# Patient Record
Sex: Male | Born: 1940 | Race: White | Hispanic: No | Marital: Married | Smoking: Never smoker
Health system: Southern US, Community
[De-identification: ages and names within clinical notes are randomized; demographics above are authoritative.]

## PROBLEM LIST (undated history)

## (undated) DIAGNOSIS — E119 Type 2 diabetes mellitus without complications: Secondary | ICD-10-CM

## (undated) DIAGNOSIS — N400 Enlarged prostate without lower urinary tract symptoms: Secondary | ICD-10-CM

## (undated) DIAGNOSIS — R519 Headache, unspecified: Secondary | ICD-10-CM

## (undated) DIAGNOSIS — Z9109 Other allergy status, other than to drugs and biological substances: Secondary | ICD-10-CM

## (undated) DIAGNOSIS — E039 Hypothyroidism, unspecified: Secondary | ICD-10-CM

## (undated) DIAGNOSIS — E785 Hyperlipidemia, unspecified: Secondary | ICD-10-CM

## (undated) DIAGNOSIS — M1990 Unspecified osteoarthritis, unspecified site: Secondary | ICD-10-CM

## (undated) DIAGNOSIS — R51 Headache: Secondary | ICD-10-CM

## (undated) DIAGNOSIS — N390 Urinary tract infection, site not specified: Secondary | ICD-10-CM

## (undated) HISTORY — PX: CHOLECYSTECTOMY: SHX55

## (undated) HISTORY — DX: Other allergy status, other than to drugs and biological substances: Z91.09

## (undated) HISTORY — DX: Headache: R51

## (undated) HISTORY — DX: Type 2 diabetes mellitus without complications: E11.9

## (undated) HISTORY — PX: CARDIAC CATHETERIZATION: SHX172

## (undated) HISTORY — DX: Benign prostatic hyperplasia without lower urinary tract symptoms: N40.0

## (undated) HISTORY — DX: Headache, unspecified: R51.9

## (undated) HISTORY — DX: Hyperlipidemia, unspecified: E78.5

---

## 1999-10-28 ENCOUNTER — Other Ambulatory Visit: Admission: RE | Admit: 1999-10-28 | Discharge: 1999-10-28 | Payer: Self-pay | Admitting: Otolaryngology

## 2002-04-21 ENCOUNTER — Ambulatory Visit (HOSPITAL_COMMUNITY): Admission: RE | Admit: 2002-04-21 | Discharge: 2002-04-21 | Payer: Self-pay | Admitting: Cardiology

## 2011-09-08 ENCOUNTER — Other Ambulatory Visit: Payer: Self-pay | Admitting: Interventional Cardiology

## 2011-09-11 ENCOUNTER — Inpatient Hospital Stay (HOSPITAL_BASED_OUTPATIENT_CLINIC_OR_DEPARTMENT_OTHER)
Admission: RE | Admit: 2011-09-11 | Discharge: 2011-09-11 | Disposition: A | Discharge status: Home / Self Care | Payer: Medicare Other | Source: Ambulatory Visit | Attending: Interventional Cardiology | Admitting: Interventional Cardiology

## 2011-09-11 ENCOUNTER — Encounter (HOSPITAL_BASED_OUTPATIENT_CLINIC_OR_DEPARTMENT_OTHER)
Admission: RE | Disposition: A | Discharge status: Home / Self Care | Payer: Self-pay | Source: Ambulatory Visit | Attending: Interventional Cardiology

## 2011-09-11 DIAGNOSIS — R079 Chest pain, unspecified: Secondary | ICD-10-CM | POA: Insufficient documentation

## 2011-09-11 DIAGNOSIS — I2510 Atherosclerotic heart disease of native coronary artery without angina pectoris: Secondary | ICD-10-CM | POA: Insufficient documentation

## 2011-09-11 DIAGNOSIS — E119 Type 2 diabetes mellitus without complications: Secondary | ICD-10-CM | POA: Insufficient documentation

## 2011-09-11 SURGERY — JV LEFT HEART CATHETERIZATION WITH CORONARY ANGIOGRAM
Anesthesia: Moderate Sedation

## 2011-09-11 MED ORDER — SODIUM CHLORIDE 0.9 % IJ SOLN: 3.0000 mL | Freq: Two times a day (BID) | INTRAMUSCULAR | Status: DC

## 2011-09-11 MED ORDER — SODIUM CHLORIDE 0.9 % IJ SOLN: 3.0000 mL | INTRAMUSCULAR | Status: DC | PRN

## 2011-09-11 MED ORDER — ACETAMINOPHEN 325 MG PO TABS: 650.0000 mg | ORAL_TABLET | ORAL | Status: DC | PRN

## 2011-09-11 MED ORDER — SODIUM CHLORIDE 0.9 % IV SOLN: 250.0000 mL | INTRAVENOUS | Status: DC | PRN

## 2011-09-11 MED ORDER — SODIUM CHLORIDE 0.9 % IV SOLN: INTRAVENOUS | Status: AC

## 2011-09-11 MED ORDER — DIAZEPAM 5 MG PO TABS
5.0000 mg | ORAL_TABLET | ORAL | Status: AC
  Administered 2011-09-11: 5 mg via ORAL

## 2011-09-11 MED ORDER — ONDANSETRON HCL 4 MG/2ML IJ SOLN: 4.0000 mg | Freq: Four times a day (QID) | INTRAMUSCULAR | Status: DC | PRN

## 2011-09-11 MED ORDER — ASPIRIN 81 MG PO CHEW
324.0000 mg | CHEWABLE_TABLET | ORAL | Status: AC
  Administered 2011-09-11: 324 mg via ORAL

## 2011-09-11 MED ORDER — MORPHINE SULFATE 2 MG/ML IJ SOLN: 1.0000 mg | INTRAMUSCULAR | Status: DC | PRN

## 2011-09-11 MED ORDER — ASPIRIN 81 MG PO CHEW: 81.0000 mg | CHEWABLE_TABLET | Freq: Every day | ORAL | Status: DC

## 2011-09-11 MED ORDER — SODIUM CHLORIDE 0.9 % IV SOLN
INTRAVENOUS | Status: DC
  Administered 2011-09-11: 08:00:00 via INTRAVENOUS

## 2011-09-11 NOTE — H&P (Signed)
  Date of Initial H&P:09/01/11  History reviewed, patient examined, no change in status, stable for surgery.

## 2011-09-11 NOTE — Progress Notes (Signed)
Discharge instructions completed, ambulated to bathroom without bleeding from right groin site, discharged to home via wheelchair with wife. 

## 2011-09-11 NOTE — Progress Notes (Signed)
Bedrest begins @ 0920, Dr. Eldridge Dace in to discuss results with patient and wife.

## 2011-09-11 NOTE — CV Procedure (Signed)
PROCEDURE:  Left heart catheterization with selective coronary angiography, left ventriculogram.  Abdominal aortogram.  INDICATIONS:  Persistent chest pain.  Diabetes.  The risks, benefits, and details of the procedure were explained to the patient.  The patient verbalized understanding and wanted to proceed.  Informed written consent was obtained.  PROCEDURE TECHNIQUE:  After Xylocaine anesthesia a 38F sheath was placed in the right femoral artery with a single anterior needle wall stick.   Left coronary angiography was done using a Judkins L4 guide catheter.  Right coronary angiography was done using a 3 [facility] guide catheter.  Left ventriculography and abdominal aortogram were done using a pigtail catheter.    CONTRAST:  Total of 90 cc.  COMPLICATIONS:  None.    HEMODYNAMICS:  Aortic pressure was 119/72; LV pressure was 117/16; LVEDP 19.  There was no gradient between the left ventricle and aorta.    ANGIOGRAPHIC DATA:   The left main coronary artery is widely patent.  The left anterior descending artery is a large vessel which wraps around the apex.  There is mild atherosclerosis in the proximal to midportion.  The first diagonal is a medium-sized vessel and widely patent.  The second diagonal is small and patent.  The left circumflex artery is a medium-sized vessel.  There is a small OM1 which is patent.  The OM 2 is medium-sized which is widely patent.  There are 3 additional very small OM's coming off the distal circumflex which is fairly small.  There are only mild luminal irregularities in the circumflex.  The right coronary artery is a large dominant vessel.  There are mild luminal irregularities in the midportion the vessel.  The PDA is small but patent.  The posterior lateral artery is a larger vessel supplying the lateral wall.  There is no significant disease in the right coronary system.  LEFT VENTRICULOGRAM:  Left ventricular angiogram was done in the 30 RAO projection and  revealed normal left ventricular wall motion and systolic function with an estimated ejection fraction of 60 %.  LVEDP was 19 mmHg.  IMPRESSIONS:  1. Normal left main coronary artery. 2. Minimal atherosclerosis in the left anterior descending artery and its branches. 3. Minimal atherosclerosis in the left circumflex artery and its branches. 4. Minimal atherosclerosis in the right coronary artery. 5. Normal left ventricular systolic function.  LVEDP 19 mmHg.  Ejection fraction 60 %.  RECOMMENDATION:  No significant coronary artery disease.  Continue aggressive preventive therapy.  He will benefit from weight loss and continued aggressive diabetes control.

## 2011-09-12 LAB — POCT I-STAT GLUCOSE
Glucose, Bld: 207 mg/dL — ABNORMAL HIGH (ref 70–99)
Operator id: 118031

## 2013-11-14 DIAGNOSIS — B0230 Zoster ocular disease, unspecified: Secondary | ICD-10-CM | POA: Insufficient documentation

## 2013-12-13 ENCOUNTER — Ambulatory Visit: Payer: Self-pay | Admitting: Family Medicine

## 2014-02-17 DIAGNOSIS — J302 Other seasonal allergic rhinitis: Secondary | ICD-10-CM | POA: Insufficient documentation

## 2014-11-22 ENCOUNTER — Encounter: Payer: Self-pay | Admitting: *Deleted

## 2015-02-08 ENCOUNTER — Other Ambulatory Visit: Payer: Self-pay | Admitting: Unknown Physician Specialty

## 2015-02-08 DIAGNOSIS — M25561 Pain in right knee: Secondary | ICD-10-CM

## 2015-02-08 DIAGNOSIS — M1711 Unilateral primary osteoarthritis, right knee: Secondary | ICD-10-CM

## 2015-02-15 ENCOUNTER — Ambulatory Visit
Admission: RE | Admit: 2015-02-15 | Discharge: 2015-02-15 | Disposition: A | Discharge status: Home / Self Care | Payer: Medicare Other | Source: Ambulatory Visit | Attending: Unknown Physician Specialty | Admitting: Unknown Physician Specialty

## 2015-02-15 DIAGNOSIS — M1711 Unilateral primary osteoarthritis, right knee: Secondary | ICD-10-CM | POA: Insufficient documentation

## 2015-02-15 DIAGNOSIS — M659 Synovitis and tenosynovitis, unspecified: Secondary | ICD-10-CM | POA: Insufficient documentation

## 2015-02-15 DIAGNOSIS — S83241A Other tear of medial meniscus, current injury, right knee, initial encounter: Secondary | ICD-10-CM | POA: Insufficient documentation

## 2015-02-15 DIAGNOSIS — M25461 Effusion, right knee: Secondary | ICD-10-CM | POA: Diagnosis not present

## 2015-02-15 DIAGNOSIS — M25561 Pain in right knee: Secondary | ICD-10-CM

## 2015-03-08 DIAGNOSIS — S83209A Unspecified tear of unspecified meniscus, current injury, unspecified knee, initial encounter: Secondary | ICD-10-CM | POA: Insufficient documentation

## 2015-03-09 NOTE — Anesthesia Preprocedure Evaluation (Addendum)
Anesthesia Evaluation    Airway Mallampati: II  TM Distance: >3 FB Neck ROM: Full    Dental no notable dental hx.    Pulmonary  breath sounds clear to auscultation  Pulmonary exam normal       Cardiovascular Normal cardiovascular examRhythm:Regular Rate:Normal  Hyperlipidemia  Left heart cath 2013:  1.   Normal left main coronary artery. 2.   Minimal atherosclerosis in the left anterior descending artery and its branches. 3.   Minimal atherosclerosis in the left circumflex artery and its branches. 4.   Minimal atherosclerosis in the right coronary artery. 5.   Normal left ventricular systolic function. LVEDP 19 mmHg. Ejection fraction 60 %.    Neuro/Psych  Headaches,    GI/Hepatic   Endo/Other  diabetes, Type 2  Renal/GU      Musculoskeletal  (+) Arthritis -,   Abdominal   Peds  Hematology   Anesthesia Other Findings   Reproductive/Obstetrics                           Anesthesia Physical Anesthesia Plan  ASA: II  Anesthesia Plan: General   Post-op Pain Management:    Induction: Intravenous  Airway Management Planned:   Additional Equipment:   Intra-op Plan:   Post-operative Plan: Extubation in OR  Informed Consent: I have reviewed the patients History and Physical, chart, labs and discussed the procedure including the risks, benefits and alternatives for the proposed anesthesia with the patient or authorized representative who has indicated his/her understanding and acceptance.   Dental advisory given  Plan Discussed with: CRNA  Anesthesia Plan Comments:         Anesthesia Quick Evaluation

## 2015-03-16 ENCOUNTER — Encounter: Payer: Self-pay | Admitting: *Deleted

## 2015-03-16 ENCOUNTER — Ambulatory Visit: Payer: Medicare Other | Admitting: Anesthesiology

## 2015-03-16 ENCOUNTER — Encounter
Admission: RE | Disposition: A | Discharge status: Home / Self Care | Payer: Self-pay | Source: Ambulatory Visit | Attending: Unknown Physician Specialty

## 2015-03-16 ENCOUNTER — Ambulatory Visit
Admission: RE | Admit: 2015-03-16 | Discharge: 2015-03-16 | Disposition: A | Discharge status: Home / Self Care | Payer: Medicare Other | Source: Ambulatory Visit | Attending: Unknown Physician Specialty | Admitting: Unknown Physician Specialty

## 2015-03-16 DIAGNOSIS — Z86010 Personal history of colonic polyps: Secondary | ICD-10-CM | POA: Insufficient documentation

## 2015-03-16 DIAGNOSIS — Z7982 Long term (current) use of aspirin: Secondary | ICD-10-CM | POA: Insufficient documentation

## 2015-03-16 DIAGNOSIS — K589 Irritable bowel syndrome without diarrhea: Secondary | ICD-10-CM | POA: Diagnosis not present

## 2015-03-16 DIAGNOSIS — I7090 Unspecified atherosclerosis: Secondary | ICD-10-CM | POA: Insufficient documentation

## 2015-03-16 DIAGNOSIS — Z803 Family history of malignant neoplasm of breast: Secondary | ICD-10-CM | POA: Diagnosis not present

## 2015-03-16 DIAGNOSIS — R51 Headache: Secondary | ICD-10-CM | POA: Insufficient documentation

## 2015-03-16 DIAGNOSIS — E119 Type 2 diabetes mellitus without complications: Secondary | ICD-10-CM | POA: Diagnosis not present

## 2015-03-16 DIAGNOSIS — M25561 Pain in right knee: Secondary | ICD-10-CM | POA: Diagnosis present

## 2015-03-16 DIAGNOSIS — Z9049 Acquired absence of other specified parts of digestive tract: Secondary | ICD-10-CM | POA: Insufficient documentation

## 2015-03-16 DIAGNOSIS — E785 Hyperlipidemia, unspecified: Secondary | ICD-10-CM | POA: Insufficient documentation

## 2015-03-16 DIAGNOSIS — Z79899 Other long term (current) drug therapy: Secondary | ICD-10-CM | POA: Diagnosis not present

## 2015-03-16 DIAGNOSIS — M23231 Derangement of other medial meniscus due to old tear or injury, right knee: Secondary | ICD-10-CM | POA: Insufficient documentation

## 2015-03-16 DIAGNOSIS — Z833 Family history of diabetes mellitus: Secondary | ICD-10-CM | POA: Insufficient documentation

## 2015-03-16 DIAGNOSIS — Z841 Family history of disorders of kidney and ureter: Secondary | ICD-10-CM | POA: Insufficient documentation

## 2015-03-16 DIAGNOSIS — M1990 Unspecified osteoarthritis, unspecified site: Secondary | ICD-10-CM | POA: Insufficient documentation

## 2015-03-16 DIAGNOSIS — J309 Allergic rhinitis, unspecified: Secondary | ICD-10-CM | POA: Insufficient documentation

## 2015-03-16 HISTORY — PX: KNEE ARTHROSCOPY: SHX127

## 2015-03-16 HISTORY — DX: Unspecified osteoarthritis, unspecified site: M19.90

## 2015-03-16 LAB — GLUCOSE, CAPILLARY
Glucose-Capillary: 132 mg/dL — ABNORMAL HIGH (ref 65–99)
Glucose-Capillary: 140 mg/dL — ABNORMAL HIGH (ref 65–99)

## 2015-03-16 SURGERY — ARTHROSCOPY, KNEE
Anesthesia: General | Laterality: Right | Wound class: Clean

## 2015-03-16 MED ORDER — LACTATED RINGERS IR SOLN
Status: DC | PRN
  Administered 2015-03-16: 7100 mL

## 2015-03-16 MED ORDER — FENTANYL CITRATE (PF) 100 MCG/2ML IJ SOLN
INTRAMUSCULAR | Status: DC | PRN
  Administered 2015-03-16 (×5): 25 ug via INTRAVENOUS
  Administered 2015-03-16: 50 ug via INTRAVENOUS

## 2015-03-16 MED ORDER — OXYCODONE HCL 5 MG PO TABS: 5.0000 mg | ORAL_TABLET | Freq: Once | ORAL | Status: DC | PRN

## 2015-03-16 MED ORDER — LIDOCAINE HCL (CARDIAC) 20 MG/ML IV SOLN
INTRAVENOUS | Status: DC | PRN
  Administered 2015-03-16: 50 mg via INTRATRACHEAL

## 2015-03-16 MED ORDER — NORCO 5-325 MG PO TABS: 1.0000 | ORAL_TABLET | Freq: Four times a day (QID) | ORAL | Status: DC | PRN

## 2015-03-16 MED ORDER — DEXAMETHASONE SODIUM PHOSPHATE 4 MG/ML IJ SOLN
INTRAMUSCULAR | Status: DC | PRN
  Administered 2015-03-16: 4 mg via INTRAVENOUS

## 2015-03-16 MED ORDER — HYDROMORPHONE HCL 1 MG/ML IJ SOLN: 0.2500 mg | INTRAMUSCULAR | Status: DC | PRN

## 2015-03-16 MED ORDER — PROPOFOL 10 MG/ML IV BOLUS
INTRAVENOUS | Status: DC | PRN
  Administered 2015-03-16: 130 mg via INTRAVENOUS

## 2015-03-16 MED ORDER — MIDAZOLAM HCL 5 MG/5ML IJ SOLN
INTRAMUSCULAR | Status: DC | PRN
  Administered 2015-03-16: 2 mg via INTRAVENOUS

## 2015-03-16 MED ORDER — ONDANSETRON HCL 4 MG/2ML IJ SOLN
INTRAMUSCULAR | Status: DC | PRN
  Administered 2015-03-16: 4 mg via INTRAVENOUS

## 2015-03-16 MED ORDER — OXYCODONE HCL 5 MG/5ML PO SOLN: 5.0000 mg | Freq: Once | ORAL | Status: DC | PRN

## 2015-03-16 MED ORDER — GLYCOPYRROLATE 0.2 MG/ML IJ SOLN
INTRAMUSCULAR | Status: DC | PRN
  Administered 2015-03-16: .1 mg via INTRAVENOUS

## 2015-03-16 MED ORDER — BUPIVACAINE HCL (PF) 0.5 % IJ SOLN
INTRAMUSCULAR | Status: DC | PRN
  Administered 2015-03-16: 20 mL

## 2015-03-16 MED ORDER — LACTATED RINGERS IV SOLN
INTRAVENOUS | Status: DC
  Administered 2015-03-16: 09:00:00 via INTRAVENOUS

## 2015-03-16 SURGICAL SUPPLY — 41 items
ARTHROWAND PARAGON T2 (SURGICAL WAND) ×3
BLADE ABRADER 4.5 (BLADE) ×2 IMPLANT
BLADE SHAVER 4.5X7 STR FR (MISCELLANEOUS) ×2 IMPLANT
BUR RADIUS 3.5 (BURR) IMPLANT
BUR RADIUS 4.0X18.5 (BURR) IMPLANT
BUR ROUND 5.5 (BURR) IMPLANT
BURR ROUND 12 FLUTE 4.0MM (BURR) IMPLANT
COVER LIGHT HANDLE FLEXIBLE (MISCELLANEOUS) ×3 IMPLANT
CUFF TOURN SGL QUICK 24 (TOURNIQUET CUFF) ×3
CUFF TOURN SGL QUICK 30 (MISCELLANEOUS)
CUFF TOURN SGL QUICK 34 (TOURNIQUET CUFF)
CUFF TRNQT CYL 24X4X40X1 (TOURNIQUET CUFF) IMPLANT
CUFF TRNQT CYL 34X4X40X1 (TOURNIQUET CUFF) IMPLANT
CUFF TRNQT CYL LO 30X4X (MISCELLANEOUS) IMPLANT
CUTTER SLOTTED WHISKER 4.0 (BURR) IMPLANT
DRAPE LEGGINS SURG 28X43 STRL (DRAPES) ×3 IMPLANT
DURAPREP 26ML APPLICATOR (WOUND CARE) ×3 IMPLANT
GAUZE SPONGE 4X4 12PLY STRL (GAUZE/BANDAGES/DRESSINGS) ×3 IMPLANT
GLOVE BIO SURGEON STRL SZ7.5 (GLOVE) ×5 IMPLANT
GLOVE BIO SURGEON STRL SZ8 (GLOVE) ×3 IMPLANT
GLOVE INDICATOR 8.0 STRL GRN (GLOVE) ×5 IMPLANT
GOWN STRL REIN 2XL XLG LVL4 (GOWN DISPOSABLE) ×3 IMPLANT
GOWN STRL REUS W/TWL 2XL LVL3 (GOWN DISPOSABLE) ×3 IMPLANT
IV LACTATED RINGER IRRG 3000ML (IV SOLUTION) ×6
IV LR IRRIG 3000ML ARTHROMATIC (IV SOLUTION) ×2 IMPLANT
MANIFOLD 4PT FOR NEPTUNE1 (MISCELLANEOUS) ×3 IMPLANT
PACK ARTHROSCOPY KNEE (MISCELLANEOUS) ×3 IMPLANT
SET TUBE SUCT SHAVER OUTFL 24K (TUBING) ×3 IMPLANT
SOL PREP PVP 2OZ (MISCELLANEOUS) ×3
SOLUTION PREP PVP 2OZ (MISCELLANEOUS) ×1 IMPLANT
SUT ETHILON 3-0 FS-10 30 BLK (SUTURE) ×3
SUTURE EHLN 3-0 FS-10 30 BLK (SUTURE) ×1 IMPLANT
TAPE MICROFOAM 4IN (TAPE) ×3 IMPLANT
TUBING ARTHRO INFLOW-ONLY STRL (TUBING) ×3 IMPLANT
WAND ARTHRO PARAGON T2 (SURGICAL WAND) IMPLANT
WAND COVAC 50 IFS (MISCELLANEOUS) IMPLANT
WAND HAND CNTRL MULTIVAC 50 (MISCELLANEOUS) ×2 IMPLANT
WAND HAND CNTRL MULTIVAC 90 (MISCELLANEOUS) IMPLANT
WAND MEGAVAC 90 (MISCELLANEOUS) IMPLANT
WAND ULTRAVAC 90 (MISCELLANEOUS) IMPLANT
WRAP KNEE W/COLD PACKS 25.5X14 (SOFTGOODS) ×2 IMPLANT

## 2015-03-16 NOTE — Discharge Instructions (Signed)
General Anesthesia, Care After °Refer to this sheet in the next few weeks. These instructions provide you with information on caring for yourself after your procedure. Your health care provider may also give you more specific instructions. Your treatment has been planned according to current medical practices, but problems sometimes occur. Call your health care provider if you have any problems or questions after your procedure. °WHAT TO EXPECT AFTER THE PROCEDURE °After the procedure, it is typical to experience: °· Sleepiness. °· Nausea and vomiting. °HOME CARE INSTRUCTIONS °· For the first 24 hours after general anesthesia: °¨ Have a responsible person with you. °¨ Do not drive a car. If you are alone, do not take public transportation. °¨ Do not drink alcohol. °¨ Do not take medicine that has not been prescribed by your health care provider. °¨ Do not sign important papers or make important decisions. °¨ You may resume a normal diet and activities as directed by your health care provider. °· Change bandages (dressings) as directed. °· If you have questions or problems that seem related to general anesthesia, call the hospital and ask for the anesthetist or anesthesiologist on call. °SEEK MEDICAL CARE IF: °· You have nausea and vomiting that continue the day after anesthesia. °· You develop a rash. °SEEK IMMEDIATE MEDICAL CARE IF:  °· You have difficulty breathing. °· You have chest pain. °· You have any allergic problems. °Document Released: 10/06/2000 Document Revised: 07/05/2013 Document Reviewed: 01/13/2013 °ExitCare® Patient Information ©2015 [employer]. This information is not intended to replace advice given to you by your health care provider. Make sure you discuss any questions you have with your health care provider. ° ° °[facility]  °Alizzon Dioguardi B. Kregg Cihlar, Jr., M.D. [phone]  ° °KNEE ARTHROSCOPY POST OPERATION INSTRUCTIONS: ° °PLEASE READ THESE INSTRUCTIONS  ABOUT POST OPERATION CARE. THEY WILL ANSWER MOST OF YOUR QUESTIONS.  °You have been given a prescription for pain. Please take as directed for pain.  °You can walk, keeping the knee slightly stiff-avoid doing too much bending the first day. (if ACL reconstruction is performed, keep brace locked in extension when walking.)  °You will use crutches or cane if needed. Can weight bear as tolerated  °Plan to take three to four days off from work. You can resume work when you are comfortable. (This can be a week or more, depending on the type of work you do.)  °To reduce pain and swelling, place one to two pillows under the knee the first two or three days when sitting or lying. An ice pack may be placed on top of the area over the dressing. Instructions for making homemade icepack are as follow:  °Flexible homemade alcohol water ice pack  °2 cups water  °1 cup rubbing alcohol  °food coloring for the blue tint (optional)  °2 zip-top bags - gallon-size  °Mix the water and alcohol together in one of your zip-top bags and add food coloring. Release as much air as possible and seal the bag. Place in freezer for at least 12 hours.  °The small incisions in your knee are closed with nylon stitches. They will be removed in the office.  °The bulky dressing may be removed in the third day after surgery. (If ACL surgery-DO NOT REMOVE BANDAGES). Put a waterproof band-aid over each stitch. Do not put any creams or ointments on wounds. You may shower at this time, but change waterproof band-aids after showering. KEEP INCISIONS CLEAN AND DRY UNTIL YOU RETURN TO   THE OFFICE.  °Sometimes the operative area remains somewhat painful and swollen for several weeks. This is usually nothing to worry about, but call if you have any excessive symptoms, especially fever. It is not unusual to have a low grade fever of 99 degrees for the first few days. If persist after 3-4 days call the office. It is not uncommon for the pain to be a little worse on  the third day after surgery.  °Begin doing gentle exercises right away. They will be limited by the amount of pain and swelling you have.  Exercising will reduce the swelling, increase motion, and prevent muscle weakness. Exercises: Straight leg raising and gentle knee bending.  °Take 81 milligram aspirin twice a day for 2 weeks after meals or milk. This along with elevation will help reduce the possibility of phlebitis in your operated leg.  °Avoid strenuous athletics for a minimum of 4 to 6 weeks after arthroscopic surgery (approximately five months if ACL surgery).  °If the surgery included ACL reconstruction the brace that is supplied to the extremity post surgery is to be locked in extension when you are asleep and is to be locked in extension when you are ambulating. It can be unlocked for exercises or sitting.  °Keep your post surgery appointment that has been made for you. If you do not remember the date call [phone]. Your follow up appointment should be between 7-10 days.  ° °

## 2015-03-16 NOTE — Transfer of Care (Signed)
Immediate Anesthesia Transfer of Care Note  Patient: Philip Robbins  Procedure(s) Performed: Procedure(s) with comments: ARTHROSCOPY KNEE WITH PARTIAL MEDIAL MENISECTOMY, AND CHONDROPLASTY (Right) - Diabetic - oral meds  Patient Location: PACU  Anesthesia Type: General  Level of Consciousness: awake, alert  and patient cooperative  Airway and Oxygen Therapy: Patient Spontanous Breathing and Patient connected to supplemental oxygen  Post-op Assessment: Post-op Vital signs reviewed, Patient's Cardiovascular Status Stable, Respiratory Function Stable, Patent Airway and No signs of Nausea or vomiting  Post-op Vital Signs: Reviewed and stable  Complications: No apparent anesthesia complications

## 2015-03-16 NOTE — Op Note (Signed)
Patient: Philip Robbins  Preoperative diagnosis: Torn medial meniscus right knee plus medial compartment chondral changes  Postop diagnosis: Same  Operation: Arthroscopic partial medial meniscectomy plus debridement and Coblation of medial femoral chondral lesion  Surgeon: Vilinda Flake, MD  Anesthesia: Gen.   History: Patient's had a long history of right knee pain.  The plain films revealed mild narrowing of the medial compartment .  The patient had an MRI which revealed torn medial meniscus and medial compartment chondral changes.The patient was scheduled for surgery due to persistent discomfort despite conservative treatment.  The patient was taken the operating room where satisfactory general anesthesia was achieved. A tourniquet and leg holder were was applied to the right thigh. The right lower extremity was supported with a well leg holder. The right knee was prepped and draped in usual fashion for an arthroscopic procedure. An inflow cannula was introduced superomedially. The joint was distended with lactated Ringer's. Scope was introduced through an inferolateral puncture wound and a probe through an inferomedial puncture wound. Inspection of the medial compartment revealed  ruptured bucket handle tear of the medial meniscus along with a grade 3 medial femoral chondral lesion. It measured about 1.5 to 2 cm in diameter. I went ahead and excised the ruptured bucket handle tear of the medial meniscus and then contoured the remaining rim with a motorized resector and an angled ArthroCare wand. I coblated the medial femoral chondral lesion with an ArthroCare [place]agon wand. Inspection of the intercondylar notch revealed intact cruciates. Inspection of the the lateral compartment revealed no chondral or meniscal pathology.   Trochlear groove was inspected and appeared to be fairly smooth.  Retropatellar surface was fairly smooth. The patella seemed to track fairly well.  The instruments were  removed from the joint at this time. The puncture wounds were closed with 3-0 nylon in vertical mattress fashion. I injected each puncture wound with several cc of half percent Marcaine without epinephrine. Betadine was applied the wounds followed by sterile dressing. An ice pack was applied to the right knee. The patient was awakened and transferred to the stretcher bed. The patient was taken to the recovery room in satisfactory condition.  The tourniquet was not inflated during the course of the procedure. Blood loss was negligible.

## 2015-03-16 NOTE — Anesthesia Procedure Notes (Signed)
Procedure Name: LMA Insertion Date/Time: 03/16/2015 10:17 AM Performed by: Mayme Genta Pre-anesthesia Checklist: Patient identified, Emergency Drugs available, Suction available, Timeout performed and Patient being monitored Patient Re-evaluated:Patient Re-evaluated prior to inductionOxygen Delivery Method: Circle system utilized Preoxygenation: Pre-oxygenation with 100% oxygen Intubation Type: IV induction LMA: LMA inserted LMA Size: 4.0 Number of attempts: 1 Placement Confirmation: positive ETCO2 and breath sounds checked- equal and bilateral Tube secured with: Tape

## 2015-03-16 NOTE — Anesthesia Postprocedure Evaluation (Signed)
  Anesthesia Post-op Note  Patient: Philip Robbins  Procedure(s) Performed: Procedure(s) with comments: ARTHROSCOPY KNEE WITH PARTIAL MEDIAL MENISECTOMY, AND CHONDROPLASTY (Right) - Diabetic - oral meds  Anesthesia type:General  Patient location: PACU  Post pain: Pain level controlled  Post assessment: Post-op Vital signs reviewed, Patient's Cardiovascular Status Stable, Respiratory Function Stable, Patent Airway and No signs of Nausea or vomiting  Post vital signs: Reviewed and stable  Last Vitals:  Filed Vitals:   03/16/15 1155  BP:   Pulse: 69  Temp:   Resp: 16    Level of consciousness: awake, alert  and patient cooperative  Complications: No apparent anesthesia complications

## 2015-03-16 NOTE — H&P (Signed)
  H and P reviewed. No changes. Uploaded at later date. 

## 2015-03-20 ENCOUNTER — Encounter: Payer: Self-pay | Admitting: Unknown Physician Specialty

## 2015-04-10 DIAGNOSIS — Z9889 Other specified postprocedural states: Secondary | ICD-10-CM | POA: Insufficient documentation

## 2015-05-04 ENCOUNTER — Encounter: Payer: Self-pay | Admitting: Hematology and Oncology

## 2015-05-04 ENCOUNTER — Inpatient Hospital Stay: Payer: Medicare Other

## 2015-05-04 ENCOUNTER — Inpatient Hospital Stay: Payer: Medicare Other | Attending: Hematology and Oncology | Admitting: Hematology and Oncology

## 2015-05-04 ENCOUNTER — Other Ambulatory Visit: Payer: Self-pay

## 2015-05-04 VITALS — BP 111/72 | HR 82 | Temp 96.1°F | Resp 18 | Ht 67.5 in | Wt 223.3 lb

## 2015-05-04 DIAGNOSIS — N400 Enlarged prostate without lower urinary tract symptoms: Secondary | ICD-10-CM | POA: Diagnosis not present

## 2015-05-04 DIAGNOSIS — C9110 Chronic lymphocytic leukemia of B-cell type not having achieved remission: Secondary | ICD-10-CM | POA: Insufficient documentation

## 2015-05-04 DIAGNOSIS — D72820 Lymphocytosis (symptomatic): Secondary | ICD-10-CM

## 2015-05-04 DIAGNOSIS — M129 Arthropathy, unspecified: Secondary | ICD-10-CM | POA: Insufficient documentation

## 2015-05-04 DIAGNOSIS — R61 Generalized hyperhidrosis: Secondary | ICD-10-CM | POA: Diagnosis not present

## 2015-05-04 DIAGNOSIS — K589 Irritable bowel syndrome without diarrhea: Secondary | ICD-10-CM | POA: Diagnosis not present

## 2015-05-04 DIAGNOSIS — E785 Hyperlipidemia, unspecified: Secondary | ICD-10-CM

## 2015-05-04 DIAGNOSIS — Z7982 Long term (current) use of aspirin: Secondary | ICD-10-CM | POA: Insufficient documentation

## 2015-05-04 DIAGNOSIS — Z79899 Other long term (current) drug therapy: Secondary | ICD-10-CM | POA: Diagnosis not present

## 2015-05-04 DIAGNOSIS — J069 Acute upper respiratory infection, unspecified: Secondary | ICD-10-CM | POA: Insufficient documentation

## 2015-05-04 DIAGNOSIS — E119 Type 2 diabetes mellitus without complications: Secondary | ICD-10-CM

## 2015-05-04 DIAGNOSIS — D72829 Elevated white blood cell count, unspecified: Secondary | ICD-10-CM | POA: Insufficient documentation

## 2015-05-04 LAB — CBC WITH DIFFERENTIAL/PLATELET
Basophils Absolute: 0.2 10*3/uL — ABNORMAL HIGH (ref 0–0.1)
Basophils Relative: 1 %
Eosinophils Absolute: 0.6 10*3/uL (ref 0–0.7)
Eosinophils Relative: 2 %
HCT: 46.4 % (ref 40.0–52.0)
Hemoglobin: 15.3 g/dL (ref 13.0–18.0)
Lymphocytes Relative: 53 %
Lymphs Abs: 12.6 10*3/uL — ABNORMAL HIGH (ref 1.0–3.6)
MCH: 29.7 pg (ref 26.0–34.0)
MCHC: 32.9 g/dL (ref 32.0–36.0)
MCV: 90.1 fL (ref 80.0–100.0)
Monocytes Absolute: 1.3 10*3/uL — ABNORMAL HIGH (ref 0.2–1.0)
Monocytes Relative: 6 %
Neutro Abs: 9 10*3/uL — ABNORMAL HIGH (ref 1.4–6.5)
Neutrophils Relative %: 38 %
Platelets: 273 10*3/uL (ref 150–440)
RBC: 5.14 MIL/uL (ref 4.40–5.90)
RDW: 14.6 % — ABNORMAL HIGH (ref 11.5–14.5)
WBC: 23.7 10*3/uL — ABNORMAL HIGH (ref 3.8–10.6)

## 2015-05-04 LAB — SEDIMENTATION RATE: Sed Rate: 4 mm/hr (ref 0–20)

## 2015-05-04 NOTE — Progress Notes (Signed)
Patient is referred here by Dr. Ellison Hughs for leukocytosis. Patient states that overall he feels good. 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 He mentioned this to Dr. Ellison Hughs and he checked his WBC which was elevated. He told patient that he thought he should get it checked out, so he sent him to see Dr. Mike Gip. Patient denies any fevers, chills, night sweats, or recent infections.

## 2015-05-05 NOTE — Progress Notes (Signed)
[facility] day:  05/04/2015  Chief Complaint: Philip Robbins is a 74 y.o. male with leukocytosis who is referred in consultation by Dr. Ellison Hughs.  HPI: Patient states that he has been followed by Dr. Ellison Hughs for the last 2-3 years. Labs are drawn a regular basis secondary to his diabetes. Symptomatically, he notes some arthritis pain in his knee.  He had meniscus surgery about 2 months ago. He has some mild allergy and sinus symptoms.  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 He did not feel lightheaded or dizzy. He had labs drawn the following week. Labs on 04/11/2015 revealed a hematocrit of 48.3, hemoglobin 15.5, MCV 93.1, platelets 331,000, white count 29,300 with an [place] of 16,780. Differential included 57% segs and 39% lymphs. Labs on 04/16/2015 revealed a hematocrit of 44.8, hemoglobin 15, MCV 92.8, platelets 276,000, white count 28,700, and ANC of 8600. Differential included 30% segs and 64% lymphs.  He denies any fever, sweats or weight loss. He denies any early satiety. He does note irritable bowel. He has not been on any steroids although notes a cortisone injection in the right knee in 01/2015. He has had no problems with sinus infections.  Past Medical History  Diagnosis Date  . DM type 2 (diabetes mellitus, type 2) ([place])   . HLD (hyperlipidemia)   . Environmental allergies   . BPH (benign prostatic hypertrophy)   . Headache     sinus headaches  . Arthritis     knee and back    Past Surgical History  Procedure Laterality Date  . Cardiac catheterization    . Cardiac catheterization      no stents placed  . Knee arthroscopy Right 03/16/2015    Procedure: ARTHROSCOPY KNEE WITH PARTIAL MEDIAL MENISECTOMY, AND CHONDROPLASTY;  Surgeon: Leanor Kail, MD;  Location: [facility];  Service: Orthopedics;  Laterality: Right;  Diabetic - oral meds     Family History  Problem Relation Age of Onset  . Renal Disease Father     ESRD  . Diabetes Father   . Breast cancer Sister     Social History:  reports that he has never smoked. He has never used smokeless tobacco. He reports that he does not drink alcohol or use illicit drugs.  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  Allergies: No Known Allergies  Current Medications: Current Outpatient Prescriptions  Medication Sig Dispense Refill  . acyclovir (ZOVIRAX) 200 MG capsule 400 mg once.    Marland Kitchen aspirin (ASPIRIN EC) 81 MG EC tablet Take 81 mg by mouth daily. Swallow whole.    Marland Kitchen azelastine (ASTELIN) 0.1 % nasal Ginther Place 1 Kush into both nostrils 2 (two) times daily. Use in each nostril as directed    . cetirizine (ZYRTEC) 10 MG tablet Take 10 mg by mouth daily.    . fluorometholone (FML) 0.1 % ophthalmic suspension Place 1 drop into both eyes 2 (two) times a week.    . fluticasone (FLONASE) 50 MCG/ACT nasal Essick Place 2 sprays into both nostrils daily.    Marland Kitchen lisinopril (PRINIVIL,ZESTRIL) 10 MG tablet Take 10 mg by mouth daily.    . meloxicam (MOBIC) 7.5 MG tablet Take 7.5 mg by mouth daily.    . metFORMIN (GLUCOPHAGE) 500 MG tablet Take by mouth 2 (two) times daily with a meal.    . NORCO 5-325 MG per  tablet Take 1-2 tablets by mouth every 6 (six) hours as needed for moderate pain. MAXIMUM TOTAL ACETAMINOPHEN DOSE IS 4000 MG PER DAY 25 tablet 0  . pravastatin (PRAVACHOL) 40 MG tablet Take 40 mg by mouth daily.    . Probiotic Product (PROBIOTIC ACIDOPHILUS) CAPS Take by mouth.    . sitaGLIPtin (JANUVIA) 50 MG tablet Take 50 mg by mouth daily.    . pioglitazone (ACTOS) 45 MG tablet Take 45 mg by mouth daily.     No current facility-administered medications for this visit.    Review of Systems:  GENERAL:  Energy all right.  Tired sometimes.  No fevers, sweats or weight loss. PERFORMANCE STATUS (ECOG):  0 HEENT:  Dry eyes corrected with drops.  No visual changes,  runny nose, sore throat, mouth sores or tenderness. Lungs: No shortness of breath or cough.  No hemoptysis. Cardiac:  No chest pain, palpitations, orthopnea, or PND. GI:  Irritable bowel.  Diarrhea, takes probiotics.  No nausea, vomiting, constipation, melena or hematochezia.  Colonoscopy 2-3 years ago.  Polyps removed.   GU:  No urgency, frequency, dysuria, or hematuria. Musculoskeletal:  No back pain.  No joint pain.  No muscle tenderness. Extremities:  No pain or swelling. Skin:  Bruises easily on left arm going in and out of door.  No rashes or skin changes. Neuro:  No headache, numbness or weakness, balance or coordination issues. Endocrine:  No diabetes, thyroid issues, hot flashes or night sweats. Psych:  No mood changes, depression or anxiety. Pain:  No focal pain. Review of systems:  All other systems reviewed and found to be negative.  Physical Exam: Blood pressure 111/72, pulse 82, temperature 96.1 F (35.6 C), temperature source Tympanic, resp. rate 18, height 5' 7.5" (1.715 m), weight 223 lb 5.2 oz (101.3 kg). GENERAL:  Well developed, well nourished, sitting comfortably in the exam room in no acute distress. MENTAL STATUS:  Alert and oriented to person, place and time. HEAD:  Short gray hair.  Normocephalic, atraumatic, face symmetric, no Cushingoid features. EYES:  Blue eyes.  Pupils equal round and reactive to light and accomodation.  No conjunctivitis or scleral icterus. ENT:  Oropharynx clear without lesion.  Tongue normal. Mucous membranes moist.  RESPIRATORY:  Clear to auscultation without rales, wheezes or rhonchi. CARDIOVASCULAR:  Regular rate and rhythm without murmur, rub or gallop. ABDOMEN:  Fully round.  Soft, non-tender, with active bowel sounds, and no hepatomegaly.  Spleen tip palpable.  No masses. SKIN:  Few small bruises left arm.  No rashes, ulcers or lesions. EXTREMITIES: No edema, no skin discoloration or tenderness.  No palpable cords. LYMPH NODES: 1.5-2  cm low left posterior cervical node.  No palpable supraclavicular, axillary or inguinal adenopathy  NEUROLOGICAL: Unremarkable. PSYCH:  Appropriate.  Appointment on 05/04/2015  Component Date Value Ref Range Status  . WBC 05/04/2015 23.7* 3.8 - 10.6 K/uL Final  . RBC 05/04/2015 5.14  4.40 - 5.90 MIL/uL Final  . Hemoglobin 05/04/2015 15.3  13.0 - 18.0 g/dL Final  . HCT 05/04/2015 46.4  40.0 - 52.0 % Final  . MCV 05/04/2015 90.1  80.0 - 100.0 fL Final  . MCH 05/04/2015 29.7  26.0 - 34.0 pg Final  . MCHC 05/04/2015 32.9  32.0 - 36.0 g/dL Final  . RDW 05/04/2015 14.6* 11.5 - 14.5 % Final  . Platelets 05/04/2015 273  150 - 440 K/uL Final  . Neutrophils Relative % 05/04/2015 38   Final  . Neutro Abs 05/04/2015 9.0* 1.4 -  6.5 K/uL Final  . Lymphocytes Relative 05/04/2015 53   Final  . Lymphs Abs 05/04/2015 12.6* 1.0 - 3.6 K/uL Final  . Monocytes Relative 05/04/2015 6   Final  . Monocytes Absolute 05/04/2015 1.3* 0.2 - 1.0 K/uL Final  . Eosinophils Relative 05/04/2015 2   Final  . Eosinophils Absolute 05/04/2015 0.6  0 - 0.7 K/uL Final  . Basophils Relative 05/04/2015 1   Final  . Basophils Absolute 05/04/2015 0.2* 0 - 0.1 K/uL Final  . Sed Rate 05/04/2015 4  0 - 20 mm/hr Final    Assessment:  IZAYIAH TIBBITTS is a 74 y.o. male with lymphocytosis since 04/11/2015.  WBC was 28,700 with 34% segs and 64% lymphocytes.  Hematocrit and platelet count were normal.  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  Symptomatically, he denies any B symptoms.  Exam reveals a 1.5-2 cm low left posterior cervical node and a palpable spleen tip.  Plan: 1. Labs today:  CBC with diff, ESR, flow cytometry. 2. Preliminary discussion with patient regarding likely diagnosis of CLL and indications for treatment. 3. RTC in 1 week for MD assessment and review of labs.   Lequita Asal, MD  05/04/2015

## 2015-05-09 LAB — COMP PANEL: LEUKEMIA/LYMPHOMA: Immunophenotypic Profile: 50

## 2015-05-11 ENCOUNTER — Inpatient Hospital Stay (HOSPITAL_BASED_OUTPATIENT_CLINIC_OR_DEPARTMENT_OTHER): Payer: Medicare Other | Admitting: Hematology and Oncology

## 2015-05-11 ENCOUNTER — Inpatient Hospital Stay: Payer: Medicare Other | Admitting: *Deleted

## 2015-05-11 VITALS — BP 141/76 | HR 72 | Temp 96.9°F | Resp 18 | Ht 67.5 in | Wt 220.0 lb

## 2015-05-11 DIAGNOSIS — D72829 Elevated white blood cell count, unspecified: Secondary | ICD-10-CM

## 2015-05-11 DIAGNOSIS — C9110 Chronic lymphocytic leukemia of B-cell type not having achieved remission: Secondary | ICD-10-CM

## 2015-05-11 DIAGNOSIS — K589 Irritable bowel syndrome without diarrhea: Secondary | ICD-10-CM

## 2015-05-11 DIAGNOSIS — R61 Generalized hyperhidrosis: Secondary | ICD-10-CM

## 2015-05-11 DIAGNOSIS — Z7982 Long term (current) use of aspirin: Secondary | ICD-10-CM | POA: Diagnosis not present

## 2015-05-11 DIAGNOSIS — Z79899 Other long term (current) drug therapy: Secondary | ICD-10-CM

## 2015-05-11 DIAGNOSIS — M129 Arthropathy, unspecified: Secondary | ICD-10-CM

## 2015-05-11 DIAGNOSIS — J069 Acute upper respiratory infection, unspecified: Secondary | ICD-10-CM

## 2015-05-11 DIAGNOSIS — N400 Enlarged prostate without lower urinary tract symptoms: Secondary | ICD-10-CM

## 2015-05-11 DIAGNOSIS — E785 Hyperlipidemia, unspecified: Secondary | ICD-10-CM

## 2015-05-11 DIAGNOSIS — E119 Type 2 diabetes mellitus without complications: Secondary | ICD-10-CM

## 2015-05-11 NOTE — Progress Notes (Signed)
Patient is here for follow-up of leukocytosis and lab results. He states that he has been battling a cold this week and has been taking OTC medications. Otherwise he has been doing well and offers no complaints.

## 2015-05-11 NOTE — Progress Notes (Signed)
[facility] day:  05/11/2015  Chief Complaint: Philip Robbins is a 74 y.o. male with leukocytosis who is seen for review of work-up and discussion regarding direction of therapy.  HPI: The patient was last seen in the medical oncology clinic on 05/04/2015.  At that time, he was seen in initial consultation regarding leukocytosis.  Differential was predominantly lymphocytes.  Exam revealed a palpable spleen tip and a small left posterior cervical node.  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  Labs were performed.  CBC revealed a hematocrit of 46.4, hemoglobin 15.3, platelets 273,000, white blood count 23,700 with an [place] 9000.  Absolute lymphocyte count was 12,600.  Flow cytometry confirmed CLL.  There was a population of monoclonal B-lymphocytes that were restricted to the dim expression of kappa light chain immunoglobulin.  The clonal B cells expressed CD5 and CD23.  Expression of CD20 was dim.  CD38 expression was in 55% of clonal B cells.  There were no increased blasts.  Symptomatically, he notes a cold.  He is taking "over the counter stuff".  He denies any fever.  Past Medical History  Diagnosis Date  . DM type 2 (diabetes mellitus, type 2) ([place])   . HLD (hyperlipidemia)   . Environmental allergies   . BPH (benign prostatic hypertrophy)   . Headache     sinus headaches  . Arthritis     knee and back    Past Surgical History  Procedure Laterality Date  . Cardiac catheterization    . Cardiac catheterization      no stents placed  . Knee arthroscopy Right 03/16/2015    Procedure: ARTHROSCOPY KNEE WITH PARTIAL MEDIAL MENISECTOMY, AND CHONDROPLASTY;  Surgeon: Leanor Kail, MD;  Location: [facility];  Service: Orthopedics;  Laterality: Right;  Diabetic - oral meds    Family History  Problem Relation Age of Onset  . Renal Disease Father     ESRD  . Diabetes Father   . Breast cancer Sister     Social  History:  reports that he has never smoked. He has never used smokeless tobacco. He reports that he does not drink alcohol or use illicit drugs.  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  Allergies: No Known Allergies  Current Medications: Current Outpatient Prescriptions  Medication Sig Dispense Refill  . acyclovir (ZOVIRAX) 200 MG capsule 400 mg once.    Marland Kitchen aspirin (ASPIRIN EC) 81 MG EC tablet Take 81 mg by mouth daily. Swallow whole.    Marland Kitchen azelastine (ASTELIN) 0.1 % nasal [place] [address] 1 Streed into both nostrils 2 (two) times daily. Use in each nostril as directed    . cetirizine (ZYRTEC) 10 MG tablet Take 10 mg by mouth daily.    . fluorometholone (FML) 0.1 % ophthalmic suspension Place 1 drop into both eyes 2 (two) times a week.    . fluticasone (FLONASE) 50 MCG/ACT nasal [place] [address] 2 sprays into both nostrils daily.    Marland Kitchen lisinopril (PRINIVIL,ZESTRIL) 10 MG tablet Take 10 mg by mouth daily.    . meloxicam (MOBIC) 7.5 MG tablet Take 7.5 mg by mouth daily.    . metFORMIN (GLUCOPHAGE) 500 MG tablet Take by mouth 2 (two) times daily with a meal.    . NORCO 5-325 MG per tablet Take 1-2 tablets by mouth every 6 (six) hours as needed for moderate pain. MAXIMUM TOTAL ACETAMINOPHEN DOSE IS 4000 MG PER DAY 25 tablet 0  .  pioglitazone (ACTOS) 45 MG tablet Take 45 mg by mouth daily.    . pravastatin (PRAVACHOL) 40 MG tablet Take 40 mg by mouth daily.    . Probiotic Product (PROBIOTIC ACIDOPHILUS) CAPS Take by mouth.    . sitaGLIPtin (JANUVIA) 50 MG tablet Take 50 mg by mouth daily.     No current facility-administered medications for this visit.    Review of Systems:  GENERAL:  Feels about the same.  No fevers, sweats or weight loss. PERFORMANCE STATUS (ECOG):  0 HEENT:  Dry eyes corrected with drops.  URI.  No visual changes, sore throat, mouth sores or tenderness. Lungs: No shortness of breath or cough.  No hemoptysis. Cardiac:  No chest pain, palpitations, orthopnea, or  PND. GI:  Irritable bowel.  Diarrhea, takes probiotics.  No nausea, vomiting, constipation, melena or hematochezia.  Colonoscopy 2-3 years ago.  Polyps removed.   GU:  No urgency, frequency, dysuria, or hematuria. Musculoskeletal:  No back pain.  No joint pain.  No muscle tenderness. Extremities:  No pain or swelling. Skin:  No rashes, ulcers or skin changes. Neuro:  No headache, numbness or weakness, balance or coordination issues. Endocrine:  Diabetes.  No thyroid issues, hot flashes or night sweats. Psych:  No mood changes, depression or anxiety. Pain:  No focal pain. Review of systems:  All other systems reviewed and found to be negative.  Physical Exam: Blood pressure 141/76, pulse 72, temperature 96.9 F (36.1 C), temperature source Tympanic, resp. rate 18, height 5' 7.5" (1.715 m), weight 220 lb 0.3 oz (99.8 kg). GENERAL:  Well developed, well nourished, sitting comfortably in the exam room in no acute distress. MENTAL STATUS:  Alert and oriented to person, place and time. HEAD:  Short gray hair.  Normocephalic, atraumatic, face symmetric, no Cushingoid features. EYES:  Blue eyes.  No conjunctivitis or scleral icterus. NEUROLOGICAL: Unremarkable. PSYCH:  Appropriate.  No visits with results within 3 Day(s) from this visit. Latest known visit with results is:  Appointment on 05/04/2015  Component Date Value Ref Range Status  . WBC 05/04/2015 23.7* 3.8 - 10.6 K/uL Final  . RBC 05/04/2015 5.14  4.40 - 5.90 MIL/uL Final  . Hemoglobin 05/04/2015 15.3  13.0 - 18.0 g/dL Final  . HCT 05/04/2015 46.4  40.0 - 52.0 % Final  . MCV 05/04/2015 90.1  80.0 - 100.0 fL Final  . MCH 05/04/2015 29.7  26.0 - 34.0 pg Final  . MCHC 05/04/2015 32.9  32.0 - 36.0 g/dL Final  . RDW 05/04/2015 14.6* 11.5 - 14.5 % Final  . Platelets 05/04/2015 273  150 - 440 K/uL Final  . Neutrophils Relative % 05/04/2015 38   Final  . Neutro Abs 05/04/2015 9.0* 1.4 - 6.5 K/uL Final  . Lymphocytes Relative 05/04/2015  53   Final  . Lymphs Abs 05/04/2015 12.6* 1.0 - 3.6 K/uL Final  . Monocytes Relative 05/04/2015 6   Final  . Monocytes Absolute 05/04/2015 1.3* 0.2 - 1.0 K/uL Final  . Eosinophils Relative 05/04/2015 2   Final  . Eosinophils Absolute 05/04/2015 0.6  0 - 0.7 K/uL Final  . Basophils Relative 05/04/2015 1   Final  . Basophils Absolute 05/04/2015 0.2* 0 - 0.1 K/uL Final  . Sed Rate 05/04/2015 4  0 - 20 mm/hr Final  . PATH INTERP XXX-IMP 05/04/2015 Comment   Final   Comment: (NOTE) Chronic lymphocytic leukemia, B-cell, CD38 positive (See Comment)   . ANNOTATION COMMENT IMP 05/04/2015 Comment   Corrected   Comment: (  NOTE) Expression of CD38 on >30% clonal B-cells is reported to be of an unfavorable prognostic factor in CLL. Clinical correlation and follow-up are recommended. FISH analysis of the peripheral blood for changes common in B-CLL may provide additional prognostic information and is suggested if clinically indicated.   Marland Kitchen CLINICAL INFO 05/04/2015 Comment   Corrected   Comment: (NOTE) Lymphocytosis Accompanying CBC dated 05-04-15 shows: WBC count 23.7, Neu 9.0, Lym 12.6, Mon 1.3, Bas 0.2   . Misc Source 05/04/2015 Comment   Final   Peripheral blood  . ASSESSMENT OF LEUKOCYTES 05/04/2015 Comment   Final   Comment: (NOTE) Immunophenotypic analysis demonstrates a population of monoclonal B-lymphocytes that are restricted to the dim expression of kappa light chain immunoglobulin. The clonal B cells account for 50% of leukocytes. The clonal B-cells show expression of CD5 and CD23. Expression of CD20 is dim, and expression of CD22 is also dim or virtually negative. The cells are positive for CD11c and negative for FMC-7. The phenotype is typical of chronic lymphocytic leukemia/small lymphocytic lymphoma (CLL/SLL). CD38 is expressed on 55% of the clonal B-cells. There is no loss of, or aberrant expression of, the pan T cell antigens to suggest a neoplastic T cell  process. An increased CD4/T helper to CD8/T suppressor cell ratio is detected. CD4:CD8 ratio 4.8 No circulating blasts are detected. There is no immunophenotypic evidence of abnormal myeloid maturation.   Marland Kitchen % Viable Cells 05/04/2015 Comment   Corrected   85%  . Immunophenotypic Profile 05/04/2015 50% of total cells (Phenotype below)   Corrected   Comment: Comment Abnormal cell population: present   . ANALYSIS AND GATING STRATEGY 05/04/2015 Comment   Final   8 color analysis with CD45/SSC  . IMMUNOPHENOTYPING STUDY 05/04/2015 Comment   Final   Comment: (NOTE) CD2       (-)            CD3       (-) CD4       (-)            CD5       (+) CD7       (-)            CD8       (-) CD10      (-)            CD11b     (-) CD11c     (+)            CD13      (-) CD14      (-)            CD15      (-) CD16      (-)            CD19      (+) CD20      (+) Dim        CD22      (+) Dim CD23      (+)            CD33      (-) CD34      (-)            CD38      (+) CD45      (+)            CD56      (-) CD57      (-)  CD103     (-) CD117     (-)            FMC-7     (-) HLA-DR    (+)            KAPPA     (+) Dim LAMBDA    (-)            CD64      (-)   . PATHOLOGIST NAME 05/04/2015 Comment   Final   Theda Sers, M.D.  . COMMENT: 05/04/2015 Comment   Corrected   Comment: (NOTE) Each antibody in this assay was utilized to assess for potential abnormalities of studied cell populations or to characterize identified abnormalities. This test was developed and its performance characteristics determined by [facility].  It has not been cleared or approved by the U.S. Food and Drug Administration. The FDA has determined that such clearance or approval is not necessary. This test is used for clinical purposes.  It should not be regarded as investigational or for research. Performed At: -[facility] [place], [place] [id number] Nechama Guard MD  AT:[id number] Performed At: [facility] [address] [place], [place] [id number] Nechama Guard MD GB:[id number]     Assessment:  Philip Robbins is a 74 y.o. male with stage 0 chronic lymphocytic leukemia.  He has had lymphocytosis since 04/11/2015.  He has isolated lymphocytosis.  WBC on 05/04/2015 revealed a white blood count 23,700 with an [place] 9,000.  Absolute lymphocyte count was 12,600.  Flow cytometry confirmed CLL.  There was a population of monoclonal B-lymphocytes that were restricted to the dim expression of kappa light chain immunoglobulin.  The clonal B cells expressed CD5 and CD23.  Expression of CD20 was dim.  CD38 expression was in 55% of clonal B cells.  There were no increased blasts.  Symptomatically, he denies any B symptoms.  He currently has a URI.  Exam reveals a 1.5-2 cm low left posterior cervical node and a palpable spleen tip.  Plan: 1.  Discuss labs and diagnosis of CLL.  Discuss staging (0- lymphocytosis, I- enlarged nodes, II- splenomegaly, III- hematocrit/hemoglobin < 33/11, IV- platelets < 100,000).  Discuss indications for treatment (B symptoms, bulky adenopathy, organ dysfunction, anemia, thrombocytopenia).  Discuss obtaining FISH studies.  Discuss plan for observation. 2.  Labs today:  CLL FISH studies. 3.  RTC in 3 months for MD assessment and labs (CBC with diff, BMP, uric acid).   Lequita Asal, MD  05/11/2015 , 11:51 AM

## 2015-05-18 LAB — FISH HES LEUKEMIA, 4Q12 REA

## 2015-06-19 ENCOUNTER — Encounter: Payer: Self-pay | Admitting: Hematology and Oncology

## 2015-08-10 ENCOUNTER — Inpatient Hospital Stay: Payer: Medicare Other | Attending: Internal Medicine

## 2015-08-10 ENCOUNTER — Inpatient Hospital Stay (HOSPITAL_BASED_OUTPATIENT_CLINIC_OR_DEPARTMENT_OTHER): Payer: Medicare Other | Admitting: Hematology and Oncology

## 2015-08-10 ENCOUNTER — Encounter: Payer: Self-pay | Admitting: Hematology and Oncology

## 2015-08-10 VITALS — BP 125/74 | HR 90 | Temp 97.4°F | Resp 18 | Ht 67.5 in | Wt 223.5 lb

## 2015-08-10 DIAGNOSIS — Z79899 Other long term (current) drug therapy: Secondary | ICD-10-CM | POA: Diagnosis not present

## 2015-08-10 DIAGNOSIS — Z808 Family history of malignant neoplasm of other organs or systems: Secondary | ICD-10-CM

## 2015-08-10 DIAGNOSIS — C9110 Chronic lymphocytic leukemia of B-cell type not having achieved remission: Secondary | ICD-10-CM | POA: Diagnosis present

## 2015-08-10 DIAGNOSIS — E119 Type 2 diabetes mellitus without complications: Secondary | ICD-10-CM | POA: Insufficient documentation

## 2015-08-10 DIAGNOSIS — Z7984 Long term (current) use of oral hypoglycemic drugs: Secondary | ICD-10-CM

## 2015-08-10 DIAGNOSIS — E785 Hyperlipidemia, unspecified: Secondary | ICD-10-CM | POA: Diagnosis not present

## 2015-08-10 DIAGNOSIS — D72820 Lymphocytosis (symptomatic): Secondary | ICD-10-CM

## 2015-08-10 DIAGNOSIS — N400 Enlarged prostate without lower urinary tract symptoms: Secondary | ICD-10-CM | POA: Diagnosis not present

## 2015-08-10 DIAGNOSIS — M129 Arthropathy, unspecified: Secondary | ICD-10-CM | POA: Insufficient documentation

## 2015-08-10 DIAGNOSIS — R51 Headache: Secondary | ICD-10-CM | POA: Insufficient documentation

## 2015-08-10 DIAGNOSIS — Z7982 Long term (current) use of aspirin: Secondary | ICD-10-CM | POA: Diagnosis not present

## 2015-08-10 LAB — CBC WITH DIFFERENTIAL/PLATELET
Basophils Absolute: 0.1 10*3/uL (ref 0–0.1)
Basophils Relative: 1 %
Eosinophils Absolute: 0.5 10*3/uL (ref 0–0.7)
Eosinophils Relative: 2 %
HCT: 44.5 % (ref 40.0–52.0)
Hemoglobin: 15 g/dL (ref 13.0–18.0)
Lymphocytes Relative: 56 %
Lymphs Abs: 12.1 10*3/uL — ABNORMAL HIGH (ref 1.0–3.6)
MCH: 30.3 pg (ref 26.0–34.0)
MCHC: 33.7 g/dL (ref 32.0–36.0)
MCV: 90 fL (ref 80.0–100.0)
Monocytes Absolute: 1.3 10*3/uL — ABNORMAL HIGH (ref 0.2–1.0)
Monocytes Relative: 6 %
Neutro Abs: 7.6 10*3/uL — ABNORMAL HIGH (ref 1.4–6.5)
Neutrophils Relative %: 35 %
Platelets: 299 10*3/uL (ref 150–440)
RBC: 4.94 MIL/uL (ref 4.40–5.90)
RDW: 15 % — ABNORMAL HIGH (ref 11.5–14.5)
WBC: 21.6 10*3/uL — ABNORMAL HIGH (ref 3.8–10.6)

## 2015-08-10 LAB — BASIC METABOLIC PANEL
Anion gap: 6 (ref 5–15)
BUN: 16 mg/dL (ref 6–20)
CO2: 23 mmol/L (ref 22–32)
Calcium: 9.6 mg/dL (ref 8.9–10.3)
Chloride: 101 mmol/L (ref 101–111)
Creatinine, Ser: 0.97 mg/dL (ref 0.61–1.24)
GFR calc Af Amer: >60 mL/min (ref >60)
GFR calc non Af Amer: >60 mL/min (ref >60)
Glucose, Bld: 108 mg/dL — ABNORMAL HIGH (ref 65–99)
Potassium: 4.8 mmol/L (ref 3.5–5.1)
Sodium: 130 mmol/L — ABNORMAL LOW (ref 135–145)

## 2015-08-10 LAB — URIC ACID: Uric Acid, Serum: 5.6 mg/dL (ref 4.4–7.6)

## 2015-08-10 NOTE — Progress Notes (Signed)
[facility] day:  08/10/2015   Chief Complaint: WARNIE BELAIR is a 75 y.o. male with stage 0 chronic lymphocytic leukemia who is seen for 3 month assessment.  HPI: The patient was last seen in the medical oncology clinic on 05/11/2015.  At that time, he was seen for review of initial work-up.  Flow cytometry had confirmed CLL.  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  Results for CCND1/IGH, ATM, 13q and TP53 were normal.   Symptomatically, he feels fine.  He denies any B symptoms.  He denies any adenopathy, bruising or bleeding.  Past Medical History  Diagnosis Date  . DM type 2 (diabetes mellitus, type 2) ([place])   . HLD (hyperlipidemia)   . Environmental allergies   . BPH (benign prostatic hypertrophy)   . Headache     sinus headaches  . Arthritis     knee and back    Past Surgical History  Procedure Laterality Date  . Cardiac catheterization    . Cardiac catheterization      no stents placed  . Knee arthroscopy Right 03/16/2015    Procedure: ARTHROSCOPY KNEE WITH PARTIAL MEDIAL MENISECTOMY, AND CHONDROPLASTY;  Surgeon: Leanor Kail, MD;  Location: [facility];  Service: Orthopedics;  Laterality: Right;  Diabetic - oral meds    Family History  Problem Relation Age of Onset  . Renal Disease Father     ESRD  . Diabetes Father   . Breast cancer Sister     Social History:  reports that he has never smoked. He has never used smokeless tobacco. He reports that he does not drink alcohol or use illicit drugs.  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  Allergies: No Known Allergies  Current Medications: Current Outpatient Prescriptions  Medication Sig Dispense Refill  . acyclovir (ZOVIRAX) 200 MG capsule 400 mg once.    Marland Kitchen aspirin (ASPIRIN EC) 81 MG EC tablet Take 81 mg by mouth daily. Swallow whole.    Marland Kitchen azelastine (ASTELIN) 0.1 % nasal [place] Place 1 Gott into both nostrils 2 (two) times daily.  Use in each nostril as directed    . cetirizine (ZYRTEC) 10 MG tablet Take 10 mg by mouth daily.    . fluorometholone (FML) 0.1 % ophthalmic suspension Place 1 drop into both eyes 2 (two) times a week.    . fluticasone (FLONASE) 50 MCG/ACT nasal [place] Place 2 sprays into both nostrils daily.    Marland Kitchen lisinopril (PRINIVIL,ZESTRIL) 10 MG tablet Take 10 mg by mouth daily.    . meloxicam (MOBIC) 7.5 MG tablet Take 7.5 mg by mouth daily.    . metFORMIN (GLUCOPHAGE) 500 MG tablet Take by mouth 2 (two) times daily with a meal.    . NORCO 5-325 MG per tablet Take 1-2 tablets by mouth every 6 (six) hours as needed for moderate pain. MAXIMUM TOTAL ACETAMINOPHEN DOSE IS 4000 MG PER DAY 25 tablet 0  . pioglitazone (ACTOS) 45 MG tablet Take 45 mg by mouth daily.    . pravastatin (PRAVACHOL) 40 MG tablet Take 40 mg by mouth daily.    . Probiotic Product (PROBIOTIC ACIDOPHILUS) CAPS Take by mouth.    . sitaGLIPtin (JANUVIA) 50 MG tablet Take 50 mg by mouth daily.     No current facility-administered medications for this visit.    Review of Systems:  GENERAL:  Feels about the same.  No fevers, sweats or weight loss.  PERFORMANCE STATUS (ECOG):  0 HEENT:  Dry eyes corrected with drops. No visual changes, sore throat, mouth sores or tenderness. Lungs: No shortness of breath or cough.  No hemoptysis. Cardiac:  No chest pain, palpitations, orthopnea, or PND. GI:  Irritable bowel.  Diarrhea, takes probiotics.  No nausea, vomiting, constipation, melena or hematochezia.  Colonoscopy 2-3 years ago.  Polyps removed.   GU:  No urgency, frequency, dysuria, or hematuria. Musculoskeletal:  No back pain.  No joint pain.  No muscle tenderness. Extremities:  No pain or swelling. Skin:  No rashes, ulcers or skin changes. Neuro:  No headache, numbness or weakness, balance or coordination issues. Endocrine:  Diabetes.  No thyroid issues, hot flashes or night sweats. Psych:  No mood changes, depression or anxiety. Pain:  No  focal pain. Review of systems:  All other systems reviewed and found to be negative.  Physical Exam: Blood pressure 125/74, pulse 90, temperature 97.4 F (36.3 C), temperature source Tympanic, resp. rate 18, height 5' 7.5" (1.715 m), weight 223 lb 8.7 oz (101.4 kg). GENERAL:  Well developed, well nourished, sitting comfortably in the exam room in no acute distress. MENTAL STATUS:  Alert and oriented to person, place and time. HEAD:  Short gray hair.  Normocephalic, atraumatic, face symmetric, no Cushingoid features. EYES:  Glasses.  Blue eyes.  Pupils equal round and reactive to light and accomodation.  No conjunctivitis or scleral icterus. ENT:  Oropharynx clear without lesion.  Tongue normal. Mucous membranes moist.  RESPIRATORY:  Clear to auscultation without rales, wheezes or rhonchi. CARDIOVASCULAR:  Regular rate and rhythm without murmur, rub or gallop. ABDOMEN:  Soft, non-tender, with active bowel sounds, and no hepatomegaly.  Spleen tip barely palpable.  No masses. SKIN:  No rashes, ulcers or lesions. EXTREMITIES: No edema, no skin discoloration or tenderness.  No palpable cords. LYMPH NODES: No palpable cervical, supraclavicular, axillary or inguinal adenopathy  NEUROLOGICAL: Unremarkable. PSYCH:  Appropriate.   Appointment on 08/10/2015  Component Date Value Ref Range Status  . WBC 08/10/2015 21.6* 3.8 - 10.6 K/uL Final  . RBC 08/10/2015 4.94  4.40 - 5.90 MIL/uL Final  . Hemoglobin 08/10/2015 15.0  13.0 - 18.0 g/dL Final  . HCT 08/10/2015 44.5  40.0 - 52.0 % Final  . MCV 08/10/2015 90.0  80.0 - 100.0 fL Final  . MCH 08/10/2015 30.3  26.0 - 34.0 pg Final  . MCHC 08/10/2015 33.7  32.0 - 36.0 g/dL Final  . RDW 08/10/2015 15.0* 11.5 - 14.5 % Final  . Platelets 08/10/2015 299  150 - 440 K/uL Final  . Neutrophils Relative % 08/10/2015 35   Final  . Neutro Abs 08/10/2015 7.6* 1.4 - 6.5 K/uL Final  . Lymphocytes Relative 08/10/2015 56   Final  . Lymphs Abs 08/10/2015 12.1* 1.0 -  3.6 K/uL Final  . Monocytes Relative 08/10/2015 6   Final  . Monocytes Absolute 08/10/2015 1.3* 0.2 - 1.0 K/uL Final  . Eosinophils Relative 08/10/2015 2   Final  . Eosinophils Absolute 08/10/2015 0.5  0 - 0.7 K/uL Final  . Basophils Relative 08/10/2015 1   Final  . Basophils Absolute 08/10/2015 0.1  0 - 0.1 K/uL Final  . Sodium 08/10/2015 130* 135 - 145 mmol/L Final  . Potassium 08/10/2015 4.8  3.5 - 5.1 mmol/L Final  . Chloride 08/10/2015 101  101 - 111 mmol/L Final  . CO2 08/10/2015 23  22 - 32 mmol/L Final  . Glucose, Bld 08/10/2015 108* 65 - 99 mg/dL Final  .  BUN 08/10/2015 16  6 - 20 mg/dL Final  . Creatinine, Ser 08/10/2015 0.97  0.61 - 1.24 mg/dL Final  . Calcium 08/10/2015 9.6  8.9 - 10.3 mg/dL Final  . GFR calc non Af Amer 08/10/2015 >60  >60 mL/min Final  . GFR calc Af Amer 08/10/2015 >60  >60 mL/min Final   Comment: (NOTE) The eGFR has been calculated using the CKD EPI equation. This calculation has not been validated in all clinical situations. eGFR's persistently <60 mL/min signify possible Chronic Kidney Disease.   . Anion gap 08/10/2015 6  5 - 15 Final    Assessment:  KIEV LABROSSE is a 75 y.o. male with stage 0 chronic lymphocytic leukemia.  He has had lymphocytosis since 04/11/2015.  He has isolated lymphocytosis.  WBC on 05/04/2015 revealed a white blood count 23,700 with an [place] 9,000.  Absolute lymphocyte count was 12,600.  Flow cytometry confirmed CLL.  There was a population of monoclonal B-lymphocytes that were restricted to the dim expression of kappa light chain immunoglobulin.  The clonal B cells expressed CD5 and CD23.  Expression of CD20 was dim.  CD38 expression was in 55% of clonal B cells.  There were no increased blasts.  FISH studies revealed trisomy 12.  Symptomatically, he denies any B symptoms.  Exam reveals a barely palpable spleen tip.  Plan: 1.  Labs today:  CBC with diff, BMP, uric acid. 2.  Discuss plans for ongoing observation.  Review  indications for treatment (B symptoms, bulky adenopathy, organ dysfunction, anemia, thrombocytopenia). Review FISH studies from last visit.  3.  Discuss plans for follow-up every 6 months after next visit if symptoms, exam, and labs are stable. 4.  RTC in 4 months for MD assessment and labs (CBC with diff, BMP, uric acid).   Lequita Asal, MD  08/10/2015 , 12:21 PM

## 2015-08-11 ENCOUNTER — Encounter: Payer: Self-pay | Admitting: Hematology and Oncology

## 2015-12-07 ENCOUNTER — Ambulatory Visit: Payer: Medicare Other | Admitting: Hematology and Oncology

## 2015-12-07 ENCOUNTER — Other Ambulatory Visit: Payer: Medicare Other

## 2015-12-24 ENCOUNTER — Inpatient Hospital Stay: Payer: Medicare Other | Attending: Hematology and Oncology

## 2015-12-24 ENCOUNTER — Inpatient Hospital Stay (HOSPITAL_BASED_OUTPATIENT_CLINIC_OR_DEPARTMENT_OTHER): Payer: Medicare Other | Admitting: Hematology and Oncology

## 2015-12-24 VITALS — BP 119/63 | HR 80 | Temp 96.0°F | Resp 17 | Ht 67.5 in | Wt 226.7 lb

## 2015-12-24 DIAGNOSIS — C9110 Chronic lymphocytic leukemia of B-cell type not having achieved remission: Secondary | ICD-10-CM

## 2015-12-24 DIAGNOSIS — Q928 Other specified trisomies and partial trisomies of autosomes: Secondary | ICD-10-CM | POA: Diagnosis not present

## 2015-12-24 DIAGNOSIS — Z7982 Long term (current) use of aspirin: Secondary | ICD-10-CM

## 2015-12-24 DIAGNOSIS — M129 Arthropathy, unspecified: Secondary | ICD-10-CM | POA: Insufficient documentation

## 2015-12-24 DIAGNOSIS — E119 Type 2 diabetes mellitus without complications: Secondary | ICD-10-CM

## 2015-12-24 DIAGNOSIS — N400 Enlarged prostate without lower urinary tract symptoms: Secondary | ICD-10-CM

## 2015-12-24 DIAGNOSIS — D72820 Lymphocytosis (symptomatic): Secondary | ICD-10-CM

## 2015-12-24 DIAGNOSIS — E785 Hyperlipidemia, unspecified: Secondary | ICD-10-CM | POA: Diagnosis not present

## 2015-12-24 DIAGNOSIS — Z7984 Long term (current) use of oral hypoglycemic drugs: Secondary | ICD-10-CM | POA: Diagnosis not present

## 2015-12-24 DIAGNOSIS — R51 Headache: Secondary | ICD-10-CM | POA: Diagnosis not present

## 2015-12-24 LAB — CBC WITH DIFFERENTIAL/PLATELET
Basophils Absolute: 0.2 10*3/uL — ABNORMAL HIGH (ref 0–0.1)
Basophils Relative: 1 %
Eosinophils Absolute: 0.4 10*3/uL (ref 0–0.7)
Eosinophils Relative: 2 %
HCT: 43.3 % (ref 40.0–52.0)
Hemoglobin: 14.6 g/dL (ref 13.0–18.0)
Lymphocytes Relative: 58 %
Lymphs Abs: 12.2 10*3/uL — ABNORMAL HIGH (ref 1.0–3.6)
MCH: 30.2 pg (ref 26.0–34.0)
MCHC: 33.7 g/dL (ref 32.0–36.0)
MCV: 89.7 fL (ref 80.0–100.0)
Monocytes Absolute: 1.3 10*3/uL — ABNORMAL HIGH (ref 0.2–1.0)
Monocytes Relative: 6 %
Neutro Abs: 7 10*3/uL — ABNORMAL HIGH (ref 1.4–6.5)
Neutrophils Relative %: 33 %
Platelets: 267 10*3/uL (ref 150–440)
RBC: 4.83 MIL/uL (ref 4.40–5.90)
RDW: 14.8 % — ABNORMAL HIGH (ref 11.5–14.5)
WBC: 21.2 10*3/uL — ABNORMAL HIGH (ref 3.8–10.6)

## 2015-12-24 LAB — BASIC METABOLIC PANEL
Anion gap: 7 (ref 5–15)
BUN: 16 mg/dL (ref 6–20)
CO2: 23 mmol/L (ref 22–32)
Calcium: 9.6 mg/dL (ref 8.9–10.3)
Chloride: 104 mmol/L (ref 101–111)
Creatinine, Ser: 1.03 mg/dL (ref 0.61–1.24)
GFR calc Af Amer: >60 mL/min (ref >60)
GFR calc non Af Amer: >60 mL/min (ref >60)
Glucose, Bld: 213 mg/dL — ABNORMAL HIGH (ref 65–99)
Potassium: 4.7 mmol/L (ref 3.5–5.1)
Sodium: 134 mmol/L — ABNORMAL LOW (ref 135–145)

## 2015-12-24 LAB — URIC ACID: Uric Acid, Serum: 6.7 mg/dL (ref 4.4–7.6)

## 2015-12-24 NOTE — Progress Notes (Signed)
[facility] day:  12/24/2015   Chief Complaint: Philip Robbins is a 75 y.o. male with stage 0 chronic lymphocytic leukemia who is seen for 3 month assessment.  HPI: The patient was last seen in the medical oncology clinic on 08/10/2015.  At that time, he felt fine.  He denied any B symptoms.  He denied any adenopathy, bruising or bleeding.  CBC revealed a hematocrit of 44.5, hemoglobin 15.0, platelets 299,000, white count 21,600 with an [place] of 7600.  BMP revealed a sodium of 130.  Uric acid was 5.6.  During the interim, he has done well.  He voices no concerns. He denies any B symptoms.   Past Medical History  Diagnosis Date  . DM type 2 (diabetes mellitus, type 2) ([place])   . HLD (hyperlipidemia)   . Environmental allergies   . BPH (benign prostatic hypertrophy)   . Headache     sinus headaches  . Arthritis     knee and back    Past Surgical History  Procedure Laterality Date  . Cardiac catheterization    . Cardiac catheterization      no stents placed  . Knee arthroscopy Right 03/16/2015    Procedure: ARTHROSCOPY KNEE WITH PARTIAL MEDIAL MENISECTOMY, AND CHONDROPLASTY;  Surgeon: Leanor Kail, MD;  Location: [facility];  Service: Orthopedics;  Laterality: Right;  Diabetic - oral meds    Family History  Problem Relation Age of Onset  . Renal Disease Father     ESRD  . Diabetes Father   . Breast cancer Sister     Social History:  reports that he has never smoked. He has never used smokeless tobacco. He reports that he does not drink alcohol or use illicit drugs.  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  Allergies: No Known Allergies  Current Medications: Current Outpatient Prescriptions  Medication Sig Dispense Refill  . acyclovir (ZOVIRAX) 200 MG capsule 400 mg once.    Marland Kitchen aspirin (ASPIRIN EC) 81 MG EC tablet Take 81 mg by mouth daily. Swallow whole.    Marland Kitchen azelastine (ASTELIN) 0.1 % nasal [place] [address]  1 [place] into both nostrils 2 (two) times daily. Use in each nostril as directed    . cetirizine (ZYRTEC) 10 MG tablet Take 10 mg by mouth daily.    . fluticasone (FLONASE) 50 MCG/ACT nasal [place] [address] 2 sprays into both nostrils daily.    Marland Kitchen lisinopril (PRINIVIL,ZESTRIL) 10 MG tablet Take 10 mg by mouth daily.    . metFORMIN (GLUCOPHAGE) 500 MG tablet Take by mouth 2 (two) times daily with a meal.    . pioglitazone (ACTOS) 45 MG tablet Take 45 mg by mouth daily.    . pravastatin (PRAVACHOL) 40 MG tablet Take 40 mg by mouth daily.    . Probiotic Product (PROBIOTIC ACIDOPHILUS) CAPS Take by mouth.    . sitaGLIPtin (JANUVIA) 50 MG tablet Take 50 mg by mouth daily.     No current facility-administered medications for this visit.    Review of Systems:  GENERAL:  Feels good.  No fevers or sweats.  Weight up 3 pounds. PERFORMANCE STATUS (ECOG):  0 HEENT:  Dry eyes corrected with drops. No visual changes, sore throat, mouth sores or tenderness. Lungs: No shortness of breath or cough.  No hemoptysis. Cardiac:  No chest pain, palpitations, orthopnea, or PND. GI:  Irritable bowel.  Diarrhea, on probiotics.  No nausea, vomiting, constipation, melena or  hematochezia.  Colonoscopy 2-3 years ago.   GU:  No urgency, frequency, dysuria, or hematuria. Musculoskeletal:  No back pain.  No joint pain.  No muscle tenderness. Extremities:  No pain or swelling. Skin:  No rashes, ulcers or skin changes. Neuro:  No headache, numbness or weakness, balance or coordination issues. Endocrine:  Diabetes.  No thyroid issues, hot flashes or night sweats. Psych:  No mood changes, depression or anxiety. Pain:  No focal pain. Review of systems:  All other systems reviewed and found to be negative.  Physical Exam: Blood pressure 119/63, pulse 80, temperature 96 F (35.6 C), temperature source Tympanic, resp. rate 17, height 5' 7.5" (1.715 m), weight 226 lb 11.9 oz (102.85 kg). GENERAL:  Well developed, well nourished,  gentleman sitting comfortably in the exam room in no acute distress. MENTAL STATUS:  Alert and oriented to person, place and time. HEAD:  Short gray hair.  Normocephalic, atraumatic, face symmetric, no Cushingoid features. EYES:  Glasses.  Blue eyes.  Pupils equal round and reactive to light and accomodation.  No conjunctivitis or scleral icterus. ENT:  Oropharynx clear without lesion.  Tongue normal. Mucous membranes moist.  RESPIRATORY:  Clear to auscultation without rales, wheezes or rhonchi. CARDIOVASCULAR:  Regular rate and rhythm without murmur, rub or gallop. ABDOMEN:  Soft, non-tender, with active bowel sounds, and no hepatomegaly.  Spleen tip barely palpable (stable).  No masses. SKIN:  No rashes, ulcers or lesions. EXTREMITIES: No edema, no skin discoloration or tenderness.  No palpable cords. LYMPH NODES: No palpable cervical, supraclavicular, axillary or inguinal adenopathy  NEUROLOGICAL: Unremarkable. PSYCH:  Appropriate.   Appointment on 12/24/2015  Component Date Value Ref Range Status  . WBC 12/24/2015 21.2* 3.8 - 10.6 K/uL Final  . RBC 12/24/2015 4.83  4.40 - 5.90 MIL/uL Final  . Hemoglobin 12/24/2015 14.6  13.0 - 18.0 g/dL Final  . HCT 12/24/2015 43.3  40.0 - 52.0 % Final  . MCV 12/24/2015 89.7  80.0 - 100.0 fL Final  . MCH 12/24/2015 30.2  26.0 - 34.0 pg Final  . MCHC 12/24/2015 33.7  32.0 - 36.0 g/dL Final  . RDW 12/24/2015 14.8* 11.5 - 14.5 % Final  . Platelets 12/24/2015 267  150 - 440 K/uL Final  . Neutrophils Relative % 12/24/2015 33   Final  . Neutro Abs 12/24/2015 7.0* 1.4 - 6.5 K/uL Final  . Lymphocytes Relative 12/24/2015 58   Final  . Lymphs Abs 12/24/2015 12.2* 1.0 - 3.6 K/uL Final  . Monocytes Relative 12/24/2015 6   Final  . Monocytes Absolute 12/24/2015 1.3* 0.2 - 1.0 K/uL Final  . Eosinophils Relative 12/24/2015 2   Final  . Eosinophils Absolute 12/24/2015 0.4  0 - 0.7 K/uL Final  . Basophils Relative 12/24/2015 1   Final  . Basophils Absolute  12/24/2015 0.2* 0 - 0.1 K/uL Final  . Sodium 12/24/2015 134* 135 - 145 mmol/L Final  . Potassium 12/24/2015 4.7  3.5 - 5.1 mmol/L Final  . Chloride 12/24/2015 104  101 - 111 mmol/L Final  . CO2 12/24/2015 23  22 - 32 mmol/L Final  . Glucose, Bld 12/24/2015 213* 65 - 99 mg/dL Final  . BUN 12/24/2015 16  6 - 20 mg/dL Final  . Creatinine, Ser 12/24/2015 1.03  0.61 - 1.24 mg/dL Final  . Calcium 12/24/2015 9.6  8.9 - 10.3 mg/dL Final  . GFR calc non Af Amer 12/24/2015 >60  >60 mL/min Final  . GFR calc Af Amer 12/24/2015 >60  >60 mL/min  Final   Comment: (NOTE) The eGFR has been calculated using the CKD EPI equation. This calculation has not been validated in all clinical situations. eGFR's persistently <60 mL/min signify possible Chronic Kidney Disease.   . Anion gap 12/24/2015 7  5 - 15 Final  . Uric Acid, Serum 12/24/2015 6.7  4.4 - 7.6 mg/dL Final    Assessment:  Philip Robbins is a 75 y.o. male with stage 0 chronic lymphocytic leukemia.  He has had lymphocytosis since 04/11/2015.  WBC has ranged between 21,000 - 23,000.  Flow cytometry confirmed CLL.  There was a population of monoclonal B-lymphocytes that were restricted to the dim expression of kappa light chain immunoglobulin.  The clonal B cells expressed CD5 and CD23.  Expression of CD20 was dim.  CD38 expression was in 55% of clonal B cells.  There were no increased blasts.  FISH studies revealed trisomy 12.  Symptomatically, he denies any B symptoms.  Exam is stable.  Plan: 1.  Labs today:  CBC with diff, BMP, uric acid. 2.  RTC in 6 months for MD assessment and labs (CBC with diff, BMP, uric acid).   Lequita Asal, MD  12/24/2015 , 12:17 PM

## 2015-12-24 NOTE — Progress Notes (Signed)
No changes since his last visit he is aware of other than weight gain

## 2015-12-27 ENCOUNTER — Encounter: Payer: Self-pay | Admitting: Hematology and Oncology

## 2016-01-17 DIAGNOSIS — E785 Hyperlipidemia, unspecified: Secondary | ICD-10-CM | POA: Insufficient documentation

## 2016-06-21 NOTE — Progress Notes (Signed)
[facility] day:  06/23/16   Chief Complaint: Philip Robbins is a 75 y.o. male with stage 0 chronic lymphocytic leukemia who is seen for 6 month assessment.  HPI: The patient was last seen in the medical oncology clinic on 12/24/2015.  At that time, he was doing well.  He denied any B symptoms.  Exam was unremakable.  CBC revealed a hematocrit of 43.3, hemoglobin 14.6, platelets 267,000, white count 21,200 with an ANC of 7000.  BMP revealed a sodium of 134.  Uric acid was 6.7.  During the interim, he has felt great.  Energy level is fair.  He has issues with irritable bowel.  He denies any fever or infections.  He denies any bruising or bleeding. He denies any adenopathy.   Past Medical History:  Diagnosis Date  . Arthritis    knee and back  . BPH (benign prostatic hypertrophy)   . DM type 2 (diabetes mellitus, type 2) ([place])   . Environmental allergies   . Headache    sinus headaches  . HLD (hyperlipidemia)     Past Surgical History:  Procedure Laterality Date  . CARDIAC CATHETERIZATION    . CARDIAC CATHETERIZATION     no stents placed  . KNEE ARTHROSCOPY Right 03/16/2015   Procedure: ARTHROSCOPY KNEE WITH PARTIAL MEDIAL MENISECTOMY, AND CHONDROPLASTY;  Surgeon: Leanor Kail, MD;  Location: [facility];  Service: Orthopedics;  Laterality: Right;  Diabetic - oral meds    Family History  Problem Relation Age of Onset  . Renal Disease Father     ESRD  . Diabetes Father   . Breast cancer Sister     Social History:  reports that he has never smoked. He has never used smokeless tobacco. He reports that he does not drink alcohol or use drugs.  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  Allergies: No Known Allergies  Current Medications: Current Outpatient Prescriptions  Medication Sig Dispense Refill  . acyclovir (ZOVIRAX) 200 MG capsule 400 mg once.    Marland Kitchen aspirin (ASPIRIN EC) 81 MG EC tablet Take 81 mg by  mouth daily. Swallow whole.    . cetirizine (ZYRTEC) 10 MG tablet Take 10 mg by mouth daily.    . fluticasone (FLONASE) 50 MCG/ACT nasal [place] [address] 2 sprays into both nostrils daily.    Marland Kitchen lisinopril (PRINIVIL,ZESTRIL) 10 MG tablet Take 10 mg by mouth daily.    . metFORMIN (GLUCOPHAGE) 500 MG tablet Take by mouth 2 (two) times daily with a meal.    . pioglitazone (ACTOS) 45 MG tablet Take 45 mg by mouth daily.    . pravastatin (PRAVACHOL) 40 MG tablet Take 40 mg by mouth daily.    . Probiotic Product (PROBIOTIC ACIDOPHILUS) CAPS Take by mouth.    . sitaGLIPtin (JANUVIA) 50 MG tablet Take 50 mg by mouth daily.    Marland Kitchen azelastine (ASTELIN) 0.1 % nasal [place] 1 [place] into both nostrils 2 (two) times daily. Use in each nostril as directed     No current facility-administered medications for this visit.     Review of Systems:  GENERAL:  Feels great.  No fevers or sweats.  Weight down 2 pounds. PERFORMANCE STATUS (ECOG):  0 HEENT:  Dry eyes corrected with drops. No visual changes, sore throat, mouth sores or tenderness. Lungs: No shortness of breath or cough.  No hemoptysis. Cardiac:  No chest pain, palpitations, orthopnea, or PND. GI:  Irritable bowel.  Diarrhea, on probiotics.  No nausea, vomiting, constipation, melena or hematochezia.  Colonoscopy 2-3 years ago.   GU:  No urgency, frequency, dysuria, or hematuria. Musculoskeletal:  No back pain.  No joint pain.  No muscle tenderness. Extremities:  No pain or swelling. Skin:  No rashes, ulcers or skin changes. Neuro:  No headache, numbness or weakness, balance or coordination issues. Endocrine:  Diabetes.  No thyroid issues, hot flashes or night sweats. Psych:  No mood changes, depression or anxiety. Pain:  No focal pain. Review of systems:  All other systems reviewed and found to be negative.  Physical Exam: Blood pressure 135/76, pulse 83, temperature 97.4 F (36.3 C), temperature source Tympanic, resp. rate 18, weight 224 lb 13.9 oz  (102 kg). GENERAL:  Well developed, well nourished, gentleman sitting comfortably in the exam room in no acute distress. MENTAL STATUS:  Alert and oriented to person, place and time. HEAD:  Short gray hair.  Normocephalic, atraumatic, face symmetric, no Cushingoid features. EYES:  Glasses.  Blue eyes.  Pupils equal round and reactive to light and accomodation.  No conjunctivitis or scleral icterus. ENT:  Oropharynx clear without lesion.  Tongue normal. Mucous membranes moist.  RESPIRATORY:  Clear to auscultation without rales, wheezes or rhonchi. CARDIOVASCULAR:  Regular rate and rhythm without murmur, rub or gallop. ABDOMEN:  Soft, non-tender, with active bowel sounds, and no hepatomegaly.  Spleen tip barely palpable (stable).  No masses. SKIN:  No rashes, ulcers or lesions. EXTREMITIES: No edema, no skin discoloration or tenderness.  No palpable cords. LYMPH NODES: No palpable cervical, supraclavicular, axillary or inguinal adenopathy  NEUROLOGICAL: Unremarkable. PSYCH:  Appropriate.    Orders Only on 06/23/2016  Component Date Value Ref Range Status  . WBC 06/23/2016 23.3* 3.8 - 10.6 K/uL Final  . RBC 06/23/2016 4.82  4.40 - 5.90 MIL/uL Final  . Hemoglobin 06/23/2016 14.3  13.0 - 18.0 g/dL Final  . HCT 06/23/2016 43.2  40.0 - 52.0 % Final  . MCV 06/23/2016 89.6  80.0 - 100.0 fL Final  . MCH 06/23/2016 29.7  26.0 - 34.0 pg Final  . MCHC 06/23/2016 33.2  32.0 - 36.0 g/dL Final  . RDW 06/23/2016 14.5  11.5 - 14.5 % Final  . Platelets 06/23/2016 287  150 - 440 K/uL Final  . Neutrophils Relative % 06/23/2016 36  % Final  . Neutro Abs 06/23/2016 8.4* 1.4 - 6.5 K/uL Final  . Lymphocytes Relative 06/23/2016 55  % Final  . Lymphs Abs 06/23/2016 12.9* 1.0 - 3.6 K/uL Final  . Monocytes Relative 06/23/2016 5  % Final  . Monocytes Absolute 06/23/2016 1.2* 0.2 - 1.0 K/uL Final  . Eosinophils Relative 06/23/2016 3  % Final  . Eosinophils Absolute 06/23/2016 0.6  0 - 0.7 K/uL Final  . Basophils  Relative 06/23/2016 1  % Final  . Basophils Absolute 06/23/2016 0.2* 0 - 0.1 K/uL Final  . Sodium 06/23/2016 133* 135 - 145 mmol/L Final  . Potassium 06/23/2016 4.9  3.5 - 5.1 mmol/L Final  . Chloride 06/23/2016 102  101 - 111 mmol/L Final  . CO2 06/23/2016 23  22 - 32 mmol/L Final  . Glucose, Bld 06/23/2016 146* 65 - 99 mg/dL Final  . BUN 06/23/2016 10  6 - 20 mg/dL Final  . Creatinine, Ser 06/23/2016 0.87  0.61 - 1.24 mg/dL Final  . Calcium 06/23/2016 9.1  8.9 - 10.3 mg/dL Final  . GFR calc non Af Amer 06/23/2016 >60  >60 mL/min Final  .  GFR calc Af Amer 06/23/2016 >60  >60 mL/min Final   Comment: (NOTE) The eGFR has been calculated using the CKD EPI equation. This calculation has not been validated in all clinical situations. eGFR's persistently <60 mL/min signify possible Chronic Kidney Disease.   . Anion gap 06/23/2016 8  5 - 15 Final    Assessment:  Philip Robbins is a 75 y.o. male with stage 0 chronic lymphocytic leukemia.  He has had lymphocytosis since 04/11/2015.  WBC has ranged between 21,000 - 23,000.  Flow cytometry on 05/04/2015 confirmed CLL.  There was a population of monoclonal B-lymphocytes that were restricted to the dim expression of kappa light chain immunoglobulin.  The clonal B cells expressed CD5 and CD23.  Expression of CD20 was dim.  CD38 expression was in 55% of clonal B cells.  There were no increased blasts.  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  Symptomatically, he denies any B symptoms.  Exam reveals no adenopathy.  Spleen tip is barely palpable.  Plan: 1.  Labs today:  CBC with diff, BMP, uric acid. 2.  RTC in 6 months for MD assessment and labs (CBC with diff, BMP, uric acid).   Lequita Asal, MD  06/23/2016 , 10:34 AM

## 2016-06-23 ENCOUNTER — Inpatient Hospital Stay (HOSPITAL_BASED_OUTPATIENT_CLINIC_OR_DEPARTMENT_OTHER): Payer: Medicare Other | Admitting: Hematology and Oncology

## 2016-06-23 ENCOUNTER — Inpatient Hospital Stay: Payer: Medicare Other | Attending: Hematology and Oncology

## 2016-06-23 ENCOUNTER — Other Ambulatory Visit: Payer: Self-pay

## 2016-06-23 ENCOUNTER — Encounter: Payer: Self-pay | Admitting: Hematology and Oncology

## 2016-06-23 VITALS — BP 135/76 | HR 83 | Temp 97.4°F | Resp 18 | Wt 224.9 lb

## 2016-06-23 DIAGNOSIS — Z7984 Long term (current) use of oral hypoglycemic drugs: Secondary | ICD-10-CM | POA: Diagnosis not present

## 2016-06-23 DIAGNOSIS — Z79899 Other long term (current) drug therapy: Secondary | ICD-10-CM | POA: Diagnosis not present

## 2016-06-23 DIAGNOSIS — E785 Hyperlipidemia, unspecified: Secondary | ICD-10-CM | POA: Diagnosis not present

## 2016-06-23 DIAGNOSIS — Z803 Family history of malignant neoplasm of breast: Secondary | ICD-10-CM | POA: Diagnosis not present

## 2016-06-23 DIAGNOSIS — N400 Enlarged prostate without lower urinary tract symptoms: Secondary | ICD-10-CM | POA: Insufficient documentation

## 2016-06-23 DIAGNOSIS — D72820 Lymphocytosis (symptomatic): Secondary | ICD-10-CM

## 2016-06-23 DIAGNOSIS — Z7982 Long term (current) use of aspirin: Secondary | ICD-10-CM

## 2016-06-23 DIAGNOSIS — C9110 Chronic lymphocytic leukemia of B-cell type not having achieved remission: Secondary | ICD-10-CM | POA: Diagnosis not present

## 2016-06-23 DIAGNOSIS — E119 Type 2 diabetes mellitus without complications: Secondary | ICD-10-CM

## 2016-06-23 DIAGNOSIS — K589 Irritable bowel syndrome without diarrhea: Secondary | ICD-10-CM | POA: Diagnosis not present

## 2016-06-23 DIAGNOSIS — M129 Arthropathy, unspecified: Secondary | ICD-10-CM | POA: Diagnosis not present

## 2016-06-23 LAB — CBC WITH DIFFERENTIAL/PLATELET
Basophils Absolute: 0.2 10*3/uL — ABNORMAL HIGH (ref 0–0.1)
Basophils Relative: 1 %
Eosinophils Absolute: 0.6 10*3/uL (ref 0–0.7)
Eosinophils Relative: 3 %
HCT: 43.2 % (ref 40.0–52.0)
Hemoglobin: 14.3 g/dL (ref 13.0–18.0)
Lymphocytes Relative: 55 %
Lymphs Abs: 12.9 10*3/uL — ABNORMAL HIGH (ref 1.0–3.6)
MCH: 29.7 pg (ref 26.0–34.0)
MCHC: 33.2 g/dL (ref 32.0–36.0)
MCV: 89.6 fL (ref 80.0–100.0)
Monocytes Absolute: 1.2 10*3/uL — ABNORMAL HIGH (ref 0.2–1.0)
Monocytes Relative: 5 %
Neutro Abs: 8.4 10*3/uL — ABNORMAL HIGH (ref 1.4–6.5)
Neutrophils Relative %: 36 %
Platelets: 287 10*3/uL (ref 150–440)
RBC: 4.82 MIL/uL (ref 4.40–5.90)
RDW: 14.5 % (ref 11.5–14.5)
WBC: 23.3 10*3/uL — ABNORMAL HIGH (ref 3.8–10.6)

## 2016-06-23 LAB — BASIC METABOLIC PANEL
Anion gap: 8 (ref 5–15)
BUN: 10 mg/dL (ref 6–20)
CO2: 23 mmol/L (ref 22–32)
Calcium: 9.1 mg/dL (ref 8.9–10.3)
Chloride: 102 mmol/L (ref 101–111)
Creatinine, Ser: 0.87 mg/dL (ref 0.61–1.24)
GFR calc Af Amer: >60 mL/min (ref >60)
GFR calc non Af Amer: >60 mL/min (ref >60)
Glucose, Bld: 146 mg/dL — ABNORMAL HIGH (ref 65–99)
Potassium: 4.9 mmol/L (ref 3.5–5.1)
Sodium: 133 mmol/L — ABNORMAL LOW (ref 135–145)

## 2016-06-23 LAB — URIC ACID: Uric Acid, Serum: 5.5 mg/dL (ref 4.4–7.6)

## 2016-06-23 NOTE — Progress Notes (Signed)
Patient offers no complaints today. 

## 2016-12-22 ENCOUNTER — Other Ambulatory Visit: Payer: Medicare Other

## 2016-12-22 ENCOUNTER — Ambulatory Visit: Payer: Medicare Other | Admitting: Hematology and Oncology

## 2017-01-23 ENCOUNTER — Inpatient Hospital Stay: Payer: Medicare Other

## 2017-01-23 ENCOUNTER — Inpatient Hospital Stay: Payer: Medicare Other | Attending: Hematology and Oncology | Admitting: Hematology and Oncology

## 2017-01-23 ENCOUNTER — Encounter: Payer: Self-pay | Admitting: Hematology and Oncology

## 2017-01-23 VITALS — BP 127/74 | HR 86 | Temp 97.0°F | Resp 18 | Wt 226.5 lb

## 2017-01-23 DIAGNOSIS — E785 Hyperlipidemia, unspecified: Secondary | ICD-10-CM | POA: Diagnosis not present

## 2017-01-23 DIAGNOSIS — Z7984 Long term (current) use of oral hypoglycemic drugs: Secondary | ICD-10-CM

## 2017-01-23 DIAGNOSIS — Z79899 Other long term (current) drug therapy: Secondary | ICD-10-CM | POA: Insufficient documentation

## 2017-01-23 DIAGNOSIS — K589 Irritable bowel syndrome without diarrhea: Secondary | ICD-10-CM | POA: Insufficient documentation

## 2017-01-23 DIAGNOSIS — C9190 Lymphoid leukemia, unspecified not having achieved remission: Secondary | ICD-10-CM | POA: Insufficient documentation

## 2017-01-23 DIAGNOSIS — Z7982 Long term (current) use of aspirin: Secondary | ICD-10-CM | POA: Diagnosis not present

## 2017-01-23 DIAGNOSIS — N400 Enlarged prostate without lower urinary tract symptoms: Secondary | ICD-10-CM | POA: Insufficient documentation

## 2017-01-23 DIAGNOSIS — M129 Arthropathy, unspecified: Secondary | ICD-10-CM | POA: Diagnosis not present

## 2017-01-23 DIAGNOSIS — E119 Type 2 diabetes mellitus without complications: Secondary | ICD-10-CM | POA: Diagnosis not present

## 2017-01-23 DIAGNOSIS — C9110 Chronic lymphocytic leukemia of B-cell type not having achieved remission: Secondary | ICD-10-CM

## 2017-01-23 DIAGNOSIS — R51 Headache: Secondary | ICD-10-CM

## 2017-01-23 LAB — CBC WITH DIFFERENTIAL/PLATELET
Basophils Absolute: 0.2 10*3/uL — ABNORMAL HIGH (ref 0–0.1)
Basophils Relative: 1 %
Eosinophils Absolute: 0.6 10*3/uL (ref 0–0.7)
Eosinophils Relative: 3 %
HCT: 43.1 % (ref 40.0–52.0)
Hemoglobin: 14.6 g/dL (ref 13.0–18.0)
Lymphocytes Relative: 60 %
Lymphs Abs: 15.2 10*3/uL — ABNORMAL HIGH (ref 1.0–3.6)
MCH: 30.4 pg (ref 26.0–34.0)
MCHC: 33.8 g/dL (ref 32.0–36.0)
MCV: 90 fL (ref 80.0–100.0)
Monocytes Absolute: 1.2 10*3/uL — ABNORMAL HIGH (ref 0.2–1.0)
Monocytes Relative: 5 %
Neutro Abs: 7.6 10*3/uL — ABNORMAL HIGH (ref 1.4–6.5)
Neutrophils Relative %: 31 %
Platelets: 263 10*3/uL (ref 150–440)
RBC: 4.78 MIL/uL (ref 4.40–5.90)
RDW: 15.1 % — ABNORMAL HIGH (ref 11.5–14.5)
WBC: 24.9 10*3/uL — ABNORMAL HIGH (ref 3.8–10.6)

## 2017-01-23 LAB — BASIC METABOLIC PANEL
Anion gap: 9 (ref 5–15)
BUN: 11 mg/dL (ref 6–20)
CO2: 24 mmol/L (ref 22–32)
Calcium: 9.6 mg/dL (ref 8.9–10.3)
Chloride: 102 mmol/L (ref 101–111)
Creatinine, Ser: 1.08 mg/dL (ref 0.61–1.24)
GFR calc Af Amer: >60 mL/min (ref >60)
GFR calc non Af Amer: >60 mL/min (ref >60)
Glucose, Bld: 135 mg/dL — ABNORMAL HIGH (ref 65–99)
Potassium: 4.4 mmol/L (ref 3.5–5.1)
Sodium: 135 mmol/L (ref 135–145)

## 2017-01-23 LAB — URIC ACID: Uric Acid, Serum: 6.3 mg/dL (ref 4.4–7.6)

## 2017-01-23 NOTE — Progress Notes (Signed)
[facility] day:  01/23/17   Chief Complaint: Philip Robbins is a 76 y.o. male with stage 0 chronic lymphocytic leukemia who is seen for 6 month assessment.  HPI: The patient was last seen in the medical oncology clinic on 06/23/2016.  At that time, energy level was fair.  He had issues with irritable bowel.  He denied any fever or infections, bruising or bleeding, or adenopathy. Exam was table. CBC was stable.  During the interim, he has felt good. He notes being a little bit more tired secondary to his age.  His irritable bowel symptoms go up and down. He denies any melena or hematochezia.  He denies any B symptoms, adenopathy, bruising or bleeding or interval infections.   Past Medical History:  Diagnosis Date  . Arthritis    knee and back  . BPH (benign prostatic hypertrophy)   . DM type 2 (diabetes mellitus, type 2) ([place])   . Environmental allergies   . Headache    sinus headaches  . HLD (hyperlipidemia)     Past Surgical History:  Procedure Laterality Date  . CARDIAC CATHETERIZATION    . CARDIAC CATHETERIZATION     no stents placed  . KNEE ARTHROSCOPY Right 03/16/2015   Procedure: ARTHROSCOPY KNEE WITH PARTIAL MEDIAL MENISECTOMY, AND CHONDROPLASTY;  Surgeon: Leanor Kail, MD;  Location: [facility];  Service: Orthopedics;  Laterality: Right;  Diabetic - oral meds    Family History  Problem Relation Age of Onset  . Renal Disease Father        ESRD  . Diabetes Father   . Breast cancer Sister     Social History:  reports that he has never smoked. He has never used smokeless tobacco. He reports that he does not drink alcohol or use drugs.  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  Allergies: No Known Allergies  Current Medications: Current Outpatient Prescriptions  Medication Sig Dispense Refill  . acyclovir (ZOVIRAX) 200 MG capsule 400 mg once.    Marland Kitchen aspirin (ASPIRIN EC) 81 MG EC tablet Take 81 mg  by mouth daily. Swallow whole.    Marland Kitchen azelastine (ASTELIN) 0.1 % nasal [place] [address] 1 [place] into both nostrils 2 (two) times daily. Use in each nostril as directed    . cetirizine (ZYRTEC) 10 MG tablet Take 10 mg by mouth daily.    . fluticasone (FLONASE) 50 MCG/ACT nasal [place] [address] 2 sprays into both nostrils daily.    Marland Kitchen lisinopril (PRINIVIL,ZESTRIL) 10 MG tablet Take 10 mg by mouth daily.    Marland Kitchen loteprednol (LOTEMAX) 0.5 % ophthalmic suspension Place 1 drop into the right eye daily.    . metFORMIN (GLUCOPHAGE) 500 MG tablet Take by mouth 2 (two) times daily with a meal.    . pioglitazone (ACTOS) 45 MG tablet Take 45 mg by mouth daily.    . pravastatin (PRAVACHOL) 40 MG tablet Take 40 mg by mouth daily.    . Probiotic Product (PROBIOTIC ACIDOPHILUS) CAPS Take by mouth.    . sitaGLIPtin (JANUVIA) 50 MG tablet Take 50 mg by mouth daily.     No current facility-administered medications for this visit.     Review of Systems:  GENERAL:  Feels "good".  No fevers or sweats.  Weight down 2 pounds. PERFORMANCE STATUS (ECOG):  0 HEENT:  Dry eyes corrected with drops. No visual changes, sore throat, mouth sores or tenderness. Lungs: No shortness of breath or  cough.  No hemoptysis. Cardiac:  No chest pain, palpitations, orthopnea, or PND. GI:  Irritable bowel.  Intermittent diarrhea.  No nausea, vomiting, constipation, melena or hematochezia.  Colonoscopy 2-3 years ago.   GU:  No urgency, frequency, dysuria, or hematuria. Musculoskeletal:  No back pain.  No joint pain.  No muscle tenderness. Extremities:  No pain or swelling. Skin:  No rashes, ulcers or skin changes. Neuro:  No headache, numbness or weakness, balance or coordination issues. Endocrine:  Diabetes.  No thyroid issues, hot flashes or night sweats. Psych:  No mood changes, depression or anxiety. Pain:  No focal pain. Review of systems:  All other systems reviewed and found to be negative.  Physical Exam: Blood pressure 127/74, pulse 86,  temperature (!) 97 F (36.1 C), temperature source Tympanic, resp. rate 18, weight 226 lb 8 oz (102.7 kg). GENERAL:  Well developed, well nourished, gentleman sitting comfortably in the exam room in no acute distress. MENTAL STATUS:  Alert and oriented to person, place and time. HEAD:  Short gray hair.  Normocephalic, atraumatic, face symmetric, no Cushingoid features. EYES:  Glasses.  Blue eyes.  Pupils equal round and reactive to light and accomodation.  No conjunctivitis or scleral icterus. ENT:  Oropharynx clear without lesion.  Tongue normal. Mucous membranes moist.  RESPIRATORY:  Clear to auscultation without rales, wheezes or rhonchi. CARDIOVASCULAR:  Regular rate and rhythm without murmur, rub or gallop. ABDOMEN:  Soft, non-tender, with active bowel sounds, and no hepatomegaly.  Spleen tip barely palpable (stable).  No masses. SKIN:  No rashes, ulcers or lesions. EXTREMITIES: No edema, no skin discoloration or tenderness.  No palpable cords. LYMPH NODES: No palpable cervical, supraclavicular, axillary or inguinal adenopathy  NEUROLOGICAL: Unremarkable. PSYCH:  Appropriate.    Appointment on 01/23/2017  Component Date Value Ref Range Status  . WBC 01/23/2017 24.9* 3.8 - 10.6 K/uL Final  . RBC 01/23/2017 4.78  4.40 - 5.90 MIL/uL Final  . Hemoglobin 01/23/2017 14.6  13.0 - 18.0 g/dL Final  . HCT 01/23/2017 43.1  40.0 - 52.0 % Final  . MCV 01/23/2017 90.0  80.0 - 100.0 fL Final  . MCH 01/23/2017 30.4  26.0 - 34.0 pg Final  . MCHC 01/23/2017 33.8  32.0 - 36.0 g/dL Final  . RDW 01/23/2017 15.1* 11.5 - 14.5 % Final  . Platelets 01/23/2017 263  150 - 440 K/uL Final  . Neutrophils Relative % 01/23/2017 31  % Final  . Neutro Abs 01/23/2017 7.6* 1.4 - 6.5 K/uL Final  . Lymphocytes Relative 01/23/2017 60  % Final  . Lymphs Abs 01/23/2017 15.2* 1.0 - 3.6 K/uL Final  . Monocytes Relative 01/23/2017 5  % Final  . Monocytes Absolute 01/23/2017 1.2* 0.2 - 1.0 K/uL Final  . Eosinophils  Relative 01/23/2017 3  % Final  . Eosinophils Absolute 01/23/2017 0.6  0 - 0.7 K/uL Final  . Basophils Relative 01/23/2017 1  % Final  . Basophils Absolute 01/23/2017 0.2* 0 - 0.1 K/uL Final    Assessment:  Philip Robbins is a 76 y.o. male with stage 0 chronic lymphocytic leukemia.  He has had lymphocytosis since 04/11/2015.  WBC has ranged between 21,000 - 23,000.  Flow cytometry on 05/04/2015 confirmed CLL.  There was a population of monoclonal B-lymphocytes that were restricted to the dim expression of kappa light chain immunoglobulin.  The clonal B cells expressed CD5 and CD23.  Expression of CD20 was dim.  CD38 expression was in 55% of clonal B cells.  There  were no increased blasts.  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  Symptomatically, he denies any B symptoms.  Exam reveals no adenopathy.  Spleen tip is barely palpable.  Plan: 1.  Labs today:  CBC with diff, BMP, uric acid. 2.  RTC in 6 months for MD assessment and labs (CBC with diff, BMP, uric acid).   Lequita Asal, MD  01/23/2017 , 11:34 AM

## 2017-01-23 NOTE — Progress Notes (Signed)
Patient offers no complaints today. 

## 2017-06-28 IMAGING — MR MR KNEE*R* W/O CM
6 series · 36 of 40 positions shown · non-contrast
Comparison: None.

CLINICAL DATA: Injured knee 2 months ago getting out of the bath
tub. Persistent pain and swelling.

EXAM:
MRI OF THE RIGHT KNEE WITHOUT CONTRAST
TECHNIQUE: Multiplanar, multisequence MR imaging of the knee was performed. No
intravenous contrast was administered.

[Series 3: PD fat-sat · axial · 3.0mm · 0.50mm/px · z∈[-80,+35]mm · 9 of 36 slices shown (1 of 4)]
[im 1/36]
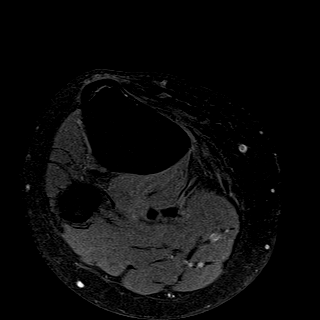
[im 5/36]
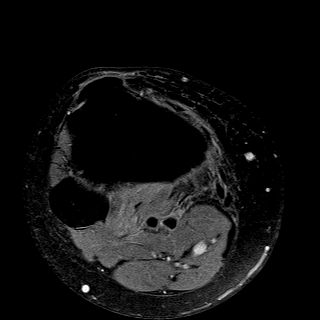
[im 9/36]
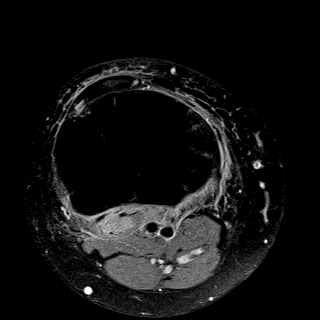
[im 14/36]
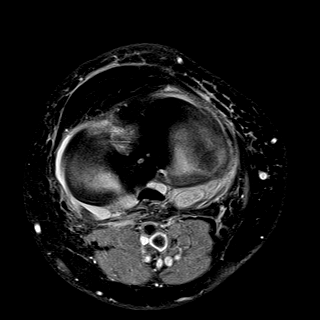
[im 18/36]
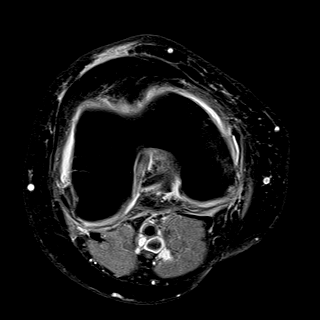
[im 22/36]
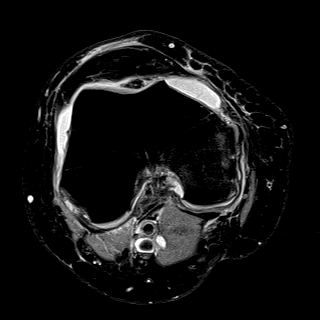
[im 27/36]
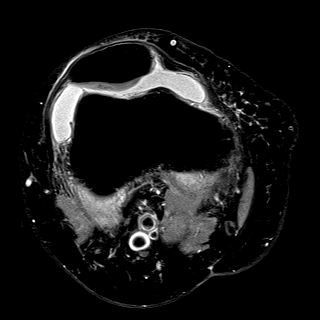
[im 31/36]
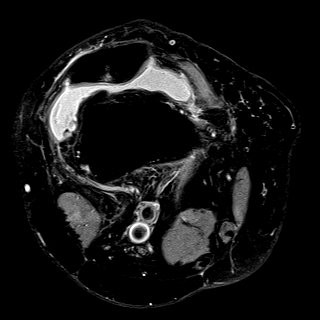
[im 36/36]
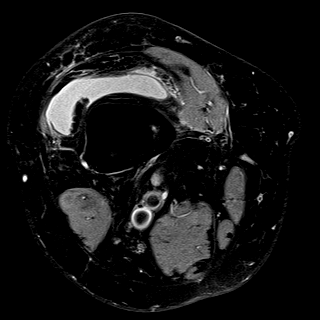

[Series 4: T1 · coronal · 3.0mm · 0.50mm/px · 3 of 32 slices shown]
[im 1/32]
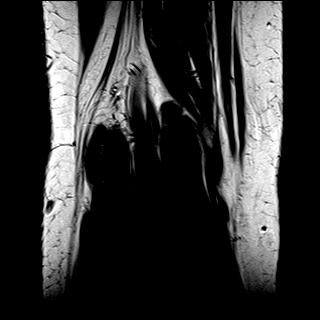
[im 6/32]
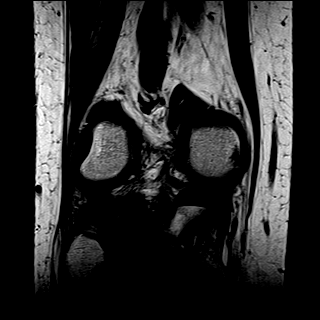
[im 11/32]
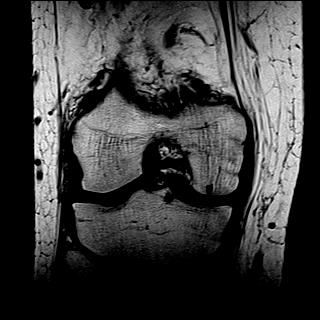

[Series 5: PD fat-sat · sagittal · 3.0mm · 0.50mm/px · 8 of 33 slices shown (2 of 4)]
[im 1/33]
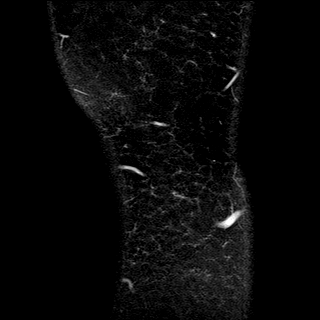
[im 5/33]
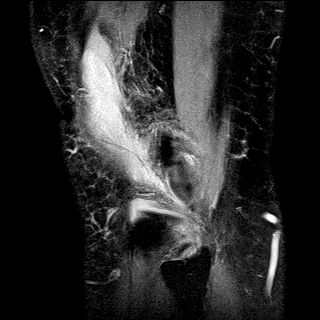
[im 10/33]
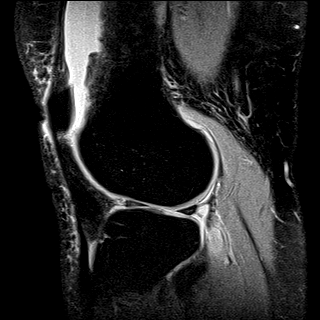
[im 14/33]
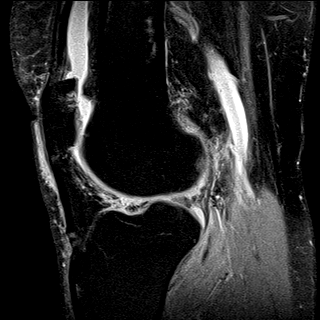
[im 19/33]
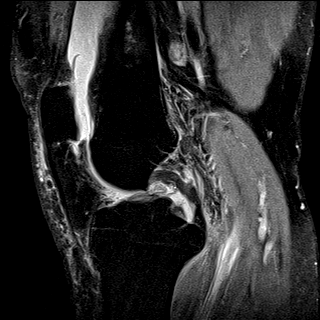
[im 23/33]
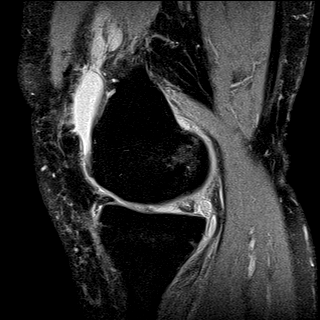
[im 28/33]
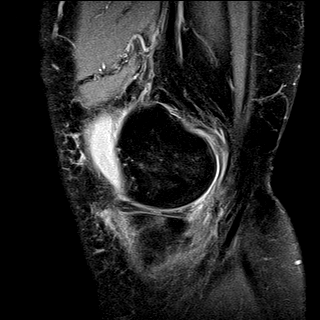
[im 33/33]
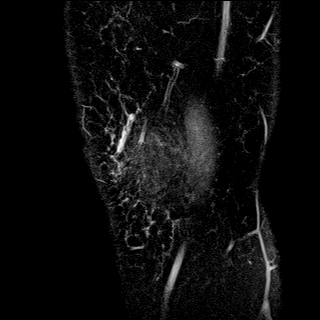

[Series 6: T2 fat-sat · coronal · 3.0mm · 0.31mm/px · 7 of 32 slices shown]
[im 1/32]
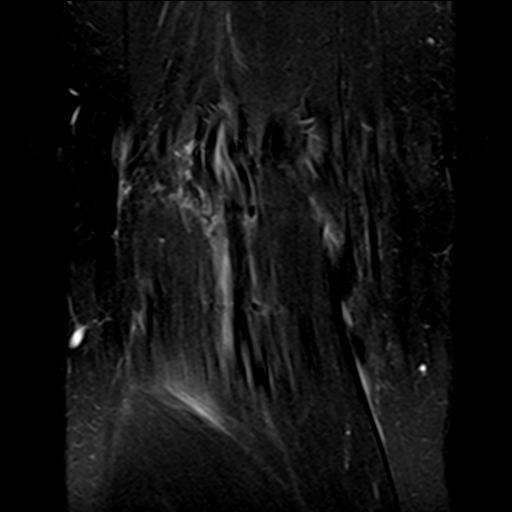
[im 6/32]
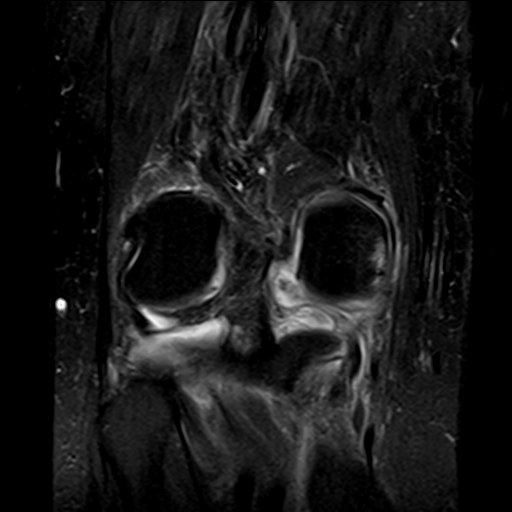
[im 11/32]
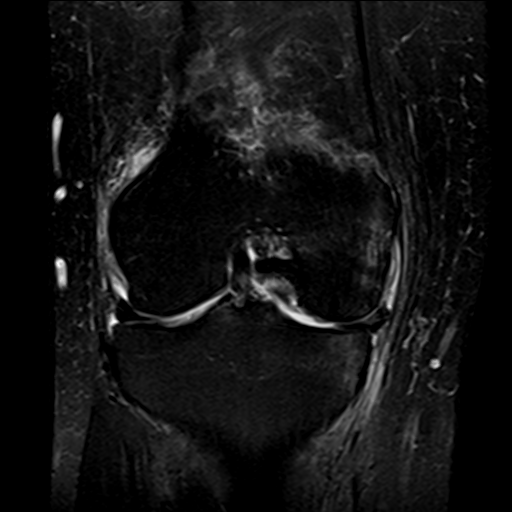
[im 16/32]
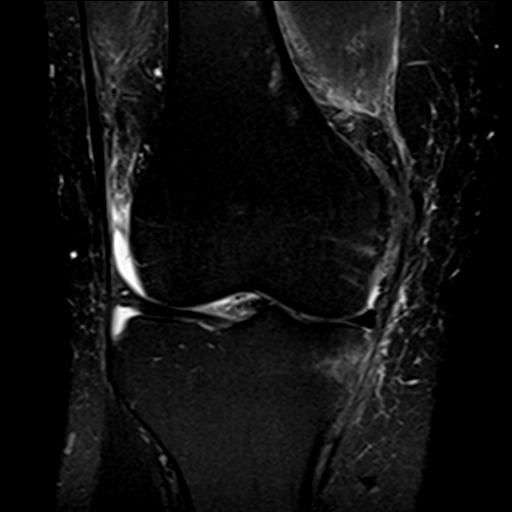
[im 21/32]
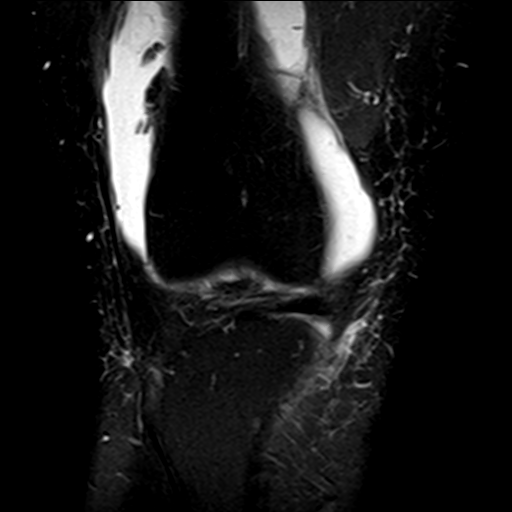
[im 26/32]
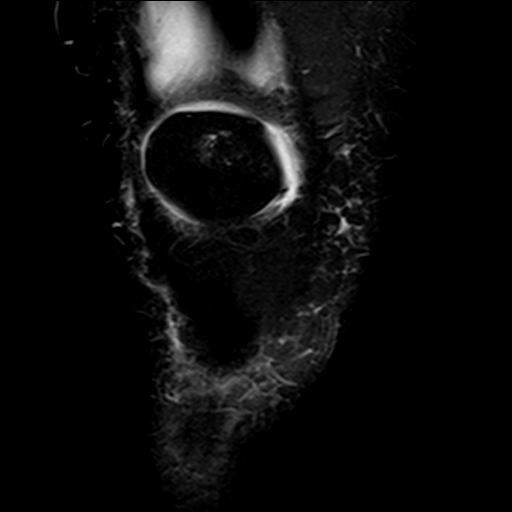
[im 32/32]
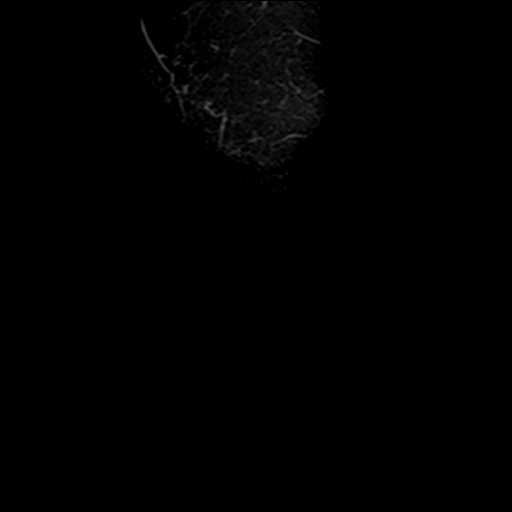

[Series 7: PD fat-sat · coronal · 3.0mm · 0.50mm/px · 7 of 32 slices shown (3 of 4)]
[im 1/32]
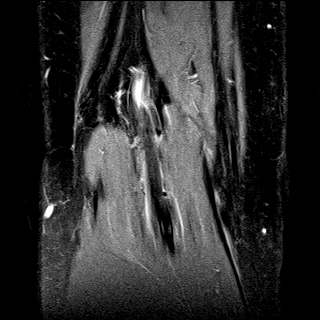
[im 6/32]
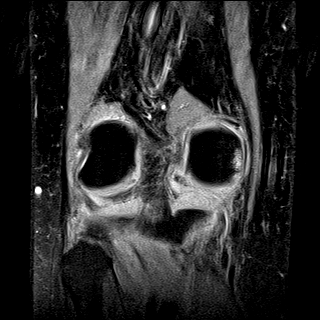
[im 11/32]
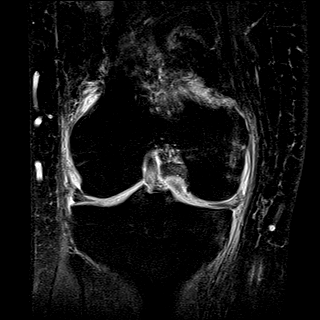
[im 16/32]
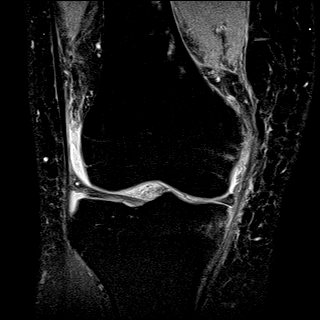
[im 21/32]
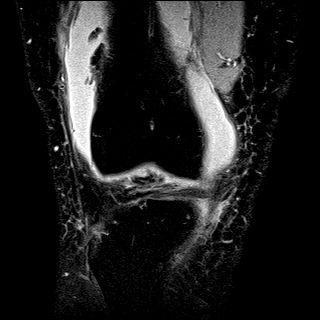
[im 26/32]
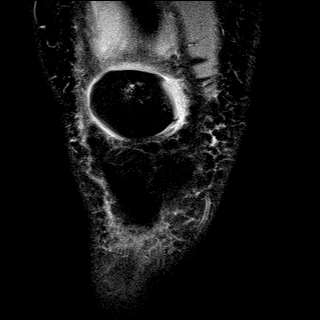
[im 32/32]
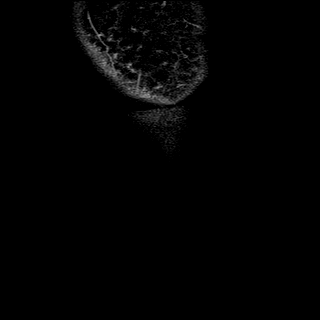

[Series 8: PD fat-sat · oblique · 2.0mm · 0.62mm/px · 2 of 11 slices shown (4 of 4)]
[im 1/11]
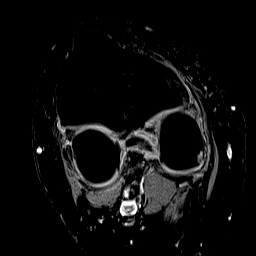
[im 11/11]
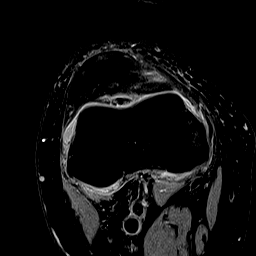

[36 of 40 positions shown; findings below may reference images not displayed]

FINDINGS: MENISCI

Medial meniscus: Superior articular surface and free edge tears
involving the posterior horn and mid body junction region.

Lateral meniscus:  Intact.  Intrasubstance degenerative changes.

LIGAMENTS

Cruciates:  Intact.

Collaterals:  Intact.  MCL and pes anserine bursitis.

CARTILAGE

Patellofemoral: Moderate degenerative chondrosis mainly involving
the patellar apex

Medial: Moderate degenerative chondrosis with areas of near
full-thickness cartilage loss. There is joint space narrowing and
osteophytic spurring.

Lateral:  Mild to moderate degenerative chondrosis.

Joint: Moderate to large joint effusion with moderate synovitis.
Areas of probable nodular synovitis in the posterior joint space.

Popliteal Fossa:  Small Baker's cyst.

Extensor Mechanism: The patella retinacular structures are intact
and the quadriceps and patellar tendons are intact. Mild distal
patellar tendinopathy.

Bones: Marrow edema in the medial femoral condyle and medial tibial
plateau may reflect resolving bone contusions.
IMPRESSION: 1. Medial meniscus tears as described above. The lateral meniscus is
intact.
2. Intact ligamentous structures.
3. Probable resolving medial femoral and medial tibial bone
contusions. No acute fracture.
4. Large joint effusion and moderate synovitis.
5. Tricompartmental degenerative changes.

## 2017-07-27 ENCOUNTER — Other Ambulatory Visit: Payer: Medicare Other

## 2017-07-27 ENCOUNTER — Ambulatory Visit: Payer: Medicare Other | Admitting: Hematology and Oncology

## 2017-07-28 ENCOUNTER — Inpatient Hospital Stay: Payer: Medicare Other | Attending: Hematology and Oncology

## 2017-07-28 ENCOUNTER — Inpatient Hospital Stay (HOSPITAL_BASED_OUTPATIENT_CLINIC_OR_DEPARTMENT_OTHER): Payer: Medicare Other | Admitting: Hematology and Oncology

## 2017-07-28 VITALS — BP 139/79 | HR 78 | Temp 97.2°F | Resp 18 | Wt 226.4 lb

## 2017-07-28 DIAGNOSIS — C9110 Chronic lymphocytic leukemia of B-cell type not having achieved remission: Secondary | ICD-10-CM

## 2017-07-28 LAB — CBC WITH DIFFERENTIAL/PLATELET
Basophils Absolute: 0.2 10*3/uL — ABNORMAL HIGH (ref 0–0.1)
Basophils Relative: 1 %
Eosinophils Absolute: 0.7 10*3/uL (ref 0–0.7)
Eosinophils Relative: 3 %
HCT: 45.8 % (ref 40.0–52.0)
Hemoglobin: 15.1 g/dL (ref 13.0–18.0)
Lymphocytes Relative: 62 %
Lymphs Abs: 16.1 10*3/uL — ABNORMAL HIGH (ref 1.0–3.6)
MCH: 29.9 pg (ref 26.0–34.0)
MCHC: 32.9 g/dL (ref 32.0–36.0)
MCV: 91 fL (ref 80.0–100.0)
Monocytes Absolute: 1.5 10*3/uL — ABNORMAL HIGH (ref 0.2–1.0)
Monocytes Relative: 6 %
Neutro Abs: 7.2 10*3/uL — ABNORMAL HIGH (ref 1.4–6.5)
Neutrophils Relative %: 28 %
Platelets: 302 10*3/uL (ref 150–440)
RBC: 5.03 MIL/uL (ref 4.40–5.90)
RDW: 15 % — ABNORMAL HIGH (ref 11.5–14.5)
WBC: 25.8 10*3/uL — ABNORMAL HIGH (ref 3.8–10.6)

## 2017-07-28 LAB — BASIC METABOLIC PANEL
Anion gap: 8 (ref 5–15)
BUN: 13 mg/dL (ref 6–20)
CO2: 25 mmol/L (ref 22–32)
Calcium: 9.7 mg/dL (ref 8.9–10.3)
Chloride: 101 mmol/L (ref 101–111)
Creatinine, Ser: 0.87 mg/dL (ref 0.61–1.24)
GFR calc Af Amer: >60 mL/min (ref >60)
GFR calc non Af Amer: >60 mL/min (ref >60)
Glucose, Bld: 125 mg/dL — ABNORMAL HIGH (ref 65–99)
Potassium: 5 mmol/L (ref 3.5–5.1)
Sodium: 134 mmol/L — ABNORMAL LOW (ref 135–145)

## 2017-07-28 LAB — URIC ACID: Uric Acid, Serum: 4.7 mg/dL (ref 4.4–7.6)

## 2017-07-28 NOTE — Progress Notes (Signed)
Patient offers no complaints today. 

## 2017-07-28 NOTE — Progress Notes (Signed)
[facility] day:  07/28/17   Chief Complaint: Philip Robbins is a 77 y.o. male with stage 0 chronic lymphocytic leukemia who is seen for 6 month assessment.  HPI: The patient was last seen in the medical oncology clinic on 01/23/2017.  At that time, he denied any B symptoms.  Exam revealed no adenopathy.  Spleen tip was barely palpable.  CBC revealed a hematocrit of 43.1, hemoglobin 14.6, platelets 263,000, WBC 24,900 with an Carlton of 7600.  ALC was 15,200.  During the interim, patient is doing "good".  atient denies any acute concerns today.  He feels well.  Patient has not experienced any B symptoms or interval infections.  He is eating well, and his weight has remained stable.    Past Medical History:  Diagnosis Date  . Arthritis    knee and back  . BPH (benign prostatic hypertrophy)   . DM type 2 (diabetes mellitus, type 2) ([place])   . Environmental allergies   . Headache    sinus headaches  . HLD (hyperlipidemia)     Past Surgical History:  Procedure Laterality Date  . CARDIAC CATHETERIZATION    . CARDIAC CATHETERIZATION     no stents placed  . KNEE ARTHROSCOPY Right 03/16/2015   Procedure: ARTHROSCOPY KNEE WITH PARTIAL MEDIAL MENISECTOMY, AND CHONDROPLASTY;  Surgeon: Leanor Kail, MD;  Location: [facility];  Service: Orthopedics;  Laterality: Right;  Diabetic - oral meds    Family History  Problem Relation Age of Onset  . Renal Disease Father        ESRD  . Diabetes Father   . Breast cancer Sister     Social History:  reports that  has never smoked. he has never used smokeless tobacco. He reports that he does not drink alcohol or use drugs.  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  Allergies: No Known Allergies  Current Medications: Current Outpatient Medications  Medication Sig Dispense Refill  . acyclovir (ZOVIRAX) 200 MG capsule 400 mg once.    Marland Kitchen aspirin (ASPIRIN EC) 81  MG EC tablet Take 81 mg by mouth daily. Swallow whole.    Marland Kitchen azelastine (ASTELIN) 0.1 % nasal [place] 1 [place] into both nostrils 2 (two) times daily. Use in each nostril as directed    . cetirizine (ZYRTEC) 10 MG tablet Take 10 mg by mouth daily.    . fluticasone (FLONASE) 50 MCG/ACT nasal [place]ier[address] Place 2 sprays into both nostrils daily.    Marland Kitchen lisinopril (PRINIVIL,ZESTRIL) 10 MG tablet Take 10 mg by mouth daily.    Marland Kitchen loteprednol (LOTEMAX) 0.5 % ophthalmic suspension Place 1 drop into the right eye daily.    . metFORMIN (GLUCOPHAGE) 500 MG tablet Take by mouth 2 (two) times daily with a meal.    . pioglitazone (ACTOS) 45 MG tablet Take 45 mg by mouth daily.    . pravastatin (PRAVACHOL) 40 MG tablet Take 40 mg by mouth daily.    . Probiotic Product (PROBIOTIC ACIDOPHILUS) CAPS Take by mouth.    . sitaGLIPtin (JANUVIA) 50 MG tablet Take 50 mg by mouth daily.     No current facility-administered medications for this visit.     Review of Systems:  GENERAL:  Feels "good".  No problems.  No fevers or sweats.  Weight stable.  PERFORMANCE STATUS (ECOG):  0 HEENT:  Dry eyes corrected with drops. No visual changes, sore throat,  mouth sores or tenderness. Lungs: No shortness of breath or cough.  No hemoptysis. Cardiac:  No chest pain, palpitations, orthopnea, or PND. GI:  Irritable bowel.  Intermittent diarrhea.  No nausea, vomiting, constipation, melena or hematochezia.  Colonoscopy 2-3 years ago.   GU:  No urgency, frequency, dysuria, or hematuria. Musculoskeletal:  No back pain.  No joint pain.  No muscle tenderness. Extremities:  No pain or swelling. Skin:  No rashes, ulcers or skin changes. Neuro:  No headache, numbness or weakness, balance or coordination issues. Endocrine:  Diabetes.  No thyroid issues, hot flashes or night sweats. Psych:  No mood changes, depression or anxiety. Pain:  No focal pain. Review of systems:  All other systems reviewed and found to be negative.  Physical  Exam: Blood pressure 139/79, pulse 78, temperature (!) 97.2 F (36.2 C), temperature source Tympanic, resp. rate 18, weight 226 lb 6 oz (102.7 kg). GENERAL:  Well developed, well nourished, gentleman sitting comfortably in the exam room in no acute distress. MENTAL STATUS:  Alert and oriented to person, place and time. HEAD:  Short gray hair.  Normocephalic, atraumatic, face symmetric, no Cushingoid features. EYES:  Glasses.  Blue eyes.  Pupils equal round and reactive to light and accomodation.  No conjunctivitis or scleral icterus. ENT:  Oropharynx clear without lesion.  Tongue normal. Mucous membranes moist.  RESPIRATORY:  Clear to auscultation without rales, wheezes or rhonchi. CARDIOVASCULAR:  Regular rate and rhythm without murmur, rub or gallop. ABDOMEN:  Soft, non-tender, with active bowel sounds, and no hepatomegaly.  Spleen tip not palpable.  No masses. SKIN:  No rashes, ulcers or lesions. EXTREMITIES: No edema, no skin discoloration or tenderness.  No palpable cords. LYMPH NODES: No palpable cervical, supraclavicular, axillary or inguinal adenopathy  NEUROLOGICAL: Unremarkable. PSYCH:  Appropriate.    Appointment on 07/28/2017  Component Date Value Ref Range Status  . WBC 07/28/2017 25.8* 3.8 - 10.6 K/uL Final  . RBC 07/28/2017 5.03  4.40 - 5.90 MIL/uL Final  . Hemoglobin 07/28/2017 15.1  13.0 - 18.0 g/dL Final  . HCT 07/28/2017 45.8  40.0 - 52.0 % Final  . MCV 07/28/2017 91.0  80.0 - 100.0 fL Final  . MCH 07/28/2017 29.9  26.0 - 34.0 pg Final  . MCHC 07/28/2017 32.9  32.0 - 36.0 g/dL Final  . RDW 07/28/2017 15.0* 11.5 - 14.5 % Final  . Platelets 07/28/2017 302  150 - 440 K/uL Final  . Neutrophils Relative % 07/28/2017 28  % Final  . Neutro Abs 07/28/2017 7.2* 1.4 - 6.5 K/uL Final  . Lymphocytes Relative 07/28/2017 62  % Final  . Lymphs Abs 07/28/2017 16.1* 1.0 - 3.6 K/uL Final  . Monocytes Relative 07/28/2017 6  % Final  . Monocytes Absolute 07/28/2017 1.5* 0.2 - 1.0  K/uL Final  . Eosinophils Relative 07/28/2017 3  % Final  . Eosinophils Absolute 07/28/2017 0.7  0 - 0.7 K/uL Final  . Basophils Relative 07/28/2017 1  % Final  . Basophils Absolute 07/28/2017 0.2* 0 - 0.1 K/uL Final   Performed at [facility], [address] [place], [place] [address]  . Sodium 07/28/2017 134* 135 - 145 mmol/L Final  . Potassium 07/28/2017 5.0  3.5 - 5.1 mmol/L Final  . Chloride 07/28/2017 101  101 - 111 mmol/L Final  . CO2 07/28/2017 25  22 - 32 mmol/L Final  . Glucose, Bld 07/28/2017 125* 65 - 99 mg/dL Final  . BUN 07/28/2017 13  6 - 20 mg/dL Final  .  Creatinine, Ser 07/28/2017 0.87  0.61 - 1.24 mg/dL Final  . Calcium 07/28/2017 9.7  8.9 - 10.3 mg/dL Final  . GFR calc non Af Amer 07/28/2017 >60  >60 mL/min Final  . GFR calc Af Amer 07/28/2017 >60  >60 mL/min Final   Comment: (NOTE) The eGFR has been calculated using the CKD EPI equation. This calculation has not been validated in all clinical situations. eGFR's persistently <60 mL/min signify possible Chronic Kidney Disease.   Georgiann Hahn gap 07/28/2017 8  5 - 15 Final   Performed at [facility], [address] [place], [place] [address]    Assessment:  OSHAE SIMMERING is a 77 y.o. male with stage 0 chronic lymphocytic leukemia.  He has had lymphocytosis since 04/11/2015.  WBC has ranged between 21,000 - 23,000.  Flow cytometry on 05/04/2015 confirmed CLL.  There was a population of monoclonal B-lymphocytes that were restricted to the dim expression of kappa light chain immunoglobulin.  The clonal B cells expressed CD5 and CD23.  Expression of CD20 was dim.  CD38 expression was in 55% of clonal B cells.  There were no increased blasts.  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  Symptomatically, he denies any B symptoms.  Exam reveals no adenopathy or hepatosplenomegaly. WBC 25,800 (stable).   Plan: 1.  Labs today:  CBC with diff, BMP, uric acid. 2.  RTC in 6 months for MD assessment and labs  (CBC with diff, BMP, uric acid).   Honor Loh, NP  07/28/2017 , 11:13 AM   I saw and evaluated the patient, participating in the key portions of the service and reviewing pertinent diagnostic studies and records.  I reviewed the nurse practitioner's note and agree with the findings and the plan.  The assessment and plan were discussed with the patient.  A few questions were asked by the patient and answered.   Nolon Stalls, MD 07/28/2017,11:13 AM

## 2017-08-15 ENCOUNTER — Encounter: Payer: Self-pay | Admitting: Hematology and Oncology

## 2017-09-10 ENCOUNTER — Telehealth: Payer: Self-pay | Admitting: *Deleted

## 2017-09-10 NOTE — Telephone Encounter (Signed)
Patient coming tomorrow at 2 PM

## 2017-09-10 NOTE — Progress Notes (Signed)
Philip Robbins day:  09/11/17   Chief Complaint: Philip Robbins is a 77 y.o. male with stage 0 chronic lymphocytic leukemia who is seen for sick call visit.  HPI: The patient was last seen in the medical oncology Robbins on 07/28/2017.  At that time, patient was doing well.  He denied any acute physical concerns.  He denied any B symptoms or interval infections.  Exam was unremarkable.  WBC was 25,800 with an Holland of 7200.  Hemoglobin was 15.1, hematocrit 45.8, and platelets 302,000.  Uric acid was 4.7.  Patient contacted the office on 09/10/2017 with left postauricular adenopathy.  Labs were done at his PCPs office and was advised to contact  oncology to discuss recommendations.  Office notes and vital signs were reviewed.  Patient denies being febrile.  Recorded temperature PCP was 98.88F.  CBC demonstrated a white count of 27,400.  Hemoglobin was 15.9 and hematocrit 48.9.  Symptomatically, patient has been having pain and swelling to a single lymph node behind his LEFT ear since Wednesday (09/09/2017). Patient states, "I woke up with it being sore. Then I thought it was a pimple or a cyst, but it kept getting worse so I went to my doctor".  Pain increases with normal ROM of the neck. He has had diarrhea for the last 2 days. Patient denies fevers, sweats, and significant weight loss.  Tenderness has improved today.  Patient eating well. His weight is down 4 pounds. Patient denies pain in the Robbins today.    Past Medical History:  Diagnosis Date  . Arthritis    knee and back  . BPH (benign prostatic hypertrophy)   . DM type 2 (diabetes mellitus, type 2) ([place])   . Environmental allergies   . Headache    sinus headaches  . HLD (hyperlipidemia)     Past Surgical History:  Procedure Laterality Date  . CARDIAC CATHETERIZATION    . CARDIAC CATHETERIZATION     no stents placed  . KNEE ARTHROSCOPY Right 03/16/2015   Procedure: ARTHROSCOPY KNEE WITH  PARTIAL MEDIAL MENISECTOMY, AND CHONDROPLASTY;  Surgeon: Leanor Kail, MD;  Location: [facility];  Service: Orthopedics;  Laterality: Right;  Diabetic - oral meds    Family History  Problem Relation Age of Onset  . Renal Disease Father        ESRD  . Diabetes Father   . Breast cancer Sister     Social History:  reports that  has never smoked. he has never used smokeless tobacco. He reports that he does not drink alcohol or use drugs.  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  Allergies: No Known Allergies  Current Medications: Current Outpatient Medications  Medication Sig Dispense Refill  . acyclovir (ZOVIRAX) 200 MG capsule 400 mg once.    Marland Kitchen aspirin (ASPIRIN EC) 81 MG EC tablet Take 81 mg by mouth daily. Swallow whole.    Marland Kitchen azelastine (ASTELIN) 0.1 % nasal [address] Place 1 [place] into both nostrils 2 (two) times daily. Use in each nostril as directed    . cetirizine (ZYRTEC) 10 MG tablet Take 10 mg by mouth daily.    . fluticasone (FLONASE) 50 MCG/ACT nasal [address] Place 2 sprays into both nostrils daily.    Marland Kitchen lisinopril (PRINIVIL,ZESTRIL) 10 MG tablet Take 10 mg by mouth daily.    . metFORMIN (GLUCOPHAGE) 500 MG tablet Take by mouth 2 (two) times daily  with a meal.    . pioglitazone (ACTOS) 45 MG tablet Take 45 mg by mouth daily.    . pravastatin (PRAVACHOL) 40 MG tablet Take 40 mg by mouth daily.    . Probiotic Product (PROBIOTIC ACIDOPHILUS) CAPS Take by mouth.    . sitaGLIPtin (JANUVIA) 50 MG tablet Take 50 mg by mouth daily.     No current facility-administered medications for this visit.     Review of Systems:  GENERAL:  Feels "good".  No problems.  No fevers or sweats.  Weight down 4 pounds.  PERFORMANCE STATUS (ECOG):  0 HEENT:  Dry eyes corrected with drops. No visual changes, sore throat, mouth sores or tenderness. Lungs: No shortness of breath or cough.  No hemoptysis. Cardiac:  No chest pain, palpitations, orthopnea, or  PND. GI:  Irritable bowel.  Intermittent diarrhea.  No nausea, vomiting, constipation, melena or hematochezia.  Colonoscopy 2-3 years ago.   GU:  No urgency, frequency, dysuria, or hematuria. Musculoskeletal:  No back pain.  No joint pain.  No muscle tenderness. Extremities:  No pain or swelling. Skin:  No rashes, ulcers or skin changes. Neuro:  No headache, numbness or weakness, balance or coordination issues. Endocrine:  Diabetes.  No thyroid issues, hot flashes or night sweats. Psych:  No mood changes, depression or anxiety. Pain:  No focal pain. Review of systems:  All other systems reviewed and found to be negative.  Physical Exam: Blood pressure 127/80, pulse 90, temperature (!) 97.3 F (36.3 C), temperature source Tympanic, resp. rate 20, weight 222 lb 4 oz (100.8 kg). GENERAL:  Well developed, well nourished, gentleman sitting comfortably in the exam room in no acute distress. MENTAL STATUS:  Alert and oriented to person, place and time. HEAD:  Short gray hair.  Normocephalic, atraumatic, face symmetric, no Cushingoid features. EYES:  Glasses.  Blue eyes.  Pupils equal round and reactive to light and accomodation.  No conjunctivitis or scleral icterus. ENT:  Oropharynx clear without lesion.  Tongue normal. Mucous membranes moist.  RESPIRATORY:  Clear to auscultation without rales, wheezes or rhonchi. CARDIOVASCULAR:  Regular rate and rhythm without murmur, rub or gallop. ABDOMEN:  Soft, non-tender, with active bowel sounds, and no hepatomegaly.  Spleen tip not palpable.  No masses. SKIN:  No rashes, ulcers or lesions. EXTREMITIES: No edema, no skin discoloration or tenderness.  No palpable cords. LYMPH NODES: Small tender LEFT supraclavicular and LEFT periauricular adenopathy. No palpable cervical, axillary or inguinal adenopathy  NEUROLOGICAL: Unremarkable. PSYCH:  Appropriate.    No visits with results within 3 Day(s) from this visit.  Latest known visit with results is:   Appointment on 07/28/2017  Component Date Value Ref Range Status  . Uric Acid, Serum 07/28/2017 4.7  4.4 - 7.6 mg/dL Final   Performed at [facility], [address] [place], [place] [address]  . WBC 07/28/2017 25.8* 3.8 - 10.6 K/uL Final  . RBC 07/28/2017 5.03  4.40 - 5.90 MIL/uL Final  . Hemoglobin 07/28/2017 15.1  13.0 - 18.0 g/dL Final  . HCT 07/28/2017 45.8  40.0 - 52.0 % Final  . MCV 07/28/2017 91.0  80.0 - 100.0 fL Final  . MCH 07/28/2017 29.9  26.0 - 34.0 pg Final  . MCHC 07/28/2017 32.9  32.0 - 36.0 g/dL Final  . RDW 07/28/2017 15.0* 11.5 - 14.5 % Final  . Platelets 07/28/2017 302  150 - 440 K/uL Final  . Neutrophils Relative % 07/28/2017 28  % Final  . Neutro Abs 07/28/2017  7.2* 1.4 - 6.5 K/uL Final  . Lymphocytes Relative 07/28/2017 62  % Final  . Lymphs Abs 07/28/2017 16.1* 1.0 - 3.6 K/uL Final  . Monocytes Relative 07/28/2017 6  % Final  . Monocytes Absolute 07/28/2017 1.5* 0.2 - 1.0 K/uL Final  . Eosinophils Relative 07/28/2017 3  % Final  . Eosinophils Absolute 07/28/2017 0.7  0 - 0.7 K/uL Final  . Basophils Relative 07/28/2017 1  % Final  . Basophils Absolute 07/28/2017 0.2* 0 - 0.1 K/uL Final   Performed at [facility], [address] [place], [place] [address]  . Sodium 07/28/2017 134* 135 - 145 mmol/L Final  . Potassium 07/28/2017 5.0  3.5 - 5.1 mmol/L Final  . Chloride 07/28/2017 101  101 - 111 mmol/L Final  . CO2 07/28/2017 25  22 - 32 mmol/L Final  . Glucose, Bld 07/28/2017 125* 65 - 99 mg/dL Final  . BUN 07/28/2017 13  6 - 20 mg/dL Final  . Creatinine, Ser 07/28/2017 0.87  0.61 - 1.24 mg/dL Final  . Calcium 07/28/2017 9.7  8.9 - 10.3 mg/dL Final  . GFR calc non Af Amer 07/28/2017 >60  >60 mL/min Final  . GFR calc Af Amer 07/28/2017 >60  >60 mL/min Final   Comment: (NOTE) The eGFR has been calculated using the CKD EPI equation. This calculation has not been validated in all clinical situations. eGFR's persistently <60 mL/min signify  possible Chronic Kidney Disease.   Georgiann Hahn gap 07/28/2017 8  5 - 15 Final   Performed at [facility], [address] [place], [place] [address]    Assessment:  Philip Robbins is a 77 y.o. male with stage 0 chronic lymphocytic leukemia.  He has had lymphocytosis since 04/11/2015.  WBC has ranged between 21,000 - 23,000.  Flow cytometry on 05/04/2015 confirmed CLL.  There was a population of monoclonal B-lymphocytes that were restricted to the dim expression of kappa light chain immunoglobulin.  The clonal B cells expressed CD5 and CD23.  Expression of CD20 was dim.  CD38 expression was in 55% of clonal B cells.  There were no increased blasts.  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  Symptomatically, patient has tender LEFT supraclavicular and periauricular adenopathy.  He denies any recent infections and any B symptoms. WBC was 27,0000 (stable) on 09/10/2017.Marland Kitchen   Plan: 1.  Discuss adenopathy normal in CLL. Lymph nodes may wax and wane.  Tender nodes raises concern for infection.  No current evidence of infection.  In the absence of B symptoms, there is no indication for imaging or treatment at this time. Discuss surveillance and potential need for imaging if areas continue to enlarge or if he develops symptoms. 2.  RTC in 6 weeks for MD assessment and labs (CBC with diff, uric acid, CMP, LDH). 3.  Keep 6 month appointment that patient already has for MD assessment and labs (CBC with diff, BMP, uric acid).   Honor Loh, NP  09/11/2017 , 2:42 PM   I saw and evaluated the patient, participating in the key portions of the service and reviewing pertinent diagnostic studies and records.  I reviewed the nurse practitioner's note and agree with the findings and the plan.  The assessment and plan were discussed with the patient.  A few questions were asked by the patient and answered.   Nolon Stalls, MD 09/11/2017,2:42 PM

## 2017-09-10 NOTE — Telephone Encounter (Signed)
Patient went to urgent care and has swollen lymph nodes behind his l ear and they did labs that are in line with what we had checked. He was recommended to contact our office. His next appointment is not until July. Per Dr Humberto Seals, schedule patient appointment next available

## 2017-09-11 ENCOUNTER — Encounter: Payer: Self-pay | Admitting: Hematology and Oncology

## 2017-09-11 ENCOUNTER — Inpatient Hospital Stay: Payer: Medicare Other | Attending: Hematology and Oncology | Admitting: Hematology and Oncology

## 2017-09-11 VITALS — BP 127/80 | HR 90 | Temp 97.3°F | Resp 20 | Wt 222.2 lb

## 2017-09-11 DIAGNOSIS — R591 Generalized enlarged lymph nodes: Secondary | ICD-10-CM

## 2017-09-11 DIAGNOSIS — R599 Enlarged lymph nodes, unspecified: Secondary | ICD-10-CM

## 2017-09-11 DIAGNOSIS — C9110 Chronic lymphocytic leukemia of B-cell type not having achieved remission: Secondary | ICD-10-CM | POA: Insufficient documentation

## 2017-09-11 NOTE — Progress Notes (Signed)
Patient here today for enlarged lymph node behind his left ear.  States it has been painful at times, but he has taken tylenol and held hot compresses.  Reports no pain today.

## 2017-09-20 DIAGNOSIS — R599 Enlarged lymph nodes, unspecified: Secondary | ICD-10-CM | POA: Insufficient documentation

## 2017-09-20 DIAGNOSIS — R591 Generalized enlarged lymph nodes: Secondary | ICD-10-CM | POA: Insufficient documentation

## 2017-10-23 ENCOUNTER — Other Ambulatory Visit: Payer: Medicare Other

## 2017-10-23 ENCOUNTER — Ambulatory Visit: Payer: Medicare Other | Admitting: Hematology and Oncology

## 2017-11-04 NOTE — Progress Notes (Addendum)
[facility] day:  11/05/17   Chief Complaint: Philip Robbins is a 77 y.o. male with stage 0 chronic lymphocytic leukemia who is seen for  6 week assessment.  HPI: The patient was last seen in the medical oncology clinic on 09/11/2017.  At that time, patient complained of pain and swelling with single LEFT periauricular lymph node since 09/09/2017.  Pain noted to increase with range of motion of the neck.  He also complained of a 2-day history of diarrhea.  He denied B symptoms.  Exam revealed tender LEFT supraclavicular and little periauricular adenopathy.  WBC was 27,000.  In the interim, patient is doing well. He notes that the tenderness in his LEFT neck and periauricular area has resolved. He states, "It only bothered me a couple of days". Patient has had no further diarrhea. Patient denies bleeding; no hematochezia, melena, or gross hematuria. Patient denies new areas of palpable adenopathy.  Patient is eating well. His weight has increased by 2 pounds. Patient denies pain in the clinic today.    Past Medical History:  Diagnosis Date  . Arthritis    knee and back  . BPH (benign prostatic hypertrophy)   . DM type 2 (diabetes mellitus, type 2) ([place])   . Environmental allergies   . Headache    sinus headaches  . HLD (hyperlipidemia)     Past Surgical History:  Procedure Laterality Date  . CARDIAC CATHETERIZATION    . CARDIAC CATHETERIZATION     no stents placed  . KNEE ARTHROSCOPY Right 03/16/2015   Procedure: ARTHROSCOPY KNEE WITH PARTIAL MEDIAL MENISECTOMY, AND CHONDROPLASTY;  Surgeon: Leanor Kail, MD;  Location: [facility];  Service: Orthopedics;  Laterality: Right;  Diabetic - oral meds    Family History  Problem Relation Age of Onset  . Renal Disease Father        ESRD  . Diabetes Father   . Breast cancer Sister     Social History:  reports that he has never smoked. He has never used smokeless tobacco. He reports  that he does not drink alcohol or use drugs.  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  Allergies: No Known Allergies  Current Medications: Current Outpatient Medications  Medication Sig Dispense Refill  . acyclovir (ZOVIRAX) 200 MG capsule 400 mg once.    Marland Kitchen aspirin (ASPIRIN EC) 81 MG EC tablet Take 81 mg by mouth daily. Swallow whole.    . cetirizine (ZYRTEC) 10 MG tablet Take 10 mg by mouth daily.    . fluticasone (FLONASE) 50 MCG/ACT nasal [place] 2 sprays into both nostrils daily.    Marland Kitchen lisinopril (PRINIVIL,ZESTRIL) 10 MG tablet Take 10 mg by mouth daily.    . metFORMIN (GLUCOPHAGE) 500 MG tablet Take by mouth 2 (two) times daily with a meal.    . pioglitazone (ACTOS) 45 MG tablet Take 45 mg by mouth daily.    . pravastatin (PRAVACHOL) 40 MG tablet Take 40 mg by mouth daily.    . Probiotic Product (PROBIOTIC ACIDOPHILUS) CAPS Take by mouth.    . sitaGLIPtin (JANUVIA) 50 MG tablet Take 50 mg by mouth daily.    Marland Kitchen azelastine (ASTELIN) 0.1 % nasal [place] [place] into both nostrils 2 (two) times daily. Use in each nostril as directed     No current facility-administered medications for this visit.     Review of Systems:  GENERAL:  Feels good.  Active.  No fevers, sweats or weight loss.  Weight up 2 pounds. PERFORMANCE STATUS (ECOG):  0 HEENT:  Dry eyes.  No visual changes, runny nose, sore throat, mouth sores or tenderness. Lungs: No shortness of breath or cough.  No hemoptysis. Cardiac:  No chest pain, palpitations, orthopnea, or PND. GI:  Irritable bowel.  No nausea, vomiting, diarrhea, constipation, melena or hematochezia. GU:  No urgency, frequency, dysuria, or hematuria. Musculoskeletal:  No back pain.  No joint pain.  No muscle tenderness. Extremities:  No pain or swelling. Skin:  No rashes or skin changes. Neuro:  No headache, numbness or weakness, balance or coordination issues. Endocrine:  Diabetes.  No thyroid issues, hot  flashes or night sweats. Psych:  No mood changes, depression or anxiety. Pain:  No focal pain. Review of systems:  All other systems reviewed and found to be negative.   Physical Exam: Blood pressure 129/69, pulse 76, temperature 98.3 F (36.8 C), temperature source Tympanic, resp. rate 18, weight 224 lb 9.6 oz (101.9 kg). GENERAL:  Well developed, well nourished, gentleman sitting comfortably in the exam room in no acute distress. MENTAL STATUS:  Alert and oriented to person, place and time. HEAD:  Short gray hair.  Normocephalic, atraumatic, face symmetric, no Cushingoid features. EYES:  Glasses.  Blue eyes.  Pupils equal round and reactive to light and accomodation.  No conjunctivitis or scleral icterus. ENT:  Oropharynx clear without lesion.  Tongue normal. Mucous membranes moist.  RESPIRATORY:  Clear to auscultation without rales, wheezes or rhonchi. CARDIOVASCULAR:  Regular rate and rhythm without murmur, rub or gallop. ABDOMEN:  Soft, non-tender, with active bowel sounds, and no hepatosplenomegaly.  No masses. SKIN:  No rashes, ulcers or lesions. EXTREMITIES: No edema, no skin discoloration or tenderness.  No palpable cords. LYMPH NODES: No palpable cervical, supraclavicular, axillary or inguinal adenopathy  NEUROLOGICAL: Unremarkable. PSYCH:  Appropriate.    Appointment on 11/05/2017  Component Date Value Ref Range Status  . LDH 11/05/2017 147  98 - 192 U/L Final   Performed at [facility], [address] [place], [place] [address]  . Sodium 11/05/2017 135  135 - 145 mmol/L Final  . Potassium 11/05/2017 4.7  3.5 - 5.1 mmol/L Final  . Chloride 11/05/2017 103  101 - 111 mmol/L Final  . CO2 11/05/2017 23  22 - 32 mmol/L Final  . Glucose, Bld 11/05/2017 134* 65 - 99 mg/dL Final  . BUN 11/05/2017 15  6 - 20 mg/dL Final  . Creatinine, Ser 11/05/2017 0.89  0.61 - 1.24 mg/dL Final  . Calcium 11/05/2017 9.8  8.9 - 10.3 mg/dL Final  . Total Protein 11/05/2017 7.4  6.5 - 8.1  g/dL Final  . Albumin 11/05/2017 4.2  3.5 - 5.0 g/dL Final  . AST 11/05/2017 25  15 - 41 U/L Final  . ALT 11/05/2017 22  17 - 63 U/L Final  . Alkaline Phosphatase 11/05/2017 45  38 - 126 U/L Final  . Total Bilirubin 11/05/2017 0.8  0.3 - 1.2 mg/dL Final  . GFR calc non Af Amer 11/05/2017 >60  >60 mL/min Final  . GFR calc Af Amer 11/05/2017 >60  >60 mL/min Final   Comment: (NOTE) The eGFR has been calculated using the CKD EPI equation. This calculation has not been validated in all clinical situations. eGFR's persistently <60 mL/min signify possible Chronic Kidney Disease.   . Anion gap 11/05/2017 9  5 - 15 Final   Performed at [facility],  [address] [place], [place] [address]  . WBC 11/05/2017 23.5* 3.8 - 10.6 K/uL Final  . RBC 11/05/2017 4.74  4.40 - 5.90 MIL/uL Final  . Hemoglobin 11/05/2017 14.5  13.0 - 18.0 g/dL Final  . HCT 11/05/2017 42.8  40.0 - 52.0 % Final  . MCV 11/05/2017 90.2  80.0 - 100.0 fL Final  . MCH 11/05/2017 30.5  26.0 - 34.0 pg Final  . MCHC 11/05/2017 33.8  32.0 - 36.0 g/dL Final  . RDW 11/05/2017 14.8* 11.5 - 14.5 % Final  . Platelets 11/05/2017 295  150 - 440 K/uL Final  . Neutrophils Relative % 11/05/2017 27  % Final  . Neutro Abs 11/05/2017 6.4  1.4 - 6.5 K/uL Final  . Lymphocytes Relative 11/05/2017 64  % Final  . Lymphs Abs 11/05/2017 15.1* 1.0 - 3.6 K/uL Final  . Monocytes Relative 11/05/2017 5  % Final  . Monocytes Absolute 11/05/2017 1.3* 0.2 - 1.0 K/uL Final  . Eosinophils Relative 11/05/2017 3  % Final  . Eosinophils Absolute 11/05/2017 0.6  0 - 0.7 K/uL Final  . Basophils Relative 11/05/2017 1  % Final  . Basophils Absolute 11/05/2017 0.2* 0 - 0.1 K/uL Final   Performed at [facility], [address] [place], [place] [address]    Assessment:  COREY LASKI is a 77 y.o. male with stage 0 chronic lymphocytic leukemia.  He has had lymphocytosis since 04/11/2015.  WBC has ranged between 21,000 - 23,000.  Flow cytometry on  05/04/2015 confirmed CLL.  There was a population of monoclonal B-lymphocytes that were restricted to the dim expression of kappa light chain immunoglobulin.  The clonal B cells expressed CD5 and CD23.  Expression of CD20 was dim.  CD38 expression was in 55% of clonal B cells.  There were no increased blasts.  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  Symptomatically, he is doing well. LEFT supraclavicular and periauricular adenopathy have resolved. He denies any recent infections and any B symptoms. WBC is 23,500 (stable).  Plan: 1.  Labs today: CBC with differential, uric acid, CMP, LDH 2.  Discuss waxing and waning adenopathy in CLL. No current indication for treatment.  Discuss plan to return to every 6 month surveillance.  Patient to call if any concerns.  Briefly reviewed indications for treatment. 3.  Keep 6 month appointment that patient already has for MD assessment and labs (CBC with diff, BMP, uric acid).   Honor Loh, NP  11/05/2017 , 11:00 AM   I saw and evaluated the patient, participating in the key portions of the service and reviewing pertinent diagnostic studies and records.  I reviewed the nurse practitioner's note and agree with the findings and the plan.  The assessment and plan were discussed with the patient.  A few questions were asked by the patient and answered.   Nolon Stalls, MD 11/05/2017,11:00 AM

## 2017-11-05 ENCOUNTER — Inpatient Hospital Stay (HOSPITAL_BASED_OUTPATIENT_CLINIC_OR_DEPARTMENT_OTHER): Payer: Medicare Other | Admitting: Hematology and Oncology

## 2017-11-05 ENCOUNTER — Inpatient Hospital Stay: Payer: Medicare Other | Attending: Hematology and Oncology

## 2017-11-05 ENCOUNTER — Encounter: Payer: Self-pay | Admitting: Hematology and Oncology

## 2017-11-05 ENCOUNTER — Other Ambulatory Visit: Payer: Self-pay

## 2017-11-05 VITALS — BP 129/69 | HR 76 | Temp 98.3°F | Resp 18 | Wt 224.6 lb

## 2017-11-05 DIAGNOSIS — C9110 Chronic lymphocytic leukemia of B-cell type not having achieved remission: Secondary | ICD-10-CM | POA: Diagnosis not present

## 2017-11-05 LAB — COMPREHENSIVE METABOLIC PANEL
ALT: 22 U/L (ref 17–63)
AST: 25 U/L (ref 15–41)
Albumin: 4.2 g/dL (ref 3.5–5.0)
Alkaline Phosphatase: 45 U/L (ref 38–126)
Anion gap: 9 (ref 5–15)
BUN: 15 mg/dL (ref 6–20)
CO2: 23 mmol/L (ref 22–32)
Calcium: 9.8 mg/dL (ref 8.9–10.3)
Chloride: 103 mmol/L (ref 101–111)
Creatinine, Ser: 0.89 mg/dL (ref 0.61–1.24)
GFR calc Af Amer: >60 mL/min (ref >60)
GFR calc non Af Amer: >60 mL/min (ref >60)
Glucose, Bld: 134 mg/dL — ABNORMAL HIGH (ref 65–99)
Potassium: 4.7 mmol/L (ref 3.5–5.1)
Sodium: 135 mmol/L (ref 135–145)
Total Bilirubin: 0.8 mg/dL (ref 0.3–1.2)
Total Protein: 7.4 g/dL (ref 6.5–8.1)

## 2017-11-05 LAB — CBC WITH DIFFERENTIAL/PLATELET
Basophils Absolute: 0.2 10*3/uL — ABNORMAL HIGH (ref 0–0.1)
Basophils Relative: 1 %
Eosinophils Absolute: 0.6 10*3/uL (ref 0–0.7)
Eosinophils Relative: 3 %
HCT: 42.8 % (ref 40.0–52.0)
Hemoglobin: 14.5 g/dL (ref 13.0–18.0)
Lymphocytes Relative: 64 %
Lymphs Abs: 15.1 10*3/uL — ABNORMAL HIGH (ref 1.0–3.6)
MCH: 30.5 pg (ref 26.0–34.0)
MCHC: 33.8 g/dL (ref 32.0–36.0)
MCV: 90.2 fL (ref 80.0–100.0)
Monocytes Absolute: 1.3 10*3/uL — ABNORMAL HIGH (ref 0.2–1.0)
Monocytes Relative: 5 %
Neutro Abs: 6.4 10*3/uL (ref 1.4–6.5)
Neutrophils Relative %: 27 %
Platelets: 295 10*3/uL (ref 150–440)
RBC: 4.74 MIL/uL (ref 4.40–5.90)
RDW: 14.8 % — ABNORMAL HIGH (ref 11.5–14.5)
WBC: 23.5 10*3/uL — ABNORMAL HIGH (ref 3.8–10.6)

## 2017-11-05 LAB — LACTATE DEHYDROGENASE: LDH: 147 U/L (ref 98–192)

## 2017-11-05 LAB — URIC ACID: Uric Acid, Serum: 6.1 mg/dL (ref 4.4–7.6)

## 2017-11-05 NOTE — Progress Notes (Signed)
Patient here for follow up

## 2018-01-24 NOTE — Progress Notes (Signed)
[facility] day:  01/25/2018   Chief Complaint: Philip Robbins is a 77 y.o. male with stage 0 chronic lymphocytic leukemia who is seen for 3 month assessment.  HPI: The patient was last seen in the medical oncology clinic on 11/05/2017.  At that time, patient was doing well. Previously reported tender adenopathy to his LEFT neck and periauricular area had resolved. No other complaints. Exam was unremarkable.  WBC was 23,500 (stable).  Patient was seen by Dr. Thereasa Distance (PSP) on 01/22/2018. Notes reviewed. Patient doing well overall. He complained of nocturia associated with his BPH. He was started on tamulosin. Hgb A1c 7.6. Chemistries normal. PSA 1.49 (improved). He is scheduled to follow up with PCP in 6 months.   In the interim, patient has been doing well.  He denies acute concerns.  There are no new areas of appreciable adenopathy noted. Previously reported tender lymphadenopathy noted to LEFT neck and preauricular area has completely resolved without recurrence.  Patient notes that he is feeling good.  Patient denies that he has experienced any B symptoms. He denies any interval infections. Patient advises that he maintains an adequate appetite. He is eating well. Weight today is 222 lb 8 oz (100.9 kg), which compared to his last visit to the clinic, represents a 2 pound increase.  Patient denies pain in the clinic today.   Past Medical History:  Diagnosis Date  . Arthritis    knee and back  . BPH (benign prostatic hypertrophy)   . DM type 2 (diabetes mellitus, type 2) ([place])   . Environmental allergies   . Headache    sinus headaches  . HLD (hyperlipidemia)     Past Surgical History:  Procedure Laterality Date  . CARDIAC CATHETERIZATION    . CARDIAC CATHETERIZATION     no stents placed  . KNEE ARTHROSCOPY Right 03/16/2015   Procedure: ARTHROSCOPY KNEE WITH PARTIAL MEDIAL MENISECTOMY, AND CHONDROPLASTY;  Surgeon: Leanor Kail,  MD;  Location: [facility];  Service: Orthopedics;  Laterality: Right;  Diabetic - oral meds    Family History  Problem Relation Age of Onset  . Renal Disease Father        ESRD  . Diabetes Father   . Breast cancer Sister     Social History:  reports that he has never smoked. He has never used smokeless tobacco. He reports that he does not drink alcohol or use drugs.  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  Allergies: No Known Allergies  Current Medications: Current Outpatient Medications  Medication Sig Dispense Refill  . acyclovir (ZOVIRAX) 200 MG capsule 400 mg once.    Marland Kitchen aspirin (ASPIRIN EC) 81 MG EC tablet Take 81 mg by mouth daily. Swallow whole.    Marland Kitchen azelastine (ASTELIN) 0.1 % nasal [address] [address] 1 [place] into both nostrils 2 (two) times daily. Use in each nostril as directed    . cetirizine (ZYRTEC) 10 MG tablet Take 10 mg by mouth daily.    . fluticasone (FLONASE) 50 MCG/ACT nasal [address] 2 sprays into both nostrils daily.    Marland Kitchen lisinopril (PRINIVIL,ZESTRIL) 10 MG tablet Take 10 mg by mouth daily.    . metFORMIN (GLUCOPHAGE) 500 MG tablet Take by mouth 2 (two) times daily with a meal.    . pioglitazone (ACTOS) 45 MG tablet Take 45 mg by mouth daily.    . pravastatin (PRAVACHOL) 40 MG  tablet Take 40 mg by mouth daily.    . Probiotic Product (PROBIOTIC ACIDOPHILUS) CAPS Take by mouth.    . sitaGLIPtin (JANUVIA) 50 MG tablet Take 50 mg by mouth daily.     No current facility-administered medications for this visit.     Review of Systems  Constitutional: Positive for weight loss (down 2 pounds). Negative for diaphoresis, fever and malaise/fatigue.       "I am doing well".  HENT: Negative.   Eyes: Negative for pain and redness.       Chronic dry eyes  Respiratory: Negative for cough, hemoptysis, sputum production and shortness of breath.   Cardiovascular: Negative for chest pain, palpitations, orthopnea, leg swelling and PND.   Gastrointestinal: Negative for abdominal pain, blood in stool, constipation, diarrhea, melena, nausea and vomiting.       IBS  Genitourinary: Negative for dysuria, frequency, hematuria and urgency.       Nocturia related to known BPH; on tamulosin.  Musculoskeletal: Negative for back pain, falls, joint pain and myalgias.  Skin: Negative for itching and rash.  Neurological: Negative for dizziness, tremors, weakness and headaches.  Endo/Heme/Allergies: Does not bruise/bleed easily.       Diabetes - A1c 7.6 on 01/22/2018.  Psychiatric/Behavioral: Negative for depression, memory loss and suicidal ideas. The patient is not nervous/anxious and does not have insomnia.   All other systems reviewed and are negative.  Performance status (ECOG): 0 - Asymptomatic  Vital Signs BP 113/70 (BP Location: Left Arm, Patient Position: Sitting)   Pulse 80   Temp 98.6 F (37 C) (Tympanic)   Resp 18   Wt 222 lb 8 oz (100.9 kg)   BMI 34.33 kg/m   Physical Exam  Constitutional: He is oriented to person, place, and time and well-developed, well-nourished, and in no distress.  HENT:  Head: Normocephalic and atraumatic.  Short grey hair.  Eyes: Pupils are equal, round, and reactive to light. EOM are normal. No scleral icterus.  Glasses. Blue eyes.   Neck: Normal range of motion. Neck supple. No tracheal deviation present. No thyromegaly present.  Cardiovascular: Normal rate, regular rhythm and normal heart sounds. Exam reveals no gallop and no friction rub.  No murmur heard. Pulmonary/Chest: Effort normal and breath sounds normal. No respiratory distress. He has no wheezes. He has no rales.  Abdominal: Soft. Bowel sounds are normal. He exhibits no distension. There is no tenderness.  Musculoskeletal: Normal range of motion. He exhibits no edema or tenderness.  Lymphadenopathy:    He has no cervical adenopathy.    He has no axillary adenopathy.       Right: No inguinal and no supraclavicular adenopathy  present.       Left: No inguinal and no supraclavicular adenopathy present.  Neurological: He is alert and oriented to person, place, and time.  Skin: Skin is warm and dry. No rash noted. No erythema.  Psychiatric: Mood, affect and judgment normal.  Nursing note and vitals reviewed.   Appointment on 01/25/2018  Component Date Value Ref Range Status  . Uric Acid, Serum 01/25/2018 5.6  3.7 - 8.6 mg/dL Final   Comment: Please note change in reference range. Performed at [facility], [address] [place], [place] [address]   . Sodium 01/25/2018 138  135 - 145 mmol/L Final  . Potassium 01/25/2018 4.7  3.5 - 5.1 mmol/L Final  . Chloride 01/25/2018 106  98 - 111 mmol/L Final   Please note change in reference range.  Marland Kitchen CO2  01/25/2018 24  22 - 32 mmol/L Final  . Glucose, Bld 01/25/2018 118* 70 - 99 mg/dL Final   Please note change in reference range.  . BUN 01/25/2018 11  8 - 23 mg/dL Final   Please note change in reference range.  . Creatinine, Ser 01/25/2018 0.86  0.61 - 1.24 mg/dL Final  . Calcium 01/25/2018 9.6  8.9 - 10.3 mg/dL Final  . GFR calc non Af Amer 01/25/2018 >60  >60 mL/min Final  . GFR calc Af Amer 01/25/2018 >60  >60 mL/min Final   Comment: (NOTE) The eGFR has been calculated using the CKD EPI equation. This calculation has not been validated in all clinical situations. eGFR's persistently <60 mL/min signify possible Chronic Kidney Disease.   Georgiann Hahn gap 01/25/2018 8  5 - 15 Final   Performed at [facility], [address] [place], [place] [address]  . WBC 01/25/2018 25.4* 3.8 - 10.6 K/uL Final  . RBC 01/25/2018 4.74  4.40 - 5.90 MIL/uL Final  . Hemoglobin 01/25/2018 14.6  13.0 - 18.0 g/dL Final  . HCT 01/25/2018 43.3  40.0 - 52.0 % Final  . MCV 01/25/2018 91.3  80.0 - 100.0 fL Final  . MCH 01/25/2018 30.8  26.0 - 34.0 pg Final  . MCHC 01/25/2018 33.7  32.0 - 36.0 g/dL Final  . RDW 01/25/2018 15.5* 11.5 - 14.5 % Final  . Platelets 01/25/2018  275  150 - 440 K/uL Final  . Neutrophils Relative % 01/25/2018 22  % Final  . Neutro Abs 01/25/2018 5.7  1.4 - 6.5 K/uL Final  . Lymphocytes Relative 01/25/2018 60  % Final  . Lymphs Abs 01/25/2018 15.4* 1.0 - 3.6 K/uL Final  . Monocytes Relative 01/25/2018 5  % Final  . Monocytes Absolute 01/25/2018 1.2* 0.2 - 1.0 K/uL Final  . Eosinophils Relative 01/25/2018 12  % Final  . Eosinophils Absolute 01/25/2018 3.0* 0 - 0.7 K/uL Final   RESULT REPEATED AND VERIFIED  . Basophils Relative 01/25/2018 1  % Final  . Basophils Absolute 01/25/2018 0.2* 0 - 0.1 K/uL Final   Performed at [facility], [address] [place], [place] [address]    Assessment:  TRACE WIRICK is a 77 y.o. male with stage 0 chronic lymphocytic leukemia.  He has had lymphocytosis since 04/11/2015.  WBC has ranged between 21,000 - 23,000.  Flow cytometry on 05/04/2015 confirmed CLL.  There was a population of monoclonal B-lymphocytes that were restricted to the dim expression of kappa light chain immunoglobulin.  The clonal B cells expressed CD5 and CD23.  Expression of CD20 was dim.  CD38 expression was in 55% of clonal B cells.  There were no increased blasts.  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  Symptomatically, patient is doing well.  There are no acute concerns.  He denies any new areas of palpable adenopathy.  No B symptoms or recent infections.  Exam is stable.  WBC 25,400 with an [place] of 5700.  Hemoglobin 14.6, hematocrit 43.3, MCV 91.3, and platelets 275,000.  Renal and hepatic function are normal.  Uric acid stable at 5.6.  Plan: 1. Labs today: CBC with differential, uric acid, BMP, uric acid 2. Discuss waxing and waning adenopathy in CLL. No current indication for treatment.  Discuss plan to return to every 6 month surveillance.  Patient to call if any concerns. Discussed indications for treatment; B symptoms, enlarging or disfiguring adenopathy.  3. RTC in 6 months  for MD assessment and  labs (CBC  with diff, CMP, uric acid, LDH).   Honor Loh, NP  01/25/2018 , 9:26 PM

## 2018-01-25 ENCOUNTER — Inpatient Hospital Stay: Payer: Medicare Other | Attending: Hematology and Oncology | Admitting: Urgent Care

## 2018-01-25 ENCOUNTER — Other Ambulatory Visit: Payer: Self-pay | Admitting: *Deleted

## 2018-01-25 ENCOUNTER — Inpatient Hospital Stay: Payer: Medicare Other

## 2018-01-25 VITALS — BP 113/70 | HR 80 | Temp 98.6°F | Resp 18 | Wt 222.5 lb

## 2018-01-25 DIAGNOSIS — N400 Enlarged prostate without lower urinary tract symptoms: Secondary | ICD-10-CM | POA: Diagnosis not present

## 2018-01-25 DIAGNOSIS — R599 Enlarged lymph nodes, unspecified: Secondary | ICD-10-CM

## 2018-01-25 DIAGNOSIS — R351 Nocturia: Secondary | ICD-10-CM | POA: Diagnosis not present

## 2018-01-25 DIAGNOSIS — R591 Generalized enlarged lymph nodes: Secondary | ICD-10-CM

## 2018-01-25 DIAGNOSIS — C9110 Chronic lymphocytic leukemia of B-cell type not having achieved remission: Secondary | ICD-10-CM | POA: Diagnosis present

## 2018-01-25 DIAGNOSIS — Z7984 Long term (current) use of oral hypoglycemic drugs: Secondary | ICD-10-CM | POA: Insufficient documentation

## 2018-01-25 DIAGNOSIS — E119 Type 2 diabetes mellitus without complications: Secondary | ICD-10-CM | POA: Diagnosis not present

## 2018-01-25 DIAGNOSIS — D72820 Lymphocytosis (symptomatic): Secondary | ICD-10-CM

## 2018-01-25 LAB — CBC WITH DIFFERENTIAL/PLATELET
Basophils Absolute: 0.2 10*3/uL — ABNORMAL HIGH (ref 0–0.1)
Basophils Relative: 1 %
Eosinophils Absolute: 3 10*3/uL — ABNORMAL HIGH (ref 0–0.7)
Eosinophils Relative: 12 %
HCT: 43.3 % (ref 40.0–52.0)
Hemoglobin: 14.6 g/dL (ref 13.0–18.0)
Lymphocytes Relative: 60 %
Lymphs Abs: 15.4 10*3/uL — ABNORMAL HIGH (ref 1.0–3.6)
MCH: 30.8 pg (ref 26.0–34.0)
MCHC: 33.7 g/dL (ref 32.0–36.0)
MCV: 91.3 fL (ref 80.0–100.0)
Monocytes Absolute: 1.2 10*3/uL — ABNORMAL HIGH (ref 0.2–1.0)
Monocytes Relative: 5 %
Neutro Abs: 5.7 10*3/uL (ref 1.4–6.5)
Neutrophils Relative %: 22 %
Platelets: 275 10*3/uL (ref 150–440)
RBC: 4.74 MIL/uL (ref 4.40–5.90)
RDW: 15.5 % — ABNORMAL HIGH (ref 11.5–14.5)
WBC: 25.4 10*3/uL — ABNORMAL HIGH (ref 3.8–10.6)

## 2018-01-25 LAB — BASIC METABOLIC PANEL
Anion gap: 8 (ref 5–15)
BUN: 11 mg/dL (ref 8–23)
CO2: 24 mmol/L (ref 22–32)
Calcium: 9.6 mg/dL (ref 8.9–10.3)
Chloride: 106 mmol/L (ref 98–111)
Creatinine, Ser: 0.86 mg/dL (ref 0.61–1.24)
GFR calc Af Amer: >60 mL/min (ref >60)
GFR calc non Af Amer: >60 mL/min (ref >60)
Glucose, Bld: 118 mg/dL — ABNORMAL HIGH (ref 70–99)
Potassium: 4.7 mmol/L (ref 3.5–5.1)
Sodium: 138 mmol/L (ref 135–145)

## 2018-01-25 LAB — URIC ACID: Uric Acid, Serum: 5.6 mg/dL (ref 3.7–8.6)

## 2018-01-25 NOTE — Progress Notes (Signed)
Patient recently given prescription for flomax.  Otherwise no changes or complaints.

## 2018-04-26 ENCOUNTER — Other Ambulatory Visit: Payer: Medicare Other

## 2018-04-26 ENCOUNTER — Ambulatory Visit: Payer: Medicare Other | Admitting: Hematology and Oncology

## 2018-07-24 NOTE — Progress Notes (Signed)
[facility] day:  07/26/2018    Chief Complaint: Philip Robbins is a 78 y.o. male with stage 0 chronic lymphocytic leukemia who is seen for 6 month assessment.  HPI: The patient was last seen in the medical oncology clinic on 01/25/2018.  At that time, he was doing well.  There were no acute concerns.  He denied any new areas of palpable adenopathy.  He had no B symptoms or recent infections.  Exam was stable.  WBC was 25,400 with an [place] of 5700.  Hemoglobin was 14.6, hematocrit 43.3, MCV 91.3, and platelets 275,000.  Renal and hepatic function were normal.  Uric acid stable was 5.6.  During the interim, he has done well.  He denies any B symptoms.  He denies any interval infections.  He denies any bruising or bleeding.   Past Medical History:  Diagnosis Date  . Arthritis    knee and back  . BPH (benign prostatic hypertrophy)   . DM type 2 (diabetes mellitus, type 2) ([place])   . Environmental allergies   . Headache    sinus headaches  . HLD (hyperlipidemia)     Past Surgical History:  Procedure Laterality Date  . CARDIAC CATHETERIZATION    . CARDIAC CATHETERIZATION     no stents placed  . KNEE ARTHROSCOPY Right 03/16/2015   Procedure: ARTHROSCOPY KNEE WITH PARTIAL MEDIAL MENISECTOMY, AND CHONDROPLASTY;  Surgeon: Leanor Kail, MD;  Location: [facility];  Service: Orthopedics;  Laterality: Right;  Diabetic - oral meds    Family History  Problem Relation Age of Onset  . Renal Disease Father        ESRD  . Diabetes Father   . Breast cancer Sister     Social History:  reports that he has never smoked. He has never used smokeless tobacco. He reports that he does not drink alcohol or use drugs.  He is a full time Environmental education officer.  He lives in [place].  The patient is accompanied by his wife, Philip Robbins.  Allergies: No Known Allergies  Current Medications: Current Outpatient Medications  Medication Sig Dispense Refill  . acyclovir  (ZOVIRAX) 200 MG capsule 400 mg once.    Marland Kitchen aspirin (ASPIRIN EC) 81 MG EC tablet Take 81 mg by mouth daily. Swallow whole.    . cetirizine (ZYRTEC) 10 MG tablet Take 10 mg by mouth daily.    . fluticasone (FLONASE) 50 MCG/ACT nasal [place] [address] 2 sprays into both nostrils daily.    Marland Kitchen lisinopril (PRINIVIL,ZESTRIL) 10 MG tablet Take 10 mg by mouth daily.    . metFORMIN (GLUCOPHAGE) 500 MG tablet Take by mouth 2 (two) times daily with a meal.    . pioglitazone (ACTOS) 45 MG tablet Take 45 mg by mouth daily.    . pravastatin (PRAVACHOL) 40 MG tablet Take 40 mg by mouth daily.    . Probiotic Product (PROBIOTIC ACIDOPHILUS) CAPS Take by mouth.    . sitaGLIPtin (JANUVIA) 50 MG tablet Take 50 mg by mouth daily.    Marland Kitchen azelastine (ASTELIN) 0.1 % nasal [place] [address] 1 [place] into both nostrils 2 (two) times daily. Use in each nostril as directed    . tamsulosin (FLOMAX) 0.4 MG CAPS capsule Take 0.4 mg by mouth daily.     No current facility-administered medications for this visit.     Review of Systems  Constitutional: Positive for weight loss (1 pound). Negative for chills, diaphoresis, fever and malaise/fatigue.  No concerns.  HENT: Negative.  Negative for congestion, ear discharge, ear pain, nosebleeds, sinus pain and sore throat.   Eyes: Negative for double vision, photophobia, pain and redness.       Chronic dry eyes.  Respiratory: Negative.  Negative for cough, hemoptysis, sputum production and shortness of breath.   Cardiovascular: Negative.  Negative for chest pain, palpitations, orthopnea, leg swelling and PND.  Gastrointestinal: Negative for abdominal pain, blood in stool, constipation, diarrhea, heartburn, melena and nausea.       Irritable bowel syndrome.  Genitourinary: Negative for dysuria, frequency, hematuria and urgency.       Nocturia related to known BPH on tamulosin.  Musculoskeletal: Negative.  Negative for back pain, falls, joint pain, myalgias and neck pain.  Skin: Negative.   Negative for itching and rash.  Neurological: Negative.  Negative for dizziness, tremors, sensory change, speech change, focal weakness, weakness and headaches.  Endo/Heme/Allergies: Does not bruise/bleed easily.       Diabetes.  Blood sugar has been "fair".  Psychiatric/Behavioral: Negative.  Negative for depression and memory loss. The patient is not nervous/anxious and does not have insomnia.   All other systems reviewed and are negative.  Performance status (ECOG): 0  Vital Signs BP 126/68 (BP Location: Left Arm, Patient Position: Sitting)   Pulse 75   Temp 98.3 F (36.8 C) (Tympanic)   Resp 16   Wt 221 lb 7.2 oz (100.4 kg)   BMI 34.17 kg/m   Physical Exam  Constitutional: He is oriented to person, place, and time and well-developed, well-nourished, and in no distress. No distress.  HENT:  Head: Normocephalic and atraumatic.  Mouth/Throat: Oropharynx is clear and moist. No oropharyngeal exudate.  Short gray/white hair.  Eyes: Pupils are equal, round, and reactive to light. Conjunctivae and EOM are normal. No scleral icterus.  Glasses. Blue eyes.   Neck: Normal range of motion. Neck supple. No JVD present.  Cardiovascular: Normal rate, regular rhythm and normal heart sounds. Exam reveals no gallop and no friction rub.  No murmur heard. Pulmonary/Chest: Effort normal and breath sounds normal. No respiratory distress. He has no wheezes. He has no rales.  Abdominal: Soft. Bowel sounds are normal. He exhibits no distension. There is no abdominal tenderness. There is no rebound and no guarding.  Musculoskeletal: Normal range of motion.        General: No tenderness or edema.  Lymphadenopathy:    He has no cervical adenopathy.    He has no axillary adenopathy.       Right: No inguinal and no supraclavicular adenopathy present.       Left: No inguinal and no supraclavicular adenopathy present.  Neurological: He is alert and oriented to person, place, and time.  Skin: Skin is warm.  No rash noted. He is not diaphoretic. No erythema. No pallor.  Psychiatric: Mood, affect and judgment normal.  Nursing note and vitals reviewed.   Appointment on 07/26/2018  Component Date Value Ref Range Status  . Uric Acid, Serum 07/26/2018 6.3  3.7 - 8.6 mg/dL Final   Performed at [facility], [address], [place], [place] [address]  . LDH 07/26/2018 144  98 - 192 U/L Final   Performed at [facility], [address], [place],  [address]  . Sodium 07/26/2018 136  135 - 145 mmol/L Final  . Potassium 07/26/2018 4.6  3.5 - 5.1 mmol/L Final  . Chloride 07/26/2018 104  98 - 111 mmol/L Final  . CO2  07/26/2018 23  22 - 32 mmol/L Final  . Glucose, Bld 07/26/2018 141* 70 - 99 mg/dL Final  . BUN 07/26/2018 14  8 - 23 mg/dL Final  . Creatinine, Ser 07/26/2018 0.90  0.61 - 1.24 mg/dL Final  . Calcium 07/26/2018 9.4  8.9 - 10.3 mg/dL Final  . Total Protein 07/26/2018 7.8  6.5 - 8.1 g/dL Final  . Albumin 07/26/2018 4.5  3.5 - 5.0 g/dL Final  . AST 07/26/2018 22  15 - 41 U/L Final  . ALT 07/26/2018 20  0 - 44 U/L Final  . Alkaline Phosphatase 07/26/2018 47  38 - 126 U/L Final  . Total Bilirubin 07/26/2018 0.6  0.3 - 1.2 mg/dL Final  . GFR calc non Af Amer 07/26/2018 >60  >60 mL/min Final  . GFR calc Af Amer 07/26/2018 >60  >60 mL/min Final  . Anion gap 07/26/2018 9  5 - 15 Final   Performed at [facility], [address] [place], [place] [address]  . WBC 07/26/2018 28.2* 4.0 - 10.5 K/uL Final  . RBC 07/26/2018 4.82  4.22 - 5.81 MIL/uL Final  . Hemoglobin 07/26/2018 15.2  13.0 - 17.0 g/dL Final  . HCT 07/26/2018 45.6  39.0 - 52.0 % Final  . MCV 07/26/2018 94.6  80.0 - 100.0 fL Final  . MCH 07/26/2018 31.5  26.0 - 34.0 pg Final  . MCHC 07/26/2018 33.3  30.0 - 36.0 g/dL Final  . RDW 07/26/2018 14.9  11.5 - 15.5 % Final  . Platelets 07/26/2018 289  150 - 400 K/uL Final  . nRBC 07/26/2018 0.0  0.0 - 0.2 % Final  . Neutrophils Relative %  07/26/2018 24  % Final  . Neutro Abs 07/26/2018 6.6  1.7 - 7.7 K/uL Final  . Lymphocytes Relative 07/26/2018 66  % Final  . Lymphs Abs 07/26/2018 18.8* 0.7 - 4.0 K/uL Final  . Monocytes Relative 07/26/2018 7  % Final  . Monocytes Absolute 07/26/2018 2.0* 0.1 - 1.0 K/uL Final  . Eosinophils Relative 07/26/2018 2  % Final  . Eosinophils Absolute 07/26/2018 0.5  0.0 - 0.5 K/uL Final  . Basophils Relative 07/26/2018 1  % Final  . Basophils Absolute 07/26/2018 0.2* 0.0 - 0.1 K/uL Final  . Immature Granulocytes 07/26/2018 0  % Final  . Abs Immature Granulocytes 07/26/2018 0.10* 0.00 - 0.07 K/uL Final   Performed at [facility], [address] [place], [place] [address]    Assessment:  DISHON KEHOE is a 78 y.o. male with stage 0 chronic lymphocytic leukemia.  He has had lymphocytosis since 04/11/2015.  WBC has ranged between 21,000 - 23,000.  Flow cytometry on 05/04/2015 confirmed CLL.  There was a population of monoclonal B-lymphocytes that were restricted to the dim expression of kappa light chain immunoglobulin.  The clonal B cells expressed CD5 and CD23.  Expression of CD20 was dim.  CD38 expression was in 55% of clonal B cells.  There were no increased blasts.  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  Symptomatically, he denies any B symptoms.  Exam is stable.  WBC 28,200 with an [place] of 6600.  Hemoglobin 15.2, hematocrit 45.6, MCV 94.6, and platelets 289,000.  Creatinine is 0.9.  Uric acid is 6.3.  Plan: 1.   Labs today: CBC with diff, CMP, LDH, uric acid. 2.   Chronic lymphocytic leukemia   Clinically doing well.   Exam reveals no adenopathy or hepatosplenomegaly.   Continue surveillance. 3.  RTC in 6 months per MD assessment and labs (CBC with diff, CMP, LDH, uric acid).   Lequita Asal, MD  07/26/2018, 11:55 AM

## 2018-07-26 ENCOUNTER — Inpatient Hospital Stay: Payer: Medicare Other | Attending: Hematology and Oncology

## 2018-07-26 ENCOUNTER — Ambulatory Visit: Payer: Medicare Other | Admitting: Hematology and Oncology

## 2018-07-26 ENCOUNTER — Encounter: Payer: Self-pay | Admitting: Hematology and Oncology

## 2018-07-26 ENCOUNTER — Other Ambulatory Visit: Payer: Medicare Other

## 2018-07-26 ENCOUNTER — Inpatient Hospital Stay (HOSPITAL_BASED_OUTPATIENT_CLINIC_OR_DEPARTMENT_OTHER): Payer: Medicare Other | Admitting: Hematology and Oncology

## 2018-07-26 VITALS — BP 126/68 | HR 75 | Temp 98.3°F | Resp 16 | Wt 221.5 lb

## 2018-07-26 DIAGNOSIS — C9110 Chronic lymphocytic leukemia of B-cell type not having achieved remission: Secondary | ICD-10-CM | POA: Insufficient documentation

## 2018-07-26 DIAGNOSIS — E119 Type 2 diabetes mellitus without complications: Secondary | ICD-10-CM | POA: Insufficient documentation

## 2018-07-26 LAB — CBC WITH DIFFERENTIAL/PLATELET
Abs Immature Granulocytes: 0.1 10*3/uL — ABNORMAL HIGH (ref 0.00–0.07)
BASOS ABS: 0.2 10*3/uL — AB (ref 0.0–0.1)
Basophils Relative: 1 %
EOS ABS: 0.5 10*3/uL (ref 0.0–0.5)
Eosinophils Relative: 2 %
HCT: 45.6 % (ref 39.0–52.0)
Hemoglobin: 15.2 g/dL (ref 13.0–17.0)
IMMATURE GRANULOCYTES: 0 %
LYMPHS ABS: 18.8 10*3/uL — AB (ref 0.7–4.0)
Lymphocytes Relative: 66 %
MCH: 31.5 pg (ref 26.0–34.0)
MCHC: 33.3 g/dL (ref 30.0–36.0)
MCV: 94.6 fL (ref 80.0–100.0)
Monocytes Absolute: 2 10*3/uL — ABNORMAL HIGH (ref 0.1–1.0)
Monocytes Relative: 7 %
NEUTROS PCT: 24 %
NRBC: 0 % (ref 0.0–0.2)
Neutro Abs: 6.6 10*3/uL (ref 1.7–7.7)
Platelets: 289 10*3/uL (ref 150–400)
RBC: 4.82 MIL/uL (ref 4.22–5.81)
RDW: 14.9 % (ref 11.5–15.5)
WBC: 28.2 10*3/uL — ABNORMAL HIGH (ref 4.0–10.5)

## 2018-07-26 LAB — COMPREHENSIVE METABOLIC PANEL
ALK PHOS: 47 U/L (ref 38–126)
ALT: 20 U/L (ref 0–44)
ANION GAP: 9 (ref 5–15)
AST: 22 U/L (ref 15–41)
Albumin: 4.5 g/dL (ref 3.5–5.0)
BUN: 14 mg/dL (ref 8–23)
CALCIUM: 9.4 mg/dL (ref 8.9–10.3)
CO2: 23 mmol/L (ref 22–32)
Chloride: 104 mmol/L (ref 98–111)
Creatinine, Ser: 0.9 mg/dL (ref 0.61–1.24)
GFR calc non Af Amer: >60 mL/min (ref >60)
Glucose, Bld: 141 mg/dL — ABNORMAL HIGH (ref 70–99)
Potassium: 4.6 mmol/L (ref 3.5–5.1)
Sodium: 136 mmol/L (ref 135–145)
TOTAL PROTEIN: 7.8 g/dL (ref 6.5–8.1)
Total Bilirubin: 0.6 mg/dL (ref 0.3–1.2)

## 2018-07-26 LAB — LACTATE DEHYDROGENASE: LDH: 144 U/L (ref 98–192)

## 2018-07-26 LAB — URIC ACID: URIC ACID, SERUM: 6.3 mg/dL (ref 3.7–8.6)

## 2018-07-26 NOTE — Progress Notes (Signed)
Pt here for f/u. Denies any complaints at this time.  

## 2019-01-21 NOTE — Progress Notes (Signed)
[facility]  [address], [address] [place], [place] [address] Phone: [phone]  Fax: [phone]   Clinic Day:  01/24/2019  Referring physician: Sofie Hartigan, MD  Chief Complaint: Philip Robbins is a 78 y.o. male with stage 0 chronic lymphocytic leukemia who is seen for 6 month assessment.  HPI: The patient was last seen in the hematology clinic on 07/26/2018. At that time, he denied any B symptoms. Exam was stable. WBC 28,200 with an [place] of 6600. Hemoglobin 15.2, hematocrit 45.6, MCV 94.6, and platelets 289,000. Creatinine is 0.9. Uric acid is 6.3.  During the interim, he is doing "good." He denies any infections, sweats, or weight loss. He denies any lumps or bumps, or abnormal bruising or bleeding. He has no complaints.    Past Medical History:  Diagnosis Date  . Arthritis    knee and back  . BPH (benign prostatic hypertrophy)   . DM type 2 (diabetes mellitus, type 2) ([place])   . Environmental allergies   . Headache    sinus headaches  . HLD (hyperlipidemia)     Past Surgical History:  Procedure Laterality Date  . CARDIAC CATHETERIZATION    . CARDIAC CATHETERIZATION     no stents placed  . KNEE ARTHROSCOPY Right 03/16/2015   Procedure: ARTHROSCOPY KNEE WITH PARTIAL MEDIAL MENISECTOMY, AND CHONDROPLASTY;  Surgeon: Leanor Kail, MD;  Location: [facility];  Service: Orthopedics;  Laterality: Right;  Diabetic - oral meds    Family History  Problem Relation Age of Onset  . Renal Disease Father        ESRD  . Diabetes Father   . Breast cancer Sister     Social History:  reports that he has never smoked. He has never used smokeless tobacco. He reports that he does not drink alcohol or use drugs. 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   Allergies: No Known Allergies  Current Medications: Current Outpatient Medications  Medication Sig Dispense Refill  . acyclovir  (ZOVIRAX) 200 MG capsule Take 400 mg by mouth daily.     Marland Kitchen aspirin (ASPIRIN EC) 81 MG EC tablet Take 81 mg by mouth daily. Swallow whole.    . cetirizine (ZYRTEC) 10 MG tablet Take 10 mg by mouth daily as needed.     Marland Kitchen lisinopril (PRINIVIL,ZESTRIL) 10 MG tablet Take 10 mg by mouth daily.    . metFORMIN (GLUCOPHAGE) 500 MG tablet Take by mouth 2 (two) times daily with a meal.    . pioglitazone (ACTOS) 45 MG tablet Take 45 mg by mouth daily.    . pravastatin (PRAVACHOL) 40 MG tablet Take 40 mg by mouth daily.    . Probiotic Product (PROBIOTIC ACIDOPHILUS) CAPS Take 1 capsule by mouth as needed.     . sitaGLIPtin (JANUVIA) 50 MG tablet Take 50 mg by mouth daily.    Marland Kitchen azelastine (ASTELIN) 0.1 % nasal [place] 1 [place] into both nostrils 2 (two) times daily. Use in each nostril as directed     No current facility-administered medications for this visit.     Review of Systems  Constitutional: Positive for weight loss (1 pound). Negative for chills, diaphoresis, fever and malaise/fatigue.       No concerns.  HENT: Negative.  Negative for congestion, ear discharge, ear pain, nosebleeds, sinus pain and sore throat.   Eyes: Negative for double vision, photophobia, pain and redness.  Chronic dry eyes.  Respiratory: Negative.  Negative for cough, hemoptysis, sputum production and shortness of breath.   Cardiovascular: Negative.  Negative for chest pain, palpitations, orthopnea, leg swelling and PND.  Gastrointestinal: Negative for abdominal pain, blood in stool, constipation, diarrhea, heartburn, melena and nausea.       Irritable bowel syndrome.  Genitourinary: Negative for dysuria, frequency, hematuria and urgency.       Nocturia related to known BPH on tamulosin.  Musculoskeletal: Negative.  Negative for back pain, falls, joint pain, myalgias and neck pain.  Skin: Negative.  Negative for itching and rash.  Neurological: Negative.  Negative for dizziness, tremors, sensory change, speech  change, focal weakness, weakness and headaches.  Endo/Heme/Allergies: Does not bruise/bleed easily.       Diabetes.  Blood sugar has been "fair".  Psychiatric/Behavioral: Negative.  Negative for depression and memory loss. The patient is not nervous/anxious and does not have insomnia.   All other systems reviewed and are negative.  Performance status (ECOG): 0  Vitals Blood pressure 128/77, pulse 79, temperature 97.6 F (36.4 C), temperature source Tympanic, resp. rate 16, weight 220 lb 9.1 oz (100 kg), SpO2 98 %.   Physical Exam  Constitutional: He is oriented to person, place, and time. He appears well-developed and well-nourished. No distress.  HENT:  Head: Normocephalic and atraumatic.  Mouth/Throat: Oropharynx is clear and moist. No oropharyngeal exudate.  Short gray/white hair. Mask.  Eyes: Pupils are equal, round, and reactive to light. Conjunctivae and EOM are normal. No scleral icterus.  Glasses. Blue eyes.    Neck: Normal range of motion. Neck supple.  Cardiovascular: Normal rate, regular rhythm and normal heart sounds.  No murmur heard. Pulmonary/Chest: Effort normal and breath sounds normal. No respiratory distress. He has no wheezes.  Abdominal: Soft. Bowel sounds are normal. He exhibits no distension. There is no splenomegaly or hepatomegaly. There is no abdominal tenderness.  Musculoskeletal: Normal range of motion.        General: No edema.  Lymphadenopathy:    He has no cervical adenopathy.    He has no axillary adenopathy.       Right: No inguinal and no supraclavicular adenopathy present.       Left: No inguinal and no supraclavicular adenopathy present.  Neurological: He is alert and oriented to person, place, and time.  Skin: Skin is warm and dry. He is not diaphoretic. No erythema.  Psychiatric: He has a normal mood and affect. His behavior is normal. Judgment and thought content normal.  Nursing note and vitals reviewed.   Appointment on 01/24/2019   Component Date Value Ref Range Status  . Uric Acid, Serum 01/24/2019 5.2  3.7 - 8.6 mg/dL Final   Performed at [facility], [address] [place], [place] [address]  . LDH 01/24/2019 138  98 - 192 U/L Final   Performed at [facility], [address], [place], [place] [address]  . Sodium 01/24/2019 135  135 - 145 mmol/L Final  . Potassium 01/24/2019 4.6  3.5 - 5.1 mmol/L Final  . Chloride 01/24/2019 104  98 - 111 mmol/L Final  . CO2 01/24/2019 23  22 - 32 mmol/L Final  . Glucose, Bld 01/24/2019 151* 70 - 99 mg/dL Final  . BUN 01/24/2019 8  8 - 23 mg/dL Final  . Creatinine, Ser 01/24/2019 0.87  0.61 - 1.24 mg/dL Final  . Calcium 01/24/2019 9.5  8.9 - 10.3 mg/dL Final  . Total Protein 01/24/2019 7.3  6.5 - 8.1 g/dL Final  . Albumin 01/24/2019 4.2  3.5 - 5.0 g/dL Final  . AST 01/24/2019 21  15 - 41 U/L Final  . ALT 01/24/2019 16  0 - 44 U/L Final  . Alkaline Phosphatase 01/24/2019 51  38 - 126 U/L Final  . Total Bilirubin 01/24/2019 0.7  0.3 - 1.2 mg/dL Final  . GFR calc non Af Amer 01/24/2019 >60  >60 mL/min Final  . GFR calc Af Amer 01/24/2019 >60  >60 mL/min Final  . Anion gap 01/24/2019 8  5 - 15 Final   Performed at [facility], [address] [place], [place] [address]  . WBC 01/24/2019 32.5* 4.0 - 10.5 K/uL Final  . RBC 01/24/2019 4.78  4.22 - 5.81 MIL/uL Final  . Hemoglobin 01/24/2019 14.9  13.0 - 17.0 g/dL Final  . HCT 01/24/2019 45.6  39.0 - 52.0 % Final  . MCV 01/24/2019 95.4  80.0 - 100.0 fL Final  . MCH 01/24/2019 31.2  26.0 - 34.0 pg Final  . MCHC 01/24/2019 32.7  30.0 - 36.0 g/dL Final  . RDW 01/24/2019 15.0  11.5 - 15.5 % Final  . Platelets 01/24/2019 275  150 - 400 K/uL Final  . nRBC 01/24/2019 0.0  0.0 - 0.2 % Final   Performed at [facility], [address], [place], [place] [address]  . Neutrophils Relative % 01/24/2019 PENDING  % Incomplete  . Neutro Abs 01/24/2019 PENDING  1.7 - 7.7 K/uL Incomplete  .  Band Neutrophils 01/24/2019 PENDING  % Incomplete  . Lymphocytes Relative 01/24/2019 PENDING  % Incomplete  . Lymphs Abs 01/24/2019 PENDING  0.7 - 4.0 K/uL Incomplete  . Monocytes Relative 01/24/2019 PENDING  % Incomplete  . Monocytes Absolute 01/24/2019 PENDING  0.1 - 1.0 K/uL Incomplete  . Eosinophils Relative 01/24/2019 PENDING  % Incomplete  . Eosinophils Absolute 01/24/2019 PENDING  0.0 - 0.5 K/uL Incomplete  . Basophils Relative 01/24/2019 PENDING  % Incomplete  . Basophils Absolute 01/24/2019 PENDING  0.0 - 0.1 K/uL Incomplete  . WBC Morphology 01/24/2019 PENDING   Incomplete  . RBC Morphology 01/24/2019 PENDING   Incomplete  . Smear Review 01/24/2019 PENDING   Incomplete  . Other 01/24/2019 PENDING  % Incomplete  . nRBC 01/24/2019 PENDING  0 /100 WBC Incomplete  . Metamyelocytes Relative 01/24/2019 PENDING  % Incomplete  . Myelocytes 01/24/2019 PENDING  % Incomplete  . Promyelocytes Relative 01/24/2019 PENDING  % Incomplete  . Blasts 01/24/2019 PENDING  % Incomplete    Assessment:  Philip Robbins is a 78 y.o. male with stage 0 chronic lymphocytic leukemia.  He has had lymphocytosis since 04/11/2015.  WBC has ranged between 21,000 - 23,000.  Flow cytometry on 05/04/2015 confirmed CLL.  There was a population of monoclonal B-lymphocytes that were restricted to the dim expression of kappa light chain immunoglobulin.  The clonal B cells expressed CD5 and CD23.  Expression of CD20 was dim.  CD38 expression was in 55% of clonal B cells.  There were no increased blasts.  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  Symptomatically, he is doing well.  He denies any fevers, sweats or weight loss.  Exam reveals no adenopathy or hepatosplenomegaly.  Plan: 1.   Labs today: CBC with diff, CMP, LDH, uric acid. 2.   Chronic lymphocytic leukemia             Hemoglobin 14.9.  Platelets 275,000.  WBC 32,500 (ANC 6100; ALC 24,100).  Review  very slow increase in WBC in past 4 years.   Clinically, he continues to do well.             Continue every 6 month surveillance 3.   RTC in 6 months for MD assessment and labs (CBC with diff, CMP, LDH, uric acid).    I discussed the assessment and treatment plan with the patient.  The patient was provided an opportunity to ask questions and all were answered.  The patient agreed with the plan and demonstrated an understanding of the instructions.  The patient was advised to call back if the symptoms worsen or if the condition fails to improve as anticipated.   Lequita Asal, MD, PhD    01/24/2019, 9:38 AM  I, Molly Dorshimer, am acting as Education administrator for [employer]. Mike Gip, MD, PhD.  I,  C. Mike Gip, MD, have reviewed the above documentation for accuracy and completeness, and I agree with the above.

## 2019-01-24 ENCOUNTER — Inpatient Hospital Stay: Payer: Medicare Other | Attending: Hematology and Oncology | Admitting: Hematology and Oncology

## 2019-01-24 ENCOUNTER — Inpatient Hospital Stay: Payer: Medicare Other

## 2019-01-24 ENCOUNTER — Encounter: Payer: Self-pay | Admitting: Hematology and Oncology

## 2019-01-24 ENCOUNTER — Other Ambulatory Visit: Payer: Self-pay

## 2019-01-24 VITALS — BP 128/77 | HR 79 | Temp 97.6°F | Resp 16 | Wt 220.6 lb

## 2019-01-24 DIAGNOSIS — C9110 Chronic lymphocytic leukemia of B-cell type not having achieved remission: Secondary | ICD-10-CM

## 2019-01-24 DIAGNOSIS — Z7984 Long term (current) use of oral hypoglycemic drugs: Secondary | ICD-10-CM | POA: Diagnosis not present

## 2019-01-24 DIAGNOSIS — E119 Type 2 diabetes mellitus without complications: Secondary | ICD-10-CM | POA: Diagnosis not present

## 2019-01-24 LAB — CBC WITH DIFFERENTIAL/PLATELET
Abs Immature Granulocytes: 0.1 10*3/uL — ABNORMAL HIGH (ref 0.00–0.07)
Basophils Absolute: 0.2 10*3/uL — ABNORMAL HIGH (ref 0.0–0.1)
Basophils Relative: 1 %
Eosinophils Absolute: 1 10*3/uL — ABNORMAL HIGH (ref 0.0–0.5)
Eosinophils Relative: 3 %
HCT: 45.6 % (ref 39.0–52.0)
Hemoglobin: 14.9 g/dL (ref 13.0–17.0)
Immature Granulocytes: 0 %
Lymphocytes Relative: 73 %
Lymphs Abs: 24.1 10*3/uL — ABNORMAL HIGH (ref 0.7–4.0)
MCH: 31.2 pg (ref 26.0–34.0)
MCHC: 32.7 g/dL (ref 30.0–36.0)
MCV: 95.4 fL (ref 80.0–100.0)
Monocytes Absolute: 1.1 10*3/uL — ABNORMAL HIGH (ref 0.1–1.0)
Monocytes Relative: 4 %
Neutro Abs: 6.1 10*3/uL (ref 1.7–7.7)
Neutrophils Relative %: 19 %
Platelets: 275 10*3/uL (ref 150–400)
RBC: 4.78 MIL/uL (ref 4.22–5.81)
RDW: 15 % (ref 11.5–15.5)
WBC: 32.5 10*3/uL — ABNORMAL HIGH (ref 4.0–10.5)
nRBC: 0 % (ref 0.0–0.2)

## 2019-01-24 LAB — COMPREHENSIVE METABOLIC PANEL
ALT: 16 U/L (ref 0–44)
AST: 21 U/L (ref 15–41)
Albumin: 4.2 g/dL (ref 3.5–5.0)
Alkaline Phosphatase: 51 U/L (ref 38–126)
Anion gap: 8 (ref 5–15)
BUN: 8 mg/dL (ref 8–23)
CO2: 23 mmol/L (ref 22–32)
Calcium: 9.5 mg/dL (ref 8.9–10.3)
Chloride: 104 mmol/L (ref 98–111)
Creatinine, Ser: 0.87 mg/dL (ref 0.61–1.24)
GFR calc Af Amer: >60 mL/min (ref >60)
GFR calc non Af Amer: >60 mL/min (ref >60)
Glucose, Bld: 151 mg/dL — ABNORMAL HIGH (ref 70–99)
Potassium: 4.6 mmol/L (ref 3.5–5.1)
Sodium: 135 mmol/L (ref 135–145)
Total Bilirubin: 0.7 mg/dL (ref 0.3–1.2)
Total Protein: 7.3 g/dL (ref 6.5–8.1)

## 2019-01-24 LAB — URIC ACID: Uric Acid, Serum: 5.2 mg/dL (ref 3.7–8.6)

## 2019-01-24 LAB — LACTATE DEHYDROGENASE: LDH: 138 U/L (ref 98–192)

## 2019-01-24 NOTE — Progress Notes (Signed)
Pt here for follow up. Denies any concerns.  

## 2019-07-21 NOTE — Progress Notes (Signed)
[facility]  [address], [address] [place], [place] [address] Phone: [phone]  Fax: [phone]   Telemedicine Office Visit:  07/26/2019  Referring physician: Sofie Hartigan, MD  I connected with Philip Robbins on 07/26/2019 at 9:49 AM by videoconferencing and verified that I was speaking with the correct person using 2 identifiers.  The patient was at home.  I discussed the limitations, risk, security and privacy concerns of performing an evaluation and management service by videoconferencing and the availability of in person appointments.  I also discussed with the patient that there may be a patient responsible charge related to this service.  The patient expressed understanding and agreed to proceed.   Chief Complaint: Philip Robbins is a 79 y.o. male with stage 0 chronic lymphocytic leukemia who is seen for 6 month assessment.  HPI: The patient was last seen in the hematology clinic on 01/24/2019. At that time, he was doing well. He denied any fevers, sweats or weight loss. Exam revealed no adenopathy or hepatosplenomegaly. Hematocrit 45.6, hemoglobin 14.9, platelets 275,000, WBC 32,500 (ANC 6,100, ALC 24,100; monocyte count 1,100, basophil count 1,000). CMP was normal. LDH 138. Uric acid 5.2.   Labs on 07/25/2019: Hematocrit 43.1, hemoglobin 13.7, platelets 353,000, WBC 30,300 (ANC 8,100; ALC 20,400). Uric acid 5.7.   During the interim, he has felt good. He notes off and on dry eyes. His blood sugar is "fair". He has no concerns.  He denies any adenopathy, bruising or bleeding.  He denies any interval infections.   Past Medical History:  Diagnosis Date  . Arthritis    knee and back  . BPH (benign prostatic hypertrophy)   . DM type 2 (diabetes mellitus, type 2) ([place])   . Environmental allergies   . Headache    sinus headaches  . HLD (hyperlipidemia)     Past Surgical History:  Procedure Laterality Date  . CARDIAC CATHETERIZATION    .  CARDIAC CATHETERIZATION     no stents placed  . KNEE ARTHROSCOPY Right 03/16/2015   Procedure: ARTHROSCOPY KNEE WITH PARTIAL MEDIAL MENISECTOMY, AND CHONDROPLASTY;  Surgeon: Leanor Kail, MD;  Location: [place];  Service: Orthopedics;  Laterality: Right;  Diabetic - oral meds    Family History  Problem Relation Age of Onset  . Renal Disease Father        ESRD  . Diabetes Father   . Breast cancer Sister     Social History:  reports that he has never smoked. He has never used smokeless tobacco. He reports that he does not drink alcohol or use drugs. He is a full time Environmental education officer. He lives in [place] with is wife Wende Crease.  He states that he has not been able to do much secondary to the COVID-19 pandemic.  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  Participants in the patient's visit and their role in the encounter included the patient and Waymon Budge, RN, today.  The intake visit was provided by Waymon Budge, RN.   Allergies: No Known Allergies  Current Medications: Current Outpatient Medications  Medication Sig Dispense Refill  . acyclovir (ZOVIRAX) 200 MG capsule Take 400 mg by mouth daily.     Marland Kitchen aspirin (ASPIRIN EC) 81 MG EC tablet Take 81 mg by mouth daily. Swallow whole.    . lisinopril (PRINIVIL,ZESTRIL) 10 MG tablet Take 10 mg by mouth daily.    . metFORMIN (GLUCOPHAGE) 500 MG tablet Take by mouth 2 (two) times daily with a meal.    .  pioglitazone (ACTOS) 45 MG tablet Take 45 mg by mouth daily.    . pravastatin (PRAVACHOL) 40 MG tablet Take 40 mg by mouth daily.    . sitaGLIPtin (JANUVIA) 50 MG tablet Take 50 mg by mouth daily.    Marland Kitchen azelastine (ASTELIN) 0.1 % nasal [address] [place] into both nostrils 2 (two) times daily. Use in each nostril as directed    . cetirizine (ZYRTEC) 10 MG tablet Take 10 mg by mouth daily as needed.     . Probiotic Product (PROBIOTIC ACIDOPHILUS) CAPS Take 1 capsule by mouth as needed.      No current facility-administered medications for  this visit.    Review of Systems  Constitutional: Negative for chills, diaphoresis, fever, malaise/fatigue and weight loss.       Feels "good".  HENT: Negative.  Negative for congestion, ear discharge, ear pain, nosebleeds, sinus pain and sore throat.   Eyes: Negative for double vision, photophobia, pain and redness.       Chronic dry eyes.  Respiratory: Negative.  Negative for cough, hemoptysis, sputum production and shortness of breath.   Cardiovascular: Negative.  Negative for chest pain, palpitations, orthopnea, leg swelling and PND.  Gastrointestinal: Negative for abdominal pain, blood in stool, constipation, diarrhea, heartburn, melena and nausea.       Irritable bowel syndrome.  Genitourinary: Negative for dysuria, frequency, hematuria and urgency.       Nocturia related to known BPH on tamulosin.  Musculoskeletal: Negative.  Negative for back pain, falls, joint pain, myalgias and neck pain.  Skin: Negative.  Negative for itching and rash.  Neurological: Negative.  Negative for dizziness, tremors, sensory change, speech change, focal weakness, weakness and headaches.  Endo/Heme/Allergies: Does not bruise/bleed easily.       Diabetes.  Blood sugar has been "fair".  Psychiatric/Behavioral: Negative.  Negative for depression and memory loss. The patient is not nervous/anxious and does not have insomnia.   All other systems reviewed and are negative.  Performance status (ECOG): 0  Physical Exam  Constitutional: He is oriented to person, place, and time. He appears well-developed and well-nourished. No distress.  HENT:  Head: Normocephalic and atraumatic.  Short gray hair.  Eyes: Conjunctivae and EOM are normal. No scleral icterus.  Glasses. Blue eyes.    Neurological: He is alert and oriented to person, place, and time.  Psychiatric: He has a normal mood and affect. His behavior is normal. Judgment and thought content normal.  Nursing note reviewed.   Appointment on 07/25/2019   Component Date Value Ref Range Status  . Uric Acid, Serum 07/25/2019 5.7  3.7 - 8.6 mg/dL Final   Performed at [facility], [address] [place], [place] [address]  . Sodium 07/25/2019 134* 135 - 145 mmol/L Final  . Potassium 07/25/2019 4.6  3.5 - 5.1 mmol/L Final  . Chloride 07/25/2019 103  98 - 111 mmol/L Final  . CO2 07/25/2019 23  22 - 32 mmol/L Final  . Glucose, Bld 07/25/2019 150* 70 - 99 mg/dL Final  . BUN 07/25/2019 12  8 - 23 mg/dL Final  . Creatinine, Ser 07/25/2019 0.86  0.61 - 1.24 mg/dL Final  . Calcium 07/25/2019 9.3  8.9 - 10.3 mg/dL Final  . Total Protein 07/25/2019 7.4  6.5 - 8.1 g/dL Final  . Albumin 07/25/2019 4.2  3.5 - 5.0 g/dL Final  . AST 07/25/2019 18  15 - 41 U/L Final  . ALT 07/25/2019 20  0 - 44 U/L Final  .  Alkaline Phosphatase 07/25/2019 52  38 - 126 U/L Final  . Total Bilirubin 07/25/2019 0.8  0.3 - 1.2 mg/dL Final  . GFR calc non Af Amer 07/25/2019 >60  >60 mL/min Final  . GFR calc Af Amer 07/25/2019 >60  >60 mL/min Final  . Anion gap 07/25/2019 8  5 - 15 Final   Performed at [facility], [address] [place], [place] [address]  . WBC 07/25/2019 30.3* 4.0 - 10.5 K/uL Final  . RBC 07/25/2019 4.64  4.22 - 5.81 MIL/uL Final  . Hemoglobin 07/25/2019 13.7  13.0 - 17.0 g/dL Final  . HCT 07/25/2019 43.1  39.0 - 52.0 % Final  . MCV 07/25/2019 92.9  80.0 - 100.0 fL Final  . MCH 07/25/2019 29.5  26.0 - 34.0 pg Final  . MCHC 07/25/2019 31.8  30.0 - 36.0 g/dL Final  . RDW 07/25/2019 14.9  11.5 - 15.5 % Final  . Platelets 07/25/2019 353  150 - 400 K/uL Final  . nRBC 07/25/2019 0.0  0.0 - 0.2 % Final  . Neutrophils Relative % 07/25/2019 27  % Final  . Neutro Abs 07/25/2019 8.1* 1.7 - 7.7 K/uL Final  . Lymphocytes Relative 07/25/2019 67  % Final  . Lymphs Abs 07/25/2019 20.4* 0.7 - 4.0 K/uL Final  . Monocytes Relative 07/25/2019 4  % Final  . Monocytes Absolute 07/25/2019 1.2* 0.1 - 1.0 K/uL Final  . Eosinophils Relative  07/25/2019 1  % Final  . Eosinophils Absolute 07/25/2019 0.4  0.0 - 0.5 K/uL Final  . Basophils Relative 07/25/2019 1  % Final  . Basophils Absolute 07/25/2019 0.2* 0.0 - 0.1 K/uL Final  . Immature Granulocytes 07/25/2019 0  % Final  . Abs Immature Granulocytes 07/25/2019 0.09* 0.00 - 0.07 K/uL Final   Performed at [facility], [address], [place], [place] [address]    Assessment:  Philip Robbins is a 79 y.o. male with stage 0 chronic lymphocytic leukemia. He has had lymphocytosis since 04/11/2015. WBC has ranged between 21,000 - 23,000.  Flow cytometryon 05/04/2015 confirmed CLL. There was a population of monoclonal B-lymphocytes that were restricted to the dim expression of kappa light chain immunoglobulin. The clonal B cells expressed CD5 and CD23. Expression of CD20 was dim. CD38 expression was in 55% of clonal B cells. There were no increased blasts. 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  Symptomatically, he is doing well.  He denies any B symptoms.  Plan: 1.   Labs today: CBC with diff, CMP, LDH, uric acid.  2.Chronic lymphocytic leukemia Clinically, he is doing well.    Hematocrit 43.1.  Hemoglobin 13.7.  Platelets 353,000.  WBC 30,300 (ANC 8100, ALC 20,400).             White blood cell count is stable. Review signs and symptoms of CLL warranting treatment.  Continue surveillance every 6 months. 3.RTC in 6 months for MD assessment and labs (CBC with diff, CMP, LDH, uric acid).  I discussed the assessment and treatment plan with the patient.  The patient was provided an opportunity to ask questions and all were answered.  The patient agreed with the plan and demonstrated an understanding of the instructions.  The patient was advised to call back if the symptoms worsen or if the condition fails to improve as anticipated.   Lequita Asal, MD, PhD    07/26/2019, 9:49 AM  I, Selena Batten, am acting as scribe  for [employer]. Mike Gip, MD,  PhD.  I, Oris Staffieri C. Mike Gip, MD, have reviewed the above documentation for accuracy and completeness, and I agree with the above.

## 2019-07-25 ENCOUNTER — Encounter: Payer: Self-pay | Admitting: Hematology and Oncology

## 2019-07-25 ENCOUNTER — Inpatient Hospital Stay: Payer: Medicare Other | Attending: Hematology and Oncology

## 2019-07-25 ENCOUNTER — Other Ambulatory Visit: Payer: Self-pay

## 2019-07-25 DIAGNOSIS — C9110 Chronic lymphocytic leukemia of B-cell type not having achieved remission: Secondary | ICD-10-CM | POA: Diagnosis not present

## 2019-07-25 DIAGNOSIS — Z7984 Long term (current) use of oral hypoglycemic drugs: Secondary | ICD-10-CM | POA: Insufficient documentation

## 2019-07-25 DIAGNOSIS — E119 Type 2 diabetes mellitus without complications: Secondary | ICD-10-CM | POA: Diagnosis not present

## 2019-07-25 LAB — CBC WITH DIFFERENTIAL/PLATELET
Abs Immature Granulocytes: 0.09 10*3/uL — ABNORMAL HIGH (ref 0.00–0.07)
Basophils Absolute: 0.2 10*3/uL — ABNORMAL HIGH (ref 0.0–0.1)
Basophils Relative: 1 %
Eosinophils Absolute: 0.4 10*3/uL (ref 0.0–0.5)
Eosinophils Relative: 1 %
HCT: 43.1 % (ref 39.0–52.0)
Hemoglobin: 13.7 g/dL (ref 13.0–17.0)
Immature Granulocytes: 0 %
Lymphocytes Relative: 67 %
Lymphs Abs: 20.4 10*3/uL — ABNORMAL HIGH (ref 0.7–4.0)
MCH: 29.5 pg (ref 26.0–34.0)
MCHC: 31.8 g/dL (ref 30.0–36.0)
MCV: 92.9 fL (ref 80.0–100.0)
Monocytes Absolute: 1.2 10*3/uL — ABNORMAL HIGH (ref 0.1–1.0)
Monocytes Relative: 4 %
Neutro Abs: 8.1 10*3/uL — ABNORMAL HIGH (ref 1.7–7.7)
Neutrophils Relative %: 27 %
Platelets: 353 10*3/uL (ref 150–400)
RBC: 4.64 MIL/uL (ref 4.22–5.81)
RDW: 14.9 % (ref 11.5–15.5)
WBC: 30.3 10*3/uL — ABNORMAL HIGH (ref 4.0–10.5)
nRBC: 0 % (ref 0.0–0.2)

## 2019-07-25 LAB — COMPREHENSIVE METABOLIC PANEL
ALT: 20 U/L (ref 0–44)
AST: 18 U/L (ref 15–41)
Albumin: 4.2 g/dL (ref 3.5–5.0)
Alkaline Phosphatase: 52 U/L (ref 38–126)
Anion gap: 8 (ref 5–15)
BUN: 12 mg/dL (ref 8–23)
CO2: 23 mmol/L (ref 22–32)
Calcium: 9.3 mg/dL (ref 8.9–10.3)
Chloride: 103 mmol/L (ref 98–111)
Creatinine, Ser: 0.86 mg/dL (ref 0.61–1.24)
GFR calc Af Amer: >60 mL/min (ref >60)
GFR calc non Af Amer: >60 mL/min (ref >60)
Glucose, Bld: 150 mg/dL — ABNORMAL HIGH (ref 70–99)
Potassium: 4.6 mmol/L (ref 3.5–5.1)
Sodium: 134 mmol/L — ABNORMAL LOW (ref 135–145)
Total Bilirubin: 0.8 mg/dL (ref 0.3–1.2)
Total Protein: 7.4 g/dL (ref 6.5–8.1)

## 2019-07-25 LAB — URIC ACID: Uric Acid, Serum: 5.7 mg/dL (ref 3.7–8.6)

## 2019-07-26 ENCOUNTER — Other Ambulatory Visit: Payer: Medicare Other

## 2019-07-26 ENCOUNTER — Encounter: Payer: Self-pay | Admitting: Hematology and Oncology

## 2019-07-26 ENCOUNTER — Inpatient Hospital Stay (HOSPITAL_BASED_OUTPATIENT_CLINIC_OR_DEPARTMENT_OTHER): Payer: Medicare Other | Admitting: Hematology and Oncology

## 2019-07-26 DIAGNOSIS — C9110 Chronic lymphocytic leukemia of B-cell type not having achieved remission: Secondary | ICD-10-CM | POA: Diagnosis not present

## 2020-01-23 ENCOUNTER — Ambulatory Visit: Payer: Medicare Other | Admitting: Nurse Practitioner

## 2020-01-23 ENCOUNTER — Other Ambulatory Visit: Payer: Medicare Other

## 2020-02-03 ENCOUNTER — Encounter: Payer: Self-pay | Admitting: Hematology and Oncology

## 2020-02-03 ENCOUNTER — Other Ambulatory Visit: Payer: Self-pay

## 2020-02-03 NOTE — Progress Notes (Signed)
No new changes noted today. The patient Name and DOB has been verified by phone today. 

## 2020-02-03 NOTE — Progress Notes (Signed)
[facility]  [address], [address] [place], [place] [address] Phone: [phone]  Fax: [phone]   Clinic Day:  02/06/2020  Referring physician: Sofie Hartigan, MD  Chief Complaint: Philip Robbins is a 79 y.o. male with stage 0 chronic lymphocytic leukemia who is seen for 6 month assessment.  HPI: The patient was last seen in the hematology clinic on 07/26/2019 via telemedicine. At that time, he was doing well.  He denied any B symptoms. Hematocrit was 43.1, hemoglobin 13.7, platelets 353,000, WBC 30,300 (ANC 8,100). Sodium was 134.  Uric acid was 5.7.  The patient received the COVID-19 vaccine on 08/24/2019 and 09/21/2019.  During the interim, he has been "good."  He denies fevers, sweats, weight loss, lumps and bumps, and bleeding. He notes some bruises on his arms; he is on an aspirin. He still has some frequency at night and uses eye drops for dry eyes. His IBS is "fair."  His A1C was 6.9 on 08/25/2019.   Past Medical History:  Diagnosis Date  . Arthritis    knee and back  . BPH (benign prostatic hypertrophy)   . DM type 2 (diabetes mellitus, type 2) ([place])   . Environmental allergies   . Headache    sinus headaches  . HLD (hyperlipidemia)     Past Surgical History:  Procedure Laterality Date  . CARDIAC CATHETERIZATION    . CARDIAC CATHETERIZATION     no stents placed  . KNEE ARTHROSCOPY Right 03/16/2015   Procedure: ARTHROSCOPY KNEE WITH PARTIAL MEDIAL MENISECTOMY, AND CHONDROPLASTY;  Surgeon: Leanor Kail, MD;  Location: [place];  Service: Orthopedics;  Laterality: Right;  Diabetic - oral meds    Family History  Problem Relation Age of Onset  . Renal Disease Father        ESRD  . Diabetes Father   . Breast cancer Sister     Social History:  reports that he has never smoked. He has never used smokeless tobacco. He reports that he does not drink alcohol and does not use drugs. 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  He states that he has not been able to do much secondary to the COVID-19 pandemic.  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  Allergies: No Known Allergies  Current Medications: Current Outpatient Medications  Medication Sig Dispense Refill  . acyclovir (ZOVIRAX) 200 MG capsule Take 400 mg by mouth daily.     Marland Kitchen aspirin (ASPIRIN EC) 81 MG EC tablet Take 81 mg by mouth daily. Swallow whole.    Marland Kitchen azelastine (ASTELIN) 0.1 % nasal [address] [place] into both nostrils 2 (two) times daily. Use in each nostril as directed    . cetirizine (ZYRTEC) 10 MG tablet Take 10 mg by mouth daily as needed.     Marland Kitchen lisinopril (PRINIVIL,ZESTRIL) 10 MG tablet Take 10 mg by mouth daily.    . metFORMIN (GLUCOPHAGE) 500 MG tablet Take by mouth 2 (two) times daily with a meal.    . pioglitazone (ACTOS) 45 MG tablet Take 45 mg by mouth daily.    . pravastatin (PRAVACHOL) 40 MG tablet Take 40 mg by mouth daily.    . Probiotic Product (PROBIOTIC ACIDOPHILUS) CAPS Take 1 capsule by mouth as needed.     . sitaGLIPtin (JANUVIA) 50 MG tablet Take 50 mg by mouth daily.     No current facility-administered medications for this visit.    Review of Systems  Constitutional: Negative for chills, diaphoresis, fever, malaise/fatigue and weight loss.       Feels "good".  HENT: Negative.  Negative for congestion, ear discharge, ear pain, hearing loss, nosebleeds, sinus pain, sore throat and tinnitus.   Eyes: Negative for blurred vision, double vision, photophobia, pain and redness.       Chronic dry eyes.  Respiratory: Negative.  Negative for cough, hemoptysis, sputum production and shortness of breath.   Cardiovascular: Negative.  Negative for chest pain, palpitations, orthopnea, leg swelling and PND.  Gastrointestinal: Negative for abdominal pain, blood in stool, constipation, diarrhea, heartburn, melena, nausea and vomiting.       Irritable bowel syndrome, "fair."  Genitourinary: Negative for dysuria,  frequency, hematuria and urgency.       Nocturia related to known BPH on tamulosin.  Musculoskeletal: Negative.  Negative for back pain, joint pain, myalgias and neck pain.  Skin: Negative.  Negative for itching and rash.  Neurological: Negative.  Negative for dizziness, tremors, sensory change, speech change, focal weakness, weakness and headaches.  Endo/Heme/Allergies: Bruises/bleeds easily (bruising on arms, on Aspirin).       Diabetes.  Blood sugar has been "fair".  Psychiatric/Behavioral: Negative.  Negative for depression and memory loss. The patient is not nervous/anxious and does not have insomnia.   All other systems reviewed and are negative.  Performance status (ECOG): 0  Blood pressure (!) 120/56, pulse 79, temperature (!) 96.1 F (35.6 C), temperature source Tympanic, resp. rate 16, weight (!) 220 lb 9.1 oz (100 kg), SpO2 97 %.   Physical Exam Nursing note reviewed.  Constitutional:      General: He is not in acute distress.    Appearance: He is well-developed. He is not diaphoretic.  HENT:     Head: Normocephalic and atraumatic.     Mouth/Throat:     Mouth: Mucous membranes are moist.     Pharynx: Oropharynx is clear.  Eyes:     General: No scleral icterus.    Extraocular Movements: Extraocular movements intact.     Conjunctiva/sclera: Conjunctivae normal.     Pupils: Pupils are equal, round, and reactive to light.     Comments: Glasses. Blue eyes.    Cardiovascular:     Rate and Rhythm: Regular rhythm.     Heart sounds: Normal heart sounds. No murmur heard.   Pulmonary:     Effort: Pulmonary effort is normal. No respiratory distress.     Breath sounds: Normal breath sounds. No wheezing or rales.  Chest:     Chest wall: No tenderness.  Abdominal:     General: Bowel sounds are normal. There is no distension.     Palpations: Abdomen is soft.     Tenderness: There is no abdominal tenderness. There is no guarding or rebound.  Musculoskeletal:        General: No  swelling or tenderness. Normal range of motion.     Cervical back: Normal range of motion and neck supple.  Lymphadenopathy:     Head:     Right side of head: No preauricular, posterior auricular or occipital adenopathy.     Left side of head: No preauricular, posterior auricular or occipital adenopathy.     Cervical: No cervical adenopathy.     Upper Body:     Right upper body: No supraclavicular or axillary adenopathy.     Left upper body: No supraclavicular or axillary adenopathy.     Lower Body: No right inguinal adenopathy. No left inguinal adenopathy.  Skin:  General: Skin is warm and dry.  Neurological:     Mental Status: He is alert and oriented to person, place, and time. Mental status is at baseline.  Psychiatric:        Mood and Affect: Mood normal.        Behavior: Behavior normal.        Thought Content: Thought content normal.        Judgment: Judgment normal.    No visits with results within 3 Day(s) from this visit.  Latest known visit with results is:  Appointment on 07/25/2019  Component Date Value Ref Range Status  . Uric Acid, Serum 07/25/2019 5.7  3.7 - 8.6 mg/dL Final   Performed at [facility], [address] [place], [place] [address]  . Sodium 07/25/2019 134* 135 - 145 mmol/L Final  . Potassium 07/25/2019 4.6  3.5 - 5.1 mmol/L Final  . Chloride 07/25/2019 103  98 - 111 mmol/L Final  . CO2 07/25/2019 23  22 - 32 mmol/L Final  . Glucose, Bld 07/25/2019 150* 70 - 99 mg/dL Final  . BUN 07/25/2019 12  8 - 23 mg/dL Final  . Creatinine, Ser 07/25/2019 0.86  0.61 - 1.24 mg/dL Final  . Calcium 07/25/2019 9.3  8.9 - 10.3 mg/dL Final  . Total Protein 07/25/2019 7.4  6.5 - 8.1 g/dL Final  . Albumin 07/25/2019 4.2  3.5 - 5.0 g/dL Final  . AST 07/25/2019 18  15 - 41 U/L Final  . ALT 07/25/2019 20  0 - 44 U/L Final  . Alkaline Phosphatase 07/25/2019 52  38 - 126 U/L Final  . Total Bilirubin 07/25/2019 0.8  0.3 - 1.2 mg/dL Final  . GFR calc non Af  Amer 07/25/2019 >60  >60 mL/min Final  . GFR calc Af Amer 07/25/2019 >60  >60 mL/min Final  . Anion gap 07/25/2019 8  5 - 15 Final   Performed at [facility], [address], [place], [place] [address]  . WBC 07/25/2019 30.3* 4.0 - 10.5 K/uL Final  . RBC 07/25/2019 4.64  4.22 - 5.81 MIL/uL Final  . Hemoglobin 07/25/2019 13.7  13.0 - 17.0 g/dL Final  . HCT 07/25/2019 43.1  39 - 52 % Final  . MCV 07/25/2019 92.9  80.0 - 100.0 fL Final  . MCH 07/25/2019 29.5  26.0 - 34.0 pg Final  . MCHC 07/25/2019 31.8  30.0 - 36.0 g/dL Final  . RDW 07/25/2019 14.9  11.5 - 15.5 % Final  . Platelets 07/25/2019 353  150 - 400 K/uL Final  . nRBC 07/25/2019 0.0  0.0 - 0.2 % Final  . Neutrophils Relative % 07/25/2019 27  % Final  . Neutro Abs 07/25/2019 8.1* 1.7 - 7.7 K/uL Final  . Lymphocytes Relative 07/25/2019 67  % Final  . Lymphs Abs 07/25/2019 20.4* 0.7 - 4.0 K/uL Final  . Monocytes Relative 07/25/2019 4  % Final  . Monocytes Absolute 07/25/2019 1.2* 0 - 1 K/uL Final  . Eosinophils Relative 07/25/2019 1  % Final  . Eosinophils Absolute 07/25/2019 0.4  0 - 0 K/uL Final  . Basophils Relative 07/25/2019 1  % Final  . Basophils Absolute 07/25/2019 0.2* 0 - 0 K/uL Final  . Immature Granulocytes 07/25/2019 0  % Final  . Abs Immature Granulocytes 07/25/2019 0.09* 0.00 - 0.07 K/uL Final   Performed at [facility], [address], [place], [place] [address]    Assessment:  WAYDEN SCHWERTNER is a  79 y.o. male with stage 0 chronic lymphocytic leukemia. He has had lymphocytosis since 04/11/2015. WBC has ranged between 21,000 - 23,000.  Flow cytometryon 05/04/2015 confirmed CLL. There was a population of monoclonal B-lymphocytes that were restricted to the dim expression of kappa light chain immunoglobulin. The clonal B cells expressed CD5 and CD23. Expression of CD20 was dim. CD38 expression was in 55% of clonal B cells. There were no increased blasts. FISH studies on 05/11/2015  revealed trisomy 12.  The patient received the COVID-19 vaccine on 08/24/2019 and 09/21/2019.  Symptomatically, he is doing well.  He denies any B symptoms.  He has a few bruises on aspirin.  Exam reveals no adenopathy or hepatosplenomegaly.  WBC is 30,400.  Plan: 1.   Labs today: CBC with diff, CMP, LDH, uric acid.  2.Chronic lymphocytic leukemia Clinically, he continues to do well  Hematocrit 44.1.  Hemoglobin 14.2.  Platelets 340,000.  WBC 30,400 (ANC 7300, ALC 20,400).             WBC remains stable. Encourage patient to contact clinic if any concerns during the interim.    Continue surveillance every 6 months. 3.   RN to call patient with today's labs. 4.   RTC in 6 months for MD assessment and labs (CBC with diff, CMP, LDH, uric acid).  I discussed the assessment and treatment plan with the patient.  The patient was provided an opportunity to ask questions and all were answered.  The patient agreed with the plan and demonstrated an understanding of the instructions.  The patient was advised to call back if the symptoms worsen or if the condition fails to improve as anticipated.   Philip Asal, MD, PhD    02/06/2020, 9:17 AM  I, Mirian Mo Tufford, am acting as Education administrator for [employer]. Mike Gip, MD, PhD.  I, Teofilo Lupinacci C. Mike Gip, MD, have reviewed the above documentation for accuracy and completeness, and I agree with the above.

## 2020-02-06 ENCOUNTER — Ambulatory Visit: Payer: Medicare Other | Admitting: Hematology and Oncology

## 2020-02-06 ENCOUNTER — Other Ambulatory Visit: Payer: Self-pay

## 2020-02-06 ENCOUNTER — Encounter: Payer: Self-pay | Admitting: Hematology and Oncology

## 2020-02-06 ENCOUNTER — Telehealth: Payer: Self-pay

## 2020-02-06 ENCOUNTER — Other Ambulatory Visit: Payer: Medicare Other

## 2020-02-06 ENCOUNTER — Inpatient Hospital Stay (HOSPITAL_BASED_OUTPATIENT_CLINIC_OR_DEPARTMENT_OTHER): Payer: Medicare Other | Admitting: Hematology and Oncology

## 2020-02-06 ENCOUNTER — Inpatient Hospital Stay: Payer: Medicare Other | Attending: Hematology and Oncology

## 2020-02-06 VITALS — BP 120/56 | HR 79 | Temp 96.1°F | Resp 16 | Wt 220.6 lb

## 2020-02-06 DIAGNOSIS — C9110 Chronic lymphocytic leukemia of B-cell type not having achieved remission: Secondary | ICD-10-CM

## 2020-02-06 DIAGNOSIS — E119 Type 2 diabetes mellitus without complications: Secondary | ICD-10-CM | POA: Insufficient documentation

## 2020-02-06 DIAGNOSIS — Z7984 Long term (current) use of oral hypoglycemic drugs: Secondary | ICD-10-CM | POA: Insufficient documentation

## 2020-02-06 DIAGNOSIS — K589 Irritable bowel syndrome without diarrhea: Secondary | ICD-10-CM | POA: Diagnosis not present

## 2020-02-06 DIAGNOSIS — Z7982 Long term (current) use of aspirin: Secondary | ICD-10-CM | POA: Insufficient documentation

## 2020-02-06 LAB — COMPREHENSIVE METABOLIC PANEL
ALT: 20 U/L (ref 0–44)
AST: 24 U/L (ref 15–41)
Albumin: 4.3 g/dL (ref 3.5–5.0)
Alkaline Phosphatase: 49 U/L (ref 38–126)
Anion gap: 7 (ref 5–15)
BUN: 7 mg/dL — ABNORMAL LOW (ref 8–23)
CO2: 25 mmol/L (ref 22–32)
Calcium: 9.2 mg/dL (ref 8.9–10.3)
Chloride: 104 mmol/L (ref 98–111)
Creatinine, Ser: 0.87 mg/dL (ref 0.61–1.24)
GFR calc Af Amer: >60 mL/min (ref >60)
GFR calc non Af Amer: >60 mL/min (ref >60)
Glucose, Bld: 134 mg/dL — ABNORMAL HIGH (ref 70–99)
Potassium: 4.6 mmol/L (ref 3.5–5.1)
Sodium: 136 mmol/L (ref 135–145)
Total Bilirubin: 0.6 mg/dL (ref 0.3–1.2)
Total Protein: 7.3 g/dL (ref 6.5–8.1)

## 2020-02-06 LAB — CBC WITH DIFFERENTIAL/PLATELET
Abs Immature Granulocytes: 0.11 10*3/uL — ABNORMAL HIGH (ref 0.00–0.07)
Basophils Absolute: 0.2 10*3/uL — ABNORMAL HIGH (ref 0.0–0.1)
Basophils Relative: 1 %
Eosinophils Absolute: 0.5 10*3/uL (ref 0.0–0.5)
Eosinophils Relative: 2 %
HCT: 44.1 % (ref 39.0–52.0)
Hemoglobin: 14.2 g/dL (ref 13.0–17.0)
Immature Granulocytes: 0 %
Lymphocytes Relative: 67 %
Lymphs Abs: 20.4 10*3/uL — ABNORMAL HIGH (ref 0.7–4.0)
MCH: 30.1 pg (ref 26.0–34.0)
MCHC: 32.2 g/dL (ref 30.0–36.0)
MCV: 93.4 fL (ref 80.0–100.0)
Monocytes Absolute: 2 10*3/uL — ABNORMAL HIGH (ref 0.1–1.0)
Monocytes Relative: 6 %
Neutro Abs: 7.3 10*3/uL (ref 1.7–7.7)
Neutrophils Relative %: 24 %
Platelets: 340 10*3/uL (ref 150–400)
RBC: 4.72 MIL/uL (ref 4.22–5.81)
RDW: 15.2 % (ref 11.5–15.5)
WBC: 30.4 10*3/uL — ABNORMAL HIGH (ref 4.0–10.5)
nRBC: 0 % (ref 0.0–0.2)

## 2020-02-06 LAB — LACTATE DEHYDROGENASE: LDH: 140 U/L (ref 98–192)

## 2020-02-06 LAB — URIC ACID: Uric Acid, Serum: 5.5 mg/dL (ref 3.7–8.6)

## 2020-02-06 NOTE — Telephone Encounter (Signed)
-----   Message from Lequita Asal, MD sent at 02/06/2020 11:55 AM EDT ----- Regarding: Please call patient  WBC is stable. ----- Message ----- From: Buel Ream, Lab In [place] Sent: 02/06/2020   9:53 AM EDT To: Lequita Asal, MD

## 2020-02-06 NOTE — Telephone Encounter (Signed)
Spoke with the patient to inform him, Per Dr Mike Gip his WBC are stable at this time. The patient was understanding and agreeable.

## 2020-08-07 ENCOUNTER — Other Ambulatory Visit: Payer: Self-pay

## 2020-08-07 DIAGNOSIS — C9110 Chronic lymphocytic leukemia of B-cell type not having achieved remission: Secondary | ICD-10-CM

## 2020-08-07 NOTE — Progress Notes (Signed)
[facility]  [address] [place], [place] [address] Phone: [phone]  Fax: [phone]   Clinic Day:  08/08/20  Referring physician: Sofie Hartigan, MD  Chief Complaint: Philip Robbins is a 80 y.o. male with stage 0 chronic lymphocytic leukemia who is seen for 6 month assessment.  HPI: The patient was last seen in the hematology clinic on 02/06/2020. At that time, he was doing well.  He denied any B symptoms.  He had a few bruises on aspirin.  Exam revealed no adenopathy or hepatosplenomegaly. Hematocrit was 44.1, hemoglobin 14.2, platelets 340,000, WBC 30,400 (ANC 7300; ALC 20,400). BUN was 7. Uric acid was 5.5. We discussed continued surveillance every 6 months.  The patient tested positive for COVID-19 on 04/07/2020. He did not have any severe symptoms. He received the Regeneron infusion on 04/09/2020.  During the interim, he has been "good." He has chronic dry eyes. He takes a probiotic for IBS. He bruises easily on his arms due to aspirin. He denies any recent infections, chest pain, shortness of breath, cough, lumps, or bumps.  Per patient, his last A1c was 6.7. He does not add extra salt to his diet.   Past Medical History:  Diagnosis Date  . Arthritis    knee and back  . BPH (benign prostatic hypertrophy)   . DM type 2 (diabetes mellitus, type 2) ([place])   . Environmental allergies   . Headache    sinus headaches  . HLD (hyperlipidemia)     Past Surgical History:  Procedure Laterality Date  . CARDIAC CATHETERIZATION    . CARDIAC CATHETERIZATION     no stents placed  . KNEE ARTHROSCOPY Right 03/16/2015   Procedure: ARTHROSCOPY KNEE WITH PARTIAL MEDIAL MENISECTOMY, AND CHONDROPLASTY;  Surgeon: Leanor Kail, MD;  Location: [facility];  Service: Orthopedics;  Laterality: Right;  Diabetic - oral meds    Family History  Problem Relation Age of Onset  . Renal Disease Father        ESRD  . Diabetes Father   . Breast  cancer Sister     Social History:  reports that he has never smoked. He has never used smokeless tobacco. He reports that he does not drink alcohol and does not use drugs. He is a full time Environmental education officer. He lives in [place] with is wife Wende Crease. 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  Allergies: No Known Allergies  Current Medications: Current Outpatient Medications  Medication Sig Dispense Refill  . acyclovir (ZOVIRAX) 200 MG capsule Take 400 mg by mouth daily.     Marland Kitchen aspirin 81 MG EC tablet Take 81 mg by mouth daily. Swallow whole.    Marland Kitchen azelastine (ASTELIN) 0.1 % nasal [address] [address] [place] into both nostrils as needed. Use in each nostril as directed    . cetirizine (ZYRTEC) 10 MG tablet Take 10 mg by mouth daily as needed.    Marland Kitchen lisinopril (PRINIVIL,ZESTRIL) 10 MG tablet Take 10 mg by mouth daily.    . metFORMIN (GLUCOPHAGE) 500 MG tablet Take by mouth 2 (two) times daily with a meal.    . pioglitazone (ACTOS) 45 MG tablet Take 45 mg by mouth daily.    . pravastatin (PRAVACHOL) 40 MG tablet Take 40 mg by mouth daily.    . Probiotic Product (PROBIOTIC ACIDOPHILUS) CAPS Take 1 capsule by mouth as needed.     . sitaGLIPtin (JANUVIA) 50 MG tablet Take 50 mg by mouth daily.     No current  facility-administered medications for this visit.    Review of Systems  Constitutional: Negative for chills, diaphoresis, fever, malaise/fatigue and weight loss (up 2 lbs).       Feels "good".  HENT: Negative.  Negative for congestion, ear discharge, ear pain, hearing loss, nosebleeds, sinus pain, sore throat and tinnitus.   Eyes: Negative for blurred vision, double vision, photophobia, pain and redness.       Chronic dry eyes.  Respiratory: Negative.  Negative for cough, hemoptysis, sputum production and shortness of breath.   Cardiovascular: Negative.  Negative for chest pain, palpitations, orthopnea, leg swelling and PND.  Gastrointestinal: Negative for abdominal pain, blood in stool, constipation, diarrhea, heartburn,  melena, nausea and vomiting.       Irritable bowel syndrome, takes probiotic  Genitourinary: Negative for dysuria, frequency, hematuria and urgency.       Nocturia related to known BPH on tamulosin.  Musculoskeletal: Negative.  Negative for back pain, joint pain, myalgias and neck pain.  Skin: Negative.  Negative for itching and rash.  Neurological: Negative.  Negative for dizziness, tremors, sensory change, speech change, focal weakness, weakness and headaches.  Endo/Heme/Allergies: Bruises/bleeds easily (bruising on arms, on Aspirin).       Diabetes.  Psychiatric/Behavioral: Negative.  Negative for depression and memory loss. The patient is not nervous/anxious and does not have insomnia.   All other systems reviewed and are negative.  Performance status (ECOG): 0  Vital Signs Blood pressure (!) 126/47, pulse 82, temperature 98.2 F (36.8 C), temperature source Oral, weight 222 lb 10.6 oz (101 kg), SpO2 96 %.  Physical Exam Nursing note reviewed.  Constitutional:      General: He is not in acute distress.    Appearance: He is well-developed. He is not diaphoretic.  HENT:     Head: Normocephalic and atraumatic.     Mouth/Throat:     Mouth: Mucous membranes are moist.     Pharynx: Oropharynx is clear.  Eyes:     General: No scleral icterus.    Extraocular Movements: Extraocular movements intact.     Conjunctiva/sclera: Conjunctivae normal.     Pupils: Pupils are equal, round, and reactive to light.     Comments: Glasses. Blue eyes.    Cardiovascular:     Rate and Rhythm: Regular rhythm.     Heart sounds: Normal heart sounds. No murmur heard.   Pulmonary:     Effort: Pulmonary effort is normal. No respiratory distress.     Breath sounds: Normal breath sounds. No wheezing or rales.  Chest:     Chest wall: No tenderness.  Breasts:     Right: No axillary adenopathy or supraclavicular adenopathy.     Left: No axillary adenopathy or supraclavicular adenopathy.    Abdominal:      General: Bowel sounds are normal. There is no distension.     Palpations: Abdomen is soft. There is no hepatomegaly or splenomegaly.     Tenderness: There is no abdominal tenderness. There is no guarding or rebound.  Musculoskeletal:        General: No swelling or tenderness. Normal range of motion.     Cervical back: Normal range of motion and neck supple.  Lymphadenopathy:     Head:     Right side of head: No preauricular, posterior auricular or occipital adenopathy.     Left side of head: No preauricular, posterior auricular or occipital adenopathy.     Cervical: No cervical adenopathy.     Upper Body:  Right upper body: No supraclavicular or axillary adenopathy.     Left upper body: No supraclavicular or axillary adenopathy.     Lower Body: No right inguinal adenopathy. No left inguinal adenopathy.  Skin:    General: Skin is warm and dry.  Neurological:     Mental Status: He is alert and oriented to person, place, and time. Mental status is at baseline.  Psychiatric:        Mood and Affect: Mood normal.        Behavior: Behavior normal.        Thought Content: Thought content normal.        Judgment: Judgment normal.    Appointment on 08/08/2020  Component Date Value Ref Range Status  . Uric Acid, Serum 08/08/2020 5.0  3.7 - 8.6 mg/dL Final   Performed at [facility], [address] [place], [place] [address]  . Sodium 08/08/2020 129* 135 - 145 mmol/L Final  . Potassium 08/08/2020 4.6  3.5 - 5.1 mmol/L Final  . Chloride 08/08/2020 96* 98 - 111 mmol/L Final  . CO2 08/08/2020 22  22 - 32 mmol/L Final  . Glucose, Bld 08/08/2020 145* 70 - 99 mg/dL Final   Glucose reference range applies only to samples taken after fasting for at least 8 hours.  . BUN 08/08/2020 15  8 - 23 mg/dL Final  . Creatinine, Ser 08/08/2020 0.81  0.61 - 1.24 mg/dL Final  . Calcium 08/08/2020 9.3  8.9 - 10.3 mg/dL Final  . Total Protein 08/08/2020 7.5  6.5 - 8.1 g/dL Final  .  Albumin 08/08/2020 4.5  3.5 - 5.0 g/dL Final  . AST 08/08/2020 21  15 - 41 U/L Final  . ALT 08/08/2020 20  0 - 44 U/L Final  . Alkaline Phosphatase 08/08/2020 51  38 - 126 U/L Final  . Total Bilirubin 08/08/2020 0.7  0.3 - 1.2 mg/dL Final  . GFR, Estimated 08/08/2020 >60  >60 mL/min Final   Comment: (NOTE) Calculated using the CKD-EPI Creatinine Equation (2021)   . Anion gap 08/08/2020 11  5 - 15 Final   Performed at [facility], [address] [place], [place] [address]  . WBC 08/08/2020 PENDING  4.0 - 10.5 K/uL Incomplete  . RBC 08/08/2020 4.94  4.22 - 5.81 MIL/uL Final  . Hemoglobin 08/08/2020 14.5  13.0 - 17.0 g/dL Final  . HCT 08/08/2020 44.6  39.0 - 52.0 % Final  . MCV 08/08/2020 90.3  80.0 - 100.0 fL Final  . MCH 08/08/2020 29.4  26.0 - 34.0 pg Final  . MCHC 08/08/2020 32.5  30.0 - 36.0 g/dL Final  . RDW 08/08/2020 15.2  11.5 - 15.5 % Final  . Platelets 08/08/2020 307  150 - 400 K/uL Final   Performed at [facility], [address] [place], [place] [address]  . nRBC 08/08/2020 PENDING  0.0 - 0.2 % Incomplete  . Neutrophils Relative % 08/08/2020 PENDING  % Incomplete  . Neutro Abs 08/08/2020 PENDING  1.7 - 7.7 K/uL Incomplete  . Band Neutrophils 08/08/2020 PENDING  % Incomplete  . Lymphocytes Relative 08/08/2020 PENDING  % Incomplete  . Lymphs Abs 08/08/2020 PENDING  0.7 - 4.0 K/uL Incomplete  . Monocytes Relative 08/08/2020 PENDING  % Incomplete  . Monocytes Absolute 08/08/2020 PENDING  0.1 - 1.0 K/uL Incomplete  . Eosinophils Relative 08/08/2020 PENDING  % Incomplete  . Eosinophils Absolute 08/08/2020 PENDING  0.0 - 0.5 K/uL Incomplete  . Basophils Relative  08/08/2020 PENDING  % Incomplete  . Basophils Absolute 08/08/2020 PENDING  0.0 - 0.1 K/uL Incomplete  . WBC Morphology 08/08/2020 PENDING   Incomplete  . RBC Morphology 08/08/2020 PENDING   Incomplete  . Smear Review 08/08/2020 PENDING   Incomplete  . Other 08/08/2020 PENDING  % Incomplete   . nRBC 08/08/2020 PENDING  0 /100 WBC Incomplete  . Metamyelocytes Relative 08/08/2020 PENDING  % Incomplete  . Myelocytes 08/08/2020 PENDING  % Incomplete  . Promyelocytes Relative 08/08/2020 PENDING  % Incomplete  . Blasts 08/08/2020 PENDING  % Incomplete  . Immature Granulocytes 08/08/2020 PENDING  % Incomplete  . Abs Immature Granulocytes 08/08/2020 PENDING  0.00 - 0.07 K/uL Incomplete    Assessment:  Philip Robbins is a 80 y.o. male with stage 0 chronic lymphocytic leukemia. He has had lymphocytosis since 04/11/2015. WBC has ranged between 21,000 - 23,000.  Flow cytometryon 05/04/2015 confirmed CLL. There was a population of monoclonal B-lymphocytes that were restricted to the dim expression of kappa light chain immunoglobulin. The clonal B cells expressed CD5 and CD23. Expression of CD20 was dim. CD38 expression was in 55% of clonal B cells. There were no increased blasts. 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  The patient received the COVID-19 vaccine on 08/24/2019 and 09/21/2019.  He tested positive for COVID-19 on 04/07/2020 and received the Regeneron infusion on 04/09/2020.  Symptomatically, he feels "good." He denies any fevers, sweats or weight loss.  Exam reveals no adenopathy or hepatosplenomegaly.  WBC is 28,400.  Plan: 1.   Labs today: CBC with diff, CMP, uric acid. 2.Chronic lymphocytic leukemia Clinically, he continues to do well.  Hematocrit 44.6.  Hemoglobin 14.5.  Platelets 307,000.  WBC 28,400 (ANC 7800, ALC 17,500).             WBC continues to fluctuate slightly. Continue surveillance every 6 months. 3.   Hyponatremia  Sodium is 129  Contact Dr Ellison Hughs office and [place] labs. 4.   RN:  Please call patient with WBC. 5.   RTC in 6 months for MD assessment and labs (CBC with diff, CMP, uric acid).  I discussed the assessment and treatment plan with the patient.  The patient was provided an opportunity to ask questions  and all were answered.  The patient agreed with the plan and demonstrated an understanding of the instructions.  The patient was advised to call back if the symptoms worsen or if the condition fails to improve as anticipated.   Lequita Asal, MD, PhD    08/08/2020, 10:03 AM  I, Mirian Mo Tufford, am acting as Education administrator for [employer]. Mike Gip, MD, PhD.  I, Guiseppe Flanagan C. Mike Gip, MD, have reviewed the above documentation for accuracy and completeness, and I agree with the above.

## 2020-08-08 ENCOUNTER — Encounter: Payer: Self-pay | Admitting: Hematology and Oncology

## 2020-08-08 ENCOUNTER — Telehealth: Payer: Self-pay

## 2020-08-08 ENCOUNTER — Inpatient Hospital Stay: Payer: Medicare Other | Attending: Hematology and Oncology

## 2020-08-08 ENCOUNTER — Inpatient Hospital Stay (HOSPITAL_BASED_OUTPATIENT_CLINIC_OR_DEPARTMENT_OTHER): Payer: Medicare Other | Admitting: Hematology and Oncology

## 2020-08-08 ENCOUNTER — Other Ambulatory Visit: Payer: Self-pay

## 2020-08-08 VITALS — BP 126/47 | HR 82 | Temp 98.2°F | Wt 222.7 lb

## 2020-08-08 DIAGNOSIS — E871 Hypo-osmolality and hyponatremia: Secondary | ICD-10-CM | POA: Insufficient documentation

## 2020-08-08 DIAGNOSIS — C9110 Chronic lymphocytic leukemia of B-cell type not having achieved remission: Secondary | ICD-10-CM | POA: Diagnosis present

## 2020-08-08 DIAGNOSIS — E119 Type 2 diabetes mellitus without complications: Secondary | ICD-10-CM | POA: Diagnosis not present

## 2020-08-08 DIAGNOSIS — Z7984 Long term (current) use of oral hypoglycemic drugs: Secondary | ICD-10-CM | POA: Diagnosis not present

## 2020-08-08 DIAGNOSIS — Z8616 Personal history of COVID-19: Secondary | ICD-10-CM | POA: Diagnosis not present

## 2020-08-08 LAB — CBC WITH DIFFERENTIAL/PLATELET
Abs Immature Granulocytes: 0.1 10*3/uL — ABNORMAL HIGH (ref 0.00–0.07)
Basophils Absolute: 0.2 10*3/uL — ABNORMAL HIGH (ref 0.0–0.1)
Basophils Relative: 1 %
Eosinophils Absolute: 0.5 10*3/uL (ref 0.0–0.5)
Eosinophils Relative: 2 %
HCT: 44.6 % (ref 39.0–52.0)
Hemoglobin: 14.5 g/dL (ref 13.0–17.0)
Immature Granulocytes: 0 %
Lymphocytes Relative: 61 %
Lymphs Abs: 17.5 10*3/uL — ABNORMAL HIGH (ref 0.7–4.0)
MCH: 29.4 pg (ref 26.0–34.0)
MCHC: 32.5 g/dL (ref 30.0–36.0)
MCV: 90.3 fL (ref 80.0–100.0)
Monocytes Absolute: 2.5 10*3/uL — ABNORMAL HIGH (ref 0.1–1.0)
Monocytes Relative: 9 %
Neutro Abs: 7.8 10*3/uL — ABNORMAL HIGH (ref 1.7–7.7)
Neutrophils Relative %: 27 %
Platelets: 307 10*3/uL (ref 150–400)
RBC: 4.94 MIL/uL (ref 4.22–5.81)
RDW: 15.2 % (ref 11.5–15.5)
Smear Review: NORMAL
WBC: 28.4 10*3/uL — ABNORMAL HIGH (ref 4.0–10.5)
nRBC: 0 % (ref 0.0–0.2)

## 2020-08-08 LAB — COMPREHENSIVE METABOLIC PANEL
ALT: 20 U/L (ref 0–44)
AST: 21 U/L (ref 15–41)
Albumin: 4.5 g/dL (ref 3.5–5.0)
Alkaline Phosphatase: 51 U/L (ref 38–126)
Anion gap: 11 (ref 5–15)
BUN: 15 mg/dL (ref 8–23)
CO2: 22 mmol/L (ref 22–32)
Calcium: 9.3 mg/dL (ref 8.9–10.3)
Chloride: 96 mmol/L — ABNORMAL LOW (ref 98–111)
Creatinine, Ser: 0.81 mg/dL (ref 0.61–1.24)
GFR, Estimated: >60 mL/min (ref >60)
Glucose, Bld: 145 mg/dL — ABNORMAL HIGH (ref 70–99)
Potassium: 4.6 mmol/L (ref 3.5–5.1)
Sodium: 129 mmol/L — ABNORMAL LOW (ref 135–145)
Total Bilirubin: 0.7 mg/dL (ref 0.3–1.2)
Total Protein: 7.5 g/dL (ref 6.5–8.1)

## 2020-08-08 LAB — URIC ACID: Uric Acid, Serum: 5 mg/dL (ref 3.7–8.6)

## 2020-08-08 NOTE — Telephone Encounter (Signed)
Patient aware.

## 2020-08-08 NOTE — Progress Notes (Signed)
Patient didn't have any concerns for today visit

## 2020-08-08 NOTE — Telephone Encounter (Signed)
-----   Message from Lequita Asal, MD sent at 08/08/2020  1:12 PM EST ----- Regarding: Please call patient with WBC  ----- Message ----- From: Interface, Lab In [place] Sent: 08/08/2020   9:25 AM EST To: Lequita Asal, MD

## 2021-01-23 DIAGNOSIS — H9193 Unspecified hearing loss, bilateral: Secondary | ICD-10-CM | POA: Insufficient documentation

## 2021-02-05 ENCOUNTER — Inpatient Hospital Stay: Payer: Medicare Other | Attending: Internal Medicine

## 2021-02-05 ENCOUNTER — Other Ambulatory Visit: Payer: Self-pay

## 2021-02-05 ENCOUNTER — Inpatient Hospital Stay (HOSPITAL_BASED_OUTPATIENT_CLINIC_OR_DEPARTMENT_OTHER): Payer: Medicare Other | Admitting: Internal Medicine

## 2021-02-05 ENCOUNTER — Encounter: Payer: Self-pay | Admitting: Internal Medicine

## 2021-02-05 DIAGNOSIS — E8881 Metabolic syndrome: Secondary | ICD-10-CM | POA: Insufficient documentation

## 2021-02-05 DIAGNOSIS — E119 Type 2 diabetes mellitus without complications: Secondary | ICD-10-CM | POA: Diagnosis not present

## 2021-02-05 DIAGNOSIS — C9110 Chronic lymphocytic leukemia of B-cell type not having achieved remission: Secondary | ICD-10-CM | POA: Insufficient documentation

## 2021-02-05 DIAGNOSIS — Z7984 Long term (current) use of oral hypoglycemic drugs: Secondary | ICD-10-CM | POA: Diagnosis not present

## 2021-02-05 DIAGNOSIS — E871 Hypo-osmolality and hyponatremia: Secondary | ICD-10-CM | POA: Insufficient documentation

## 2021-02-05 DIAGNOSIS — Z79899 Other long term (current) drug therapy: Secondary | ICD-10-CM | POA: Diagnosis not present

## 2021-02-05 LAB — CBC WITH DIFFERENTIAL/PLATELET
Abs Immature Granulocytes: 0.11 10*3/uL — ABNORMAL HIGH (ref 0.00–0.07)
Basophils Absolute: 0.2 10*3/uL — ABNORMAL HIGH (ref 0.0–0.1)
Basophils Relative: 1 %
Eosinophils Absolute: 0.4 10*3/uL (ref 0.0–0.5)
Eosinophils Relative: 1 %
HCT: 43.5 % (ref 39.0–52.0)
Hemoglobin: 14.4 g/dL (ref 13.0–17.0)
Immature Granulocytes: 0 %
Lymphocytes Relative: 57 %
Lymphs Abs: 16.9 10*3/uL — ABNORMAL HIGH (ref 0.7–4.0)
MCH: 29.1 pg (ref 26.0–34.0)
MCHC: 33.1 g/dL (ref 30.0–36.0)
MCV: 87.9 fL (ref 80.0–100.0)
Monocytes Absolute: 3.1 10*3/uL — ABNORMAL HIGH (ref 0.1–1.0)
Monocytes Relative: 10 %
Neutro Abs: 9.5 10*3/uL — ABNORMAL HIGH (ref 1.7–7.7)
Neutrophils Relative %: 31 %
Platelets: 389 10*3/uL (ref 150–400)
RBC: 4.95 MIL/uL (ref 4.22–5.81)
RDW: 15.4 % (ref 11.5–15.5)
WBC Morphology: ABNORMAL
WBC: 30.2 10*3/uL — ABNORMAL HIGH (ref 4.0–10.5)
nRBC: 0 % (ref 0.0–0.2)

## 2021-02-05 LAB — COMPREHENSIVE METABOLIC PANEL
ALT: 15 U/L (ref 0–44)
AST: 20 U/L (ref 15–41)
Albumin: 4.3 g/dL (ref 3.5–5.0)
Alkaline Phosphatase: 58 U/L (ref 38–126)
Anion gap: 8 (ref 5–15)
BUN: 11 mg/dL (ref 8–23)
CO2: 23 mmol/L (ref 22–32)
Calcium: 9.5 mg/dL (ref 8.9–10.3)
Chloride: 97 mmol/L — ABNORMAL LOW (ref 98–111)
Creatinine, Ser: 0.84 mg/dL (ref 0.61–1.24)
GFR, Estimated: >60 mL/min (ref >60)
Glucose, Bld: 147 mg/dL — ABNORMAL HIGH (ref 70–99)
Potassium: 4 mmol/L (ref 3.5–5.1)
Sodium: 128 mmol/L — ABNORMAL LOW (ref 135–145)
Total Bilirubin: 0.7 mg/dL (ref 0.3–1.2)
Total Protein: 7.7 g/dL (ref 6.5–8.1)

## 2021-02-05 LAB — URIC ACID: Uric Acid, Serum: 5.5 mg/dL (ref 3.7–8.6)

## 2021-02-05 NOTE — Progress Notes (Signed)
[facility] CONSULT NOTE  Patient Care Team: Sofie Hartigan, MD as PCP - General (Family Medicine)  CHIEF COMPLAINTS/PURPOSE OF CONSULTATION: CLL  #  Oncology History  CLL (chronic lymphocytic leukemia) ([place])  05/11/2015 Initial Diagnosis   CLL (chronic lymphocytic leukemia) ([place])      HISTORY OF PRESENTING ILLNESS:  Philip Robbins 80 y.o.  male history of diabetes obesity and CLL currently on surveillance is here for follow-up.  Patient denies any unusual joint pains or bone pain.  Denies any weight loss or night sweats.  Denies any early satiety.   Review of Systems  Constitutional:  Positive for malaise/fatigue. Negative for chills, diaphoresis, fever and weight loss.  HENT:  Negative for nosebleeds and sore throat.   Eyes:  Negative for double vision.  Respiratory:  Negative for cough, hemoptysis, sputum production, shortness of breath and wheezing.   Cardiovascular:  Negative for chest pain, palpitations, orthopnea and leg swelling.  Gastrointestinal:  Negative for abdominal pain, blood in stool, constipation, diarrhea, heartburn, melena, nausea and vomiting.  Genitourinary:  Negative for dysuria, frequency and urgency.  Musculoskeletal:  Positive for joint pain. Negative for back pain.  Skin: Negative.  Negative for itching and rash.  Neurological:  Negative for dizziness, tingling, focal weakness, weakness and headaches.  Endo/Heme/Allergies:  Does not bruise/bleed easily.  Psychiatric/Behavioral:  Negative for depression. The patient is not nervous/anxious and does not have insomnia.     MEDICAL HISTORY:  Past Medical History:  Diagnosis Date   Arthritis    knee and back   BPH (benign prostatic hypertrophy)    DM type 2 (diabetes mellitus, type 2) ([facility])    Environmental allergies    Headache    sinus headaches   HLD (hyperlipidemia)     SURGICAL HISTORY: Past Surgical History:  Procedure Laterality Date   CARDIAC CATHETERIZATION      CARDIAC CATHETERIZATION     no stents placed   KNEE ARTHROSCOPY Right 03/16/2015   Procedure: ARTHROSCOPY KNEE WITH PARTIAL MEDIAL MENISECTOMY, AND CHONDROPLASTY;  Surgeon: Leanor Kail, MD;  Location: [facility];  Service: Orthopedics;  Laterality: Right;  Diabetic - oral meds    SOCIAL HISTORY: Social History   Socioeconomic History   Marital status: Married    Spouse name: Not on file   Number of children: Not on file   Years of education: Not on file   Highest education level: Not on file  Occupational History   Occupation: minister  Tobacco Use   Smoking status: Never   Smokeless tobacco: Never  Vaping Use   Vaping Use: Never used  Substance and Sexual Activity   Alcohol use: No    Alcohol/week: 0.0 standard drinks   Drug use: No   Sexual activity: Not on file  Other Topics Concern   Not on file  Social History Narrative   Not on file   Social Determinants of Health   Financial Resource Strain: Not on file  Food Insecurity: Not on file  Transportation Needs: Not on file  Physical Activity: Not on file  Stress: Not on file  Social Connections: Not on file  Intimate Partner Violence: Not on file    FAMILY HISTORY: Family History  Problem Relation Age of Onset   Renal Disease Father        ESRD   Diabetes Father    Breast cancer Sister     ALLERGIES:  has No Known Allergies.  MEDICATIONS:  Current Outpatient Medications  Medication Sig  Dispense Refill   aspirin 81 MG EC tablet Take 81 mg by mouth daily. Swallow whole.     azelastine (ASTELIN) 0.1 % nasal [address] [place] into both nostrils as needed. Use in each nostril as directed     cetirizine (ZYRTEC) 10 MG tablet Take 10 mg by mouth daily as needed.     levothyroxine (SYNTHROID) 50 MCG tablet Take by mouth.     lisinopril (PRINIVIL,ZESTRIL) 10 MG tablet Take 10 mg by mouth daily.     metFORMIN (GLUCOPHAGE) 1000 MG tablet Take 1 tablet by mouth 2 (two) times daily with a meal.      metFORMIN (GLUCOPHAGE) 500 MG tablet Take by mouth 2 (two) times daily with a meal.     pioglitazone (ACTOS) 45 MG tablet Take 45 mg by mouth daily.     pravastatin (PRAVACHOL) 40 MG tablet Take 40 mg by mouth daily.     Probiotic Product (PROBIOTIC ACIDOPHILUS) CAPS Take 1 capsule by mouth as needed.      Propylene Glycol 0.6 % SOLN Apply to eye.     sitaGLIPtin (JANUVIA) 50 MG tablet Take 50 mg by mouth daily.     tamsulosin (FLOMAX) 0.4 MG CAPS capsule TAKE (1) CAPSULE DAILY.     acyclovir (ZOVIRAX) 200 MG capsule Take 400 mg by mouth daily.  (Patient not taking: Reported on 02/05/2021)     benzonatate (TESSALON) 200 MG capsule TAKE 1 CAPSULE (200 MG TOTAL) BY MOUTH 3 (THREE) TIMES DAILY AS NEEDED FOR COUGH. (Patient not taking: Reported on 02/05/2021)     predniSONE (DELTASONE) 20 MG tablet TAKE 1 TABLET BY MOUTH 2 TIMES DAILY FOR 3 DAYS. (Patient not taking: Reported on 02/05/2021)     No current facility-administered medications for this visit.      Marland Kitchen  PHYSICAL EXAMINATION: ECOG PERFORMANCE STATUS: 0 - Asymptomatic  Vitals:   02/05/21 0918  BP: 133/60  Pulse: 73  Resp: 18  Temp: (!) 96.8 F (36 C)  SpO2: 95%   Filed Weights   02/05/21 0918  Weight: 218 lb 7.6 oz (99.1 kg)    Physical Exam Vitals and nursing note reviewed.  Constitutional:      Comments: Alone; ambulating independently.   HENT:     Head: Normocephalic and atraumatic.     Mouth/Throat:     Pharynx: Oropharynx is clear.  Eyes:     Extraocular Movements: Extraocular movements intact.     Pupils: Pupils are equal, round, and reactive to light.  Cardiovascular:     Rate and Rhythm: Normal rate and regular rhythm.  Pulmonary:     Comments: Decreased breath sounds bilaterally.  Abdominal:     Palpations: Abdomen is soft.  Musculoskeletal:        General: Normal range of motion.     Cervical back: Normal range of motion.  Skin:    General: Skin is warm.  Neurological:     General: No focal deficit  present.     Mental Status: He is alert and oriented to person, place, and time.  Psychiatric:        Behavior: Behavior normal.        Judgment: Judgment normal.     LABORATORY DATA:  I have reviewed the data as listed Lab Results  Component Value Date   WBC 30.2 (H) 02/05/2021   HGB 14.4 02/05/2021   HCT 43.5 02/05/2021   MCV 87.9 02/05/2021   PLT 389 02/05/2021   Recent Labs  08/08/20 0918 02/05/21 0847  NA 129* 128*  K 4.6 4.0  CL 96* 97*  CO2 22 23  GLUCOSE 145* 147*  BUN 15 11  CREATININE 0.81 0.84  CALCIUM 9.3 9.5  GFRNONAA >60 >60  PROT 7.5 7.7  ALBUMIN 4.5 4.3  AST 21 20  ALT 20 15  ALKPHOS 51 58  BILITOT 0.7 0.7    RADIOGRAPHIC STUDIES: I have personally reviewed the radiological images as listed and agreed with the findings in the report. No results found.  ASSESSMENT & PLAN:   CLL (chronic lymphocytic leukemia) ([place]) #  Chronic lymphocytic leukemia:  Hemoglobin 14.5.  Platelets 307,000.  WBC 28,400 (ANC 7800, ALC 17,500).    Again reviewed the potential signs and symptoms of CLL/need for treatment.  Patient is asymptomatic.  Continue surveillance every 6 months.  # Hyponatremia: sodium 128?  Etiology-no medication adverse effect noted.  However 4 months ago-sodium 135 etiology-[defer further follow-up with PCP].  Discussed with Dr. Ellison Hughs.  # Age appropriate screening: Colonoscopy [last- one ([id number]; recommend PSA screening with PCP  #Metabolic syndrome /diabetes -I discussed importance of healthy weight/and weight loss.  Strongly recommend eating more green leafy vegetables and cutting down processed food/ carbohydrates.  Instead increasing whole grains / protein in the diet.  Multiple studies have shown that optimal weight would help improve cardiovascular risk; also shown to cut on the risk of malignancies-colon cancer, breast cancer ovarian/uterine cancer in women and also prostate cancer in men.   # DISPOSITION: #  Follow up MD- in 6  months; labs- cbc/cmp/LDH-Dr.B   cc; Dr.Feldpuasuch                            All questions were answered. The patient knows to call the clinic with any problems, questions or concerns.    Cammie Sickle, MD 02/05/2021 3:32 PM

## 2021-02-05 NOTE — Assessment & Plan Note (Addendum)
#   Chronic lymphocytic leukemia:  Hemoglobin 14.5.  Platelets 307,000.  WBC 28,400 (ANC 7800, ALC 17,500).    Again reviewed the potential signs and symptoms of CLL/need for treatment.  Patient is asymptomatic.  Continue surveillance every 6 months.  # Hyponatremia: sodium 128?  Etiology-no medication adverse effect noted.  However 4 months ago-sodium 135 etiology-[defer further follow-up with PCP].  Discussed with Dr. Ellison Hughs.  # Age appropriate screening: Colonoscopy [last- one ([id number]; recommend PSA screening with PCP  #Metabolic syndrome /diabetes -I discussed importance of healthy weight/and weight loss.  Strongly recommend eating more green leafy vegetables and cutting down processed food/ carbohydrates.  Instead increasing whole grains / protein in the diet.  Multiple studies have shown that optimal weight would help improve cardiovascular risk; also shown to cut on the risk of malignancies-colon cancer, breast cancer ovarian/uterine cancer in women and also prostate cancer in men.   # DISPOSITION: #  Follow up MD- in 6 months; labs- cbc/cmp/LDH-Dr.B   cc; Dr.Feldpuasuch

## 2021-08-06 ENCOUNTER — Encounter: Payer: Self-pay | Admitting: Internal Medicine

## 2021-08-06 ENCOUNTER — Inpatient Hospital Stay (HOSPITAL_BASED_OUTPATIENT_CLINIC_OR_DEPARTMENT_OTHER): Payer: Medicare Other | Admitting: Internal Medicine

## 2021-08-06 ENCOUNTER — Inpatient Hospital Stay: Payer: Medicare Other | Attending: Internal Medicine

## 2021-08-06 ENCOUNTER — Other Ambulatory Visit: Payer: Self-pay

## 2021-08-06 ENCOUNTER — Other Ambulatory Visit: Payer: Medicare Other

## 2021-08-06 ENCOUNTER — Ambulatory Visit: Payer: Medicare Other | Admitting: Internal Medicine

## 2021-08-06 DIAGNOSIS — C9110 Chronic lymphocytic leukemia of B-cell type not having achieved remission: Secondary | ICD-10-CM | POA: Insufficient documentation

## 2021-08-06 DIAGNOSIS — Z7984 Long term (current) use of oral hypoglycemic drugs: Secondary | ICD-10-CM | POA: Diagnosis not present

## 2021-08-06 DIAGNOSIS — E119 Type 2 diabetes mellitus without complications: Secondary | ICD-10-CM | POA: Diagnosis not present

## 2021-08-06 DIAGNOSIS — E871 Hypo-osmolality and hyponatremia: Secondary | ICD-10-CM | POA: Diagnosis not present

## 2021-08-06 LAB — CBC WITH DIFFERENTIAL/PLATELET
Abs Immature Granulocytes: 0.08 10*3/uL — ABNORMAL HIGH (ref 0.00–0.07)
Basophils Absolute: 0.2 10*3/uL — ABNORMAL HIGH (ref 0.0–0.1)
Basophils Relative: 1 %
Eosinophils Absolute: 0.3 10*3/uL (ref 0.0–0.5)
Eosinophils Relative: 1 %
HCT: 45.5 % (ref 39.0–52.0)
Hemoglobin: 14.6 g/dL (ref 13.0–17.0)
Immature Granulocytes: 0 %
Lymphocytes Relative: 62 %
Lymphs Abs: 17.7 10*3/uL — ABNORMAL HIGH (ref 0.7–4.0)
MCH: 29.6 pg (ref 26.0–34.0)
MCHC: 32.1 g/dL (ref 30.0–36.0)
MCV: 92.3 fL (ref 80.0–100.0)
Monocytes Absolute: 2.5 10*3/uL — ABNORMAL HIGH (ref 0.1–1.0)
Monocytes Relative: 9 %
Neutro Abs: 7.8 10*3/uL — ABNORMAL HIGH (ref 1.7–7.7)
Neutrophils Relative %: 27 %
Platelets: 319 10*3/uL (ref 150–400)
RBC: 4.93 MIL/uL (ref 4.22–5.81)
RDW: 15.5 % (ref 11.5–15.5)
Smear Review: NORMAL
WBC: 28.5 10*3/uL — ABNORMAL HIGH (ref 4.0–10.5)
nRBC: 0 % (ref 0.0–0.2)

## 2021-08-06 LAB — COMPREHENSIVE METABOLIC PANEL
ALT: 15 U/L (ref 0–44)
AST: 21 U/L (ref 15–41)
Albumin: 4.6 g/dL (ref 3.5–5.0)
Alkaline Phosphatase: 47 U/L (ref 38–126)
Anion gap: 8 (ref 5–15)
BUN: 12 mg/dL (ref 8–23)
CO2: 23 mmol/L (ref 22–32)
Calcium: 9.3 mg/dL (ref 8.9–10.3)
Chloride: 98 mmol/L (ref 98–111)
Creatinine, Ser: 0.86 mg/dL (ref 0.61–1.24)
GFR, Estimated: >60 mL/min (ref >60)
Glucose, Bld: 142 mg/dL — ABNORMAL HIGH (ref 70–99)
Potassium: 4.8 mmol/L (ref 3.5–5.1)
Sodium: 129 mmol/L — ABNORMAL LOW (ref 135–145)
Total Bilirubin: 0.8 mg/dL (ref 0.3–1.2)
Total Protein: 7.4 g/dL (ref 6.5–8.1)

## 2021-08-06 LAB — LACTATE DEHYDROGENASE: LDH: 130 U/L (ref 98–192)

## 2021-08-06 NOTE — Progress Notes (Signed)
[facility] CONSULT NOTE  Patient Care Team: Sofie Hartigan, MD as PCP - General (Family Medicine)  CHIEF COMPLAINTS/PURPOSE OF CONSULTATION: CLL  #  Oncology History Overview Note  # CLL [since 2016]-on surveillance.  #    CLL (chronic lymphocytic leukemia) ([place])  05/11/2015 Initial Diagnosis   CLL (chronic lymphocytic leukemia) (HCC)      HISTORY OF PRESENTING ILLNESS:  Philip Robbins 81 y.o.  male history of diabetes obesity and CLL currently on surveillance is here for follow-up.  Patient denies any unusual joint pains or bone pain.  Denies any weight loss or night sweats.  Denies any early satiety.   Review of Systems  Constitutional:  Positive for malaise/fatigue. Negative for chills, diaphoresis, fever and weight loss.  HENT:  Negative for nosebleeds and sore throat.   Eyes:  Negative for double vision.  Respiratory:  Negative for cough, hemoptysis, sputum production, shortness of breath and wheezing.   Cardiovascular:  Negative for chest pain, palpitations, orthopnea and leg swelling.  Gastrointestinal:  Negative for abdominal pain, blood in stool, constipation, diarrhea, heartburn, melena, nausea and vomiting.  Genitourinary:  Negative for dysuria, frequency and urgency.  Musculoskeletal:  Positive for joint pain. Negative for back pain.  Skin: Negative.  Negative for itching and rash.  Neurological:  Negative for dizziness, tingling, focal weakness, weakness and headaches.  Endo/Heme/Allergies:  Does not bruise/bleed easily.  Psychiatric/Behavioral:  Negative for depression. The patient is not nervous/anxious and does not have insomnia.     MEDICAL HISTORY:  Past Medical History:  Diagnosis Date   Arthritis    knee and back   BPH (benign prostatic hypertrophy)    DM type 2 (diabetes mellitus, type 2) (HCC)    Environmental allergies    Headache    sinus headaches   HLD (hyperlipidemia)     SURGICAL HISTORY: Past Surgical History:   Procedure Laterality Date   CARDIAC CATHETERIZATION     CARDIAC CATHETERIZATION     no stents placed   KNEE ARTHROSCOPY Right 03/16/2015   Procedure: ARTHROSCOPY KNEE WITH PARTIAL MEDIAL MENISECTOMY, AND CHONDROPLASTY;  Surgeon: Leanor Kail, MD;  Location: [facility];  Service: Orthopedics;  Laterality: Right;  Diabetic - oral meds    SOCIAL HISTORY: Social History   Socioeconomic History   Marital status: Married    Spouse name: Not on file   Number of children: Not on file   Years of education: Not on file   Highest education level: Not on file  Occupational History   Occupation: minister  Tobacco Use   Smoking status: Never   Smokeless tobacco: Never  Vaping Use   Vaping Use: Never used  Substance and Sexual Activity   Alcohol use: No    Alcohol/week: 0.0 standard drinks   Drug use: No   Sexual activity: Not on file  Other Topics Concern   Not on file  Social History Narrative   Not on file   Social Determinants of Health   Financial Resource Strain: Not on file  Food Insecurity: Not on file  Transportation Needs: Not on file  Physical Activity: Not on file  Stress: Not on file  Social Connections: Not on file  Intimate Partner Violence: Not on file    FAMILY HISTORY: Family History  Problem Relation Age of Onset   Renal Disease Father        ESRD   Diabetes Father    Breast cancer Sister     ALLERGIES:  has  No Known Allergies.  MEDICATIONS:  Current Outpatient Medications  Medication Sig Dispense Refill   aspirin 81 MG EC tablet Take 81 mg by mouth daily. Swallow whole.     azelastine (ASTELIN) 0.1 % nasal [place] [address] [place] into both nostrils as needed. Use in each nostril as directed     cetirizine (ZYRTEC) 10 MG tablet Take 10 mg by mouth daily as needed.     levothyroxine (SYNTHROID) 50 MCG tablet Take by mouth.     lisinopril (PRINIVIL,ZESTRIL) 10 MG tablet Take 10 mg by mouth daily.     metFORMIN (GLUCOPHAGE) 1000 MG tablet Take  1 tablet by mouth 2 (two) times daily with a meal.     metFORMIN (GLUCOPHAGE) 500 MG tablet Take by mouth 2 (two) times daily with a meal.     pioglitazone (ACTOS) 45 MG tablet Take 45 mg by mouth daily.     pravastatin (PRAVACHOL) 40 MG tablet Take 40 mg by mouth daily.     Probiotic Product (PROBIOTIC ACIDOPHILUS) CAPS Take 1 capsule by mouth as needed.      Propylene Glycol 0.6 % SOLN Apply to eye.     sitaGLIPtin (JANUVIA) 50 MG tablet Take 50 mg by mouth daily.     tamsulosin (FLOMAX) 0.4 MG CAPS capsule TAKE (1) CAPSULE DAILY.     acyclovir (ZOVIRAX) 200 MG capsule Take 400 mg by mouth daily.  (Patient not taking: Reported on 02/05/2021)     benzonatate (TESSALON) 200 MG capsule TAKE 1 CAPSULE (200 MG TOTAL) BY MOUTH 3 (THREE) TIMES DAILY AS NEEDED FOR COUGH. (Patient not taking: Reported on 02/05/2021)     predniSONE (DELTASONE) 20 MG tablet TAKE 1 TABLET BY MOUTH 2 TIMES DAILY FOR 3 DAYS. (Patient not taking: Reported on 02/05/2021)     No current facility-administered medications for this visit.      Marland Kitchen  PHYSICAL EXAMINATION: ECOG PERFORMANCE STATUS: 0 - Asymptomatic  Vitals:   08/06/21 0957  BP: (!) 129/57  Pulse: 76  Resp: 17  Temp: 98.1 F (36.7 C)  SpO2: 98%   Filed Weights   08/06/21 0957  Weight: 219 lb (99.3 kg)    Physical Exam Vitals and nursing note reviewed.  Constitutional:      Comments: Alone; ambulating independently.   HENT:     Head: Normocephalic and atraumatic.     Mouth/Throat:     Pharynx: Oropharynx is clear.  Eyes:     Extraocular Movements: Extraocular movements intact.     Pupils: Pupils are equal, round, and reactive to light.  Cardiovascular:     Rate and Rhythm: Normal rate and regular rhythm.  Pulmonary:     Comments: Decreased breath sounds bilaterally.  Abdominal:     Palpations: Abdomen is soft.  Musculoskeletal:        General: Normal range of motion.     Cervical back: Normal range of motion.  Skin:    General: Skin is  warm.  Neurological:     General: No focal deficit present.     Mental Status: He is alert and oriented to person, place, and time.  Psychiatric:        Behavior: Behavior normal.        Judgment: Judgment normal.     LABORATORY DATA:  I have reviewed the data as listed Lab Results  Component Value Date   WBC 28.5 (H) 08/06/2021   HGB 14.6 08/06/2021   HCT 45.5 08/06/2021   MCV 92.3 08/06/2021  PLT 319 08/06/2021   Recent Labs    08/08/20 0918 02/05/21 0847 08/06/21 0943  NA 129* 128* 129*  K 4.6 4.0 4.8  CL 96* 97* 98  CO2 22 23 23   GLUCOSE 145* 147* 142*  BUN 15 11 12   CREATININE 0.81 0.84 0.86  CALCIUM 9.3 9.5 9.3  GFRNONAA >60 >60 >60  PROT 7.5 7.7 7.4  ALBUMIN 4.5 4.3 4.6  AST 21 20 21   ALT 20 15 15   ALKPHOS 51 58 47  BILITOT 0.7 0.7 0.8    RADIOGRAPHIC STUDIES: I have personally reviewed the radiological images as listed and agreed with the findings in the report. No results found.  ASSESSMENT & PLAN:   CLL (chronic lymphocytic leukemia) ([facility]) #  Chronic lymphocytic leukemia:  Hemoglobin 14.5.  Platelets 319,000.  WBC 28,400 (ANC 7800, ALC 17,500).    Again reviewed the potential signs and symptoms of CLL/need for treatment.  Patient is asymptomatic.  Continue surveillance every 6 months.  # Hyponatremia: sodium 129- STABLE over 1 year Discussed with Dr. Ellison Hughs [[email or web address]].  # weight loss; recommend cutting down carbs/ increase activity.   # DISPOSITION: #  Follow up MD- in 6 months; labs- cbc/cmp/LDH-Dr.B   cc; Dr.Feldpuasuch                            All questions were answered. The patient knows to call the clinic with any problems, questions or concerns.    Philip Sickle, MD 08/06/2021 10:57 AM

## 2021-08-06 NOTE — Assessment & Plan Note (Addendum)
#    Chronic lymphocytic leukemia:  Hemoglobin 14.5.  Platelets 319,000.  WBC 28,400 (ANC 7800, ALC 17,500).    Again reviewed the potential signs and symptoms of CLL/need for treatment.  Patient is asymptomatic.  Continue surveillance every 6 months.  # Hyponatremia: sodium 129- STABLE over 1 year Discussed with Dr. Ellison Hughs [[email or web address]].  # weight loss; recommend cutting down carbs/ increase activity.   # DISPOSITION: #  Follow up MD- in 6 months; labs- cbc/cmp/LDH-Dr.B   cc; Dr.Feldpuasuch

## 2021-08-06 NOTE — Progress Notes (Signed)
Patient here for oncology follow-up appointment, expresses no new concerns at this time.    

## 2022-02-04 ENCOUNTER — Ambulatory Visit: Payer: Medicare Other | Admitting: Internal Medicine

## 2022-02-04 ENCOUNTER — Inpatient Hospital Stay: Payer: Medicare Other | Attending: Nurse Practitioner

## 2022-02-04 ENCOUNTER — Inpatient Hospital Stay (HOSPITAL_BASED_OUTPATIENT_CLINIC_OR_DEPARTMENT_OTHER): Payer: Medicare Other | Admitting: Nurse Practitioner

## 2022-02-04 VITALS — BP 123/55 | HR 76 | Temp 97.7°F | Resp 16 | Wt 223.0 lb

## 2022-02-04 DIAGNOSIS — C9110 Chronic lymphocytic leukemia of B-cell type not having achieved remission: Secondary | ICD-10-CM | POA: Diagnosis present

## 2022-02-04 DIAGNOSIS — E871 Hypo-osmolality and hyponatremia: Secondary | ICD-10-CM | POA: Diagnosis not present

## 2022-02-04 DIAGNOSIS — E119 Type 2 diabetes mellitus without complications: Secondary | ICD-10-CM | POA: Insufficient documentation

## 2022-02-04 DIAGNOSIS — Z79899 Other long term (current) drug therapy: Secondary | ICD-10-CM | POA: Insufficient documentation

## 2022-02-04 LAB — CBC
HCT: 41.6 % (ref 39.0–52.0)
Hemoglobin: 13.8 g/dL (ref 13.0–17.0)
MCH: 30.5 pg (ref 26.0–34.0)
MCHC: 33.2 g/dL (ref 30.0–36.0)
MCV: 92 fL (ref 80.0–100.0)
Platelets: 301 10*3/uL (ref 150–400)
RBC: 4.52 MIL/uL (ref 4.22–5.81)
RDW: 14.8 % (ref 11.5–15.5)
WBC: 29.1 10*3/uL — ABNORMAL HIGH (ref 4.0–10.5)
nRBC: 0 % (ref 0.0–0.2)

## 2022-02-04 LAB — LACTATE DEHYDROGENASE: LDH: 111 U/L (ref 98–192)

## 2022-02-04 LAB — COMPREHENSIVE METABOLIC PANEL
ALT: 16 U/L (ref 0–44)
AST: 21 U/L (ref 15–41)
Albumin: 4.1 g/dL (ref 3.5–5.0)
Alkaline Phosphatase: 45 U/L (ref 38–126)
Anion gap: 4 — ABNORMAL LOW (ref 5–15)
BUN: 10 mg/dL (ref 8–23)
CO2: 23 mmol/L (ref 22–32)
Calcium: 8.9 mg/dL (ref 8.9–10.3)
Chloride: 102 mmol/L (ref 98–111)
Creatinine, Ser: 0.7 mg/dL (ref 0.61–1.24)
GFR, Estimated: >60 mL/min (ref >60)
Glucose, Bld: 146 mg/dL — ABNORMAL HIGH (ref 70–99)
Potassium: 4.2 mmol/L (ref 3.5–5.1)
Sodium: 129 mmol/L — ABNORMAL LOW (ref 135–145)
Total Bilirubin: 0.5 mg/dL (ref 0.3–1.2)
Total Protein: 7.1 g/dL (ref 6.5–8.1)

## 2022-02-04 NOTE — Progress Notes (Signed)
Returns for follow-up of CLL. Denies any problems or concerns.

## 2022-02-04 NOTE — Progress Notes (Signed)
[facility] CONSULT NOTE  Patient Care Team: Sofie Hartigan, MD as PCP - General (Family Medicine)  CHIEF COMPLAINTS/PURPOSE OF CONSULTATION: CLL  #  Oncology History Overview Note  # CLL [since 2016]-on surveillance.  #    CLL (chronic lymphocytic leukemia) ([facility])  05/11/2015 Initial Diagnosis   CLL (chronic lymphocytic leukemia) (HCC)     HISTORY OF PRESENTING ILLNESS:  Philip Robbins 81 y.o.  male history of diabetes obesity and CLL currently on surveillance is here for follow-up.  Patient denies any unusual joint pains or bone pain.  Denies any weight loss or night sweats.  Denies any early satiety. Feels well.    Review of Systems  Constitutional:  Positive for malaise/fatigue. Negative for chills, diaphoresis, fever and weight loss.  HENT:  Negative for nosebleeds and sore throat.   Eyes:  Negative for double vision.  Respiratory:  Negative for cough, hemoptysis, sputum production, shortness of breath and wheezing.   Cardiovascular:  Negative for chest pain, palpitations, orthopnea and leg swelling.  Gastrointestinal:  Negative for abdominal pain, blood in stool, constipation, diarrhea, heartburn, melena, nausea and vomiting.  Genitourinary:  Negative for dysuria, frequency and urgency.  Musculoskeletal:  Positive for joint pain. Negative for back pain.  Skin: Negative.  Negative for itching and rash.  Neurological:  Negative for dizziness, tingling, focal weakness, weakness and headaches.  Endo/Heme/Allergies:  Does not bruise/bleed easily.  Psychiatric/Behavioral:  Negative for depression. The patient is not nervous/anxious and does not have insomnia.      MEDICAL HISTORY:  Past Medical History:  Diagnosis Date   Arthritis    knee and back   BPH (benign prostatic hypertrophy)    DM type 2 (diabetes mellitus, type 2) (HCC)    Environmental allergies    Headache    sinus headaches   HLD (hyperlipidemia)     SURGICAL HISTORY: Past Surgical  History:  Procedure Laterality Date   CARDIAC CATHETERIZATION     CARDIAC CATHETERIZATION     no stents placed   KNEE ARTHROSCOPY Right 03/16/2015   Procedure: ARTHROSCOPY KNEE WITH PARTIAL MEDIAL MENISECTOMY, AND CHONDROPLASTY;  Surgeon: Leanor Kail, MD;  Location: [facility];  Service: Orthopedics;  Laterality: Right;  Diabetic - oral meds    SOCIAL HISTORY: Social History   Socioeconomic History   Marital status: Married    Spouse name: Not on file   Number of children: Not on file   Years of education: Not on file   Highest education level: Not on file  Occupational History   Occupation: minister  Tobacco Use   Smoking status: Never   Smokeless tobacco: Never  Vaping Use   Vaping Use: Never used  Substance and Sexual Activity   Alcohol use: No    Alcohol/week: 0.0 standard drinks of alcohol   Drug use: No   Sexual activity: Not on file  Other Topics Concern   Not on file  Social History Narrative   Not on file   Social Determinants of Health   Financial Resource Strain: Not on file  Food Insecurity: Not on file  Transportation Needs: Not on file  Physical Activity: Not on file  Stress: Not on file  Social Connections: Not on file  Intimate Partner Violence: Not on file    FAMILY HISTORY: Family History  Problem Relation Age of Onset   Renal Disease Father        ESRD   Diabetes Father    Breast cancer Sister  ALLERGIES:  has No Known Allergies.  MEDICATIONS:  Current Outpatient Medications  Medication Sig Dispense Refill   acyclovir (ZOVIRAX) 200 MG capsule Take 400 mg by mouth daily.  (Patient not taking: Reported on 02/05/2021)     aspirin 81 MG EC tablet Take 81 mg by mouth daily. Swallow whole.     azelastine (ASTELIN) 0.1 % nasal [place] 1 [place] into both nostrils as needed. Use in each nostril as directed     benzonatate (TESSALON) 200 MG capsule TAKE 1 CAPSULE (200 MG TOTAL) BY MOUTH 3 (THREE) TIMES DAILY AS NEEDED FOR COUGH.  (Patient not taking: Reported on 02/05/2021)     cetirizine (ZYRTEC) 10 MG tablet Take 10 mg by mouth daily as needed.     levothyroxine (SYNTHROID) 50 MCG tablet Take by mouth.     lisinopril (PRINIVIL,ZESTRIL) 10 MG tablet Take 10 mg by mouth daily.     metFORMIN (GLUCOPHAGE) 1000 MG tablet Take 1 tablet by mouth 2 (two) times daily with a meal.     metFORMIN (GLUCOPHAGE) 500 MG tablet Take by mouth 2 (two) times daily with a meal.     pioglitazone (ACTOS) 45 MG tablet Take 45 mg by mouth daily.     pravastatin (PRAVACHOL) 40 MG tablet Take 40 mg by mouth daily.     predniSONE (DELTASONE) 20 MG tablet TAKE 1 TABLET BY MOUTH 2 TIMES DAILY FOR 3 DAYS. (Patient not taking: Reported on 02/05/2021)     Probiotic Product (PROBIOTIC ACIDOPHILUS) CAPS Take 1 capsule by mouth as needed.      Propylene Glycol 0.6 % SOLN Apply to eye.     sitaGLIPtin (JANUVIA) 50 MG tablet Take 50 mg by mouth daily.     tamsulosin (FLOMAX) 0.4 MG CAPS capsule TAKE (1) CAPSULE DAILY.     No current facility-administered medications for this visit.   Marland Kitchen  PHYSICAL EXAMINATION: ECOG PERFORMANCE STATUS: 0 - Asymptomatic  Vitals:   02/04/22 1048  BP: (!) 123/55  Pulse: 76  Resp: 16  Temp: 97.7 F (36.5 C)  SpO2: 98%   Filed Weights   02/04/22 1048  Weight: 223 lb (101.2 kg)   Physical Exam Vitals and nursing note reviewed.  Constitutional:      Comments: Alone; ambulating independently.   HENT:     Head: Normocephalic and atraumatic.     Mouth/Throat:     Pharynx: Oropharynx is clear.  Eyes:     Extraocular Movements: Extraocular movements intact.     Pupils: Pupils are equal, round, and reactive to light.  Cardiovascular:     Rate and Rhythm: Normal rate and regular rhythm.  Pulmonary:     Comments: Decreased breath sounds bilaterally.  Abdominal:     Palpations: Abdomen is soft.  Musculoskeletal:        General: Normal range of motion.     Cervical back: Normal range of motion.  Skin:     General: Skin is warm.  Neurological:     General: No focal deficit present.     Mental Status: He is alert and oriented to person, place, and time.  Psychiatric:        Behavior: Behavior normal.        Judgment: Judgment normal.    LABORATORY DATA:  I have reviewed the data as listed Lab Results  Component Value Date   WBC 29.1 (H) 02/04/2022   HGB 13.8 02/04/2022   HCT 41.6 02/04/2022   MCV 92.0 02/04/2022   PLT 301  02/04/2022   Recent Labs    02/05/21 0847 08/06/21 0943 02/04/22 0950  NA 128* 129* 129*  K 4.0 4.8 4.2  CL 97* 98 102  CO2 '23 23 23  '$ GLUCOSE 147* 142* 146*  BUN '11 12 10  '$ CREATININE 0.84 0.86 0.70  CALCIUM 9.5 9.3 8.9  GFRNONAA >60 >60 >60  PROT 7.7 7.4 7.1  ALBUMIN 4.3 4.6 4.1  AST '20 21 21  '$ ALT '15 15 16  '$ ALKPHOS 58 47 45  BILITOT 0.7 0.8 0.5    RADIOGRAPHIC STUDIES: I have personally reviewed the radiological images as listed and agreed with the findings in the report. No results found.  ASSESSMENT & PLAN:   CLL (chronic lymphocytic leukemia) ([facility]) # Chronic lymphocytic leukemia:  Hemoglobin 13.8.  Platelets 301,000.  WBC 29,100. Again reviewed the potential signs and symptoms of CLL/need for treatment. Patient is asymptomatic.  Continue surveillance every 6 months.  # Hyponatremia: sodium 129- STABLE over 1 year. Defer to pcp.    # weight loss; recommend cutting down carbs/ increase activity.    # DISPOSITION: 6 mo- lab (cbc, cmp, ldh), Dr Rogue Bussing- [place]  No problem-specific Assessment & Plan notes found for this encounter.  All questions were answered. The patient knows to call the clinic with any problems, questions or concerns.    Verlon Au, NP 02/04/2022 10:53 AM

## 2022-08-06 ENCOUNTER — Other Ambulatory Visit: Payer: Self-pay | Admitting: *Deleted

## 2022-08-06 DIAGNOSIS — C9110 Chronic lymphocytic leukemia of B-cell type not having achieved remission: Secondary | ICD-10-CM

## 2022-08-07 ENCOUNTER — Encounter: Payer: Self-pay | Admitting: Internal Medicine

## 2022-08-07 ENCOUNTER — Inpatient Hospital Stay (HOSPITAL_BASED_OUTPATIENT_CLINIC_OR_DEPARTMENT_OTHER): Payer: Medicare Other | Admitting: Internal Medicine

## 2022-08-07 ENCOUNTER — Inpatient Hospital Stay: Payer: Medicare Other | Attending: Internal Medicine

## 2022-08-07 VITALS — BP 143/79 | HR 73 | Temp 96.9°F | Resp 18 | Wt 228.7 lb

## 2022-08-07 DIAGNOSIS — E669 Obesity, unspecified: Secondary | ICD-10-CM | POA: Insufficient documentation

## 2022-08-07 DIAGNOSIS — E119 Type 2 diabetes mellitus without complications: Secondary | ICD-10-CM | POA: Insufficient documentation

## 2022-08-07 DIAGNOSIS — C9110 Chronic lymphocytic leukemia of B-cell type not having achieved remission: Secondary | ICD-10-CM | POA: Diagnosis present

## 2022-08-07 DIAGNOSIS — Z79899 Other long term (current) drug therapy: Secondary | ICD-10-CM | POA: Diagnosis not present

## 2022-08-07 LAB — CBC WITH DIFFERENTIAL/PLATELET
Abs Immature Granulocytes: 0.07 10*3/uL (ref 0.00–0.07)
Basophils Absolute: 0.2 10*3/uL — ABNORMAL HIGH (ref 0.0–0.1)
Basophils Relative: 1 %
Eosinophils Absolute: 0.5 10*3/uL (ref 0.0–0.5)
Eosinophils Relative: 2 %
HCT: 44 % (ref 39.0–52.0)
Hemoglobin: 13.6 g/dL (ref 13.0–17.0)
Immature Granulocytes: 0 %
Lymphocytes Relative: 65 %
Lymphs Abs: 18 10*3/uL — ABNORMAL HIGH (ref 0.7–4.0)
MCH: 28.9 pg (ref 26.0–34.0)
MCHC: 30.9 g/dL (ref 30.0–36.0)
MCV: 93.6 fL (ref 80.0–100.0)
Monocytes Absolute: 2.4 10*3/uL — ABNORMAL HIGH (ref 0.1–1.0)
Monocytes Relative: 9 %
Neutro Abs: 6.3 10*3/uL (ref 1.7–7.7)
Neutrophils Relative %: 23 %
Platelets: 379 10*3/uL (ref 150–400)
RBC: 4.7 MIL/uL (ref 4.22–5.81)
RDW: 15.3 % (ref 11.5–15.5)
WBC: 27.4 10*3/uL — ABNORMAL HIGH (ref 4.0–10.5)
nRBC: 0.1 % (ref 0.0–0.2)

## 2022-08-07 LAB — COMPREHENSIVE METABOLIC PANEL
ALT: 19 U/L (ref 0–44)
AST: 23 U/L (ref 15–41)
Albumin: 4.3 g/dL (ref 3.5–5.0)
Alkaline Phosphatase: 44 U/L (ref 38–126)
Anion gap: 10 (ref 5–15)
BUN: 14 mg/dL (ref 8–23)
CO2: 24 mmol/L (ref 22–32)
Calcium: 9.3 mg/dL (ref 8.9–10.3)
Chloride: 99 mmol/L (ref 98–111)
Creatinine, Ser: 0.72 mg/dL (ref 0.61–1.24)
GFR, Estimated: >60 mL/min (ref >60)
Glucose, Bld: 150 mg/dL — ABNORMAL HIGH (ref 70–99)
Potassium: 4.8 mmol/L (ref 3.5–5.1)
Sodium: 133 mmol/L — ABNORMAL LOW (ref 135–145)
Total Bilirubin: 0.5 mg/dL (ref 0.3–1.2)
Total Protein: 7.4 g/dL (ref 6.5–8.1)

## 2022-08-07 LAB — LACTATE DEHYDROGENASE: LDH: 118 U/L (ref 98–192)

## 2022-08-07 NOTE — Progress Notes (Signed)
[facility] CONSULT NOTE  Patient Care Team: Sofie Hartigan, MD as PCP - General (Family Medicine) Cammie Sickle, MD as Consulting Physician (Internal Medicine)  CHIEF COMPLAINTS/PURPOSE OF CONSULTATION: CLL  #  Oncology History Overview Note  # CLL [since 2016]-on surveillance.  #    CLL (chronic lymphocytic leukemia) ([facility])  05/11/2015 Initial Diagnosis   CLL (chronic lymphocytic leukemia) ([facility])      HISTORY OF PRESENTING ILLNESS: Alone. Ambulating independently.   Philip Robbins 82 y.o.  male history of diabetes obesity and CLL currently on surveillance is here for follow-up.  Patient denies any unusual joint pains or bone pain.  Denies any weight loss or night sweats.  Denies any early satiety.  Review of Systems  Constitutional:  Positive for malaise/fatigue. Negative for chills, diaphoresis, fever and weight loss.  HENT:  Negative for nosebleeds and sore throat.   Eyes:  Negative for double vision.  Respiratory:  Negative for cough, hemoptysis, sputum production, shortness of breath and wheezing.   Cardiovascular:  Negative for chest pain, palpitations, orthopnea and leg swelling.  Gastrointestinal:  Negative for abdominal pain, blood in stool, constipation, diarrhea, heartburn, melena, nausea and vomiting.  Genitourinary:  Negative for dysuria, frequency and urgency.  Musculoskeletal:  Positive for joint pain. Negative for back pain.  Skin: Negative.  Negative for itching and rash.  Neurological:  Negative for dizziness, tingling, focal weakness, weakness and headaches.  Endo/Heme/Allergies:  Does not bruise/bleed easily.  Psychiatric/Behavioral:  Negative for depression. The patient is not nervous/anxious and does not have insomnia.      MEDICAL HISTORY:  Past Medical History:  Diagnosis Date   Arthritis    knee and back   BPH (benign prostatic hypertrophy)    DM type 2 (diabetes mellitus, type 2) (HCC)    Environmental allergies     Headache    sinus headaches   HLD (hyperlipidemia)     SURGICAL HISTORY: Past Surgical History:  Procedure Laterality Date   CARDIAC CATHETERIZATION     CARDIAC CATHETERIZATION     no stents placed   KNEE ARTHROSCOPY Right 03/16/2015   Procedure: ARTHROSCOPY KNEE WITH PARTIAL MEDIAL MENISECTOMY, AND CHONDROPLASTY;  Surgeon: Leanor Kail, MD;  Location: [facility];  Service: Orthopedics;  Laterality: Right;  Diabetic - oral meds    SOCIAL HISTORY: Social History   Socioeconomic History   Marital status: Married    Spouse name: Not on file   Number of children: Not on file   Years of education: Not on file   Highest education level: Not on file  Occupational History   Occupation: minister  Tobacco Use   Smoking status: Never   Smokeless tobacco: Never  Vaping Use   Vaping Use: Never used  Substance and Sexual Activity   Alcohol use: No    Alcohol/week: 0.0 standard drinks of alcohol   Drug use: No   Sexual activity: Not on file  Other Topics Concern   Not on file  Social History Narrative   Not on file   Social Determinants of Health   Financial Resource Strain: Not on file  Food Insecurity: Not on file  Transportation Needs: Not on file  Physical Activity: Not on file  Stress: Not on file  Social Connections: Not on file  Intimate Partner Violence: Not on file    FAMILY HISTORY: Family History  Problem Relation Age of Onset   Renal Disease Father        ESRD  Diabetes Father    Breast cancer Sister     ALLERGIES:  has No Known Allergies.  MEDICATIONS:  Current Outpatient Medications  Medication Sig Dispense Refill   aspirin 81 MG EC tablet Take 81 mg by mouth daily. Swallow whole.     azelastine (ASTELIN) 0.1 % nasal [place] [address] [place] into both nostrils as needed. Use in each nostril as directed     cetirizine (ZYRTEC) 10 MG tablet Take 10 mg by mouth daily as needed.     levothyroxine (SYNTHROID) 50 MCG tablet Take by mouth.      lisinopril (PRINIVIL,ZESTRIL) 10 MG tablet Take 10 mg by mouth daily.     metFORMIN (GLUCOPHAGE) 500 MG tablet Take by mouth 2 (two) times daily with a meal.     pioglitazone (ACTOS) 45 MG tablet Take 45 mg by mouth daily.     pravastatin (PRAVACHOL) 40 MG tablet Take 40 mg by mouth daily.     Probiotic Product (PROBIOTIC ACIDOPHILUS) CAPS Take 1 capsule by mouth as needed.      Propylene Glycol 0.6 % SOLN Apply to eye.     sitaGLIPtin (JANUVIA) 50 MG tablet Take 50 mg by mouth daily.     tamsulosin (FLOMAX) 0.4 MG CAPS capsule TAKE (1) CAPSULE DAILY.     acyclovir (ZOVIRAX) 200 MG capsule Take 400 mg by mouth daily.  (Patient not taking: Reported on 02/05/2021)     benzonatate (TESSALON) 200 MG capsule TAKE 1 CAPSULE (200 MG TOTAL) BY MOUTH 3 (THREE) TIMES DAILY AS NEEDED FOR COUGH. (Patient not taking: Reported on 02/05/2021)     metFORMIN (GLUCOPHAGE) 1000 MG tablet Take 1 tablet by mouth 2 (two) times daily with a meal.     predniSONE (DELTASONE) 20 MG tablet TAKE 1 TABLET BY MOUTH 2 TIMES DAILY FOR 3 DAYS. (Patient not taking: Reported on 02/05/2021)     No current facility-administered medications for this visit.      Marland Kitchen  PHYSICAL EXAMINATION: ECOG PERFORMANCE STATUS: 0 - Asymptomatic  Vitals:   08/07/22 1000  BP: (!) 143/79  Pulse: 73  Resp: 18  Temp: (!) 96.9 F (36.1 C)   Filed Weights   08/07/22 1000  Weight: 228 lb 11.2 oz (103.7 kg)    Physical Exam Vitals and nursing note reviewed.  Constitutional:      Comments: Alone; ambulating independently.   HENT:     Head: Normocephalic and atraumatic.     Mouth/Throat:     Pharynx: Oropharynx is clear.  Eyes:     Extraocular Movements: Extraocular movements intact.     Pupils: Pupils are equal, round, and reactive to light.  Cardiovascular:     Rate and Rhythm: Normal rate and regular rhythm.  Pulmonary:     Comments: Decreased breath sounds bilaterally.  Abdominal:     Palpations: Abdomen is soft.   Musculoskeletal:        General: Normal range of motion.     Cervical back: Normal range of motion.  Skin:    General: Skin is warm.  Neurological:     General: No focal deficit present.     Mental Status: He is alert and oriented to person, place, and time.  Psychiatric:        Behavior: Behavior normal.        Judgment: Judgment normal.      LABORATORY DATA:  I have reviewed the data as listed Lab Results  Component Value Date   WBC 27.4 (H) 08/07/2022  HGB 13.6 08/07/2022   HCT 44.0 08/07/2022   MCV 93.6 08/07/2022   PLT 379 08/07/2022   Recent Labs    02/04/22 0950 08/07/22 0930  NA 129* 133*  K 4.2 4.8  CL 102 99  CO2 23 24  GLUCOSE 146* 150*  BUN 10 14  CREATININE 0.70 0.72  CALCIUM 8.9 9.3  GFRNONAA >60 >60  PROT 7.1 7.4  ALBUMIN 4.1 4.3  AST 21 23  ALT 16 19  ALKPHOS 45 44  BILITOT 0.5 0.5    RADIOGRAPHIC STUDIES: I have personally reviewed the radiological images as listed and agreed with the findings in the report. No results found.  ASSESSMENT & PLAN:   No problem-specific Assessment & Plan notes found for this encounter.  All questions were answered. The patient knows to call the clinic with any problems, questions or concerns.    Cammie Sickle, MD 08/07/2022 11:03 AM

## 2022-08-07 NOTE — Progress Notes (Signed)
Patient denies new problems/concerns today.   °

## 2022-08-07 NOTE — Assessment & Plan Note (Signed)
#  Chronic lymphocytic leukemia:  Hemoglobin 14.5.  Platelets 319,000.  WBC 32400 (ANC 7800, ALC 17,500).    Again reviewed the potential signs and symptoms of CLL/need for treatment.  Patient is asymptomatic.  Continue surveillance every 6 months.   # Hyponatremia: sodium 132- STABLE over 1 year Discussed with Dr. Ellison Hughs [[email or web address]].  # weight loss; recommend cutting down carbs/ increase activity.   # DISPOSITION: #  Follow up MD- in 6 months; labs- cbc/cmp/LDH-Dr.B  cc; Dr.Feldpuasuch

## 2022-12-21 DIAGNOSIS — I10 Essential (primary) hypertension: Secondary | ICD-10-CM | POA: Insufficient documentation

## 2022-12-21 DIAGNOSIS — N400 Enlarged prostate without lower urinary tract symptoms: Secondary | ICD-10-CM | POA: Insufficient documentation

## 2022-12-21 DIAGNOSIS — K589 Irritable bowel syndrome without diarrhea: Secondary | ICD-10-CM | POA: Insufficient documentation

## 2023-01-08 ENCOUNTER — Other Ambulatory Visit: Payer: Self-pay | Admitting: Family Medicine

## 2023-01-08 DIAGNOSIS — I6502 Occlusion and stenosis of left vertebral artery: Secondary | ICD-10-CM

## 2023-01-08 DIAGNOSIS — R42 Dizziness and giddiness: Secondary | ICD-10-CM

## 2023-01-14 ENCOUNTER — Ambulatory Visit
Admission: RE | Admit: 2023-01-14 | Discharge: 2023-01-14 | Disposition: A | Discharge status: Home / Self Care | Payer: Medicare Other | Source: Ambulatory Visit | Attending: Family Medicine | Admitting: Family Medicine

## 2023-01-14 DIAGNOSIS — I6502 Occlusion and stenosis of left vertebral artery: Secondary | ICD-10-CM | POA: Insufficient documentation

## 2023-01-14 DIAGNOSIS — R42 Dizziness and giddiness: Secondary | ICD-10-CM | POA: Insufficient documentation

## 2023-01-14 MED ORDER — IOHEXOL 350 MG/ML SOLN
75.0000 mL | Freq: Once | INTRAVENOUS | Status: AC | PRN
  Administered 2023-01-14: 75 mL via INTRAVENOUS

## 2023-02-05 ENCOUNTER — Inpatient Hospital Stay: Payer: Medicare Other | Attending: Internal Medicine | Admitting: Internal Medicine

## 2023-02-05 ENCOUNTER — Encounter: Payer: Self-pay | Admitting: Internal Medicine

## 2023-02-05 ENCOUNTER — Inpatient Hospital Stay: Payer: Medicare Other

## 2023-02-05 VITALS — BP 127/59 | HR 80 | Temp 96.8°F | Resp 18 | Ht 67.5 in | Wt 225.8 lb

## 2023-02-05 DIAGNOSIS — Z803 Family history of malignant neoplasm of breast: Secondary | ICD-10-CM | POA: Insufficient documentation

## 2023-02-05 DIAGNOSIS — E871 Hypo-osmolality and hyponatremia: Secondary | ICD-10-CM | POA: Diagnosis not present

## 2023-02-05 DIAGNOSIS — Z79899 Other long term (current) drug therapy: Secondary | ICD-10-CM | POA: Insufficient documentation

## 2023-02-05 DIAGNOSIS — Z125 Encounter for screening for malignant neoplasm of prostate: Secondary | ICD-10-CM | POA: Diagnosis not present

## 2023-02-05 DIAGNOSIS — C9110 Chronic lymphocytic leukemia of B-cell type not having achieved remission: Secondary | ICD-10-CM

## 2023-02-05 DIAGNOSIS — R9389 Abnormal findings on diagnostic imaging of other specified body structures: Secondary | ICD-10-CM | POA: Diagnosis not present

## 2023-02-05 DIAGNOSIS — N400 Enlarged prostate without lower urinary tract symptoms: Secondary | ICD-10-CM | POA: Diagnosis not present

## 2023-02-05 DIAGNOSIS — E119 Type 2 diabetes mellitus without complications: Secondary | ICD-10-CM | POA: Insufficient documentation

## 2023-02-05 DIAGNOSIS — E669 Obesity, unspecified: Secondary | ICD-10-CM | POA: Insufficient documentation

## 2023-02-05 LAB — CBC WITH DIFFERENTIAL/PLATELET
Abs Immature Granulocytes: 0.07 10*3/uL (ref 0.00–0.07)
Basophils Absolute: 0.2 10*3/uL — ABNORMAL HIGH (ref 0.0–0.1)
Basophils Relative: 1 %
Eosinophils Absolute: 0.5 10*3/uL (ref 0.0–0.5)
Eosinophils Relative: 2 %
HCT: 43.7 % (ref 39.0–52.0)
Hemoglobin: 13.8 g/dL (ref 13.0–17.0)
Immature Granulocytes: 0 %
Lymphocytes Relative: 65 %
Lymphs Abs: 18.7 10*3/uL — ABNORMAL HIGH (ref 0.7–4.0)
MCH: 29.4 pg (ref 26.0–34.0)
MCHC: 31.6 g/dL (ref 30.0–36.0)
MCV: 93.2 fL (ref 80.0–100.0)
Monocytes Absolute: 2.4 10*3/uL — ABNORMAL HIGH (ref 0.1–1.0)
Monocytes Relative: 8 %
Neutro Abs: 6.8 10*3/uL (ref 1.7–7.7)
Neutrophils Relative %: 24 %
Platelets: 302 10*3/uL (ref 150–400)
RBC: 4.69 MIL/uL (ref 4.22–5.81)
RDW: 15.4 % (ref 11.5–15.5)
Smear Review: NORMAL
WBC Morphology: ABNORMAL
WBC: 28.6 10*3/uL — ABNORMAL HIGH (ref 4.0–10.5)
nRBC: 0 % (ref 0.0–0.2)

## 2023-02-05 LAB — COMPREHENSIVE METABOLIC PANEL
ALT: 30 U/L (ref 0–44)
AST: 32 U/L (ref 15–41)
Albumin: 4.2 g/dL (ref 3.5–5.0)
Alkaline Phosphatase: 42 U/L (ref 38–126)
Anion gap: 7 (ref 5–15)
BUN: 8 mg/dL (ref 8–23)
CO2: 25 mmol/L (ref 22–32)
Calcium: 9.5 mg/dL (ref 8.9–10.3)
Chloride: 101 mmol/L (ref 98–111)
Creatinine, Ser: 0.8 mg/dL (ref 0.61–1.24)
GFR, Estimated: >60 mL/min (ref >60)
Glucose, Bld: 174 mg/dL — ABNORMAL HIGH (ref 70–99)
Potassium: 4.9 mmol/L (ref 3.5–5.1)
Sodium: 133 mmol/L — ABNORMAL LOW (ref 135–145)
Total Bilirubin: 0.5 mg/dL (ref 0.3–1.2)
Total Protein: 7 g/dL (ref 6.5–8.1)

## 2023-02-05 LAB — LACTATE DEHYDROGENASE: LDH: 131 U/L (ref 98–192)

## 2023-02-05 NOTE — Assessment & Plan Note (Addendum)
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  Again reviewed the potential signs and symptoms of CLL/need for treatment.  Patient is asymptomatic.  Continue surveillance every 6 months. Will order [facility]  # Hyponatremia: sodium 132- stable over 1 year Discussed with Dr. Maryjane Hurter [[email or web address]].  # Incidental CT July 2024- [PCP; pre-syncope]1.9 cm mass in the medial left parotid gland, which could represent a primary parotid neoplasm or an enlarged intraparotid lymph node. ENT consultation pending.   # Cancer screening: [place]- in 2021; none; PSA screening; skin cancer- referral to [facility]- skin cancer surveillance/ Hx of CLL  *[facility] # DISPOSITION: #  referral to  dermatology- skin cancer surveillaince/ Hx of CLL # Follow up MD- in 6 months; labs- cbc/cmp/LDH; PSA-Dr.B

## 2023-02-05 NOTE — Progress Notes (Signed)
Faxed referral to [facility] dermatology as requested.

## 2023-02-05 NOTE — Addendum Note (Signed)
Addended by: Darrold Span A on: 02/05/2023 11:38 AM   Modules accepted: Orders

## 2023-02-05 NOTE — Progress Notes (Signed)
[facility] CONSULT NOTE  Patient Care Team: Marina Goodell, MD as PCP - General (Family Medicine) Earna Coder, MD as Consulting Physician (Internal Medicine)  CHIEF COMPLAINTS/PURPOSE OF CONSULTATION: CLL  #  Oncology History Overview Note  # CLL [since 2016]-on surveillance.  #    CLL (chronic lymphocytic leukemia) (HCC)  05/11/2015 Initial Diagnosis   CLL (chronic lymphocytic leukemia) (HCC)     HISTORY OF PRESENTING ILLNESS: Alone. Ambulating independently.   Philip Robbins 82 y.o.  male history of diabetes obesity and CLL currently on surveillance is here for follow-up.  Patient states that he had a scan done on the back of his neck and wants to know if the provider has the results back.    Patient denies any unusual joint pains or bone pain.  Denies any weight loss or night sweats.  Denies any early satiety.  Review of Systems  Constitutional:  Positive for malaise/fatigue. Negative for chills, diaphoresis, fever and weight loss.  HENT:  Negative for nosebleeds and sore throat.   Eyes:  Negative for double vision.  Respiratory:  Negative for cough, hemoptysis, sputum production, shortness of breath and wheezing.   Cardiovascular:  Negative for chest pain, palpitations, orthopnea and leg swelling.  Gastrointestinal:  Negative for abdominal pain, blood in stool, constipation, diarrhea, heartburn, melena, nausea and vomiting.  Genitourinary:  Negative for dysuria, frequency and urgency.  Musculoskeletal:  Positive for joint pain. Negative for back pain.  Skin: Negative.  Negative for itching and rash.  Neurological:  Negative for dizziness, tingling, focal weakness, weakness and headaches.  Endo/Heme/Allergies:  Does not bruise/bleed easily.  Psychiatric/Behavioral:  Negative for depression. The patient is not nervous/anxious and does not have insomnia.      MEDICAL HISTORY:  Past Medical History:  Diagnosis Date   Arthritis    knee and  back   BPH (benign prostatic hypertrophy)    DM type 2 (diabetes mellitus, type 2) (HCC)    Environmental allergies    Headache    sinus headaches   HLD (hyperlipidemia)     SURGICAL HISTORY: Past Surgical History:  Procedure Laterality Date   CARDIAC CATHETERIZATION     CARDIAC CATHETERIZATION     no stents placed   KNEE ARTHROSCOPY Right 03/16/2015   Procedure: ARTHROSCOPY KNEE WITH PARTIAL MEDIAL MENISECTOMY, AND CHONDROPLASTY;  Surgeon: Erin Sons, MD;  Location: [facility];  Service: Orthopedics;  Laterality: Right;  Diabetic - oral meds    SOCIAL HISTORY: Social History   Socioeconomic History   Marital status: Married    Spouse name: Not on file   Number of children: Not on file   Years of education: Not on file   Highest education level: Not on file  Occupational History   Occupation: minister  Tobacco Use   Smoking status: Never   Smokeless tobacco: Never  Vaping Use   Vaping status: Never Used  Substance and Sexual Activity   Alcohol use: No    Alcohol/week: 0.0 standard drinks of alcohol   Drug use: No   Sexual activity: Not on file  Other Topics Concern   Not on file  Social History Narrative   Not on file   Social Determinants of Health   Financial Resource Strain: Not on file  Food Insecurity: Not on file  Transportation Needs: Not on file  Physical Activity: Not on file  Stress: Not on file  Social Connections: Not on file  Intimate Partner Violence: Not on file  FAMILY HISTORY: Family History  Problem Relation Age of Onset   Renal Disease Father        ESRD   Diabetes Father    Breast cancer Sister     ALLERGIES:  has No Known Allergies.  MEDICATIONS:  Current Outpatient Medications  Medication Sig Dispense Refill   aspirin 81 MG EC tablet Take 81 mg by mouth daily. Swallow whole.     azelastine (ASTELIN) 0.1 % nasal Kreis Place 1 Wohler into both nostrils as needed. Use in each nostril as directed     cetirizine  (ZYRTEC) 10 MG tablet Take 10 mg by mouth daily as needed.     levothyroxine (SYNTHROID) 50 MCG tablet Take by mouth.     lisinopril (PRINIVIL,ZESTRIL) 10 MG tablet Take 10 mg by mouth daily.     metFORMIN (GLUCOPHAGE) 1000 MG tablet Take 1 tablet by mouth 2 (two) times daily with a meal.     metFORMIN (GLUCOPHAGE) 500 MG tablet Take by mouth 2 (two) times daily with a meal.     pioglitazone (ACTOS) 45 MG tablet Take 45 mg by mouth daily.     pravastatin (PRAVACHOL) 40 MG tablet Take 40 mg by mouth daily.     Probiotic Product (PROBIOTIC ACIDOPHILUS) CAPS Take 1 capsule by mouth as needed.      Propylene Glycol 0.6 % SOLN Apply to eye.     sitaGLIPtin (JANUVIA) 50 MG tablet Take 50 mg by mouth daily.     tamsulosin (FLOMAX) 0.4 MG CAPS capsule TAKE (1) CAPSULE DAILY.     acyclovir (ZOVIRAX) 200 MG capsule Take 400 mg by mouth daily.  (Patient not taking: Reported on 02/05/2021)     benzonatate (TESSALON) 200 MG capsule TAKE 1 CAPSULE (200 MG TOTAL) BY MOUTH 3 (THREE) TIMES DAILY AS NEEDED FOR COUGH. (Patient not taking: Reported on 02/05/2021)     predniSONE (DELTASONE) 20 MG tablet TAKE 1 TABLET BY MOUTH 2 TIMES DAILY FOR 3 DAYS. (Patient not taking: Reported on 02/05/2021)     No current facility-administered medications for this visit.    PHYSICAL EXAMINATION: ECOG PERFORMANCE STATUS: 0 - Asymptomatic  Vitals:   02/05/23 1015  BP: (!) 127/59  Pulse: 80  Resp: 18  Temp: (!) 96.8 F (36 C)  SpO2: 98%   Filed Weights   02/05/23 1015  Weight: 225 lb 12.8 oz (102.4 kg)   Approximately 1 cm lymph node noted in the right axilla; NONE in the left axilla.  Physical Exam Vitals and nursing note reviewed.  Constitutional:      Comments: Alone; ambulating independently.   HENT:     Head: Normocephalic and atraumatic.     Mouth/Throat:     Pharynx: Oropharynx is clear.  Eyes:     Extraocular Movements: Extraocular movements intact.     Pupils: Pupils are equal, round, and reactive to  light.  Cardiovascular:     Rate and Rhythm: Normal rate and regular rhythm.  Pulmonary:     Comments: Decreased breath sounds bilaterally.  Abdominal:     Palpations: Abdomen is soft.  Musculoskeletal:        General: Normal range of motion.     Cervical back: Normal range of motion.  Skin:    General: Skin is warm.  Neurological:     General: No focal deficit present.     Mental Status: He is alert and oriented to person, place, and time.  Psychiatric:        Behavior: Behavior  normal.        Judgment: Judgment normal.      LABORATORY DATA:  I have reviewed the data as listed Lab Results  Component Value Date   WBC 28.6 (H) 02/05/2023   HGB 13.8 02/05/2023   HCT 43.7 02/05/2023   MCV 93.2 02/05/2023   PLT 302 02/05/2023   Recent Labs    08/07/22 0930 02/05/23 1004  NA 133* 133*  K 4.8 4.9  CL 99 101  CO2 24 25  GLUCOSE 150* 174*  BUN 14 8  CREATININE 0.72 0.80  CALCIUM 9.3 9.5  GFRNONAA >60 >60  PROT 7.4 7.0  ALBUMIN 4.3 4.2  AST 23 32  ALT 19 30  ALKPHOS 44 42  BILITOT 0.5 0.5    RADIOGRAPHIC STUDIES: I have personally reviewed the radiological images as listed and agreed with the findings in the report. CT ANGIO HEAD NECK W WO CM  Result Date: 01/22/2023 CLINICAL DATA:  Dizziness, vertebral artery occlusion suspected EXAM: CT ANGIOGRAPHY HEAD AND NECK WITH AND WITHOUT CONTRAST TECHNIQUE: Multidetector CT imaging of the head and neck was performed using the standard protocol during bolus administration of intravenous contrast. Multiplanar CT image reconstructions and MIPs were obtained to evaluate the vascular anatomy. Carotid stenosis measurements (when applicable) are obtained utilizing NASCET criteria, using the distal internal carotid diameter as the denominator. RADIATION DOSE REDUCTION: This exam was performed according to the departmental dose-optimization program which includes automated exposure control, adjustment of the mA and/or kV according to  patient size and/or use of iterative reconstruction technique. CONTRAST:  75mL OMNIPAQUE IOHEXOL 350 MG/ML SOLN COMPARISON:  None Available. FINDINGS: CT HEAD FINDINGS Brain: No evidence of acute infarct, hemorrhage, mass, mass effect, or midline shift. No hydrocephalus or extra-axial fluid collection. Vascular: No hyperdense vessel. Skull: Negative for fracture or focal lesion. Sinuses/Orbits: No acute finding. Postsurgical changes in the paranasal sinuses, with minimal mucosal thickening. Other: The mastoid air cells are well aerated. CTA NECK FINDINGS Aortic arch: Standard branching. Imaged portion shows no evidence of aneurysm or dissection. No significant stenosis of the major arch vessel origins. Mild aortic atherosclerosis. Right carotid system: No evidence of dissection, occlusion, or hemodynamically significant stenosis (greater than 50%). Left carotid system: No evidence of dissection, occlusion, or hemodynamically significant stenosis (greater than 50%). Vertebral arteries: No evidence of dissection, occlusion, or hemodynamically significant stenosis (greater than 50%). Skeleton: No acute osseous abnormality. Degenerative changes in the cervical spine. Other neck: Mass in the medial left parotid gland measures up to 1.9 x 1.4 x 1.7 cm (AP x TR x CC) (series 11, image 110 and series 12, image 309). Upper chest: No focal pulmonary opacity or pleural effusion. Review of the MIP images confirms the above findings CTA HEAD FINDINGS Anterior circulation: Both internal carotid arteries are patent to the termini, without significant stenosis. A1 segments patent. Normal anterior communicating artery. Anterior cerebral arteries are patent to their distal aspects without significant stenosis. No M1 stenosis or occlusion. MCA branches perfused to their distal aspects without significant stenosis. Posterior circulation: Vertebral arteries patent to the vertebrobasilar junction without significant stenosis. Posterior  inferior cerebellar arteries patent proximally. Basilar patent to its distal aspect without significant stenosis. Superior cerebellar arteries patent proximally. Patent P1 segments. PCAs perfused to their distal aspects without significant stenosis. The bilateral posterior communicating arteries are not visualized. Venous sinuses: As permitted by contrast timing, patent. Anatomic variants: None significant. Review of the MIP images confirms the above findings IMPRESSION: 1. No acute intracranial process.  2. No intracranial large vessel occlusion or significant stenosis. 3. No hemodynamically significant stenosis in the neck. 4. 1.9 cm mass in the medial left parotid gland, which could represent a primary parotid neoplasm or an enlarged intraparotid lymph node. ENT consultation recommended. 5. Aortic atherosclerosis. Aortic Atherosclerosis (ICD10-I70.0). Electronically Signed   By: Wiliam Ke M.D.   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  Again reviewed the potential signs and symptoms of CLL/need for treatment.  Patient is asymptomatic.  Continue surveillance every 6 months. Will order IGVH  # Hyponatremia: sodium 132- stable over 1 year Discussed with Dr. Maryjane Hurter [[email or web address]].  # Incidental CT July 2024- [PCP; pre-syncope]1.9 cm mass in the medial left parotid gland, which could represent a primary parotid neoplasm or an enlarged intraparotid lymph node. ENT consultation pending.   # Cancer screening: [place]- in 2021; none; PSA screening; skin cancer- referral to [place] dermatology- skin cancer surveillance/ Hx of CLL  *IG[facility] # DISPOSITION: #  referral to [place] dermatology- skin cancer surveillaince/ Hx of CLL # Follow up MD- in 6 months; labs- cbc/cmp/LDH; PSA-Dr.B                   All questions were answered. The patient knows to call the clinic with any problems, questions or concerns.    Earna Coder, MD 02/05/2023 10:53 AM

## 2023-02-05 NOTE — Addendum Note (Signed)
Addended by: Darrold Span A on: 02/05/2023 11:18 AM   Modules accepted: Orders

## 2023-02-05 NOTE — Progress Notes (Signed)
Patient states that he had a scan done on the back of his neck and wants to know if the provider has the results back

## 2023-02-25 NOTE — Progress Notes (Signed)
Oley Balm, MD sent to [facility] PROCEDURE / BIOPSY REVIEW Date: 02/25/23  Requested Biopsy site: L parotid Reason for request: Mass in the medial left parotid gland measures up to 1.9 x 1.4 x 1.7 cm (AP x TR x CC) (series 11, image 110 and series 12, image 309). Imaging review: Best seen on CT 01/14/23  Decision: Approved Imaging modality to perform: Ultrasound Schedule with: Patient preference (Local vs Mod Sed) Schedule for: Any VIR  Additional comments: @VIR : attn overlying artery consider FNA only  Please contact me with questions, concerns, or if issue pertaining to this request arise.  Dayne Oley Balm, MD [facility]

## 2023-02-26 ENCOUNTER — Encounter: Payer: Self-pay | Admitting: Otolaryngology

## 2023-02-27 ENCOUNTER — Other Ambulatory Visit: Payer: Self-pay | Admitting: Otolaryngology

## 2023-02-27 DIAGNOSIS — K118 Other diseases of salivary glands: Secondary | ICD-10-CM

## 2023-03-05 NOTE — Progress Notes (Signed)
Patient for US guided FNA/Core Parotid Gland Biopsy on Friday 03/06/2023, I called and LVM for the patient on the phone and gave pre-procedure instructions. VM made the pt aware to be here at 12:30p and check in at the [facility] Called 03/05/2023

## 2023-03-06 ENCOUNTER — Ambulatory Visit: Admission: RE | Admit: 2023-03-06 | Payer: Medicare Other | Source: Ambulatory Visit

## 2023-03-06 DIAGNOSIS — K118 Other diseases of salivary glands: Secondary | ICD-10-CM | POA: Diagnosis present

## 2023-03-06 DIAGNOSIS — D110 Benign neoplasm of parotid gland: Secondary | ICD-10-CM | POA: Diagnosis not present

## 2023-03-06 MED ORDER — LIDOCAINE HCL (PF) 1 % IJ SOLN
10.0000 mL | Freq: Once | INTRAMUSCULAR | Status: AC
  Administered 2023-03-06: 10 mL via INTRADERMAL
  Filled 2023-03-06: qty 10

## 2023-03-08 ENCOUNTER — Emergency Department: Payer: Medicare Other

## 2023-03-08 ENCOUNTER — Other Ambulatory Visit: Payer: Self-pay

## 2023-03-08 ENCOUNTER — Inpatient Hospital Stay
Admission: EM | Admit: 2023-03-08 | Discharge: 2023-03-20 | DRG: 853 | Disposition: A | Discharge status: Skilled Nursing Facility | Payer: Medicare Other | Attending: Student | Admitting: Student

## 2023-03-08 DIAGNOSIS — E669 Obesity, unspecified: Secondary | ICD-10-CM | POA: Diagnosis present

## 2023-03-08 DIAGNOSIS — Z1152 Encounter for screening for COVID-19: Secondary | ICD-10-CM | POA: Diagnosis not present

## 2023-03-08 DIAGNOSIS — E785 Hyperlipidemia, unspecified: Secondary | ICD-10-CM | POA: Diagnosis present

## 2023-03-08 DIAGNOSIS — Z6835 Body mass index (BMI) 35.0-35.9, adult: Secondary | ICD-10-CM

## 2023-03-08 DIAGNOSIS — E538 Deficiency of other specified B group vitamins: Secondary | ICD-10-CM | POA: Diagnosis present

## 2023-03-08 DIAGNOSIS — C9110 Chronic lymphocytic leukemia of B-cell type not having achieved remission: Secondary | ICD-10-CM | POA: Diagnosis present

## 2023-03-08 DIAGNOSIS — A419 Sepsis, unspecified organism: Secondary | ICD-10-CM | POA: Diagnosis present

## 2023-03-08 DIAGNOSIS — X58XXXA Exposure to other specified factors, initial encounter: Secondary | ICD-10-CM | POA: Diagnosis present

## 2023-03-08 DIAGNOSIS — E861 Hypovolemia: Secondary | ICD-10-CM | POA: Diagnosis present

## 2023-03-08 DIAGNOSIS — J9811 Atelectasis: Secondary | ICD-10-CM | POA: Diagnosis not present

## 2023-03-08 DIAGNOSIS — E872 Acidosis, unspecified: Secondary | ICD-10-CM | POA: Diagnosis present

## 2023-03-08 DIAGNOSIS — R0609 Other forms of dyspnea: Secondary | ICD-10-CM | POA: Diagnosis not present

## 2023-03-08 DIAGNOSIS — I2510 Atherosclerotic heart disease of native coronary artery without angina pectoris: Secondary | ICD-10-CM | POA: Diagnosis present

## 2023-03-08 DIAGNOSIS — S42302A Unspecified fracture of shaft of humerus, left arm, initial encounter for closed fracture: Secondary | ICD-10-CM | POA: Diagnosis not present

## 2023-03-08 DIAGNOSIS — N390 Urinary tract infection, site not specified: Secondary | ICD-10-CM | POA: Diagnosis present

## 2023-03-08 DIAGNOSIS — I10 Essential (primary) hypertension: Secondary | ICD-10-CM | POA: Diagnosis present

## 2023-03-08 DIAGNOSIS — S42241A 4-part fracture of surgical neck of right humerus, initial encounter for closed fracture: Secondary | ICD-10-CM | POA: Diagnosis present

## 2023-03-08 DIAGNOSIS — E871 Hypo-osmolality and hyponatremia: Secondary | ICD-10-CM | POA: Diagnosis present

## 2023-03-08 DIAGNOSIS — S42242A 4-part fracture of surgical neck of left humerus, initial encounter for closed fracture: Secondary | ICD-10-CM | POA: Diagnosis present

## 2023-03-08 DIAGNOSIS — J441 Chronic obstructive pulmonary disease with (acute) exacerbation: Secondary | ICD-10-CM | POA: Diagnosis present

## 2023-03-08 DIAGNOSIS — D75839 Thrombocytosis, unspecified: Secondary | ICD-10-CM | POA: Diagnosis present

## 2023-03-08 DIAGNOSIS — G9341 Metabolic encephalopathy: Secondary | ICD-10-CM | POA: Diagnosis present

## 2023-03-08 DIAGNOSIS — R569 Unspecified convulsions: Secondary | ICD-10-CM | POA: Diagnosis present

## 2023-03-08 DIAGNOSIS — J690 Pneumonitis due to inhalation of food and vomit: Secondary | ICD-10-CM | POA: Diagnosis present

## 2023-03-08 DIAGNOSIS — J189 Pneumonia, unspecified organism: Secondary | ICD-10-CM | POA: Diagnosis not present

## 2023-03-08 DIAGNOSIS — E119 Type 2 diabetes mellitus without complications: Secondary | ICD-10-CM | POA: Diagnosis present

## 2023-03-08 DIAGNOSIS — N400 Enlarged prostate without lower urinary tract symptoms: Secondary | ICD-10-CM | POA: Diagnosis present

## 2023-03-08 DIAGNOSIS — J9601 Acute respiratory failure with hypoxia: Secondary | ICD-10-CM | POA: Diagnosis present

## 2023-03-08 DIAGNOSIS — R6521 Severe sepsis with septic shock: Secondary | ICD-10-CM | POA: Diagnosis present

## 2023-03-08 DIAGNOSIS — Z79899 Other long term (current) drug therapy: Secondary | ICD-10-CM

## 2023-03-08 DIAGNOSIS — E039 Hypothyroidism, unspecified: Secondary | ICD-10-CM | POA: Diagnosis present

## 2023-03-08 DIAGNOSIS — R509 Fever, unspecified: Secondary | ICD-10-CM

## 2023-03-08 DIAGNOSIS — S42301A Unspecified fracture of shaft of humerus, right arm, initial encounter for closed fracture: Secondary | ICD-10-CM | POA: Diagnosis not present

## 2023-03-08 DIAGNOSIS — Y929 Unspecified place or not applicable: Secondary | ICD-10-CM | POA: Diagnosis not present

## 2023-03-08 DIAGNOSIS — Z79890 Hormone replacement therapy (postmenopausal): Secondary | ICD-10-CM

## 2023-03-08 DIAGNOSIS — J180 Bronchopneumonia, unspecified organism: Secondary | ICD-10-CM

## 2023-03-08 DIAGNOSIS — R6520 Severe sepsis without septic shock: Secondary | ICD-10-CM | POA: Diagnosis not present

## 2023-03-08 DIAGNOSIS — Z7984 Long term (current) use of oral hypoglycemic drugs: Secondary | ICD-10-CM

## 2023-03-08 HISTORY — DX: Hypothyroidism, unspecified: E03.9

## 2023-03-08 HISTORY — DX: Urinary tract infection, site not specified: N39.0

## 2023-03-08 HISTORY — DX: Type 2 diabetes mellitus without complications: E11.9

## 2023-03-08 LAB — CBC WITH DIFFERENTIAL/PLATELET
Abs Immature Granulocytes: 0.32 10*3/uL — ABNORMAL HIGH (ref 0.00–0.07)
Basophils Absolute: 0.1 10*3/uL (ref 0.0–0.1)
Basophils Relative: 0 %
Eosinophils Absolute: 0.1 10*3/uL (ref 0.0–0.5)
Eosinophils Relative: 0 %
HCT: 36.5 % — ABNORMAL LOW (ref 39.0–52.0)
Hemoglobin: 12.1 g/dL — ABNORMAL LOW (ref 13.0–17.0)
Immature Granulocytes: 1 %
Lymphocytes Relative: 37 %
Lymphs Abs: 13 10*3/uL — ABNORMAL HIGH (ref 0.7–4.0)
MCH: 29.9 pg (ref 26.0–34.0)
MCHC: 33.2 g/dL (ref 30.0–36.0)
MCV: 90.1 fL (ref 80.0–100.0)
Monocytes Absolute: 1.9 10*3/uL — ABNORMAL HIGH (ref 0.1–1.0)
Monocytes Relative: 5 %
Neutro Abs: 19.8 10*3/uL — ABNORMAL HIGH (ref 1.7–7.7)
Neutrophils Relative %: 57 %
Platelets: 218 10*3/uL (ref 150–400)
RBC: 4.05 MIL/uL — ABNORMAL LOW (ref 4.22–5.81)
RDW: 15.1 % (ref 11.5–15.5)
WBC: 35.1 10*3/uL — ABNORMAL HIGH (ref 4.0–10.5)
nRBC: 0 % (ref 0.0–0.2)

## 2023-03-08 LAB — RESP PANEL BY RT-PCR (RSV, FLU A&B, COVID)  RVPGX2
Influenza A by PCR: NEGATIVE
Influenza B by PCR: NEGATIVE
Resp Syncytial Virus by PCR: NEGATIVE
SARS Coronavirus 2 by RT PCR: NEGATIVE

## 2023-03-08 LAB — BASIC METABOLIC PANEL
Anion gap: 7 (ref 5–15)
BUN: 13 mg/dL (ref 8–23)
CO2: 18 mmol/L — ABNORMAL LOW (ref 22–32)
Calcium: 8.1 mg/dL — ABNORMAL LOW (ref 8.9–10.3)
Chloride: 93 mmol/L — ABNORMAL LOW (ref 98–111)
Creatinine, Ser: 0.85 mg/dL (ref 0.61–1.24)
GFR, Estimated: >60 mL/min (ref >60)
Glucose, Bld: 184 mg/dL — ABNORMAL HIGH (ref 70–99)
Potassium: 4.1 mmol/L (ref 3.5–5.1)
Sodium: 118 mmol/L — CL (ref 135–145)

## 2023-03-08 LAB — URINALYSIS, W/ REFLEX TO CULTURE (INFECTION SUSPECTED)
Bacteria, UA: NONE SEEN
Bilirubin Urine: NEGATIVE
Glucose, UA: 150 mg/dL — AB
Ketones, ur: 20 mg/dL — AB
Nitrite: NEGATIVE
Protein, ur: 30 mg/dL — AB
RBC / HPF: >50 RBC/hpf (ref 0–5)
Specific Gravity, Urine: >1.046 — ABNORMAL HIGH (ref 1.005–1.030)
Squamous Epithelial / HPF: NONE SEEN /HPF (ref 0–5)
WBC, UA: >50 WBC/hpf (ref 0–5)
pH: 5 (ref 5.0–8.0)

## 2023-03-08 LAB — CBG MONITORING, ED: Glucose-Capillary: 164 mg/dL — ABNORMAL HIGH (ref 70–99)

## 2023-03-08 LAB — OSMOLALITY
Osmolality: 259 mosm/kg — ABNORMAL LOW (ref 275–295)
Osmolality: 257 mosm/kg — ABNORMAL LOW (ref 275–295)

## 2023-03-08 LAB — HEMOGLOBIN A1C
Hgb A1c MFr Bld: 6.7 % — ABNORMAL HIGH (ref 4.8–5.6)
Mean Plasma Glucose: 145.59 mg/dL

## 2023-03-08 LAB — SODIUM
Sodium: 120 mmol/L — ABNORMAL LOW (ref 135–145)
Sodium: 120 mmol/L — ABNORMAL LOW (ref 135–145)
Sodium: 120 mmol/L — ABNORMAL LOW (ref 135–145)

## 2023-03-08 LAB — TROPONIN I (HIGH SENSITIVITY)
Troponin I (High Sensitivity): 64 ng/L — ABNORMAL HIGH (ref <18)
Troponin I (High Sensitivity): 99 ng/L — ABNORMAL HIGH (ref <18)
Troponin I (High Sensitivity): 84 ng/L — ABNORMAL HIGH (ref <18)

## 2023-03-08 LAB — BRAIN NATRIURETIC PEPTIDE: B Natriuretic Peptide: 225.5 pg/mL — ABNORMAL HIGH (ref 0.0–100.0)

## 2023-03-08 LAB — COMPREHENSIVE METABOLIC PANEL
ALT: 46 U/L — ABNORMAL HIGH (ref 0–44)
AST: 67 U/L — ABNORMAL HIGH (ref 15–41)
Albumin: 3.4 g/dL — ABNORMAL LOW (ref 3.5–5.0)
Alkaline Phosphatase: 43 U/L (ref 38–126)
Anion gap: 14 (ref 5–15)
BUN: 17 mg/dL (ref 8–23)
CO2: 12 mmol/L — ABNORMAL LOW (ref 22–32)
Calcium: 8.3 mg/dL — ABNORMAL LOW (ref 8.9–10.3)
Chloride: 92 mmol/L — ABNORMAL LOW (ref 98–111)
Creatinine, Ser: 1.13 mg/dL (ref 0.61–1.24)
GFR, Estimated: >60 mL/min (ref >60)
Glucose, Bld: 201 mg/dL — ABNORMAL HIGH (ref 70–99)
Potassium: 4.1 mmol/L (ref 3.5–5.1)
Sodium: 118 mmol/L — CL (ref 135–145)
Total Bilirubin: 2.1 mg/dL — ABNORMAL HIGH (ref 0.3–1.2)
Total Protein: 6.2 g/dL — ABNORMAL LOW (ref 6.5–8.1)

## 2023-03-08 LAB — LACTIC ACID, PLASMA
Lactic Acid, Venous: 6.5 mmol/L (ref 0.5–1.9)
Lactic Acid, Venous: 3.9 mmol/L (ref 0.5–1.9)
Lactic Acid, Venous: 1.9 mmol/L (ref 0.5–1.9)
Lactic Acid, Venous: 2.1 mmol/L (ref 0.5–1.9)

## 2023-03-08 LAB — SODIUM, URINE, RANDOM
Sodium, Ur: 51 mmol/L
Sodium, Ur: 12 mmol/L

## 2023-03-08 LAB — GLUCOSE, CAPILLARY
Glucose-Capillary: 171 mg/dL — ABNORMAL HIGH (ref 70–99)
Glucose-Capillary: 141 mg/dL — ABNORMAL HIGH (ref 70–99)
Glucose-Capillary: 159 mg/dL — ABNORMAL HIGH (ref 70–99)

## 2023-03-08 LAB — CK: Total CK: 623 U/L — ABNORMAL HIGH (ref 49–397)

## 2023-03-08 LAB — OSMOLALITY, URINE
Osmolality, Ur: 574 mosm/kg (ref 300–900)
Osmolality, Ur: 675 mosm/kg (ref 300–900)

## 2023-03-08 LAB — MRSA NEXT GEN BY PCR, NASAL: MRSA by PCR Next Gen: NOT DETECTED

## 2023-03-08 LAB — TSH: TSH: 2.679 u[IU]/mL (ref 0.350–4.500)

## 2023-03-08 MED ORDER — MORPHINE SULFATE (PF) 4 MG/ML IV SOLN
4.0000 mg | Freq: Once | INTRAVENOUS | Status: AC
  Administered 2023-03-08: 4 mg via INTRAVENOUS
  Filled 2023-03-08: qty 1

## 2023-03-08 MED ORDER — SODIUM CHLORIDE 0.9 % IV SOLN: INTRAVENOUS | Status: DC

## 2023-03-08 MED ORDER — FENTANYL CITRATE PF 50 MCG/ML IJ SOSY
50.0000 ug | PREFILLED_SYRINGE | Freq: Once | INTRAMUSCULAR | Status: AC
  Administered 2023-03-08: 50 ug via INTRAVENOUS
  Filled 2023-03-08: qty 1

## 2023-03-08 MED ORDER — ENOXAPARIN SODIUM 40 MG/0.4ML IJ SOSY: 40.0000 mg | PREFILLED_SYRINGE | INTRAMUSCULAR | Status: DC

## 2023-03-08 MED ORDER — AZITHROMYCIN 250 MG PO TABS
500.0000 mg | ORAL_TABLET | Freq: Every day | ORAL | Status: AC
  Administered 2023-03-09 – 2023-03-12 (×4): 500 mg via ORAL
  Filled 2023-03-08 (×4): qty 2

## 2023-03-08 MED ORDER — IOHEXOL 300 MG/ML  SOLN
75.0000 mL | Freq: Once | INTRAMUSCULAR | Status: AC | PRN
  Administered 2023-03-08: 75 mL via INTRAVENOUS

## 2023-03-08 MED ORDER — LACTATED RINGERS IV BOLUS (SEPSIS)
1000.0000 mL | Freq: Once | INTRAVENOUS | Status: AC
  Administered 2023-03-08: 1000 mL via INTRAVENOUS

## 2023-03-08 MED ORDER — SODIUM CHLORIDE 0.9 % IV SOLN
2.0000 g | INTRAVENOUS | Status: DC
  Administered 2023-03-09 – 2023-03-10 (×2): 2 g via INTRAVENOUS
  Filled 2023-03-08 (×3): qty 20

## 2023-03-08 MED ORDER — INSULIN ASPART 100 UNIT/ML IJ SOLN
0.0000 [IU] | Freq: Three times a day (TID) | INTRAMUSCULAR | Status: DC
  Administered 2023-03-08: 4 [IU] via SUBCUTANEOUS
  Administered 2023-03-08: 3 [IU] via SUBCUTANEOUS
  Administered 2023-03-09: 4 [IU] via SUBCUTANEOUS
  Administered 2023-03-09 (×2): 3 [IU] via SUBCUTANEOUS
  Administered 2023-03-10 – 2023-03-11 (×4): 4 [IU] via SUBCUTANEOUS
  Administered 2023-03-11: 11 [IU] via SUBCUTANEOUS
  Filled 2023-03-08 (×10): qty 1

## 2023-03-08 MED ORDER — HYDROMORPHONE HCL 1 MG/ML IJ SOLN
0.5000 mg | INTRAMUSCULAR | Status: DC | PRN
  Administered 2023-03-08 (×3): 0.5 mg via INTRAVENOUS
  Filled 2023-03-08 (×3): qty 1

## 2023-03-08 MED ORDER — ACETAMINOPHEN 325 MG RE SUPP
975.0000 mg | Freq: Once | RECTAL | Status: AC
  Administered 2023-03-08: 975 mg via RECTAL
  Filled 2023-03-08: qty 3

## 2023-03-08 MED ORDER — ENOXAPARIN SODIUM 60 MG/0.6ML IJ SOSY
0.5000 mg/kg | PREFILLED_SYRINGE | INTRAMUSCULAR | Status: DC
  Administered 2023-03-08 – 2023-03-11 (×4): 50 mg via SUBCUTANEOUS
  Filled 2023-03-08 (×8): qty 0.6

## 2023-03-08 MED ORDER — ONDANSETRON HCL 4 MG/2ML IJ SOLN: 4.0000 mg | Freq: Four times a day (QID) | INTRAMUSCULAR | Status: DC | PRN

## 2023-03-08 MED ORDER — SODIUM CHLORIDE 3 % IV SOLN
INTRAVENOUS | Status: DC
  Filled 2023-03-08 (×5): qty 500

## 2023-03-08 MED ORDER — SODIUM CHLORIDE 0.9 % IV SOLN
2.0000 g | INTRAVENOUS | Status: DC
  Administered 2023-03-08: 2 g via INTRAVENOUS
  Filled 2023-03-08: qty 20

## 2023-03-08 MED ORDER — LEVOTHYROXINE SODIUM 50 MCG PO TABS
50.0000 ug | ORAL_TABLET | Freq: Every day | ORAL | Status: DC
  Administered 2023-03-09 – 2023-03-20 (×12): 50 ug via ORAL
  Filled 2023-03-08 (×12): qty 1

## 2023-03-08 MED ORDER — ACETAMINOPHEN 500 MG PO TABS: 1000.0000 mg | ORAL_TABLET | Freq: Once | ORAL | Status: DC

## 2023-03-08 MED ORDER — DICLOFENAC SODIUM 1 % EX GEL
2.0000 g | Freq: Four times a day (QID) | CUTANEOUS | Status: DC
  Administered 2023-03-08 – 2023-03-19 (×35): 2 g via TOPICAL
  Filled 2023-03-08 (×4): qty 100

## 2023-03-08 MED ORDER — CHLORHEXIDINE GLUCONATE CLOTH 2 % EX PADS
6.0000 | MEDICATED_PAD | Freq: Every day | CUTANEOUS | Status: DC
  Administered 2023-03-09 – 2023-03-20 (×12): 6 via TOPICAL

## 2023-03-08 MED ORDER — SODIUM CHLORIDE 0.9 % IV SOLN
500.0000 mg | INTRAVENOUS | Status: DC
  Administered 2023-03-08: 500 mg via INTRAVENOUS
  Filled 2023-03-08: qty 5

## 2023-03-08 MED ORDER — ONDANSETRON HCL 4 MG PO TABS: 4.0000 mg | ORAL_TABLET | Freq: Four times a day (QID) | ORAL | Status: DC | PRN

## 2023-03-08 MED ORDER — HYDRALAZINE HCL 20 MG/ML IJ SOLN: 5.0000 mg | Freq: Four times a day (QID) | INTRAMUSCULAR | Status: DC | PRN

## 2023-03-08 MED ORDER — VANCOMYCIN HCL IN DEXTROSE 1-5 GM/200ML-% IV SOLN
1000.0000 mg | Freq: Once | INTRAVENOUS | Status: AC
  Administered 2023-03-08: 1000 mg via INTRAVENOUS
  Filled 2023-03-08: qty 200

## 2023-03-08 NOTE — ED Notes (Signed)
Advised nurse that patient has ready bed 

## 2023-03-08 NOTE — Progress Notes (Signed)
PHARMACIST - PHYSICIAN COMMUNICATION  CONCERNING:  Enoxaparin (Lovenox) for DVT Prophylaxis    RECOMMENDATION: Patient was prescribed enoxaprin 40mg  q24 hours for VTE prophylaxis.   Filed Weights   03/08/23 0610  Weight: 99.8 kg (220 lb)    Body mass index is 31.57 kg/m.  Estimated Creatinine Clearance: 61.7 mL/min (by C-G formula based on SCr of 1.13 mg/dL).   Based on [facility] policy patient is candidate for enoxaparin 0.5mg /kg TBW SQ every 24 hours based on BMI being >30.   DESCRIPTION: Pharmacy has adjusted enoxaparin dose per [facility] policy.  Patient is now receiving enoxaparin 50 mg every 24 hours    Gardner Candle, PharmD, BCPS Clinical Pharmacist 03/08/2023 10:47 AM

## 2023-03-08 NOTE — ED Notes (Signed)
Patient transported to CT 

## 2023-03-08 NOTE — Consult Note (Signed)
Pharmacy Consulted for monitoring of Hypertonic Saline  Philip Robbins is a 82 year old M admited on 8/25 for UTI, hypoxia, hypotension and hyponatremia. Patient has a history of runny nose, sore throat and a strong burning sensation while urinating with dark colored urine. Also experienced generalized malaise. They were found this morning with large vomitus in bed with a foaming mouth. Some twitching movement was observed concerning for seizure. Likely hypovolemic secondary to repeated vomiting overnight and sepsis. Pharmacy was consulted for the monitoring of hypertonic saline.   MIVF:  NaCL 3% @25 /h (pending start 8/25 @ 1245) - per nephrology   Date/Time  Sodium Level  Rate of Infusion  08/25 @ 1110  118    08/25 @ 1353  120  3% saline started @ 25 mL/hr @ 1436  08/25 @ 1740  120  25 mL/hr               Goal of Therapy:  Goal of correction per 24 hours IF sodium rises > 4 mEq/L over 2 hours   > 6 mEq/L over 4 hours - Pharmacy will evaluate and contact MD per consult.  Plan:  Presenting with sodium of 118 vs unknown Baseline.  MIVF NS@150  stopped at 1400; NaCL 3% order active since 8/25 @ 1436 Continue hypertonic saline infusion at 25 mL/hr CTM labs q2h x2; then q4h thereafter.  Pharmacy will continue to monitor   Thank you for involving pharmacy in this patient's care.   Rockwell Alexandria, PharmD Clinical Pharmacist 03/08/2023 7:11 PM

## 2023-03-08 NOTE — ED Triage Notes (Addendum)
Pt BIB [facility] from home d/t agitation and AMS.  Pt was hypoxic at 85% on RA and placed on 15L NRB.  Pt is now less agitated but still altered.  Pt has fever of 101.8 Ax with EMS and has had recent Dx for UTI and on "Levaquin" for it.  Wife of pt thought he was having a seizure.

## 2023-03-08 NOTE — Progress Notes (Signed)
Upon initial neuro assessment to unit, Pt was noted to have R sided eye and mouth droop, per patient his eye was related from shingles he got. MD was notified, no new orders placed will continue to monitor.

## 2023-03-08 NOTE — Consult Note (Signed)
Pharmacy Consulted for monitoring of Hypertonic Saline  Philip Robbins is a 82 year old M admited on 8/25 for UTI, hypoxia, hypotension and hyponatremia. Patient has a history of runny nose, sore throat and a strong burning sensation while urinating with dark colored urine. Also experienced generalized malaise. They were found this morning with large vomitus in bed with a foaming mouth. Some twitching movement was observed concerning for seizure. Likely hypovolemic secondary to repeated vomiting overnight and sepsis. Pharmacy was consulted for the monitoring of hypertonic saline.   MIVF:  NaCL 3% @25 /h (pending start 8/25 @ 1245) - per nephrology   8/25 1110 Na 118 8/25 1353 Na 120 8/25 1436 3% saline started @ 14ml/hr   Goal of Therapy:  Goal of correction per 24 hours IF sodium rises > 4 mEq/L over 2 hours   > 6 mEq/L over 4 hours - Pharmacy will evaluate and contact MD per consult.  Plan:  Presenting with sodium of 118 vs unknown Baseline.  MIVF NS@150  stopped at 1400; NaCL 3% order active since 8/25   CTM labs q2h x2; then q4h thereafter.  Pharmacy will continue to monitor   Bari Mantis PharmD Clinical Pharmacist 03/08/2023

## 2023-03-08 NOTE — Consult Note (Signed)
Pharmacy Consulted for monitoring of Hypertonic Saline  Philip Robbins is a 82 year old M admited on 8/25 for UTI, hypoxia, hypotension and hyponatremia. Patient has a history of runny nose, sore throat and a strong burning sensation while urinating with dark colored urine. Also experienced generalized malaise. They were found this morning with large vomitus in bed with a foaming mouth. Some twitching movement was observed concerning for seizure. Likely hypovolemic secondary to repeated vomiting overnight and sepsis. Pharmacy was consulted for the monitoring of hypertonic saline.   MIVF:  NaCL 3% @25 /h (pending start 8/25 @ 1245) - per nephrology   Date/Time  Sodium Level  Rate of Infusion  08/25 @ 1110  118    08/25 @ 1353  120  3% saline started @ 25 mL/hr @ 1436  08/25 @ 1740  120  25 mL/hr  08/25 @ 2006  120            Goal of Therapy:  Goal of correction per 24 hours IF sodium rises > 4 mEq/L over 2 hours   > 6 mEq/L over 4 hours - Pharmacy will evaluate and contact MD per consult.  Plan:  Presenting with sodium of 118 vs unknown Baseline.  MIVF NS@150  stopped at 1400; NaCL 3% order active since 8/25 @ 1436 Continue hypertonic saline infusion at 25 mL/hr CTM labs q4 hours  Pharmacy will continue to monitor   Thank you for involving pharmacy in this patient's care.   Rockwell Alexandria, PharmD Clinical Pharmacist 03/08/2023 9:09 PM

## 2023-03-08 NOTE — Sepsis Progress Note (Signed)
Elink following code sepsis °

## 2023-03-08 NOTE — Consult Note (Signed)
[facility]  CONSULT NOTE    Date: 03/08/2023                  Patient Name:  MARVIN CUCINELLA  MRN: [id number]  DOB: 1941-07-12  Age / Sex: 82 y.o., male         PCP: Pcp, No                 Service Requesting Consult: Dr. Mikey College                 Reason for Consult: Hyponatremia            History of Present Illness: Mr. WESTYN SKOUSEN admitted to [facility] with urinary tract infection, hypoxia, hypotension and hyponatremia.   Patient went for a left neck lymph node biopsy on Friday. Afterwards, patient started to have upper respiratory infection symptoms. Then next day, he started having urinary infection symptoms. Was taken to urgent care where he was diagnosed with urinary tract infection and started on levofloxacin. Patient last night became hypoxic on room air, febrile and possible seizure.   Admitted to [facility] with pneumonia, sepsis, and urinary tract infection.   Found to have a serum sodium of 118 and nephrology consulted.   Wife and daughter at bedside who assist with history taking.    Medications: Outpatient medications: (Not in a hospital admission)   Current medications: Current Facility-Administered Medications  Medication Dose Route Frequency Provider Last Rate Last Admin   0.9 %  sodium chloride infusion   Intravenous Continuous Mikey College T, MD 150 mL/hr at 03/08/23 1111 New Bag at 03/08/23 1111   [START ON 03/09/2023] azithromycin (ZITHROMAX) tablet 500 mg  500 mg Oral Daily Emeline General, MD       [START ON 03/09/2023] cefTRIAXone (ROCEPHIN) 2 g in sodium chloride 0.9 % 100 mL IVPB  2 g Intravenous Q24H Mikey College T, MD       enoxaparin (LOVENOX) injection 50 mg  0.5 mg/kg Subcutaneous Q24H Hallaji, Sheema M, RPH       hydrALAZINE (APRESOLINE) injection 5 mg  5 mg Intravenous Q6H PRN Mikey College T, MD       HYDROmorphone (DILAUDID) injection 0.5 mg  0.5 mg Intravenous Q4H PRN Mikey College T, MD       insulin aspart (novoLOG) injection  0-20 Units  0-20 Units Subcutaneous TID WC Emeline General, MD       [START ON 03/09/2023] levothyroxine (SYNTHROID) tablet 50 mcg  50 mcg Oral Q0600 Mikey College T, MD       ondansetron [facility]) tablet 4 mg  4 mg Oral Q6H PRN Mikey College T, MD       Or   ondansetron [facility]) injection 4 mg  4 mg Intravenous Q6H PRN Emeline General, MD       Current Outpatient Medications  Medication Sig Dispense Refill   atorvastatin (LIPITOR) 40 MG tablet Take 40 mg by mouth daily.     fluticasone (FLONASE) 50 MCG/ACT nasal [facility] 2 sprays into both nostrils every evening.     levofloxacin (LEVAQUIN) 750 MG tablet Take 750 mg by mouth daily.     levothyroxine (SYNTHROID) 75 MCG tablet Take 75 mcg by mouth daily before breakfast.     lisinopril (ZESTRIL) 10 MG tablet Take 10 mg by mouth daily.     metFORMIN (GLUCOPHAGE) 1000 MG tablet Take 1,000 mg by mouth 2 (two) times daily with a meal.  pioglitazone (ACTOS) 45 MG tablet Take 45 mg by mouth daily.     sitaGLIPtin (JANUVIA) 50 MG tablet Take 50 mg by mouth daily.     tamsulosin (FLOMAX) 0.4 MG CAPS capsule Take 0.4 mg by mouth daily.        Allergies: No Known Allergies    Past Medical History: Past Medical History:  Diagnosis Date   UTI (urinary tract infection)      Past Surgical History:    Family History: No family history on file.   Social History: Social History   Socioeconomic History   Marital status: Married    Spouse name: Not on file   Number of children: Not on file   Years of education: Not on file   Highest education level: Not on file  Occupational History   Not on file  Tobacco Use   Smoking status: Not on file   Smokeless tobacco: Not on file  Substance and Sexual Activity   Alcohol use: Not on file   Drug use: Not on file   Sexual activity: Not on file  Other Topics Concern   Not on file  Social History Narrative   Not on file   Social Determinants of Health   Financial Resource Strain: Not on  file  Food Insecurity: Not on file  Transportation Needs: Not on file  Physical Activity: Not on file  Stress: Not on file  Social Connections: Not on file  Intimate Partner Violence: Not on file     Vital Signs: Blood pressure (!) 124/53, pulse 86, temperature 99.3 F (37.4 C), temperature source Oral, resp. rate (!) 30, height 5\' 10"  (1.778 m), weight 99.8 kg, SpO2 95%.  Weight trends: Filed Weights   03/08/23 0610  Weight: 99.8 kg    Physical Exam: General: NAD, laying in bed  Head: Normocephalic, atraumatic. Moist oral mucosal membranes  Eyes: Anicteric, PERRL  Neck: Supple, trachea midline  Lungs:  Clear to auscultation  Heart: Regular rate and rhythm  Abdomen:  Soft, nontender,   Extremities:  no peripheral edema.  Neurologic: Nonfocal, moving all four extremities  Skin: No lesions  Access: none     Lab results: Basic Metabolic Panel: Recent Labs  Lab 03/08/23 0620  NA 118*  K 4.1  CL 92*  CO2 12*  GLUCOSE 201*  BUN 17  CREATININE 1.13  CALCIUM 8.3*    Liver Function Tests: Recent Labs  Lab 03/08/23 0620  AST 67*  ALT 46*  ALKPHOS 43  BILITOT 2.1*  PROT 6.2*  ALBUMIN 3.4*   No results for input(s): "LIPASE", "AMYLASE" in the last 168 hours. No results for input(s): "AMMONIA" in the last 168 hours.  CBC: Recent Labs  Lab 03/08/23 0620  WBC 35.1*  NEUTROABS 19.8*  HGB 12.1*  HCT 36.5*  MCV 90.1  PLT 218    Cardiac Enzymes: No results for input(s): "CKTOTAL", "CKMB", "CKMBINDEX", "TROPONINI" in the last 168 hours.  BNP: Invalid input(s): "POCBNP"  CBG: No results for input(s): "GLUCAP" in the last 168 hours.  Microbiology: Results for orders placed or performed during the hospital encounter of 03/08/23  Resp panel by RT-PCR (RSV, Flu A&B, Covid) Anterior Nasal Swab     Status: None   Collection Time: 03/08/23  6:28 AM   Specimen: Anterior Nasal Swab  Result Value Ref Range Status   SARS Coronavirus 2 by RT PCR NEGATIVE  NEGATIVE Final    Comment: (NOTE) SARS-CoV-2 target nucleic acids are NOT DETECTED.  The SARS-CoV-2  RNA is generally detectable in upper respiratory specimens during the acute phase of infection. The lowest concentration of SARS-CoV-2 viral copies this assay can detect is 138 copies/mL. A negative result does not preclude SARS-Cov-2 infection and should not be used as the sole basis for treatment or other patient management decisions. A negative result may occur with  improper specimen collection/handling, submission of specimen other than nasopharyngeal swab, presence of viral mutation(s) within the areas targeted by this assay, and inadequate number of viral copies(<138 copies/mL). A negative result must be combined with clinical observations, patient history, and epidemiological information. The expected result is Negative.  Fact Sheet for Patients:  BloggerCourse.com  Fact Sheet for Healthcare Providers:  SeriousBroker.it  This test is no t yet approved or cleared by the [place] FDA and  has been authorized for detection and/or diagnosis of SARS-CoV-2 by FDA under an Emergency Use Authorization (EUA). This EUA will remain  in effect (meaning this test can be used) for the duration of the COVID-19 declaration under Section 564(b)(1) of the Act, 21 U.S.C.section 360bbb-3(b)(1), unless the authorization is terminated  or revoked sooner.       Influenza A by PCR NEGATIVE NEGATIVE Final   Influenza B by PCR NEGATIVE NEGATIVE Final    Comment: (NOTE) The Xpert Xpress SARS-CoV-2/FLU/RSV plus assay is intended as an aid in the diagnosis of influenza from Nasopharyngeal swab specimens and should not be used as a sole basis for treatment. Nasal washings and aspirates are unacceptable for Xpert Xpress SARS-CoV-2/FLU/RSV testing.  Fact Sheet for Patients: BloggerCourse.com  Fact Sheet for Healthcare  Providers: SeriousBroker.it  This test is not yet approved or cleared by the [place] FDA and has been authorized for detection and/or diagnosis of SARS-CoV-2 by FDA under an Emergency Use Authorization (EUA). This EUA will remain in effect (meaning this test can be used) for the duration of the COVID-19 declaration under Section 564(b)(1) of the Act, 21 U.S.C. section 360bbb-3(b)(1), unless the authorization is terminated or revoked.     Resp Syncytial Virus by PCR NEGATIVE NEGATIVE Final    Comment: (NOTE) Fact Sheet for Patients: BloggerCourse.com  Fact Sheet for Healthcare Providers: SeriousBroker.it  This test is not yet approved or cleared by the [place] FDA and has been authorized for detection and/or diagnosis of SARS-CoV-2 by FDA under an Emergency Use Authorization (EUA). This EUA will remain in effect (meaning this test can be used) for the duration of the COVID-19 declaration under Section 564(b)(1) of the Act, 21 U.S.C. section 360bbb-3(b)(1), unless the authorization is terminated or revoked.  Performed at [facility], [address] [place], [place] [address]     Coagulation Studies: No results for input(s): "LABPROT", "INR" in the last 72 hours.  Urinalysis: Recent Labs    03/08/23 0955  COLORURINE YELLOW*  LABSPEC >1.046*  PHURINE 5.0  GLUCOSEU 150*  HGBUR LARGE*  BILIRUBINUR NEGATIVE  KETONESUR 20*  PROTEINUR 30*  NITRITE NEGATIVE  LEUKOCYTESUR LARGE*      Imaging: DG Shoulder Left  Result Date: 03/08/2023 CLINICAL DATA:  Left shoulder pain. EXAM: LEFT SHOULDER - 2+ VIEW COMPARISON:  None Available. FINDINGS: A comminuted fracture is seen involving the left humeral head and neck. The bilateral view shows posterior subluxation of the humeral head, with anterior displacement of the humeral neck. IMPRESSION: Comminuted fracture of left humeral head  and neck. Posterior subluxation of the humeral head, with anterior displacement of the humeral neck. Electronically Signed   By: Danae Orleans  M.D.   On: 03/08/2023 11:41   DG Shoulder Right  Result Date: 03/08/2023 CLINICAL DATA:  Right shoulder pain. EXAM: RIGHT SHOULDER - 2+ VIEW COMPARISON:  None Available. FINDINGS: A mildly displaced, comminuted fracture is seen involving the right humeral head and neck. No evidence of dislocation. IMPRESSION: Mildly displaced, comminuted fracture of the right humeral head and neck. Electronically Signed   By: Danae Orleans M.D.   On: 03/08/2023 11:39   CT Head Wo Contrast  Result Date: 03/08/2023 CLINICAL DATA:  Hypoxia and fever.  Mental status change. EXAM: CT HEAD WITHOUT CONTRAST CT MAXILLOFACIAL WITHOUT CONTRAST TECHNIQUE: Multidetector CT imaging of the head and maxillofacial structures were performed using the standard protocol without intravenous contrast. Multiplanar CT image reconstructions of the maxillofacial structures were also generated. RADIATION DOSE REDUCTION: This exam was performed according to the departmental dose-optimization program which includes automated exposure control, adjustment of the mA and/or kV according to patient size and/or use of iterative reconstruction technique. COMPARISON:  None Available. FINDINGS: CT HEAD FINDINGS Brain: No evidence of acute infarction, hemorrhage, hydrocephalus, extra-axial collection or mass lesion/mass effect. Generalized atrophy. Vascular: No hyperdense vessel or unexpected calcification. Skull: Normal. Negative for fracture or focal lesion. CT MAXILLOFACIAL FINDINGS Osseous: No fracture or mandibular dislocation. No destructive process. Orbits: Negative. No traumatic or inflammatory finding. Sinuses: Prior endoscopic sinus surgery.  No hemosinus. Soft tissues: No acute finding IMPRESSION: 1. No acute finding or specific cause for symptoms. 2. Generalized brain atrophy. Electronically Signed   By:  Tiburcio Pea M.D.   On: 03/08/2023 08:34   CT Maxillofacial W Contrast  Result Date: 03/08/2023 CLINICAL DATA:  Hypoxia and fever.  Mental status change. EXAM: CT HEAD WITHOUT CONTRAST CT MAXILLOFACIAL WITHOUT CONTRAST TECHNIQUE: Multidetector CT imaging of the head and maxillofacial structures were performed using the standard protocol without intravenous contrast. Multiplanar CT image reconstructions of the maxillofacial structures were also generated. RADIATION DOSE REDUCTION: This exam was performed according to the departmental dose-optimization program which includes automated exposure control, adjustment of the mA and/or kV according to patient size and/or use of iterative reconstruction technique. COMPARISON:  None Available. FINDINGS: CT HEAD FINDINGS Brain: No evidence of acute infarction, hemorrhage, hydrocephalus, extra-axial collection or mass lesion/mass effect. Generalized atrophy. Vascular: No hyperdense vessel or unexpected calcification. Skull: Normal. Negative for fracture or focal lesion. CT MAXILLOFACIAL FINDINGS Osseous: No fracture or mandibular dislocation. No destructive process. Orbits: Negative. No traumatic or inflammatory finding. Sinuses: Prior endoscopic sinus surgery.  No hemosinus. Soft tissues: No acute finding IMPRESSION: 1. No acute finding or specific cause for symptoms. 2. Generalized brain atrophy. Electronically Signed   By: Tiburcio Pea M.D.   On: 03/08/2023 08:34   DG Chest Port 1 View  Result Date: 03/08/2023 CLINICAL DATA:  82 year old male with history of shortness of breath. EXAM: PORTABLE CHEST 1 VIEW COMPARISON:  No priors. FINDINGS: Lung volumes are low. Diffuse interstitial prominence and peribronchial cuffing concerning for an acute bronchitis. Patchy ill-defined opacities also noted throughout the mid to lower lungs bilaterally, which may suggest developing multilobar bilateral bronchopneumonia. No definite pleural effusions. No pneumothorax. No  evidence of pulmonary edema. Heart size is normal. Upper mediastinal contours are distorted by patient positioning. Atherosclerotic calcifications in the thoracic aorta. IMPRESSION: 1. The appearance the chest is concerning for bronchitis likely with developing multilobar bilateral bronchopneumonia, as above. 2. Aortic atherosclerosis. Electronically Signed   By: Trudie Reed M.D.   On: 03/08/2023 06:37     Assessment & Plan: Mr.  MASAHARU WOLBERT is a 82 y.o.  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 Hypo-osmolar. Hypovolemic to euvolemic on examination. With neurologic symptoms. No improvement after 3 liters of LR infusion.  - start hypertonic saline infusion.    Urinary tract infection with septic shock and hypotension - holding lisinopril  - empiric antibiotic.   Diabetes mellitus type II with renal manifestations - holding metformin - continue glucose control.   LOS: 0 Inaya Gillham 8/25/202412:17 PM

## 2023-03-08 NOTE — Progress Notes (Signed)
Repeat sodium 118, discussed with on-call nephrology, starting 3% hypertonic saline upgrade to stepdown unit.

## 2023-03-08 NOTE — ED Notes (Signed)
RN assisted pt with urinal.

## 2023-03-08 NOTE — ED Notes (Signed)
Critical troponin 99 reported to EDP

## 2023-03-08 NOTE — Consult Note (Addendum)
Pharmacy Consulted for monitoring of Hypertonic Saline  Philip Robbins is a 82 year old M admited on 8/25 for UTI, hypoxia, hypotension and hyponatremia. Patient has a history of runny nose, sore throat and a strong burning sensation while urinating with dark colored urine. Also experienced generalized malaise. They were found this morning with large vomitus in bed with a foaming mouth. Some twitching movement was observed concerning for seizure. Likely hypovolemic secondary to repeated vomiting overnight and sepsis. Pharmacy was consulted for the monitoring of hypertonic saline.   MIVF:  NaCL 3% @25 /h (pending start 8/25 @ 1245) - per nephrology   Goal of Therapy:  Goal of correction per 24 hours IF sodium rises > 4 mEq/L over 2 hours   > 6 mEq/L over 4 hours - Pharmacy will evaluate and contact MD per consult.  Plan:  Presenting with sodium of 118 vs unknown Baseline.  MIVF NS@150  stopped at 1400; NaCL 3% order active since 8/25   CTM labs q2h x2; then q4h thereafter.  Pharmacy will continue to monitor   Gara Kroner PGY1 Pharmacy Resident 03/08/2023 13:17

## 2023-03-08 NOTE — ED Provider Notes (Signed)
[facility] Provider Note    Event Date/Time   First MD Initiated Contact with Patient 03/08/23 [phone]     (approximate)   History   Fever   HPI  Philip Robbins is a 82 y.o. male brought to the ED by EMS from home with a chief complaint of altered mental status.  Patient currently taking Levaquin for UTI.  Wife dated to EMS she thought he was having a seizure.  No history of seizure disorder.  EMS reports room air saturation 85%, placed on nonrebreather oxygen with improvement.  Patient endorses cough.  Denies chest pain, abdominal pain, nausea, vomiting or dizziness.     Past Medical History   Past Medical History:  Diagnosis Date   UTI (urinary tract infection)      Active Problem List  There are no problems to display for this patient.    Past Surgical History  See chart   Home Medications   Prior to Admission medications   Not on File     Allergies  Patient has no known allergies.   Family History  No family history on file.   Physical Exam  Triage Vital Signs: ED Triage Vitals [03/08/23 0601]  Encounter Vitals Group     BP      Systolic BP Percentile      Diastolic BP Percentile      Pulse      Resp      Temp      Temp src      SpO2 (S) 100 %     Weight      Height      Head Circumference      Peak Flow      Pain Score      Pain Loc      Pain Education      Exclude from Growth Chart     Updated Vital Signs: BP (!) 101/40   Pulse (!) 110   Temp (!) 101.2 F (38.4 C) (Axillary)   Resp (!) 30   Ht 5\' 10"  (1.778 m)   Wt 99.8 kg   SpO2 91%   BMI 31.57 kg/m    General: Awake, mild distress.  CV:  Tachycardic.  Good peripheral perfusion.  Resp:  Increased effort.  Scattered rhonchi.  Loose, rattling cough. Abd:  Nontender.  No distention.  Other:  Left face status post skin biopsy with surrounding induration and swelling.  No neck swelling.  No petechiae.  Alert and oriented to person and place.  CN II-XII  roughly intact.  5/5 motor strength and sensation all extremities.  M AE x 4.   ED Results / Procedures / Treatments  Labs (all labs ordered are listed, but only abnormal results are displayed) Labs Reviewed  CBC WITH DIFFERENTIAL/PLATELET - Abnormal; Notable for the following components:      Result Value   WBC 35.1 (*)    RBC 4.05 (*)    Hemoglobin 12.1 (*)    HCT 36.5 (*)    All other components within normal limits  CULTURE, BLOOD (ROUTINE X 2)  CULTURE, BLOOD (ROUTINE X 2)  RESP PANEL BY RT-PCR (RSV, FLU A&B, COVID)  [id number]  COMPREHENSIVE METABOLIC PANEL  BRAIN NATRIURETIC PEPTIDE  URINALYSIS, W/ REFLEX TO CULTURE (INFECTION SUSPECTED)  LACTIC ACID, PLASMA  LACTIC ACID, PLASMA  TROPONIN I (HIGH SENSITIVITY)     EKG  ED ECG REPORT I, Eddi Hymes J, the attending physician, personally viewed and interpreted this ECG.  Date: 03/08/2023  EKG Time: 0606  Rate: 113  Rhythm: sinus tachycardia  Axis: LAD  Intervals:left anterior fascicular block  ST&T Change: Nonspecific    RADIOLOGY I have independently visualized and interpreted patient's imaging studies as well as noted the radiology interpretation:  Chest x-ray: Bronchopneumonia  CT head/maxillofacial: Pending  Official radiology report(s): DG Chest Port 1 View  Result Date: 03/08/2023 CLINICAL DATA:  82 year old male with history of shortness of breath. EXAM: PORTABLE CHEST 1 VIEW COMPARISON:  No priors. FINDINGS: Lung volumes are low. Diffuse interstitial prominence and peribronchial cuffing concerning for an acute bronchitis. Patchy ill-defined opacities also noted throughout the mid to lower lungs bilaterally, which may suggest developing multilobar bilateral bronchopneumonia. No definite pleural effusions. No pneumothorax. No evidence of pulmonary edema. Heart size is normal. Upper mediastinal contours are distorted by patient positioning. Atherosclerotic calcifications in the thoracic aorta. IMPRESSION: 1. The  appearance the chest is concerning for bronchitis likely with developing multilobar bilateral bronchopneumonia, as above. 2. Aortic atherosclerosis. Electronically Signed   By: Trudie Reed M.D.   On: 03/08/2023 06:37     PROCEDURES:  Critical Care performed: Yes, see critical care procedure note(s)  CRITICAL CARE Performed by: Irean Hong   Total critical care time: 30 minutes  Critical care time was exclusive of separately billable procedures and treating other patients.  Critical care was necessary to treat or prevent imminent or life-threatening deterioration.  Critical care was time spent personally by me on the following activities: development of treatment plan with patient and/or surrogate as well as nursing, discussions with consultants, evaluation of patient's response to treatment, examination of patient, obtaining history from patient or surrogate, ordering and performing treatments and interventions, ordering and review of laboratory studies, ordering and review of radiographic studies, pulse oximetry and re-evaluation of patient's condition.   Marland Kitchen1-3 Lead EKG Interpretation  Performed by: Irean Hong, MD Authorized by: Irean Hong, MD     Interpretation: abnormal     ECG rate:  110   ECG rate assessment: tachycardic     Rhythm: sinus tachycardia     Ectopy: none     Conduction: normal   Comments:     Patient placed on cardiac monitor to evaluate for arrhythmias    MEDICATIONS ORDERED IN ED: Medications  lactated ringers bolus 1,000 mL (1,000 mLs Intravenous New Bag/Given 03/08/23 0636)    And  lactated ringers bolus 1,000 mL (1,000 mLs Intravenous New Bag/Given 03/08/23 0656)    And  lactated ringers bolus 1,000 mL (has no administration in time range)  cefTRIAXone (ROCEPHIN) 2 g in sodium chloride 0.9 % 100 mL IVPB (has no administration in time range)  azithromycin (ZITHROMAX) 500 mg in sodium chloride 0.9 % 250 mL IVPB (500 mg Intravenous New Bag/Given  03/08/23 0642)  vancomycin (VANCOCIN) IVPB 1000 mg/200 mL premix (has no administration in time range)  acetaminophen (TYLENOL) suppository 975 mg (975 mg Rectal Given 03/08/23 0644)     IMPRESSION / MDM / ASSESSMENT AND PLAN / ED COURSE  I reviewed the triage vital signs and the nursing notes.                             82 year old male presenting with fever, altered mentation, cough, currently on Levaquin for UTI. Differential diagnosis includes, but is not limited to, alcohol, illicit or prescription medications, or other toxic ingestion; intracranial pathology such as stroke or intracerebral hemorrhage; fever or infectious causes  including sepsis; hypoxemia and/or hypercarbia; uremia; trauma; endocrine related disorders such as diabetes, hypoglycemia, and thyroid-related diseases; hypertensive encephalopathy; etc. I have personally reviewed patient's records and note that there are no prior records for review.  Patient's presentation is most consistent with acute presentation with potential threat to life or bodily function.  The patient is on the cardiac monitor to evaluate for evidence of arrhythmia and/or significant heart rate changes.  Patient meets sepsis criteria on arrival.  Administer 30 cc/kilo IV fluids, IV Rocephin and azithromycin for presumed community-acquired pneumonia.  Obtain lab work, UA, CT head, CT maxillofacial to evaluate for abscess, chest x-ray.  Anticipate hospitalization.  Clinical Course as of 03/08/23 0657  Sun Mar 08, 2023  0656 WBC 35, chest x-ray demonstrates multilobar pneumonia.  Will add vancomycin for broader IV antibiotic coverage.  Rest of lab work and imaging studies are pending at change of shift.  Care transferred to the oncoming provider.  Anticipate hospitalization. [JS]    Clinical Course User Index [JS] Irean Hong, MD     FINAL CLINICAL IMPRESSION(S) / ED DIAGNOSES   Final diagnoses:  Fever, unspecified fever cause  Sepsis, due to  unspecified organism, unspecified whether acute organ dysfunction present [facility])  Bronchopneumonia     Rx / DC Orders   ED Discharge Orders     None        Note:  This document was prepared using Dragon voice recognition software and may include unintentional dictation errors.   Irean Hong, MD 03/08/23 [phone]

## 2023-03-08 NOTE — Progress Notes (Signed)
CODE SEPSIS - PHARMACY COMMUNICATION  **Broad Spectrum Antibiotics should be administered within 1 hour of Sepsis diagnosis**  Time Code Sepsis Called/Page Received: 8/25 @ 1610  Antibiotics Ordered: Ceftriaxone, Azithromycin   Time of 1st antibiotic administration: Azithromycin 500 mg IV x 1 on 8/25 @ 0642   Additional action taken by pharmacy:   If necessary, Name of Provider/Nurse Contacted:     Lagena Strand D ,PharmD Clinical Pharmacist  03/08/2023  6:55 AM

## 2023-03-08 NOTE — ED Notes (Signed)
Patient pulled up in bed. Patient turned to the right with pillow under backside. Pillows placed under arms and legs

## 2023-03-08 NOTE — H&P (Addendum)
History and Physical    Philip Robbins ZHY:[id number] DOB: 1940-12-01 DOA: 03/08/2023  PCP: Pcp, No (Confirm with patient/family/NH records and if not entered, this has to be entered at [facility] point of entry) Patient coming from: Home  I have personally briefly reviewed patient's old medical records in [facility]  Chief Complaint: Cough, feeling weak, shoulder pain  HPI: Philip Robbins is a 82 y.o. male with medical history significant of CLL under surveillance, BPH, HTN, IIDM, hypothyroidism, HLD, chronic leukocytosis presented with altered mentations hypoxia.  His symptoms started last Friday, patient went to have a left neck lymph node biopsy, after the procedure patient developed URI-like symptoms with runny nose sore throat, yesterday, patient developed strong burning sensation in the urine and dark-colored urine and went to see urgent care was diagnosed with UTI and started on antibiotics.  Patient continued to feel malaise generalized weakness, nauseous but no vomiting and no abdominal pain overnight and this morning patient was found in the bed unresponsive with large vomitus on his bed and foams in his mouth.  Family called EMS, EMS arrived and found patient hypoxic O2 saturation 95% on room air and patient was placed on 15 L NRB and sent to ED.  Fever 101.8.  Wife also witnessed patient had some twitching movement of his arms and suspect whether he had seizure.  At baseline patient has BPH appears to be poorly controlled, he reported Brueggen screams and frequent nocturnal urination but no history of UTI before.  ED Course: Fever 101.2, tachycardia hypotensive oxygen saturation 91% on 6 L.  Chest x-ray showed bilateral infiltrates indicating for pneumonia.  WBC 35.1.  Hemoglobin 12, sodium 118, K4.1, bicarb 12, creatinine 1.1, troponin 64> 99, AST 67, ALT 46  Patient was given 3 L IV bolus and started on ceftriaxone, azithromycin  Review of Systems: As per HPI otherwise 14 point  review of systems negative.    Past Medical History:  Diagnosis Date   UTI (urinary tract infection)     The histories are not reviewed yet. Please review them in the "History" navigator section and refresh this SmartLink.   has no history on file for tobacco use, alcohol use, and drug use.  No Known Allergies  No family history on file.   Prior to Admission medications   Medication Sig Start Date End Date Taking? Authorizing Provider  atorvastatin (LIPITOR) 40 MG tablet Take 40 mg by mouth daily.   Yes [provider]  fluticasone (FLONASE) 50 MCG/ACT nasal [place] Place 2 sprays into both nostrils every evening.   Yes [provider]  levofloxacin (LEVAQUIN) 750 MG tablet Take 750 mg by mouth daily.   Yes [provider]  levothyroxine (SYNTHROID) 75 MCG tablet Take 75 mcg by mouth daily before breakfast.   Yes [provider]  lisinopril (ZESTRIL) 10 MG tablet Take 10 mg by mouth daily.   Yes [provider]  metFORMIN (GLUCOPHAGE) 1000 MG tablet Take 1,000 mg by mouth 2 (two) times daily with a meal.   Yes [provider]  pioglitazone (ACTOS) 45 MG tablet Take 45 mg by mouth daily.   Yes [provider]  sitaGLIPtin (JANUVIA) 50 MG tablet Take 50 mg by mouth daily.   Yes [provider]  tamsulosin (FLOMAX) 0.4 MG CAPS capsule Take 0.4 mg by mouth daily.   Yes [provider]    Physical Exam: Vitals:   03/08/23 0822 03/08/23 0830 03/08/23 1000 03/08/23 1030  BP:  Marland Kitchen)  125/58 118/66 (!) 124/53  Pulse:  87 86 86  Resp:  (!) 33 (!) 25 (!) 30  Temp: 99.3 F (37.4 C)     TempSrc: Oral     SpO2:  97% 96% 95%  Weight:      Height:        Constitutional: NAD, calm, comfortable Vitals:   03/08/23 0822 03/08/23 0830 03/08/23 1000 03/08/23 1030  BP:  (!) 125/58 118/66 (!) 124/53  Pulse:  87 86 86  Resp:  (!) 33 (!) 25 (!) 30  Temp: 99.3 F (37.4 C)     TempSrc: Oral     SpO2:  97% 96% 95%   Weight:      Height:       Eyes: PERRL, lids and conjunctivae normal ENMT: Mucous membranes are dry. Posterior pharynx clear of any exudate or lesions.Normal dentition.  Neck: normal, supple, no masses, no thyromegaly Respiratory: clear to auscultation bilaterally, no wheezing, coarse breathing sound bilaterally increasing respiratory effort. No accessory muscle use.  Cardiovascular: Regular rate and rhythm, no murmurs / rubs / gallops. No extremity edema. 2+ pedal pulses. No carotid bruits.  Abdomen: no tenderness, no masses palpated. No hepatosplenomegaly. Bowel sounds positive.  Musculoskeletal: no clubbing / cyanosis. No joint deformity upper and lower extremities. Good ROM, no contractures. Normal muscle tone.  Skin: no rashes, lesions, ulcers. No induration Neurologic: CN 2-12 grossly intact. Sensation intact, DTR normal. Strength 5/5 in all 4.  Psychiatric: Normal judgment and insight. Alert and oriented x 3. Normal mood.     Labs on Admission: I have personally reviewed following labs and imaging studies  CBC: Recent Labs  Lab 03/08/23 0620  WBC 35.1*  NEUTROABS 19.8*  HGB 12.1*  HCT 36.5*  MCV 90.1  PLT 218   Basic Metabolic Panel: Recent Labs  Lab 03/08/23 0620  NA 118*  K 4.1  CL 92*  CO2 12*  GLUCOSE 201*  BUN 17  CREATININE 1.13  CALCIUM 8.3*   GFR: Estimated Creatinine Clearance: 61.7 mL/min (by C-G formula based on SCr of 1.13 mg/dL). Liver Function Tests: Recent Labs  Lab 03/08/23 0620  AST 67*  ALT 46*  ALKPHOS 43  BILITOT 2.1*  PROT 6.2*  ALBUMIN 3.4*   No results for input(s): "LIPASE", "AMYLASE" in the last 168 hours. No results for input(s): "AMMONIA" in the last 168 hours. Coagulation Profile: No results for input(s): "INR", "PROTIME" in the last 168 hours. Cardiac Enzymes: No results for input(s): "CKTOTAL", "CKMB", "CKMBINDEX", "TROPONINI" in the last 168 hours. BNP (last 3 results) No results for input(s): "PROBNP" in the last  8760 hours. HbA1C: No results for input(s): "HGBA1C" in the last 72 hours. CBG: No results for input(s): "GLUCAP" in the last 168 hours. Lipid Profile: No results for input(s): "CHOL", "HDL", "LDLCALC", "TRIG", "CHOLHDL", "LDLDIRECT" in the last 72 hours. Thyroid Function Tests: No results for input(s): "TSH", "T4TOTAL", "FREET4", "T3FREE", "THYROIDAB" in the last 72 hours. Anemia Panel: No results for input(s): "VITAMINB12", "FOLATE", "FERRITIN", "TIBC", "IRON", "RETICCTPCT" in the last 72 hours. Urine analysis: No results found for: "COLORURINE", "APPEARANCEUR", "LABSPEC", "PHURINE", "GLUCOSEU", "HGBUR", "BILIRUBINUR", "KETONESUR", "PROTEINUR", "UROBILINOGEN", "NITRITE", "LEUKOCYTESUR"  Radiological Exams on Admission: CT Head Wo Contrast  Result Date: 03/08/2023 CLINICAL DATA:  Hypoxia and fever.  Mental status change. EXAM: CT HEAD WITHOUT CONTRAST CT MAXILLOFACIAL WITHOUT CONTRAST TECHNIQUE: Multidetector CT imaging of the head and maxillofacial structures were performed using the standard protocol without intravenous contrast. Multiplanar CT image reconstructions of the maxillofacial structures  were also generated. RADIATION DOSE REDUCTION: This exam was performed according to the departmental dose-optimization program which includes automated exposure control, adjustment of the mA and/or kV according to patient size and/or use of iterative reconstruction technique. COMPARISON:  None Available. FINDINGS: CT HEAD FINDINGS Brain: No evidence of acute infarction, hemorrhage, hydrocephalus, extra-axial collection or mass lesion/mass effect. Generalized atrophy. Vascular: No hyperdense vessel or unexpected calcification. Skull: Normal. Negative for fracture or focal lesion. CT MAXILLOFACIAL FINDINGS Osseous: No fracture or mandibular dislocation. No destructive process. Orbits: Negative. No traumatic or inflammatory finding. Sinuses: Prior endoscopic sinus surgery.  No hemosinus. Soft tissues: No  acute finding IMPRESSION: 1. No acute finding or specific cause for symptoms. 2. Generalized brain atrophy. Electronically Signed   By: Tiburcio Pea M.D.   On: 03/08/2023 08:34   CT Maxillofacial W Contrast  Result Date: 03/08/2023 CLINICAL DATA:  Hypoxia and fever.  Mental status change. EXAM: CT HEAD WITHOUT CONTRAST CT MAXILLOFACIAL WITHOUT CONTRAST TECHNIQUE: Multidetector CT imaging of the head and maxillofacial structures were performed using the standard protocol without intravenous contrast. Multiplanar CT image reconstructions of the maxillofacial structures were also generated. RADIATION DOSE REDUCTION: This exam was performed according to the departmental dose-optimization program which includes automated exposure control, adjustment of the mA and/or kV according to patient size and/or use of iterative reconstruction technique. COMPARISON:  None Available. FINDINGS: CT HEAD FINDINGS Brain: No evidence of acute infarction, hemorrhage, hydrocephalus, extra-axial collection or mass lesion/mass effect. Generalized atrophy. Vascular: No hyperdense vessel or unexpected calcification. Skull: Normal. Negative for fracture or focal lesion. CT MAXILLOFACIAL FINDINGS Osseous: No fracture or mandibular dislocation. No destructive process. Orbits: Negative. No traumatic or inflammatory finding. Sinuses: Prior endoscopic sinus surgery.  No hemosinus. Soft tissues: No acute finding IMPRESSION: 1. No acute finding or specific cause for symptoms. 2. Generalized brain atrophy. Electronically Signed   By: Tiburcio Pea M.D.   On: 03/08/2023 08:34   DG Chest Port 1 View  Result Date: 03/08/2023 CLINICAL DATA:  82 year old male with history of shortness of breath. EXAM: PORTABLE CHEST 1 VIEW COMPARISON:  No priors. FINDINGS: Lung volumes are low. Diffuse interstitial prominence and peribronchial cuffing concerning for an acute bronchitis. Patchy ill-defined opacities also noted throughout the mid to lower lungs  bilaterally, which may suggest developing multilobar bilateral bronchopneumonia. No definite pleural effusions. No pneumothorax. No evidence of pulmonary edema. Heart size is normal. Upper mediastinal contours are distorted by patient positioning. Atherosclerotic calcifications in the thoracic aorta. IMPRESSION: 1. The appearance the chest is concerning for bronchitis likely with developing multilobar bilateral bronchopneumonia, as above. 2. Aortic atherosclerosis. Electronically Signed   By: Trudie Reed M.D.   On: 03/08/2023 06:37    EKG: Independently reviewed.  Sinus, no acute ST changes, poor R wave progression  Assessment/Plan Principal Problem:   Pneumonia Active Problems:   CAP (community acquired pneumonia)  (please populate well all problems here in Problem List. (For example, if patient is on BP meds at home and you resume or decide to hold them, it is a problem that needs to be her. Same for CAD, COPD, HLD and so on)  Sepsis, severe -Evidenced by new onset of hypoxia, elevated temperature and elevated lactic acid with signs of endorgan damage of acute encephalopathy and source of infection likely aspiration pneumonia and UTI -Received 3 L IV bolus and volume status.  Mild volume depleted, plan to continue IV fluid for 6 hours and then reevaluate.  Meantime will monitor sodium level. -Continue ceftriaxone azithromycin  to cover pneumonia and UTI  Acute hyponatremia -Hypovolemic, likely secondary to acute GI loss from repeated vomiting overnight and sepsis -As portion still has signs of volume depletion, will replenish volume first then start to correct sodium.  Text on-call nephrology and waiting for reply. 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  Repeat sodium level and likely will start  hypertonic saline, consult nephrology. -Seizure precaution.  Troponin elevation -No chest pains, EKG showed no acute ST changes and the poor R wave progression appears to be chronic. -Etiology likely related to sepsis and acute hypoxia and demanding ischemia.  Treat sepsis, repeat troponin this evening.  Aspiration pneumonia -Likely secondary to repeated vomiting last night, management as above  UTI -Likely secondary to uncontrolled BPH, antibiotics -Check PVR, outpatient follow-up with urology for urodynamic study -Continue Flomax  CLL with chronic leukocytosis -Peripheral smear is being reviewed to rule out acute transformation -Management of sepsis as above  HTN -Hold off home BP meds -Start as needed hydralazine  IIDM -Hold off metformin -Start SSI  DVT prophylaxis: Lovenox Code Status: Full code Family Communication: Daughter and wife at bedside Disposition Plan: Patient is sick with sepsis requiring IV antibiotics and hyponatremia requiring inpatient nephrology consultation, expect more than 2 midnight hospital stay Consults called: Text nephrology Admission status: PCU   Emeline General MD [facility] Pager [phone]  03/08/2023, 11:04 AM

## 2023-03-08 NOTE — ED Provider Notes (Signed)
.  Critical Care  Performed by: Sharman Cheek, MD Authorized by: Sharman Cheek, MD   Critical care provider statement:    Critical care time (minutes):  35   Critical care time was exclusive of:  Separately billable procedures and treating other patients   Critical care was necessary to treat or prevent imminent or life-threatening deterioration of the following conditions:  Sepsis, metabolic crisis and shock   Critical care was time spent personally by me on the following activities:  Development of treatment plan with patient or surrogate, discussions with consultants, evaluation of patient's response to treatment, examination of patient, obtaining history from patient or surrogate, ordering and performing treatments and interventions, ordering and review of laboratory studies, ordering and review of radiographic studies, pulse oximetry, re-evaluation of patient's condition and review of old charts   Care discussed with: admitting provider     Clinical Course as of 03/08/23 0933  Sun Mar 08, 2023  0656 WBC 35, chest x-ray demonstrates multilobar pneumonia.  Will add vancomycin for broader IV antibiotic coverage.  Rest of lab work and imaging studies are pending at change of shift.  Care transferred to the oncoming provider.  Anticipate hospitalization. [JS]    Clinical Course User Index [JS] Irean Hong, MD    ----------------------------------------- 9:33 AM on 03/08/2023 -----------------------------------------  3 liter IVF bolus completed. Sepsis reassessment completed. BP normal, HR normalized. Repeat lactate improved and less than 4. Vasopressors not indicated.   Results d/w pt and family members at bedside.   Will start NS infusion for continued hydration and hyponatremia treatment. Will admit.    Sharman Cheek, MD 03/08/23 [phone]

## 2023-03-09 DIAGNOSIS — J189 Pneumonia, unspecified organism: Secondary | ICD-10-CM | POA: Diagnosis not present

## 2023-03-09 LAB — GLUCOSE, CAPILLARY
Glucose-Capillary: 143 mg/dL — ABNORMAL HIGH (ref 70–99)
Glucose-Capillary: 142 mg/dL — ABNORMAL HIGH (ref 70–99)
Glucose-Capillary: 159 mg/dL — ABNORMAL HIGH (ref 70–99)
Glucose-Capillary: 162 mg/dL — ABNORMAL HIGH (ref 70–99)

## 2023-03-09 LAB — CBC
HCT: 29.7 % — ABNORMAL LOW (ref 39.0–52.0)
Hemoglobin: 9.9 g/dL — ABNORMAL LOW (ref 13.0–17.0)
MCH: 29.2 pg (ref 26.0–34.0)
MCHC: 33.3 g/dL (ref 30.0–36.0)
MCV: 87.6 fL (ref 80.0–100.0)
Platelets: 200 10*3/uL (ref 150–400)
RBC: 3.39 MIL/uL — ABNORMAL LOW (ref 4.22–5.81)
RDW: 15.2 % (ref 11.5–15.5)
WBC: 34.7 10*3/uL — ABNORMAL HIGH (ref 4.0–10.5)
nRBC: 0 % (ref 0.0–0.2)

## 2023-03-09 LAB — BASIC METABOLIC PANEL
Anion gap: 5 (ref 5–15)
BUN: 13 mg/dL (ref 8–23)
CO2: 22 mmol/L (ref 22–32)
Calcium: 8.2 mg/dL — ABNORMAL LOW (ref 8.9–10.3)
Chloride: 96 mmol/L — ABNORMAL LOW (ref 98–111)
Creatinine, Ser: 0.79 mg/dL (ref 0.61–1.24)
GFR, Estimated: >60 mL/min (ref >60)
Glucose, Bld: 148 mg/dL — ABNORMAL HIGH (ref 70–99)
Potassium: 4.2 mmol/L (ref 3.5–5.1)
Sodium: 123 mmol/L — ABNORMAL LOW (ref 135–145)

## 2023-03-09 LAB — URINE CULTURE: Culture: NO GROWTH

## 2023-03-09 LAB — FOLATE: Folate: 8.3 ng/mL (ref >5.9)

## 2023-03-09 LAB — VITAMIN B12: Vitamin B-12: 109 pg/mL — ABNORMAL LOW (ref 180–914)

## 2023-03-09 LAB — SODIUM
Sodium: 122 mmol/L — ABNORMAL LOW (ref 135–145)
Sodium: 125 mmol/L — ABNORMAL LOW (ref 135–145)
Sodium: 127 mmol/L — ABNORMAL LOW (ref 135–145)
Sodium: 127 mmol/L — ABNORMAL LOW (ref 135–145)
Sodium: 127 mmol/L — ABNORMAL LOW (ref 135–145)

## 2023-03-09 LAB — IRON AND TIBC
Iron: 16 ug/dL — ABNORMAL LOW (ref 45–182)
Saturation Ratios: 6 % — ABNORMAL LOW (ref 17.9–39.5)
TIBC: 259 ug/dL (ref 250–450)
UIBC: 243 ug/dL

## 2023-03-09 LAB — VITAMIN D 25 HYDROXY (VIT D DEFICIENCY, FRACTURES): Vit D, 25-Hydroxy: 23.33 ng/mL — ABNORMAL LOW (ref 30–100)

## 2023-03-09 LAB — PHOSPHORUS: Phosphorus: 2.2 mg/dL — ABNORMAL LOW (ref 2.5–4.6)

## 2023-03-09 LAB — MAGNESIUM: Magnesium: 1.7 mg/dL (ref 1.7–2.4)

## 2023-03-09 LAB — PATHOLOGIST SMEAR REVIEW

## 2023-03-09 LAB — PROCALCITONIN: Procalcitonin: 1.74 ng/mL

## 2023-03-09 LAB — CK: Total CK: 635 U/L — ABNORMAL HIGH (ref 49–397)

## 2023-03-09 MED ORDER — IPRATROPIUM-ALBUTEROL 0.5-2.5 (3) MG/3ML IN SOLN
3.0000 mL | Freq: Four times a day (QID) | RESPIRATORY_TRACT | Status: DC
  Administered 2023-03-09: 3 mL via RESPIRATORY_TRACT
  Filled 2023-03-09: qty 3

## 2023-03-09 MED ORDER — KETOROLAC TROMETHAMINE 15 MG/ML IJ SOLN
15.0000 mg | Freq: Four times a day (QID) | INTRAMUSCULAR | Status: DC | PRN
  Administered 2023-03-09 – 2023-03-13 (×5): 15 mg via INTRAVENOUS
  Filled 2023-03-09 (×5): qty 1

## 2023-03-09 MED ORDER — GUAIFENESIN ER 600 MG PO TB12
600.0000 mg | ORAL_TABLET | Freq: Two times a day (BID) | ORAL | Status: DC
  Administered 2023-03-09 – 2023-03-19 (×20): 600 mg via ORAL
  Filled 2023-03-09 (×20): qty 1

## 2023-03-09 MED ORDER — ACETAMINOPHEN 325 MG PO TABS
650.0000 mg | ORAL_TABLET | Freq: Four times a day (QID) | ORAL | Status: DC | PRN
  Administered 2023-03-12 – 2023-03-13 (×3): 650 mg via ORAL
  Filled 2023-03-09 (×4): qty 2

## 2023-03-09 MED ORDER — HYDROCOD POLI-CHLORPHE POLI ER 10-8 MG/5ML PO SUER
5.0000 mL | Freq: Two times a day (BID) | ORAL | Status: DC | PRN
  Administered 2023-03-10: 5 mL via ORAL
  Filled 2023-03-09 (×2): qty 5

## 2023-03-09 MED ORDER — OXYMETAZOLINE HCL 0.05 % NA SOLN
1.0000 | Freq: Two times a day (BID) | NASAL | Status: AC
  Administered 2023-03-09 – 2023-03-12 (×6): 1 via NASAL
  Filled 2023-03-09: qty 15

## 2023-03-09 NOTE — Progress Notes (Signed)
[facility] Progress Note  Patient: Philip Robbins    ION:[id number]  DOA: 03/08/2023     Date of Service: the patient was seen and examined on 03/09/2023  Chief Complaint  Patient presents with   Fever   Brief hospital course:  Philip Robbins is a 82 y.o. male with medical history significant of CLL under surveillance, BPH, HTN, IIDM, hypothyroidism, HLD, chronic leukocytosis presented with altered mentations hypoxia.   His symptoms started last Friday, patient went to have a left neck lymph node biopsy, after the procedure patient developed URI-like symptoms with runny nose sore throat, yesterday, patient developed strong burning sensation in the urine and dark-colored urine and went to see urgent care was diagnosed with UTI and started on antibiotics.  Patient continued to feel malaise generalized weakness, nauseous but no vomiting and no abdominal pain overnight and this morning patient was found in the bed unresponsive with large vomitus on his bed and foams in his mouth.  Family called EMS, EMS arrived and found patient hypoxic O2 saturation 95% on room air and patient was placed on 15 L NRB and sent to ED.  Fever 101.8.  Wife also witnessed patient had some twitching movement of his arms and suspect whether he had seizure.  At baseline patient has BPH appears to be poorly controlled, he reported Brueggen screams and frequent nocturnal urination but no history of UTI before.   ED Course: Fever 101.2, tachycardia hypotensive oxygen saturation 91% on 6 L.  Chest x-ray showed bilateral infiltrates indicating for pneumonia.  WBC 35.1.  Hemoglobin 12, sodium 118, K4.1, bicarb 12, creatinine 1.1, troponin 64> 99, AST 67, ALT 46   Patient was given 3 L IV bolus and started on ceftriaxone, azithromycin Assessment and Plan:  Sepsis, severe -Evidenced by new onset of hypoxia, elevated temperature and elevated lactic acid with signs of endorgan damage of acute encephalopathy and source of  infection likely aspiration pneumonia and UTI -Received 3 L IV bolus and volume status.  Mild volume depleted, plan to continue IV fluid for 6 hours and then reevaluate.  Meantime will monitor sodium level. -Continue ceftriaxone azithromycin to cover pneumonia and UTI   Acute hyponatremia -Hypovolemic, likely secondary to acute GI loss from repeated vomiting overnight and sepsis -As portion still has signs of volume depletion, will replenish volume first then start to correct sodium.   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  Repeat sodium level and likely will start hypertonic saline, consult nephrology. -Seizure precaution.   Troponin elevation -No chest pains, EKG showed no acute ST changes and the poor R wave progression appears to be chronic. -Etiology likely related to sepsis and acute hypoxia and demanding ischemia.  Treat sepsis, repeat troponin this evening.   Aspiration pneumonia -Likely secondary to repeated vomiting last night, management as above   UTI -Likely secondary to uncontrolled BPH, antibiotics -Check PVR, outpatient follow-up with urology for urodynamic study -Continue Flomax   CLL with chronic leukocytosis -Peripheral smear is being reviewed to rule out acute transformation -Management of sepsis as above   HTN -Hold off home BP meds -Start as needed hydralazine  IIDM -Hold off metformin -Start SSI   Body mass index is 33.5 kg/m.  Interventions:  Diet: Heart healthy diet DVT Prophylaxis: Subcutaneous Lovenox   Advance goals of care discussion: Full code  Family  Communication: family was present at bedside, at the time of interview.  The pt provided permission to discuss medical plan with the family. Opportunity was given to ask question and all questions were answered satisfactorily.   Disposition:  Pt is from Home, admitted with hyponatremia, pneumonia, respiratory failure, UTI, b/l.  Humerus fracture, still has resp failure and low sodium, which precludes a safe discharge. Discharge to SNF , when medically stable.  Subjective: No significant overnight patient was complaining of 1/10 proved after Medication.  Still has shortness of breath, mild cough and congestion.  Physical Exam: General: NAD, lying comfortably Appear in no distress, affect appropriate Eyes: PERRLA ENT: Oral Mucosa Clear, moist  Neck: no JVD,  Cardiovascular: S1 and S2 Present, no Murmur,  Respiratory: Good air entry bilaterally, b/l  crackles and mild wheezing. Abdomen: Bowel Sound present, Soft and no tenderness,  Skin: no rashes Extremities: no Pedal edema, no calf tenderness Neurologic: without any new focal findings Gait not checked due to patient safety concerns  Vitals:   03/09/23 1400 03/09/23 1500 03/09/23 1600 03/09/23 1700  BP: (!) 122/54 (!) 135/58 136/65 (!) 139/59  Pulse: 76 81 84 87  Resp: 13 (!) 23 17 (!) 26  Temp: 98.2 F (36.8 C)     TempSrc: Oral     SpO2: 96% 96% 97% 97%  Weight:      Height:        Intake/Output Summary (Last 24 hours) at 03/09/2023 1825 Last data filed at 03/09/2023 1700 Gross per 24 hour  Intake 673.15 ml  Output 500 ml  Net 173.15 ml   Filed Weights   03/08/23 0610 03/08/23 1314  Weight: 99.8 kg 105.9 kg    Data Reviewed: I have personally reviewed and interpreted daily labs, tele strips, imagings as discussed above. I reviewed all nursing notes, pharmacy notes, vitals, pertinent old records I have discussed plan of care as described above with RN and patient/family.  CBC: Recent Labs  Lab 03/08/23 0620  03/09/23 0436  WBC 35.1* 34.7*  NEUTROABS 19.8*  --   HGB 12.1* 9.9*  HCT 36.5* 29.7*  MCV 90.1 87.6  PLT 218 200   Basic Metabolic Panel: Recent Labs  Lab 03/08/23 0620 03/08/23 1110 03/08/23 1353 03/09/23 0005 03/09/23 0436 03/09/23 0927 03/09/23 1154 03/09/23 1606  NA 118* 118*   < > 122* 123* 127* 127* 125*  K 4.1 4.1  --   --  4.2  --   --   --   CL 92* 93*  --   --  96*  --   --   --   CO2 12* 18*  --   --  22  --   --   --   GLUCOSE 201* 184*  --   --  148*  --   --   --   BUN 17 13  --   --  13  --   --   --   CREATININE 1.13 0.85  --   --  0.79  --   --   --   CALCIUM 8.3* 8.1*  --   --  8.2*  --   --   --   MG  --   --   --   --  1.7  --   --   --  PHOS  --   --   --   --  2.2*  --   --   --    < > = values in this interval not displayed.    Studies: No results found.  Scheduled Meds:  azithromycin  500 mg Oral Daily   Chlorhexidine Gluconate Cloth  6 each Topical Q0600   diclofenac Sodium  2 g Topical QID   enoxaparin (LOVENOX) injection  0.5 mg/kg Subcutaneous Q24H   guaiFENesin  600 mg Oral BID   insulin aspart  0-20 Units Subcutaneous TID WC   levothyroxine  50 mcg Oral Q0600   Continuous Infusions:  cefTRIAXone (ROCEPHIN)  IV Stopped (03/09/23 0935)   sodium chloride (hypertonic) 25 mL/hr at 03/09/23 1700   PRN Meds: acetaminophen, chlorpheniramine-HYDROcodone, hydrALAZINE, HYDROmorphone (DILAUDID) injection, ketorolac, ondansetron **OR** ondansetron (ZOFRAN) IV  Time spent: 55 minutes  Author: Gillis Santa. MD [facility] 03/09/2023 6:25 PM  To reach On-call, see care teams to locate the attending and reach out to them via [email or web address]. If 7PM-7AM, please contact night-coverage If you still have difficulty reaching the attending provider, please page the [facility] (Director on Call) for [facility] on amion for assistance.

## 2023-03-09 NOTE — Progress Notes (Signed)
PT Cancellation Note  Patient Details Name: Philip Robbins MRN: [id number] DOB: Jun 15, 1941   Cancelled Treatment:    Reason Eval/Treat Not Completed: Medical issues which prohibited therapy Orders received, chart reviewed. Per RN and chart, patient with B humeral fxs and L shoulder dislocation. Awaiting for MD to place ortho consult. Will re-attempt once medically appropriate.   Philip Robbins, PT, DPT Physical Therapist - [facility]  Philip Robbins   Philip Robbins 03/09/2023, 10:02 AM

## 2023-03-09 NOTE — Plan of Care (Signed)
  Problem: Activity: Goal: Ability to tolerate increased activity will improve Outcome: Progressing   Problem: Clinical Measurements: Goal: Ability to maintain a body temperature in the normal range will improve Outcome: Progressing   Problem: Respiratory: Goal: Ability to maintain a clear airway will improve Outcome: Progressing   Problem: Education: Goal: Ability to describe self-care measures that may prevent or decrease complications (Diabetes Survival Skills Education) will improve Outcome: Progressing   Problem: Coping: Goal: Ability to adjust to condition or change in health will improve Outcome: Progressing   Problem: Fluid Volume: Goal: Ability to maintain a balanced intake and output will improve Outcome: Progressing   Problem: Health Behavior/Discharge Planning: Goal: Ability to identify and utilize available resources and services will improve Outcome: Progressing   Problem: Metabolic: Goal: Ability to maintain appropriate glucose levels will improve Outcome: Progressing   Problem: Skin Integrity: Goal: Risk for impaired skin integrity will decrease Outcome: Progressing   Problem: Education: Goal: Knowledge of General Education information will improve Description: Including pain rating scale, medication(s)/side effects and non-pharmacologic comfort measures Outcome: Progressing   Problem: Clinical Measurements: Goal: Will remain free from infection Outcome: Progressing Goal: Diagnostic test results will improve Outcome: Progressing

## 2023-03-09 NOTE — Consult Note (Signed)
Pharmacy Consulted for monitoring of Hypertonic Saline  Philip Robbins is a 82 year old M admited on 8/25 for UTI, hypoxia, hypotension and hyponatremia. Patient has a history of runny nose, sore throat and a strong burning sensation while urinating with dark colored urine. Also experienced generalized malaise. They were found this morning with large vomitus in bed with a foaming mouth. Some twitching movement was observed concerning for seizure. Likely hypovolemic secondary to repeated vomiting overnight and sepsis. Pharmacy was consulted for the monitoring of hypertonic saline.   MIVF:  NaCL 3% @25 /h (pending start 8/25 @ 1245) - per nephrology   Date/Time  Sodium Level  Rate of Infusion  8/25 at 1110  118    8/25 at 1353  120  3% NaCl at 25 mL/hr started at 1436  8/25 at 1740  120    8/25 at 2006  120   8/26 at 0005 122   8/26 at 0436 123   8/26 at 0927 127   8/26 at 1154 127    Goal of Therapy:  Goal of correction 8 mmol per 24 hours IF sodium rises > 4 mEq/L over 2 hours   > 6 mEq/L over 4 hours - Pharmacy will evaluate and contact MD per consult.  Plan:  Continue 3% NaCl infusion at 25 mL/hr; goal Na by tomorrow AM ~ 131 Na checks q4h Pharmacy will continue to monitor   Thank you for involving pharmacy in this patient's care.   Tressie Ellis 03/09/2023 1:12 PM

## 2023-03-09 NOTE — Progress Notes (Signed)
[facility]  ROUNDING NOTE   Subjective:   Na 123 - hypertonic saline  Wife and daughter at bedside.   Patient more alert and no seizure like activity overnight.   UOP 700  Urine and blood cultures with no growth.   Objective:  Vital signs in last 24 hours:  Temp:  [98.1 F (36.7 C)-100.3 F (37.9 C)] 98.3 F (36.8 C) (08/26 0800) Pulse Rate:  [70-89] 81 (08/26 0900) Resp:  [12-36] 20 (08/26 0900) BP: (100-129)/(43-56) 125/56 (08/26 0900) SpO2:  [92 %-98 %] 97 % (08/26 0900) Weight:  [105.9 kg] 105.9 kg (08/25 1314)  Weight change: 6.109 kg Filed Weights   03/08/23 0610 03/08/23 1314  Weight: 99.8 kg 105.9 kg    Intake/Output: I/O last 3 completed shifts: In: 2129.2 [I.V.:656.2; IV Piggyback:1473.1] Out: 1200 [Urine:1200]   Intake/Output this shift:  Total I/O In: 74.7 [I.V.:74.7] Out: -   Physical Exam: General: NAD, laying in bed  Head: Normocephalic, atraumatic. Moist oral mucosal membranes  Eyes: Anicteric, PERRL  Neck: Supple, trachea midline  Lungs:  Clear to auscultation  Heart: Regular rate and rhythm  Abdomen:  Soft, nontender,   Extremities:  no peripheral edema.  Neurologic: Nonfocal, moving all four extremities  Skin: No lesions  Access: none    Basic Metabolic Panel: Recent Labs  Lab 03/08/23 0620 03/08/23 1110 03/08/23 1353 03/08/23 1740 03/08/23 2006 03/09/23 0005 03/09/23 0436 03/09/23 0927  NA 118* 118*   < > 120* 120* 122* 123* 127*  K 4.1 4.1  --   --   --   --  4.2  --   CL 92* 93*  --   --   --   --  96*  --   CO2 12* 18*  --   --   --   --  22  --   GLUCOSE 201* 184*  --   --   --   --  148*  --   BUN 17 13  --   --   --   --  13  --   CREATININE 1.13 0.85  --   --   --   --  0.79  --   CALCIUM 8.3* 8.1*  --   --   --   --  8.2*  --    < > = values in this interval not displayed.    Liver Function Tests: Recent Labs  Lab 03/08/23 0620  AST 67*  ALT 46*  ALKPHOS 43  BILITOT 2.1*  PROT 6.2*   ALBUMIN 3.4*   No results for input(s): "LIPASE", "AMYLASE" in the last 168 hours. No results for input(s): "AMMONIA" in the last 168 hours.  CBC: Recent Labs  Lab 03/08/23 0620 03/09/23 0436  WBC 35.1* 34.7*  NEUTROABS 19.8*  --   HGB 12.1* 9.9*  HCT 36.5* 29.7*  MCV 90.1 87.6  PLT 218 200    Cardiac Enzymes: Recent Labs  Lab 03/08/23 1110 03/09/23 0436  CKTOTAL 623* 635*    BNP: Invalid input(s): "POCBNP"  CBG: Recent Labs  Lab 03/08/23 1221 03/08/23 1320 03/08/23 1631 03/08/23 2108 03/09/23 0822  GLUCAP 164* 171* 141* 159* 143*    Microbiology: Results for orders placed or performed during the hospital encounter of 03/08/23  Culture, blood (routine x 2)     Status: None (Preliminary result)   Collection Time: 03/08/23  6:15 AM   Specimen: BLOOD  Result Value Ref Range Status   Specimen Description BLOOD LEFT  ANTECUBITAL  Final   Special Requests   Final    BOTTLES DRAWN AEROBIC AND ANAEROBIC Blood Culture adequate volume   Culture   Final    NO GROWTH 1 DAY Performed at [facility], [address] [place], [place] [address]    Report Status PENDING  Incomplete  Culture, blood (routine x 2)     Status: None (Preliminary result)   Collection Time: 03/08/23  6:20 AM   Specimen: BLOOD  Result Value Ref Range Status   Specimen Description BLOOD RIGHT ANTECUBITAL  Final   Special Requests   Final    BOTTLES DRAWN AEROBIC AND ANAEROBIC Blood Culture adequate volume   Culture   Final    NO GROWTH 1 DAY Performed at [facility], [address] [place], [place] [address]    Report Status PENDING  Incomplete  Resp panel by RT-PCR (RSV, Flu A&B, Covid) Anterior Nasal Swab     Status: None   Collection Time: 03/08/23  6:28 AM   Specimen: Anterior Nasal Swab  Result Value Ref Range Status   SARS Coronavirus 2 by RT PCR NEGATIVE NEGATIVE Final    Comment: (NOTE) SARS-CoV-2 target nucleic acids are NOT DETECTED.  The SARS-CoV-2  RNA is generally detectable in upper respiratory specimens during the acute phase of infection. The lowest concentration of SARS-CoV-2 viral copies this assay can detect is 138 copies/mL. A negative result does not preclude SARS-Cov-2 infection and should not be used as the sole basis for treatment or other patient management decisions. A negative result may occur with  improper specimen collection/handling, submission of specimen other than nasopharyngeal swab, presence of viral mutation(s) within the areas targeted by this assay, and inadequate number of viral copies(<138 copies/mL). A negative result must be combined with clinical observations, patient history, and epidemiological information. The expected result is Negative.  Fact Sheet for Patients:  BloggerCourse.com  Fact Sheet for Healthcare Providers:  SeriousBroker.it  This test is no t yet approved or cleared by the [place] FDA and  has been authorized for detection and/or diagnosis of SARS-CoV-2 by FDA under an Emergency Use Authorization (EUA). This EUA will remain  in effect (meaning this test can be used) for the duration of the COVID-19 declaration under Section 564(b)(1) of the Act, 21 U.S.C.section 360bbb-3(b)(1), unless the authorization is terminated  or revoked sooner.       Influenza A by PCR NEGATIVE NEGATIVE Final   Influenza B by PCR NEGATIVE NEGATIVE Final    Comment: (NOTE) The Xpert Xpress SARS-CoV-2/FLU/RSV plus assay is intended as an aid in the diagnosis of influenza from Nasopharyngeal swab specimens and should not be used as a sole basis for treatment. Nasal washings and aspirates are unacceptable for Xpert Xpress SARS-CoV-2/FLU/RSV testing.  Fact Sheet for Patients: BloggerCourse.com  Fact Sheet for Healthcare Providers: SeriousBroker.it  This test is not yet approved or cleared by the  [place] FDA and has been authorized for detection and/or diagnosis of SARS-CoV-2 by FDA under an Emergency Use Authorization (EUA). This EUA will remain in effect (meaning this test can be used) for the duration of the COVID-19 declaration under Section 564(b)(1) of the Act, 21 U.S.C. section 360bbb-3(b)(1), unless the authorization is terminated or revoked.     Resp Syncytial Virus by PCR NEGATIVE NEGATIVE Final    Comment: (NOTE) Fact Sheet for Patients: BloggerCourse.com  Fact Sheet for Healthcare Providers: SeriousBroker.it  This test is not yet approved or cleared by the [place] FDA  and has been authorized for detection and/or diagnosis of SARS-CoV-2 by FDA under an Emergency Use Authorization (EUA). This EUA will remain in effect (meaning this test can be used) for the duration of the COVID-19 declaration under Section 564(b)(1) of the Act, 21 U.S.C. section 360bbb-3(b)(1), unless the authorization is terminated or revoked.  Performed at [facility], [address] [place], [place] [address]   MRSA Next Gen by PCR, Nasal     Status: None   Collection Time: 03/08/23  1:21 PM   Specimen: Nasal Mucosa; Nasal Swab  Result Value Ref Range Status   MRSA by PCR Next Gen NOT DETECTED NOT DETECTED Final    Comment: (NOTE) The GeneXpert MRSA Assay (FDA approved for NASAL specimens only), is one component of a comprehensive MRSA colonization surveillance program. It is not intended to diagnose MRSA infection nor to guide or monitor treatment for MRSA infections. Test performance is not FDA approved in patients less than 10 years old. Performed at [facility], [address] [place], [place] [address]     Coagulation Studies: No results for input(s): "LABPROT", "INR" in the last 72 hours.  Urinalysis: Recent Labs    03/08/23 0955  COLORURINE YELLOW*  LABSPEC >1.046*  PHURINE 5.0   GLUCOSEU 150*  HGBUR LARGE*  BILIRUBINUR NEGATIVE  KETONESUR 20*  PROTEINUR 30*  NITRITE NEGATIVE  LEUKOCYTESUR LARGE*      Imaging: DG Shoulder Left  Result Date: 03/08/2023 CLINICAL DATA:  Left shoulder pain. EXAM: LEFT SHOULDER - 2+ VIEW COMPARISON:  None Available. FINDINGS: A comminuted fracture is seen involving the left humeral head and neck. The bilateral view shows posterior subluxation of the humeral head, with anterior displacement of the humeral neck. IMPRESSION: Comminuted fracture of left humeral head and neck. Posterior subluxation of the humeral head, with anterior displacement of the humeral neck. Electronically Signed   By: Danae [place] M.D.   On: 03/08/2023 11:41   DG Shoulder Right  Result Date: 03/08/2023 CLINICAL DATA:  Right shoulder pain. EXAM: RIGHT SHOULDER - 2+ VIEW COMPARISON:  None Available. FINDINGS: A mildly displaced, comminuted fracture is seen involving the right humeral head and neck. No evidence of dislocation. IMPRESSION: Mildly displaced, comminuted fracture of the right humeral head and neck. Electronically Signed   By: Danae [place] M.D.   On: 03/08/2023 11:39   CT Head Wo Contrast  Result Date: 03/08/2023 CLINICAL DATA:  Hypoxia and fever.  Mental status change. EXAM: CT HEAD WITHOUT CONTRAST CT MAXILLOFACIAL WITHOUT CONTRAST TECHNIQUE: Multidetector CT imaging of the head and maxillofacial structures were performed using the standard protocol without intravenous contrast. Multiplanar CT image reconstructions of the maxillofacial structures were also generated. RADIATION DOSE REDUCTION: This exam was performed according to the departmental dose-optimization program which includes automated exposure control, adjustment of the mA and/or kV according to patient size and/or use of iterative reconstruction technique. COMPARISON:  None Available. FINDINGS: CT HEAD FINDINGS Brain: No evidence of acute infarction, hemorrhage, hydrocephalus, extra-axial  collection or mass lesion/mass effect. Generalized atrophy. Vascular: No hyperdense vessel or unexpected calcification. Skull: Normal. Negative for fracture or focal lesion. CT MAXILLOFACIAL FINDINGS Osseous: No fracture or mandibular dislocation. No destructive process. Orbits: Negative. No traumatic or inflammatory finding. Sinuses: Prior endoscopic sinus surgery.  No hemosinus. Soft tissues: No acute finding IMPRESSION: 1. No acute finding or specific cause for symptoms. 2. Generalized brain atrophy. Electronically Signed   By: Tiburcio Pea M.D.   On: 03/08/2023 08:34   CT Maxillofacial  W Contrast  Result Date: 03/08/2023 CLINICAL DATA:  Hypoxia and fever.  Mental status change. EXAM: CT HEAD WITHOUT CONTRAST CT MAXILLOFACIAL WITHOUT CONTRAST TECHNIQUE: Multidetector CT imaging of the head and maxillofacial structures were performed using the standard protocol without intravenous contrast. Multiplanar CT image reconstructions of the maxillofacial structures were also generated. RADIATION DOSE REDUCTION: This exam was performed according to the departmental dose-optimization program which includes automated exposure control, adjustment of the mA and/or kV according to patient size and/or use of iterative reconstruction technique. COMPARISON:  None Available. FINDINGS: CT HEAD FINDINGS Brain: No evidence of acute infarction, hemorrhage, hydrocephalus, extra-axial collection or mass lesion/mass effect. Generalized atrophy. Vascular: No hyperdense vessel or unexpected calcification. Skull: Normal. Negative for fracture or focal lesion. CT MAXILLOFACIAL FINDINGS Osseous: No fracture or mandibular dislocation. No destructive process. Orbits: Negative. No traumatic or inflammatory finding. Sinuses: Prior endoscopic sinus surgery.  No hemosinus. Soft tissues: No acute finding IMPRESSION: 1. No acute finding or specific cause for symptoms. 2. Generalized brain atrophy. Electronically Signed   By: Tiburcio Pea  M.D.   On: 03/08/2023 08:34   DG Chest Port 1 View  Result Date: 03/08/2023 CLINICAL DATA:  82 year old male with history of shortness of breath. EXAM: PORTABLE CHEST 1 VIEW COMPARISON:  No priors. FINDINGS: Lung volumes are low. Diffuse interstitial prominence and peribronchial cuffing concerning for an acute bronchitis. Patchy ill-defined opacities also noted throughout the mid to lower lungs bilaterally, which may suggest developing multilobar bilateral bronchopneumonia. No definite pleural effusions. No pneumothorax. No evidence of pulmonary edema. Heart size is normal. Upper mediastinal contours are distorted by patient positioning. Atherosclerotic calcifications in the thoracic aorta. IMPRESSION: 1. The appearance the chest is concerning for bronchitis likely with developing multilobar bilateral bronchopneumonia, as above. 2. Aortic atherosclerosis. Electronically Signed   By: Trudie Reed M.D.   On: 03/08/2023 06:37     Medications:    cefTRIAXone (ROCEPHIN)  IV 2 g (03/09/23 0905)   sodium chloride (hypertonic) 25 mL/hr at 03/09/23 0900    azithromycin  500 mg Oral Daily   Chlorhexidine Gluconate Cloth  6 each Topical Q0600   diclofenac Sodium  2 g Topical QID   enoxaparin (LOVENOX) injection  0.5 mg/kg Subcutaneous Q24H   guaiFENesin  600 mg Oral BID   insulin aspart  0-20 Units Subcutaneous TID WC   levothyroxine  50 mcg Oral Q0600   acetaminophen, chlorpheniramine-HYDROcodone, hydrALAZINE, HYDROmorphone (DILAUDID) injection, ketorolac, ondansetron **OR** ondansetron (ZOFRAN) IV  Assessment/ Plan:  Mr. TYRESS MUNZER is a 82 y.o.  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 Hypo-osmolar. Hypovolemic to euvolemic on examination. With neurologic symptoms. No improvement after 3 liters of LR infusion. 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 Acute hyponatremia seems secondary to hypovolemia. - Continue hypertonic saline infusion.    Urinary tract infection with septic shock and hypotension - holding lisinopril  - empiric antibiotics: ceftriaxone and azithromycin.    Diabetes mellitus type II with renal manifestations - holding metformin - continue glucose control.    LOS: 1 Linley Moxley 8/26/202410:52 AM

## 2023-03-09 NOTE — Consult Note (Signed)
ORTHOPEDIC CONSULTATION  LANI BEAVERS [id number]  03/09/2023  CC:  Chief Complaint  Patient presents with   Fever    History of Present IlIness: The patient is a 82 y.o. male who was admitted to the hospital in the early morning hours of 03-08-2023 for sepsis.  Orthopedics was consulted on 03-09-2023 for bilateral comminuted proximal humeral fractures.  Further questioning of the patient and his wife at the time of consultation reveal that he had been in his usual state of health until he had a biopsy performed on the left side of his face last week.  He then developed a urinary tract infection and later became confused and combative.  He was transported by EMS to the local emergency department where he was admitted for sepsis workup and altered mental status.  As far as the patient and his family know he has had no falls or trauma other than when he was combative with his altered mental status and being transported to the hospital.  This morning in the ICU the patient is alert and oriented x 3.  He reports both of his shoulders are sore and he has difficulty moving them.  He denies any neck or back pain.  He reports no tenderness in his lower extremities.  He denies numbness or tingling in his bilateral upper extremities and has no complaints of pain around his elbows, wrists, or hands.  Admission history for completeness: His symptoms started last Friday, patient went to have a left neck lymph node biopsy, after the procedure patient developed URI-like symptoms with runny nose sore throat, yesterday, patient developed strong burning sensation in the urine and dark-colored urine and went to see urgent care was diagnosed with UTI and started on antibiotics.  Patient continued to feel malaise generalized weakness, nauseous but no vomiting and no abdominal pain overnight and this morning patient was found in the bed unresponsive with large vomitus on his bed and foams in his mouth.  Family called EMS,  EMS arrived and found patient hypoxic O2 saturation 95% on room air and patient was placed on 15 L NRB and sent to ED.  Fever 101.8.  Wife also witnessed patient had some twitching movement of his arms and suspect whether he had seizure.  At baseline patient has BPH appears to be poorly controlled, he reported Brueggen screams and frequent nocturnal urination but no history of UTI before.   PMH:  Past Medical History:  Diagnosis Date   UTI (urinary tract infection)     SH: Reviewed in chart.  ALL: No Known Allergies  MED:  Medications Prior to Admission  Medication Sig Dispense Refill Last Dose   atorvastatin (LIPITOR) 40 MG tablet Take 40 mg by mouth daily.   03/07/2023   fluticasone (FLONASE) 50 MCG/ACT nasal Garant Place 2 sprays into both nostrils every evening.   03/07/2023   levofloxacin (LEVAQUIN) 750 MG tablet Take 750 mg by mouth daily.   03/07/2023   levothyroxine (SYNTHROID) 75 MCG tablet Take 75 mcg by mouth daily before breakfast.   03/07/2023   lisinopril (ZESTRIL) 10 MG tablet Take 10 mg by mouth daily.   03/07/2023   metFORMIN (GLUCOPHAGE) 1000 MG tablet Take 1,000 mg by mouth 2 (two) times daily with a meal.   03/07/2023   pioglitazone (ACTOS) 45 MG tablet Take 45 mg by mouth daily.   03/07/2023   sitaGLIPtin (JANUVIA) 50 MG tablet Take 50 mg by mouth daily.   03/07/2023   tamsulosin (FLOMAX) 0.4 MG  CAPS capsule Take 0.4 mg by mouth daily.   03/07/2023    All home medications have been reviewed as documented in the medication reconciliation portion of the patient record.  FH: No family history on file.  Social:    Review of Systems: General: Denies fever, chills, weight loss Eyes: Denies blurry vision, changes in vision ENT: Denies sore throat, congestions, nosebleeds CV: Denies chest pain, palpitations Respiratory: Denies shortness of breath, wheezing, cough Gl: Denies abdominal pain, nausea, vomiting GU: Denies hematuria Integumentary: Denies rashes or lesions Neuro:  Denies headache, dizziness Psych: Negative Hem/Onc: Denies easy bruising or bleeding disorders Musculoskeletal: See HPI above.  Vitals: BP (!) 121/52 (BP Location: Right Arm)   Pulse 75   Temp 98.3 F (36.8 C) (Oral)   Resp 17   Ht 5\' 10"  (1.778 m)   Wt 105.9 kg   SpO2 95%   BMI 33.50 kg/m    Physical Exam: General: Awake, alert and oriented, no acute distress. Eyes: Pupils reactive, EOMI, normal conjunctiva, no scleral icterus. HENT: Normocephalic, atraumatic, normal hearing, moist oral mucosa Neck: Supple, non-tender, no cervical lymphadenopathy. Lungs: Chest rise is symmetric, non-labored respiration, chest wall nontender to palpation Heart: Normal rate by palpation, normal peripheral perfusion Abdomen: Soft, non-tender, non-distended. Pelvis is stable. Skin: Skin envelope intact, dry and pink, no rashes or lesions, no signs of infection. Neurologic: Awake, alert, and oriented X3 Psychiatric: Cooperative, appropriate mood and affect. Musculoskeletal: Evaluation of the patient's bilateral lower extremities reveals no evidence of tenderness or limitations to range of motion.  Any attempts at motion around his shoulders is painful to the patient.  He has no tenderness to palpation over the bilateral distal upper arms, elbows, forearms, wrist or hands.  He is able to move his wrist and hands well on command as long as he is not moving his shoulders.  He is neurologically intact to the radial, median, and ulnar nerves distally.  His fingers are pink and warm with a brisk capillary refill time. Radiographic findings: Evaluation of the patient's left and right shoulder radiographs reveals comminuted proximal fractures involving the head and neck region of both shoulders.  The left humeral head is in a slightly subluxated position in relation to the glenoid but positioning may play a role in the images.   Labs:  Recent Labs    03/08/23 0620 03/09/23 0436  HGB 12.1* 9.9*   Recent  Labs    03/08/23 0620 03/09/23 0436  WBC 35.1* 34.7*  RBC 4.05* 3.39*  HCT 36.5* 29.7*  PLT 218 200   Recent Labs    03/08/23 1110 03/08/23 1353 03/09/23 0436 03/09/23 0927  NA 118*   < > 123* 127*  K 4.1  --  4.2  --   CL 93*  --  96*  --   CO2 18*  --  22  --   BUN 13  --  13  --   CREATININE 0.85  --  0.79  --   GLUCOSE 184*  --  148*  --   CALCIUM 8.1*  --  8.2*  --    < > = values in this interval not displayed.   No results for input(s): "LABPT", "INR" in the last 72 hours.   Assessment/Plan:    Assessment: 82 year old male admitted 03-08-2023 for altered mental status and sepsis.  Bilateral closed comminuted proximal humeral fractures.   Plan:  I discussed with the patient and his wife that certainly at this stage he has continued evaluation  and treatment for his altered mental status and sepsis would prevent any emergency surgery.  This a very unusual presentation to have bilateral comminuted proximal humeral fractures with no significant trauma.  Currently he had an episode of combativeness which is the most likely cause of his bilateral proximal humeral fractures.  We discussed that my associates will continue to closely follow the patient as I will be leaving town tomorrow morning.  At this stage she is certainly not a surgical candidate but once his overall medical condition is improved surgical intervention, especially on the left side, may be indicated.  Their questions were answered to their satisfaction and they understand that this is a very complicated clinical picture but surgery cannot be performed at this stage because of the concern of infection.  Cecil Cranker M.D. 03/09/2023 12:28 PM

## 2023-03-10 ENCOUNTER — Inpatient Hospital Stay: Payer: Medicare Other

## 2023-03-10 DIAGNOSIS — J189 Pneumonia, unspecified organism: Secondary | ICD-10-CM | POA: Diagnosis not present

## 2023-03-10 LAB — BASIC METABOLIC PANEL
Anion gap: 7 (ref 5–15)
BUN: 11 mg/dL (ref 8–23)
CO2: 22 mmol/L (ref 22–32)
Calcium: 8.4 mg/dL — ABNORMAL LOW (ref 8.9–10.3)
Chloride: 103 mmol/L (ref 98–111)
Creatinine, Ser: 0.75 mg/dL (ref 0.61–1.24)
GFR, Estimated: >60 mL/min (ref >60)
Glucose, Bld: 148 mg/dL — ABNORMAL HIGH (ref 70–99)
Potassium: 4 mmol/L (ref 3.5–5.1)
Sodium: 132 mmol/L — ABNORMAL LOW (ref 135–145)

## 2023-03-10 LAB — HEPATIC FUNCTION PANEL
ALT: 38 U/L (ref 0–44)
AST: 45 U/L — ABNORMAL HIGH (ref 15–41)
Albumin: 2.9 g/dL — ABNORMAL LOW (ref 3.5–5.0)
Alkaline Phosphatase: 50 U/L (ref 38–126)
Bilirubin, Direct: 0.2 mg/dL (ref 0.0–0.2)
Indirect Bilirubin: 0.6 mg/dL (ref 0.3–0.9)
Total Bilirubin: 0.8 mg/dL (ref 0.3–1.2)
Total Protein: 5.7 g/dL — ABNORMAL LOW (ref 6.5–8.1)

## 2023-03-10 LAB — CK: Total CK: 426 U/L — ABNORMAL HIGH (ref 49–397)

## 2023-03-10 LAB — CBC
HCT: 31.5 % — ABNORMAL LOW (ref 39.0–52.0)
Hemoglobin: 10.5 g/dL — ABNORMAL LOW (ref 13.0–17.0)
MCH: 29.5 pg (ref 26.0–34.0)
MCHC: 33.3 g/dL (ref 30.0–36.0)
MCV: 88.5 fL (ref 80.0–100.0)
Platelets: 243 10*3/uL (ref 150–400)
RBC: 3.56 MIL/uL — ABNORMAL LOW (ref 4.22–5.81)
RDW: 15.2 % (ref 11.5–15.5)
WBC: 29.7 10*3/uL — ABNORMAL HIGH (ref 4.0–10.5)
nRBC: 0 % (ref 0.0–0.2)

## 2023-03-10 LAB — GLUCOSE, CAPILLARY
Glucose-Capillary: 156 mg/dL — ABNORMAL HIGH (ref 70–99)
Glucose-Capillary: 200 mg/dL — ABNORMAL HIGH (ref 70–99)
Glucose-Capillary: 208 mg/dL — ABNORMAL HIGH (ref 70–99)

## 2023-03-10 LAB — PHOSPHORUS: Phosphorus: 1.8 mg/dL — ABNORMAL LOW (ref 2.5–4.6)

## 2023-03-10 LAB — PROCALCITONIN: Procalcitonin: 0.85 ng/mL

## 2023-03-10 LAB — SODIUM
Sodium: 130 mmol/L — ABNORMAL LOW (ref 135–145)
Sodium: 132 mmol/L — ABNORMAL LOW (ref 135–145)

## 2023-03-10 LAB — MAGNESIUM: Magnesium: 1.5 mg/dL — ABNORMAL LOW (ref 1.7–2.4)

## 2023-03-10 MED ORDER — TAMSULOSIN HCL 0.4 MG PO CAPS
0.4000 mg | ORAL_CAPSULE | Freq: Every day | ORAL | Status: DC
  Administered 2023-03-10 – 2023-03-18 (×8): 0.4 mg via ORAL
  Filled 2023-03-10 (×9): qty 1

## 2023-03-10 MED ORDER — FLUTICASONE FUROATE-VILANTEROL 200-25 MCG/ACT IN AEPB
1.0000 | INHALATION_SPRAY | Freq: Every day | RESPIRATORY_TRACT | Status: DC
  Administered 2023-03-10 – 2023-03-19 (×10): 1 via RESPIRATORY_TRACT
  Filled 2023-03-10: qty 28

## 2023-03-10 MED ORDER — POLYSACCHARIDE IRON COMPLEX 150 MG PO CAPS
150.0000 mg | ORAL_CAPSULE | Freq: Every day | ORAL | Status: DC
  Administered 2023-03-10 – 2023-03-20 (×9): 150 mg via ORAL
  Filled 2023-03-10 (×11): qty 1

## 2023-03-10 MED ORDER — K PHOS MONO-SOD PHOS DI & MONO 155-852-130 MG PO TABS
500.0000 mg | ORAL_TABLET | Freq: Two times a day (BID) | ORAL | Status: AC
  Administered 2023-03-10 (×2): 500 mg via ORAL
  Filled 2023-03-10 (×2): qty 2

## 2023-03-10 MED ORDER — PREDNISONE 20 MG PO TABS
40.0000 mg | ORAL_TABLET | Freq: Every day | ORAL | Status: AC
  Administered 2023-03-10 – 2023-03-12 (×3): 40 mg via ORAL
  Filled 2023-03-10: qty 2
  Filled 2023-03-10 (×2): qty 4

## 2023-03-10 MED ORDER — VITAMIN B-12 1000 MCG PO TABS
1000.0000 ug | ORAL_TABLET | Freq: Every day | ORAL | Status: DC
  Administered 2023-03-10 – 2023-03-20 (×10): 1000 ug via ORAL
  Filled 2023-03-10 (×10): qty 1

## 2023-03-10 MED ORDER — IPRATROPIUM-ALBUTEROL 0.5-2.5 (3) MG/3ML IN SOLN
3.0000 mL | Freq: Four times a day (QID) | RESPIRATORY_TRACT | Status: DC
Start: 2023-03-10 — End: 2023-03-10

## 2023-03-10 MED ORDER — IPRATROPIUM-ALBUTEROL 0.5-2.5 (3) MG/3ML IN SOLN
3.0000 mL | Freq: Three times a day (TID) | RESPIRATORY_TRACT | Status: DC
  Administered 2023-03-10 – 2023-03-12 (×7): 3 mL via RESPIRATORY_TRACT
  Filled 2023-03-10 (×7): qty 3

## 2023-03-10 MED ORDER — LISINOPRIL 20 MG PO TABS
20.0000 mg | ORAL_TABLET | Freq: Every day | ORAL | Status: DC
  Administered 2023-03-10 – 2023-03-16 (×6): 20 mg via ORAL
  Filled 2023-03-10: qty 4
  Filled 2023-03-10 (×2): qty 1
  Filled 2023-03-10: qty 4
  Filled 2023-03-10 (×2): qty 1

## 2023-03-10 MED ORDER — VITAMIN D (ERGOCALCIFEROL) 1.25 MG (50000 UNIT) PO CAPS
50000.0000 [IU] | ORAL_CAPSULE | ORAL | Status: DC
  Administered 2023-03-10: 50000 [IU] via ORAL
  Filled 2023-03-10 (×2): qty 1

## 2023-03-10 MED ORDER — VITAMIN C 500 MG PO TABS
500.0000 mg | ORAL_TABLET | Freq: Every day | ORAL | Status: DC
  Administered 2023-03-11 – 2023-03-20 (×9): 500 mg via ORAL
  Filled 2023-03-10 (×11): qty 1

## 2023-03-10 MED ORDER — PANTOPRAZOLE SODIUM 40 MG PO TBEC
40.0000 mg | DELAYED_RELEASE_TABLET | Freq: Every day | ORAL | Status: AC
  Administered 2023-03-15 – 2023-03-19 (×5): 40 mg via ORAL
  Filled 2023-03-10 (×5): qty 1

## 2023-03-10 MED ORDER — FOLIC ACID 1 MG PO TABS
1.0000 mg | ORAL_TABLET | Freq: Every day | ORAL | Status: DC
  Administered 2023-03-10 – 2023-03-20 (×10): 1 mg via ORAL
  Filled 2023-03-10 (×10): qty 1

## 2023-03-10 MED ORDER — PANTOPRAZOLE SODIUM 40 MG PO TBEC
40.0000 mg | DELAYED_RELEASE_TABLET | Freq: Two times a day (BID) | ORAL | Status: AC
  Administered 2023-03-10 – 2023-03-14 (×9): 40 mg via ORAL
  Filled 2023-03-10 (×9): qty 1

## 2023-03-10 MED ORDER — MAGNESIUM SULFATE 2 GM/50ML IV SOLN
2.0000 g | Freq: Once | INTRAVENOUS | Status: AC
  Administered 2023-03-10: 2 g via INTRAVENOUS
  Filled 2023-03-10: qty 50

## 2023-03-10 NOTE — Evaluation (Signed)
Physical Therapy Evaluation Patient Details Name: Philip Robbins MRN: [id number] DOB: 06-26-1941 Today's Date: 03/10/2023  History of Present Illness  Pt is an 82 y.o. male presenting to hospital 03/08/23 with c/o AMS and hypoxia; wife concerned pt was having a seizure.  Imaging showing: Comminuted fracture of left humeral head and neck (Posterior subluxation of the humeral head, with anterior displacement of the humeral neck); mildly displaced, comminuted fracture of the right humeral head and neck.  Per notes (prior to hospitalization) "Patient went for a left neck lymph node biopsy on Friday. Afterwards, patient started to have upper respiratory infection symptoms. Then next day, he started having urinary infection symptoms. Was taken to urgent care where he was diagnosed with urinary tract infection and started on levofloxacin. Patient last night became hypoxic on room air, febrile and possible seizure".  Pt admitted with severe sepsis, acute hyponatremia, question seizure, aspiration PNA, UTI.  PMH includes UTI, CLL under surveillance, BPH, htn, IIDM, hypothyroidism, HLD, chronic leukocytosis.  Clinical Impression  Prior to hospital admission, pt was independent with functional mobility and active; lives with his wife.  B shoulder pain 2/10 at rest but 5/10 during session.  Currently pt is mod assist to come to long-sitting in bed (from significantly elevated HOB) and then pt able to move B LE's towards edge of bed; min assist x2 progressing to CGA x2 for transfers; and CGA x2 to ambulate 120 feet.  Pt appearing very motivated to participate during session and improve mobility.  Pt would currently benefit from skilled PT to address noted impairments and functional limitations (see below for any additional details).  Upon hospital discharge, pt would benefit from ongoing therapy.     If plan is discharge home, recommend the following: A little help with walking and/or transfers;A little help with  bathing/dressing/bathroom;Assistance with cooking/housework;Assistance with feeding;Direct supervision/assist for medications management;Assist for transportation;Help with stairs or ramp for entrance   Can travel by private vehicle        Equipment Recommendations None recommended by PT  Recommendations for Other Services  OT consult    Functional Status Assessment Patient has had a recent decline in their functional status and demonstrates the ability to make significant improvements in function in a reasonable and predictable amount of time.     Precautions / Restrictions Precautions Precaution Comments: B UE's in shoulder slings; seizure precautions Required Braces or Orthoses: Sling Restrictions Weight Bearing Restrictions: Yes RUE Weight Bearing: Non weight bearing LUE Weight Bearing: Non weight bearing      Mobility  Bed Mobility Overal bed mobility: Needs Assistance Bed Mobility: Supine to Sit     Supine to sit: Mod assist, HOB elevated     General bed mobility comments: HOB significantly elevated; pt able to get into long sitting with assist for trunk and then move B LE's towards edge of bed; vc's for technique    Transfers Overall transfer level: Needs assistance Equipment used: None Transfers: Sit to/from Stand, Bed to chair/wheelchair/BSC Sit to Stand: Min assist, Contact guard assist, +2 physical assistance   Step pivot transfers: Contact guard assist, +2 physical assistance       General transfer comment: min assist x2 to stand from bed; CGA x2 to take steps bed to recliner; CGA x2 to stand from recliner; vc's for technique and to shift weight forward when standing (pt tending to be a little posterior on his heels)    Ambulation/Gait Ambulation/Gait assistance: Contact guard assist, +2 physical assistance (3rd assist for lines)  Gait Distance (Feet): 120 Feet Assistive device: None   Gait velocity: decreased     General Gait Details: decreased B  LE step length; mild increased B lateral sway; mild increased BOS  Stairs            Wheelchair Mobility     Tilt Bed    Modified Rankin (Stroke Patients Only)       Balance Overall balance assessment: Needs assistance Sitting-balance support: No upper extremity supported, Feet supported Sitting balance-Leahy Scale: Good Sitting balance - Comments: steady static sitting   Standing balance support: No upper extremity supported Standing balance-Leahy Scale: Fair Standing balance comment: pt with mild posterior weight shift in standing requiring cueing to correct                             Pertinent Vitals/Pain Pain Assessment Pain Assessment: 0-10 Pain Score: 5  Pain Location: B shoulder pain Pain Descriptors / Indicators: Aching, Tender Pain Intervention(s): Limited activity within patient's tolerance, Monitored during session, Premedicated before session Vitals stable and WFL throughout treatment session (on room air).    Home Living Family/patient expects to be discharged to:: Private residence Living Arrangements: Spouse/significant other Available Help at Discharge: Available PRN/intermittently Type of Home: House Home Access: Stairs to enter Entrance Stairs-Rails: Doctor, general practice of Steps: 4-5   Home Layout: One level Home Equipment: BSC/3in1      Prior Function Prior Level of Function : Independent/Modified Independent             Mobility Comments: No recent falls reported.  Active and independent.       Extremity/Trunk Assessment   Upper Extremity Assessment Upper Extremity Assessment: Defer to OT evaluation (B UE's in B shoulder slings)    Lower Extremity Assessment Lower Extremity Assessment: Generalized weakness    Cervical / Trunk Assessment Cervical / Trunk Assessment: Normal  Communication   Communication Communication: No apparent difficulties Cueing Techniques: Verbal cues;Visual cues   Cognition Arousal: Alert Behavior During Therapy: WFL for tasks assessed/performed Overall Cognitive Status: Within Functional Limits for tasks assessed                                          General Comments  Nursing cleared pt for participation in physical therapy and present assisting during session.  Pt agreeable to PT session.  Pt's wife and daughter present during session.    Exercises     Assessment/Plan    PT Assessment Patient needs continued PT services  PT Problem List Decreased strength;Decreased balance;Decreased mobility;Decreased knowledge of precautions;Pain       PT Treatment Interventions DME instruction;Gait training;Stair training;Functional mobility training;Therapeutic activities;Therapeutic exercise;Balance training;Patient/family education    PT Goals (Current goals can be found in the Care Plan section)  Acute Rehab PT Goals Patient Stated Goal: to improve mobility PT Goal Formulation: With patient Time For Goal Achievement: 03/24/23 Potential to Achieve Goals: Good    Frequency Min 1X/week     Co-evaluation               AM-PAC PT "6 Clicks" Mobility  Outcome Measure Help needed turning from your back to your side while in a flat bed without using bedrails?: A Lot Help needed moving from lying on your back to sitting on the side of a flat bed without using bedrails?: A Lot Help  needed moving to and from a bed to a chair (including a wheelchair)?: A Little Help needed standing up from a chair using your arms (e.g., wheelchair or bedside chair)?: A Little Help needed to walk in hospital room?: A Little Help needed climbing 3-5 steps with a railing? : A Lot 6 Click Score: 15    End of Session Equipment Utilized During Treatment: Gait belt Activity Tolerance: Patient tolerated treatment well Patient left: in chair;with call bell/phone within reach;with family/visitor present Nurse Communication: Mobility  status;Precautions;Other (comment);Weight bearing status (pt's pain status) PT Visit Diagnosis: Unsteadiness on feet (R26.81);Other abnormalities of gait and mobility (R26.89);Muscle weakness (generalized) (M62.81);Pain Pain - Right/Left:  (B) Pain - part of body: Shoulder    Time: 1610-9604 PT Time Calculation (min) (ACUTE ONLY): 28 min   Charges:   PT Evaluation $PT Eval Low Complexity: 1 Low PT Treatments $Therapeutic Activity: 8-22 mins PT General Charges $$ ACUTE PT VISIT: 1 Visit        Hendricks Limes, PT 03/10/23, 5:41 PM

## 2023-03-10 NOTE — Progress Notes (Signed)
[facility]  ROUNDING NOTE   Subjective:   Na 132 - hypertonic saline discontinued  Wife and daughter at bedside.   UOP 2000  Urine and blood cultures with no growth.   Denies any shoulder pain but wants to discuss his bilateral shoulder fractures. Deemed currently not a surgical candidate due to infection.   Objective:  Vital signs in last 24 hours:  Temp:  [98.2 F (36.8 C)-100 F (37.8 C)] 98.7 F (37.1 C) (08/27 0800) Pulse Rate:  [76-97] 84 (08/27 1200) Resp:  [13-36] 20 (08/27 1200) BP: (122-152)/(51-77) 140/58 (08/27 1200) SpO2:  [90 %-97 %] 90 % (08/27 1319) Weight:  [103.3 kg] 103.3 kg (08/27 0400)  Weight change: -2.6 kg Filed Weights   03/08/23 0610 03/08/23 1314 03/10/23 0400  Weight: 99.8 kg 105.9 kg 103.3 kg    Intake/Output: I/O last 3 completed shifts: In: 1172.3 [P.O.:150; I.V.:922.3; IV Piggyback:100] Out: 2500 [Urine:2500]   Intake/Output this shift:  No intake/output data recorded.  Physical Exam: General: NAD, laying in bed  Head: Normocephalic, atraumatic. Moist oral mucosal membranes  Eyes: Anicteric, PERRL  Neck: Supple, trachea midline  Lungs:  Clear to auscultation  Heart: Regular rate and rhythm  Abdomen:  Soft, nontender, obese  Extremities:  no peripheral edema.  Neurologic: Nonfocal, moving all four extremities  Skin: No lesions  Access: none    Basic Metabolic Panel: Recent Labs  Lab 03/08/23 0620 03/08/23 1110 03/08/23 1353 03/09/23 0436 03/09/23 0927 03/09/23 1606 03/09/23 2011 03/10/23 0018 03/10/23 0430 03/10/23 0918  NA 118* 118*   < > 123*   < > 125* 127* 130* 132* 132*  K 4.1 4.1  --  4.2  --   --   --   --  4.0  --   CL 92* 93*  --  96*  --   --   --   --  103  --   CO2 12* 18*  --  22  --   --   --   --  22  --   GLUCOSE 201* 184*  --  148*  --   --   --   --  148*  --   BUN 17 13  --  13  --   --   --   --  11  --   CREATININE 1.13 0.85  --  0.79  --   --   --   --  0.75  --   CALCIUM  8.3* 8.1*  --  8.2*  --   --   --   --  8.4*  --   MG  --   --   --  1.7  --   --   --   --  1.5*  --   PHOS  --   --   --  2.2*  --   --   --   --  1.8*  --    < > = values in this interval not displayed.    Liver Function Tests: Recent Labs  Lab 03/08/23 0620 03/10/23 0430  AST 67* 45*  ALT 46* 38  ALKPHOS 43 50  BILITOT 2.1* 0.8  PROT 6.2* 5.7*  ALBUMIN 3.4* 2.9*   No results for input(s): "LIPASE", "AMYLASE" in the last 168 hours. No results for input(s): "AMMONIA" in the last 168 hours.  CBC: Recent Labs  Lab 03/08/23 0620 03/09/23 0436 03/10/23 0430  WBC 35.1* 34.7* 29.7*  NEUTROABS 19.8*  --   --  HGB 12.1* 9.9* 10.5*  HCT 36.5* 29.7* 31.5*  MCV 90.1 87.6 88.5  PLT 218 200 243    Cardiac Enzymes: Recent Labs  Lab 03/08/23 1110 03/09/23 0436 03/10/23 0430  CKTOTAL 623* 635* 426*    BNP: Invalid input(s): "POCBNP"  CBG: Recent Labs  Lab 03/09/23 0822 03/09/23 1147 03/09/23 1541 03/09/23 2120 03/10/23 0732  GLUCAP 143* 142* 159* 162* 156*    Microbiology: Results for orders placed or performed during the hospital encounter of 03/08/23  Culture, blood (routine x 2)     Status: None (Preliminary result)   Collection Time: 03/08/23  6:15 AM   Specimen: BLOOD  Result Value Ref Range Status   Specimen Description BLOOD LEFT ANTECUBITAL  Final   Special Requests   Final    BOTTLES DRAWN AEROBIC AND ANAEROBIC Blood Culture adequate volume   Culture   Final    NO GROWTH 2 DAYS Performed at [facility], [address] [place], [place] [address]    Report Status PENDING  Incomplete  Culture, blood (routine x 2)     Status: None (Preliminary result)   Collection Time: 03/08/23  6:20 AM   Specimen: BLOOD  Result Value Ref Range Status   Specimen Description BLOOD RIGHT ANTECUBITAL  Final   Special Requests   Final    BOTTLES DRAWN AEROBIC AND ANAEROBIC Blood Culture adequate volume   Culture   Final    NO GROWTH 2 DAYS Performed at  [facility], [address] [place], [place] [address]    Report Status PENDING  Incomplete  Resp panel by RT-PCR (RSV, Flu A&B, Covid) Anterior Nasal Swab     Status: None   Collection Time: 03/08/23  6:28 AM   Specimen: Anterior Nasal Swab  Result Value Ref Range Status   SARS Coronavirus 2 by RT PCR NEGATIVE NEGATIVE Final    Comment: (NOTE) SARS-CoV-2 target nucleic acids are NOT DETECTED.  The SARS-CoV-2 RNA is generally detectable in upper respiratory specimens during the acute phase of infection. The lowest concentration of SARS-CoV-2 viral copies this assay can detect is 138 copies/mL. A negative result does not preclude SARS-Cov-2 infection and should not be used as the sole basis for treatment or other patient management decisions. A negative result may occur with  improper specimen collection/handling, submission of specimen other than nasopharyngeal swab, presence of viral mutation(s) within the areas targeted by this assay, and inadequate number of viral copies(<138 copies/mL). A negative result must be combined with clinical observations, patient history, and epidemiological information. The expected result is Negative.  Fact Sheet for Patients:  BloggerCourse.com  Fact Sheet for Healthcare Providers:  SeriousBroker.it  This test is no t yet approved or cleared by the [place] FDA and  has been authorized for detection and/or diagnosis of SARS-CoV-2 by FDA under an Emergency Use Authorization (EUA). This EUA will remain  in effect (meaning this test can be used) for the duration of the COVID-19 declaration under Section 564(b)(1) of the Act, 21 U.S.C.section 360bbb-3(b)(1), unless the authorization is terminated  or revoked sooner.       Influenza A by PCR NEGATIVE NEGATIVE Final   Influenza B by PCR NEGATIVE NEGATIVE Final    Comment: (NOTE) The Xpert Xpress SARS-CoV-2/FLU/RSV plus assay is  intended as an aid in the diagnosis of influenza from Nasopharyngeal swab specimens and should not be used as a sole basis for treatment. Nasal washings and aspirates are unacceptable for Xpert Xpress SARS-CoV-2/FLU/RSV testing.  Fact Sheet for Patients: BloggerCourse.com  Fact Sheet for Healthcare Providers: SeriousBroker.it  This test is not yet approved or cleared by the [place] FDA and has been authorized for detection and/or diagnosis of SARS-CoV-2 by FDA under an Emergency Use Authorization (EUA). This EUA will remain in effect (meaning this test can be used) for the duration of the COVID-19 declaration under Section 564(b)(1) of the Act, 21 U.S.C. section 360bbb-3(b)(1), unless the authorization is terminated or revoked.     Resp Syncytial Virus by PCR NEGATIVE NEGATIVE Final    Comment: (NOTE) Fact Sheet for Patients: BloggerCourse.com  Fact Sheet for Healthcare Providers: SeriousBroker.it  This test is not yet approved or cleared by the [place] FDA and has been authorized for detection and/or diagnosis of SARS-CoV-2 by FDA under an Emergency Use Authorization (EUA). This EUA will remain in effect (meaning this test can be used) for the duration of the COVID-19 declaration under Section 564(b)(1) of the Act, 21 U.S.C. section 360bbb-3(b)(1), unless the authorization is terminated or revoked.  Performed at [facility], [address] [place], [place] [address]   Urine Culture     Status: None   Collection Time: 03/08/23  9:55 AM   Specimen: Urine, Random  Result Value Ref Range Status   Specimen Description   Final    URINE, RANDOM Performed at [facility], [address] [place], [place] [address]    Special Requests   Final    NONE Reflexed from [id number] Performed at [facility], [address], [place], [place]  [address]    Culture   Final    NO GROWTH Performed at [facility], [address], [place], [place] [address]    Report Status 03/09/2023 FINAL  Final  MRSA Next Gen by PCR, Nasal     Status: None   Collection Time: 03/08/23  1:21 PM   Specimen: Nasal Mucosa; Nasal Swab  Result Value Ref Range Status   MRSA by PCR Next Gen NOT DETECTED NOT DETECTED Final    Comment: (NOTE) The GeneXpert MRSA Assay (FDA approved for NASAL specimens only), is one component of a comprehensive MRSA colonization surveillance program. It is not intended to diagnose MRSA infection nor to guide or monitor treatment for MRSA infections. Test performance is not FDA approved in patients less than 89 years old. Performed at [facility], [address] [place], [place] [address]     Coagulation Studies: No results for input(s): "LABPROT", "INR" in the last 72 hours.  Urinalysis: Recent Labs    03/08/23 0955  COLORURINE YELLOW*  LABSPEC >1.046*  PHURINE 5.0  GLUCOSEU 150*  HGBUR LARGE*  BILIRUBINUR NEGATIVE  KETONESUR 20*  PROTEINUR 30*  NITRITE NEGATIVE  LEUKOCYTESUR LARGE*      Imaging: US RENAL  Result Date: 03/10/2023 CLINICAL DATA:  UTI. EXAM: RENAL / URINARY TRACT ULTRASOUND COMPLETE COMPARISON:  None Available. FINDINGS: Right Kidney: Renal measurements: 11.5 cm x 6.3 cm x 6.0 cm = volume: 228.6 mL. Echogenicity within normal limits. No mass or hydronephrosis visualized. Left Kidney: Renal measurements: 12.9 cm x 6.8 cm x 5.3 cm = volume: 239.3 mL. Echogenicity within normal limits. No mass or hydronephrosis visualized. Bladder: Appears normal for degree of bladder distention. The bilateral ureteral jets are visualized. Other: None. IMPRESSION: Unremarkable renal ultrasound. Electronically Signed   By: Aram Candela M.D.   On: 03/10/2023 01:36     Medications:    cefTRIAXone (ROCEPHIN)  IV 2 g (  03/10/23 1007)    azithromycin  500 mg Oral Daily   Chlorhexidine  Gluconate Cloth  6 each Topical Q0600   diclofenac Sodium  2 g Topical QID   enoxaparin (LOVENOX) injection  0.5 mg/kg Subcutaneous Q24H   fluticasone furoate-vilanterol  1 puff Inhalation Daily   guaiFENesin  600 mg Oral BID   insulin aspart  0-20 Units Subcutaneous TID WC   ipratropium-albuterol  3 mL Nebulization TID   levothyroxine  50 mcg Oral Q0600   lisinopril  20 mg Oral Daily   oxymetazoline  1 [place] Each Nare BID   pantoprazole  40 mg Oral BID   Followed by   Melene Muller ON 03/15/2023] pantoprazole  40 mg Oral Daily   phosphorus  500 mg Oral BID WC   predniSONE  40 mg Oral Q breakfast   tamsulosin  0.4 mg Oral QPC supper   acetaminophen, chlorpheniramine-HYDROcodone, hydrALAZINE, HYDROmorphone (DILAUDID) injection, ketorolac, ondansetron **OR** ondansetron (ZOFRAN) IV  Assessment/ Plan:  Mr. Philip Robbins is a 82 y.o.  male with CLL, BPH, hypertension, diabetes mellitus type II, hypothyroidism, and hyperlipidemia who was admitted to [facility] on 03/08/2023 for Pneumonia [J18.9] Sepsis (HCC) [A41.9]   Hyponatremia: acute on chronic. Hypo-osmolar. Hypovolemic to euvolemic on examination. With neurologic symptoms of seizure like activity.  - stop hypertonic saline - monitor serum sodium levels.   Urinary tract infection with septic shock - empiric antibiotics: ceftriaxone and azithromycin.    Diabetes mellitus type II with renal manifestations - holding metformin  Hypertension: restarted home tamsulosin and lisinopril.     LOS: 2 Casimer Russett 8/27/20241:57 PM

## 2023-03-10 NOTE — Consult Note (Signed)
Pharmacy Consulted for monitoring of Hypertonic Saline  Philip Robbins is a 82 year old M admited on 8/25 for UTI, hypoxia, hypotension and hyponatremia. Patient has a history of runny nose, sore throat and a strong burning sensation while urinating with dark colored urine. Also experienced generalized malaise. They were found this morning with large vomitus in bed with a foaming mouth. Some twitching movement was observed concerning for seizure. Likely hypovolemic secondary to repeated vomiting overnight and sepsis. Pharmacy was consulted for the monitoring of hypertonic saline.   MIVF:  NaCL 3% @25 /h (pending start 8/25 @ 1245) - per nephrology   Date/Time  Sodium Level  Rate of Infusion  8/25 at 1110  118    8/25 at 1353  120  3% NaCl at 25 mL/hr started at 1436  8/25 at 1740  120    8/25 at 2006  120   8/26 at 0005 122   8/26 at 0436 123   8/26 at 0927 127   8/26 at 1154 127   8/27 at 0018 130   8/27 at 0430  132    Goal of Therapy:  Goal of correction 8 mmol per 24 hours IF sodium rises > 4 mEq/L over 2 hours   > 6 mEq/L over 4 hours - Pharmacy will evaluate and contact MD per consult.  Plan:  Continue 3% NaCl infusion at 25 mL/hr; goal Na by tomorrow AM ~ 131 Na checks q4h Pharmacy will continue to monitor   Thank you for involving pharmacy in this patient's care.   Philip Robbins 03/10/2023 5:12 AM

## 2023-03-10 NOTE — Plan of Care (Signed)
  Problem: Activity: Goal: Ability to tolerate increased activity will improve Outcome: Progressing   Problem: Clinical Measurements: Goal: Ability to maintain a body temperature in the normal range will improve Outcome: Progressing   Problem: Respiratory: Goal: Ability to maintain adequate ventilation will improve Outcome: Progressing Goal: Ability to maintain a clear airway will improve Outcome: Progressing   Problem: Coping: Goal: Ability to adjust to condition or change in health will improve Outcome: Progressing   Problem: Fluid Volume: Goal: Ability to maintain a balanced intake and output will improve Outcome: Progressing   Problem: Health Behavior/Discharge Planning: Goal: Ability to identify and utilize available resources and services will improve Outcome: Progressing Goal: Ability to manage health-related needs will improve Outcome: Progressing   Problem: Metabolic: Goal: Ability to maintain appropriate glucose levels will improve Outcome: Progressing   Problem: Nutritional: Goal: Maintenance of adequate nutrition will improve Outcome: Progressing Goal: Progress toward achieving an optimal weight will improve Outcome: Progressing   Problem: Skin Integrity: Goal: Risk for impaired skin integrity will decrease Outcome: Progressing

## 2023-03-10 NOTE — Progress Notes (Addendum)
0800 Awake and pleasant. Confused and verbal responses delayed. Does not complain of any shoulder pain. MAE except limited movement of shoulders. Unsure of day or time but knows month,name,birthday etc. Bruising noted on bilateral  upper back. Patient does not remember any of his previous hospitalization or ambulance experences. Daughter in room with patient. 1000 Taking oral medications without issues. Swallows well. 1130 3% sodium drip stopped per orders.1520 Bilateral slings placed for comfort and to get patient up to chair. 1545 Up with PT. Walked 120 feet with both arms in slings.Then placed in chair. Oxygen saturation up to 94% on room air. 1700 Back in bed with help. 1710 Down to CT for left shoulder CT 1755 Back in room. Positioned for comfort.

## 2023-03-10 NOTE — Consult Note (Signed)
Pharmacy Consulted for monitoring of Hypertonic Saline  Lorenz Kerstetter is a 82 year old M admited on 8/25 for UTI, hypoxia, hypotension and hyponatremia. Patient has a history of runny nose, sore throat and a strong burning sensation while urinating with dark colored urine. Also experienced generalized malaise. They were found this morning with large vomitus in bed with a foaming mouth. Some twitching movement was observed concerning for seizure. Likely hypovolemic secondary to repeated vomiting overnight and sepsis. Pharmacy was consulted for the monitoring of hypertonic saline.   MIVF:  NaCL 3% @25 /h (pending start 8/25 @ 1245) - per nephrology   Date/Time  Sodium Level  Rate of Infusion  8/25 at 1110  118    8/25 at 1353  120  3% NaCl at 25 mL/hr started at 1436  8/25 at 1740  120    8/25 at 2006  120   8/26 at 0005 122   8/26 at 0436 123   8/26 at 0927 127   8/26 at 1154 127   8/27 at 0018 130    Goal of Therapy:  Goal of correction 8 mmol per 24 hours IF sodium rises > 4 mEq/L over 2 hours   > 6 mEq/L over 4 hours - Pharmacy will evaluate and contact MD per consult.  Plan:  Continue 3% NaCl infusion at 25 mL/hr; goal Na by tomorrow AM ~ 131 Na checks q4h Pharmacy will continue to monitor   Thank you for involving pharmacy in this patient's care.   Zafirah Vanzee D 03/10/2023 12:47 AM

## 2023-03-10 NOTE — Consult Note (Signed)
PHARMACY CONSULT NOTE - ELECTROLYTES  Pharmacy Consult for Electrolyte Monitoring and Replacement   Recent Labs: Potassium (mmol/L)  Date Value  03/10/2023 4.0   Magnesium (mg/dL)  Date Value  34/74/2595 1.5 (L)   Calcium (mg/dL)  Date Value  63/87/5643 8.4 (L)   Albumin (g/dL)  Date Value  32/95/1884 2.9 (L)   Phosphorus (mg/dL)  Date Value  16/60/6301 1.8 (L)   Sodium (mmol/L)  Date Value  03/10/2023 132 (L)   Height: 5\' 10"  (177.8 cm) Weight: 103.3 kg (227 lb 11.8 oz) IBW/kg (Calculated) : 73 Estimated Creatinine Clearance: 88.6 mL/min (by C-G formula based on SCr of 0.75 mg/dL).  Assessment  Philip Robbins is a 82 y.o. male presenting with pneumonia and hyponatremia. PMH significant for CLL, BPH, HTN, DM, hypothyroidism, HLD. Pharmacy has been consulted to monitor and replace electrolytes.  Diet: Heart healthy MIVF: N/A Pertinent medications: N/A  Goal of Therapy: Electrolytes within normal limits  Plan:  Mg 1.5, magnesium sulfate 2 g IV x 1 Phos 1.8, K Phos Neutral 2 tablets x 2 doses Follow-up electrolytes with AM labs tomorrow  Thank you for allowing pharmacy to be a part of this patient's care.  Tressie Ellis 03/10/2023 8:42 AM

## 2023-03-10 NOTE — Progress Notes (Signed)
[facility] Progress Note  Patient: Philip Robbins    ZOX:[id number]  DOA: 03/08/2023     Date of Service: the patient was seen and examined on 03/10/2023  Chief Complaint  Patient presents with   Fever   Brief hospital course:  Philip Robbins is a 82 y.o. male with medical history significant of CLL under surveillance, BPH, HTN, IIDM, hypothyroidism, HLD, chronic leukocytosis presented with altered mentations hypoxia.   His symptoms started last Friday, patient went to have a left neck lymph node biopsy, after the procedure patient developed URI-like symptoms with runny nose sore throat, yesterday, patient developed strong burning sensation in the urine and dark-colored urine and went to see urgent care was diagnosed with UTI and started on antibiotics.  Patient continued to feel malaise generalized weakness, nauseous but no vomiting and no abdominal pain overnight and this morning patient was found in the bed unresponsive with large vomitus on his bed and foams in his mouth.  Family called EMS, EMS arrived and found patient hypoxic O2 saturation 95% on room air and patient was placed on 15 L NRB and sent to ED.  Fever 101.8.  Wife also witnessed patient had some twitching movement of his arms and suspect whether he had seizure.  At baseline patient has BPH appears to be poorly controlled, he reported Brueggen screams and frequent nocturnal urination but no history of UTI before.   ED Course: Fever 101.2, tachycardia hypotensive oxygen saturation 91% on 6 L.  Chest x-ray showed bilateral infiltrates indicating for pneumonia.  WBC 35.1.  Hemoglobin 12, sodium 118, K4.1, bicarb 12, creatinine 1.1, troponin 64> 99, AST 67, ALT 46   Patient was given 3 L IV bolus and started on ceftriaxone, azithromycin Assessment and Plan:  Sepsis, severe -Evidenced by new onset of hypoxia, elevated temperature and elevated lactic acid with signs of endorgan damage of acute encephalopathy and source of  infection likely aspiration pneumonia and UTI -s/p IVF given as per protocol. -Continue ceftriaxone azithromycin to cover pneumonia and UTI  Acute hypoxic respiratory failure secondary to pneumonia and COPD/asthma exacerbation  Patient does not have diagnosis of COPD or asthma Patient is having wheezing Continue above antibiotics for pneumonia 8/27  prednisone 40 mg p.o. daily for 3 days Started Breo Ellipta inhaler and DuoNeb TID Continue supplemental O2 admission and gradually wean off  Aspiration pneumonia -Likely secondary to repeated vomiting last night, management as above  UTI -Likely secondary to uncontrolled BPH, antibiotics -Check PVR, outpatient follow-up with urology for urodynamic study -Continue Flomax  acute hypotonic hyponatremia -Hypovolemic, likely secondary to acute GI loss from repeated vomiting overnight and sepsis Serum osmolality 257 low, TSH 2.6 within normal range Nephro consulted, s/p saline 3% IVF discontinued on 8/27 Na 118------132 Sodium level gradually improving, continue to monitor. Continue fluid restriction 1.5 L/day   # Hypophosphatemia, Phos repleted. # Hypomagnesemia, mag repleted. Monitor electrolytes daily.  And replete as needed  # B/L Humral fracture No h/o fall, possible trauma while transportation as pt was combative and he was held strongly by EMS Orthopedics consulted, recommended no intervention at this time due to acute illness, may need surgery for left shoulder when patient improves.  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  Repeat sodium level and likely will start hypertonic saline, consult nephrology. -Seizure precaution.   Troponin elevation -No chest pains, EKG showed no acute  ST changes and the poor R wave progression appears to be chronic. -Etiology likely related to sepsis and acute  hypoxia and demanding ischemia.  Treat sepsis, repeat troponin this evening.   CLL with chronic leukocytosis -Peripheral smear is being reviewed to rule out acute transformation -Management of sepsis as above   HTN Blood pressure improved 8/27 resumed lisinopril 20 mg p.o. 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 weekly, follow with PCP to repeat vitamin D level after 3 to 6 months.  Vitamin B12 deficiency: Started vitamin B12 1000 mcg oral supplement, avoided IM injection due to bilateral humeral fractures Follow-up PCP to repeat vitamin B12 level after 3 to 6 months.  Iron deficiency, transferrin saturation 6%, started oral iron supplement.  Avoided IV iron due to active infection. Follow-up with PCP to repeat iron profile after 3 to 6 months Folic acid level 8.3, at lower end, started oral supplement for 3 months.   Body mass index is 32.68 kg/m.  Interventions:  Diet: Heart healthy diet DVT Prophylaxis: Subcutaneous Lovenox   Advance goals of care discussion: Full code  Family Communication: family was present at bedside, at the time of interview.  The pt provided permission to discuss medical plan with the family. Opportunity was given to ask question and all questions were answered satisfactorily.   Disposition:  Pt is from Home, admitted with hyponatremia, pneumonia, respiratory failure, UTI, b/l.  Humerus fracture, still has resp failure and low sodium, which precludes a safe discharge. Discharge to SNF , when medically stable.  Subjective: No significant overnight events.  Patient stated that no pain at rest but it hurts more 8/10 on movement.  Patient is still having some shortness of breath, denied worsening, no chest pain or palpitations, no any other active  issues.  Physical Exam: General: NAD, lying comfortably Appear in no distress, affect appropriate Eyes: PERRLA ENT: Oral Mucosa Clear, moist  Neck: no JVD,  Cardiovascular: S1 and S2 Present, no Murmur,  Respiratory: Good air entry bilaterally, b/l  crackles and mild wheezing. Abdomen: Bowel Sound present, Soft and no tenderness,  Skin: no rashes Extremities: no Pedal edema, no calf tenderness Neurologic: without any new focal findings Gait not checked due to patient safety concerns  Vitals:   03/10/23 1300 03/10/23 1319 03/10/23 1400 03/10/23 1500  BP: (!) 148/57  (!) 142/48 (!) 144/53  Pulse: 88  99 90  Resp: (!) 28  (!) 24 (!) 29  Temp:      TempSrc:      SpO2: 90% 90% 93% 95%  Weight:      Height:        Intake/Output Summary (Last 24 hours) at 03/10/2023 1603 Last data filed at 03/10/2023 0700 Gross per 24 hour  Intake 524.12 ml  Output 2000 ml  Net -1475.88 ml   Filed Weights   03/08/23 0610 03/08/23 1314 03/10/23 0400  Weight: 99.8 kg 105.9 kg 103.3 kg    Data Reviewed: I have personally reviewed and interpreted daily labs, tele strips, imagings as discussed above. I reviewed all nursing notes, pharmacy notes, vitals, pertinent old records I have discussed plan of care as described above with RN and patient/family.  CBC: Recent Labs  Lab 03/08/23 0620 03/09/23 0436 03/10/23 0430  WBC 35.1* 34.7* 29.7*  NEUTROABS 19.8*  --   --  HGB 12.1* 9.9* 10.5*  HCT 36.5* 29.7* 31.5*  MCV 90.1 87.6 88.5  PLT 218 200 243   Basic Metabolic Panel: Recent Labs  Lab 03/08/23 0620 03/08/23 1110 03/08/23 1353 03/09/23 0436 03/09/23 0927 03/09/23 1606 03/09/23 2011 03/10/23 0018 03/10/23 0430 03/10/23 0918  NA 118* 118*   < > 123*   < > 125* 127* 130* 132* 132*  K 4.1 4.1  --  4.2  --   --   --   --  4.0  --   CL 92* 93*  --  96*  --   --   --   --  103  --   CO2 12* 18*  --  22  --   --   --   --  22  --   GLUCOSE 201* 184*  --  148*  --   --   --   --   148*  --   BUN 17 13  --  13  --   --   --   --  11  --   CREATININE 1.13 0.85  --  0.79  --   --   --   --  0.75  --   CALCIUM 8.3* 8.1*  --  8.2*  --   --   --   --  8.4*  --   MG  --   --   --  1.7  --   --   --   --  1.5*  --   PHOS  --   --   --  2.2*  --   --   --   --  1.8*  --    < > = values in this interval not displayed.    Studies: US RENAL  Result Date: 03/10/2023 CLINICAL DATA:  UTI. EXAM: RENAL / URINARY TRACT ULTRASOUND COMPLETE COMPARISON:  None Available. FINDINGS: Right Kidney: Renal measurements: 11.5 cm x 6.3 cm x 6.0 cm = volume: 228.6 mL. Echogenicity within normal limits. No mass or hydronephrosis visualized. Left Kidney: Renal measurements: 12.9 cm x 6.8 cm x 5.3 cm = volume: 239.3 mL. Echogenicity within normal limits. No mass or hydronephrosis visualized. Bladder: Appears normal for degree of bladder distention. The bilateral ureteral jets are visualized. Other: None. IMPRESSION: Unremarkable renal ultrasound. Electronically Signed   By: Aram Candela M.D.   On: 03/10/2023 01:36    Scheduled Meds:  azithromycin  500 mg Oral Daily   Chlorhexidine Gluconate Cloth  6 each Topical Q0600   diclofenac Sodium  2 g Topical QID   enoxaparin (LOVENOX) injection  0.5 mg/kg Subcutaneous Q24H   fluticasone furoate-vilanterol  1 puff Inhalation Daily   guaiFENesin  600 mg Oral BID   insulin aspart  0-20 Units Subcutaneous TID WC   ipratropium-albuterol  3 mL Nebulization TID   levothyroxine  50 mcg Oral Q0600   lisinopril  20 mg Oral Daily   oxymetazoline  1 [place] Each Nare BID   pantoprazole  40 mg Oral BID   Followed by   Melene Muller ON 03/15/2023] pantoprazole  40 mg Oral Daily   phosphorus  500 mg Oral BID WC   predniSONE  40 mg Oral Q breakfast   tamsulosin  0.4 mg Oral QPC supper   Continuous Infusions:  cefTRIAXone (ROCEPHIN)  IV 2 g (03/10/23 1007)   PRN Meds: acetaminophen, chlorpheniramine-HYDROcodone, hydrALAZINE, HYDROmorphone (DILAUDID) injection, ketorolac,  ondansetron **OR** ondansetron (ZOFRAN) IV  Time spent: 40 minutes  Author:  Shakala Marlatt. MD [facility] 03/10/2023 4:03 PM  To reach On-call, see care teams to locate the attending and reach out to them via [email or web address]. If 7PM-7AM, please contact night-coverage If you still have difficulty reaching the attending provider, please page the [facility] (Director on Call) for [facility] on amion for assistance.

## 2023-03-10 NOTE — Consult Note (Addendum)
ORTHOPAEDIC CONSULTATION  REQUESTING PHYSICIAN: Gillis Santa, MD  Chief Complaint:   Bilateral shoulder pain  History of Present Illness: ADRAIN GIACOMO is a 82 y.o. right-hand dominant male with bilateral L>R shoulder pain. He has a PMHx significant for CLL under surveillance, Type 2 DM, HLD, HTN, hypothyroidism, BPH, and chronic leukocytosis. He underwent lymph node biopsy on 03/06/23 and then developed URI type symptoms. He was also having urinary symptoms and was started on antibiotics for UTI. He was found unresponsive in bed with a fever, and there was a questionable seizure. His wife, at bedside today, reports that he was combative with EMS and may have injured both shoulders at that time.   Since admission on 03/08/23, he has been treated for sepsis, AMS, UTI, possible pneumonia, and hyponatremia. Imaging showed a right proximal humerus fracture with minimal displacement of the greater tuberosity and surgical neck and a 4-part proximal humerus fracture with possible dislocation of the humeral head.  Since admission, his mental status has improved. He lives at home with his wife. He is a retired Education officer, environmental. He usually is sitting, typing, and writing throughout the day.   Past Medical History:  Diagnosis Date   UTI (urinary tract infection)     Social History   Socioeconomic History   Marital status: Married    Spouse name: Not on file   Number of children: Not on file   Years of education: Not on file   Highest education level: Not on file  Occupational History   Not on file  Tobacco Use   Smoking status: Not on file   Smokeless tobacco: Not on file  Substance and Sexual Activity   Alcohol use: Not on file   Drug use: Not on file   Sexual activity: Not on file  Other Topics Concern   Not on file  Social History Narrative   Not on file   Social Determinants of Health   Financial Resource Strain: Not on file   Food Insecurity: No Food Insecurity (03/10/2023)   Hunger Vital Sign    Worried About Running Out of Food in the Last Year: Never true    Ran Out of Food in the Last Year: Never true  Transportation Needs: No Transportation Needs (03/10/2023)   PRAPARE - Administrator, Civil Service (Medical): No    Lack of Transportation (Non-Medical): No  Physical Activity: Not on file  Stress: Not on file  Social Connections: Not on file   No family history on file. No Known Allergies Prior to Admission medications   Medication Sig Start Date End Date Taking? Authorizing Provider  atorvastatin (LIPITOR) 40 MG tablet Take 40 mg by mouth daily.   Yes [provider]  fluticasone (FLONASE) 50 MCG/ACT nasal Donati Place 2 sprays into both nostrils every evening.   Yes [provider]  levofloxacin (LEVAQUIN) 750 MG tablet Take 750 mg by mouth daily.   Yes [provider]  levothyroxine (SYNTHROID) 75 MCG tablet Take 75 mcg by mouth daily before breakfast.   Yes [provider]  lisinopril (ZESTRIL) 10 MG tablet Take 10 mg by mouth daily.   Yes [provider]  metFORMIN (GLUCOPHAGE) 1000 MG tablet Take 1,000 mg by mouth 2 (two) times daily with a meal.   Yes [provider]  pioglitazone (ACTOS) 45 MG tablet Take 45 mg by mouth daily.   Yes [provider]  sitaGLIPtin (JANUVIA) 50 MG tablet Take 50 mg by mouth daily.  Yes [provider]  tamsulosin (FLOMAX) 0.4 MG CAPS capsule Take 0.4 mg by mouth daily.   Yes [provider]   Recent Labs    03/08/23 4098 03/08/23 1110 03/09/23 0436 03/10/23 0430  WBC 35.1*  --  34.7* 29.7*  HGB 12.1*  --  9.9* 10.5*  HCT 36.5*  --  29.7* 31.5*  PLT 218  --  200 243  K 4.1   < > 4.2 4.0  CL 92*   < > 96* 103  CO2 12*   < > 22 22  BUN 17   < > 13 11  CREATININE 1.13   < > 0.79 0.75  GLUCOSE 201*   < > 148* 148*  CALCIUM 8.3*   < > 8.2* 8.4*   < > = values in this  interval not displayed.   US RENAL  Result Date: 03/10/2023 CLINICAL DATA:  UTI. EXAM: RENAL / URINARY TRACT ULTRASOUND COMPLETE COMPARISON:  None Available. FINDINGS: Right Kidney: Renal measurements: 11.5 cm x 6.3 cm x 6.0 cm = volume: 228.6 mL. Echogenicity within normal limits. No mass or hydronephrosis visualized. Left Kidney: Renal measurements: 12.9 cm x 6.8 cm x 5.3 cm = volume: 239.3 mL. Echogenicity within normal limits. No mass or hydronephrosis visualized. Bladder: Appears normal for degree of bladder distention. The bilateral ureteral jets are visualized. Other: None. IMPRESSION: Unremarkable renal ultrasound. Electronically Signed   By: Aram Candela M.D.   On: 03/10/2023 01:36     Positive ROS: All other systems have been reviewed and were otherwise negative with the exception of those mentioned in the HPI and as above.  Physical Exam: BP 138/61   Pulse 83   Temp 98.7 F (37.1 C)   Resp (!) 23   Ht 5\' 10"  (1.778 m)   Wt 103.3 kg   SpO2 92%   BMI 32.68 kg/m  General:  Alert, no acute distress Psychiatric:  Patient is competent for consent with normal mood and affect    Orthopedic Exam:  RUE: +ain/pin/u/ax motor SILT r/u/m/ax +rad pulse RoM shoulder not tested. Significant ttp over greater tuberosity  LUE: +ain/pin/u motor. Able to fire deltoid with posterior push SILT r/u/m. Mild numbness in ax distr compared to contralateral side +rad pulse RoM shoulder not tested. Diffuse ttp about shoulder  Imaging:  R shoulder: proximal humerus fracture with minimal displacement of the greater tuberosity and surgical neck L shoulder: 4-part proximal humerus fracture with posterior dislocation of humeral head  Assessment/Plan: 82 yo M admitted with AMS/sepsis from UTI/pneumonia with minimally displaced Right proximal humerus fracture with minimal displacement of the greater tuberosity and surgical neck and Left 4-part proximal humerus fracture/dislocation. Clinical status  is improving. R shoulder - Will plan to to treat nonoperatively with NWB in a sling. He may use hand for basic ADLs as long as elbow remains by side and hand is in front of the body. He should not experience any shoulder pain with activity.  L shoulder - Will plan for operative management given fracture/dislocation. Currently, patient is not medically cleared for surgery. Per primary team, anticipate being cleared on 8/29 or 8/30. Will tentatively plan for L reverse shoulder arthroplasty on 03/13/23. NWB in sling until then. He does have some axillary nerve numbness, likely from neuropraxia from injury. Will continue to monitor this preoperatively.     Signa Kell   03/10/2023 4:45 PM

## 2023-03-11 DIAGNOSIS — J189 Pneumonia, unspecified organism: Secondary | ICD-10-CM | POA: Diagnosis not present

## 2023-03-11 LAB — HEPATIC FUNCTION PANEL
ALT: 51 U/L — ABNORMAL HIGH (ref 0–44)
AST: 48 U/L — ABNORMAL HIGH (ref 15–41)
Albumin: 2.5 g/dL — ABNORMAL LOW (ref 3.5–5.0)
Alkaline Phosphatase: 51 U/L (ref 38–126)
Bilirubin, Direct: 0.1 mg/dL (ref 0.0–0.2)
Indirect Bilirubin: 0.7 mg/dL (ref 0.3–0.9)
Total Bilirubin: 0.8 mg/dL (ref 0.3–1.2)
Total Protein: 5.8 g/dL — ABNORMAL LOW (ref 6.5–8.1)

## 2023-03-11 LAB — CBC
HCT: 28.8 % — ABNORMAL LOW (ref 39.0–52.0)
Hemoglobin: 9.7 g/dL — ABNORMAL LOW (ref 13.0–17.0)
MCH: 29.4 pg (ref 26.0–34.0)
MCHC: 33.7 g/dL (ref 30.0–36.0)
MCV: 87.3 fL (ref 80.0–100.0)
Platelets: 267 10*3/uL (ref 150–400)
RBC: 3.3 MIL/uL — ABNORMAL LOW (ref 4.22–5.81)
RDW: 15.3 % (ref 11.5–15.5)
WBC: 31.5 10*3/uL — ABNORMAL HIGH (ref 4.0–10.5)
nRBC: 0 % (ref 0.0–0.2)

## 2023-03-11 LAB — BASIC METABOLIC PANEL
Anion gap: 6 (ref 5–15)
BUN: 12 mg/dL (ref 8–23)
CO2: 23 mmol/L (ref 22–32)
Calcium: 8.4 mg/dL — ABNORMAL LOW (ref 8.9–10.3)
Chloride: 104 mmol/L (ref 98–111)
Creatinine, Ser: 0.63 mg/dL (ref 0.61–1.24)
GFR, Estimated: >60 mL/min (ref >60)
Glucose, Bld: 203 mg/dL — ABNORMAL HIGH (ref 70–99)
Potassium: 4.2 mmol/L (ref 3.5–5.1)
Sodium: 133 mmol/L — ABNORMAL LOW (ref 135–145)

## 2023-03-11 LAB — BLOOD GAS, VENOUS
Acid-base deficit: 4.7 mmol/L — ABNORMAL HIGH (ref 0.0–2.0)
Bicarbonate: 19.5 mmol/L — ABNORMAL LOW (ref 20.0–28.0)
O2 Saturation: 90.2 %
Patient temperature: 37
pCO2, Ven: 33 mmHg — ABNORMAL LOW (ref 44–60)
pH, Ven: 7.38 (ref 7.25–7.43)
pO2, Ven: 53 mmHg — ABNORMAL HIGH (ref 32–45)

## 2023-03-11 LAB — CK: Total CK: 437 U/L — ABNORMAL HIGH (ref 49–397)

## 2023-03-11 LAB — MAGNESIUM: Magnesium: 1.9 mg/dL (ref 1.7–2.4)

## 2023-03-11 LAB — PHOSPHORUS: Phosphorus: 3.3 mg/dL (ref 2.5–4.6)

## 2023-03-11 LAB — GLUCOSE, CAPILLARY
Glucose-Capillary: 199 mg/dL — ABNORMAL HIGH (ref 70–99)
Glucose-Capillary: 170 mg/dL — ABNORMAL HIGH (ref 70–99)
Glucose-Capillary: 294 mg/dL — ABNORMAL HIGH (ref 70–99)
Glucose-Capillary: 255 mg/dL — ABNORMAL HIGH (ref 70–99)
Glucose-Capillary: 251 mg/dL — ABNORMAL HIGH (ref 70–99)

## 2023-03-11 MED ORDER — CEFUROXIME AXETIL 500 MG PO TABS
500.0000 mg | ORAL_TABLET | Freq: Two times a day (BID) | ORAL | Status: AC
  Administered 2023-03-11 – 2023-03-12 (×4): 500 mg via ORAL
  Filled 2023-03-11 (×4): qty 1

## 2023-03-11 MED ORDER — ADULT MULTIVITAMIN W/MINERALS CH
1.0000 | ORAL_TABLET | Freq: Every day | ORAL | Status: DC
  Administered 2023-03-12 – 2023-03-20 (×7): 1 via ORAL
  Filled 2023-03-11 (×8): qty 1

## 2023-03-11 MED ORDER — INSULIN ASPART 100 UNIT/ML IJ SOLN
4.0000 [IU] | Freq: Three times a day (TID) | INTRAMUSCULAR | Status: DC
  Administered 2023-03-11 – 2023-03-20 (×22): 4 [IU] via SUBCUTANEOUS
  Filled 2023-03-11 (×21): qty 1

## 2023-03-11 MED ORDER — ENSURE MAX PROTEIN PO LIQD
11.0000 [oz_av] | Freq: Two times a day (BID) | ORAL | Status: DC
  Administered 2023-03-11 – 2023-03-20 (×15): 11 [oz_av] via ORAL
  Filled 2023-03-11: qty 330

## 2023-03-11 MED ORDER — INSULIN ASPART 100 UNIT/ML IJ SOLN
0.0000 [IU] | Freq: Three times a day (TID) | INTRAMUSCULAR | Status: DC
  Administered 2023-03-11: 3 [IU] via SUBCUTANEOUS
  Administered 2023-03-12: 8 [IU] via SUBCUTANEOUS
  Administered 2023-03-12 (×2): 3 [IU] via SUBCUTANEOUS
  Administered 2023-03-13 (×2): 5 [IU] via SUBCUTANEOUS
  Administered 2023-03-14: 3 [IU] via SUBCUTANEOUS
  Administered 2023-03-14: 8 [IU] via SUBCUTANEOUS
  Administered 2023-03-14: 2 [IU] via SUBCUTANEOUS
  Administered 2023-03-15: 3 [IU] via SUBCUTANEOUS
  Administered 2023-03-15 (×2): 8 [IU] via SUBCUTANEOUS
  Administered 2023-03-16: 5 [IU] via SUBCUTANEOUS
  Administered 2023-03-16: 8 [IU] via SUBCUTANEOUS
  Administered 2023-03-16 – 2023-03-17 (×2): 5 [IU] via SUBCUTANEOUS
  Administered 2023-03-18: 3 [IU] via SUBCUTANEOUS
  Administered 2023-03-18: 8 [IU] via SUBCUTANEOUS
  Administered 2023-03-19 (×3): 5 [IU] via SUBCUTANEOUS
  Administered 2023-03-20: 3 [IU] via SUBCUTANEOUS
  Administered 2023-03-20 (×2): 5 [IU] via SUBCUTANEOUS
  Filled 2023-03-11 (×25): qty 1

## 2023-03-11 MED ORDER — ENSURE ENLIVE PO LIQD: 237.0000 mL | Freq: Three times a day (TID) | ORAL | Status: DC

## 2023-03-11 MED ORDER — INSULIN ASPART 100 UNIT/ML IJ SOLN
0.0000 [IU] | Freq: Every day | INTRAMUSCULAR | Status: DC
  Administered 2023-03-11 – 2023-03-13 (×3): 3 [IU] via SUBCUTANEOUS
  Administered 2023-03-14 – 2023-03-16 (×3): 2 [IU] via SUBCUTANEOUS
  Administered 2023-03-17: 4 [IU] via SUBCUTANEOUS
  Administered 2023-03-18 – 2023-03-19 (×2): 2 [IU] via SUBCUTANEOUS
  Filled 2023-03-11 (×9): qty 1

## 2023-03-11 NOTE — Progress Notes (Addendum)
0800 Up to sit in chair. Able to carry on fluent conversation. Oriented x 4. 0830 Ate breakfast without issues. 1000 Dr Allena Katz in to discuss left shoulder Patient insist on staying up in chair.  1115 Worked with OT. Up in room. 1230 Ate lunch in chair.  1400 Back to bed . Tired. 1500 Walked with PT. 1515 Transferred to room 237 via bed. Report given to [facility]

## 2023-03-11 NOTE — Progress Notes (Signed)
Delayed entry. Patient seen and examined at bedside around 1230pm today.   LUE neurovascular exam normalized. No numbness in axillary nerve distribution today when compared to contralateral side. Able to fire deltoid muscle.   Had full discussion regarding surgical treatment of L shoulder with reverse shoulder arthroplasty. After discussion of r/b/a to surgery, patient and wife elected to proceed. Plan for OR on Friday, 8/30 for L RSA. Continue with non surgical treatment of R shoulder.

## 2023-03-11 NOTE — Plan of Care (Signed)
  Problem: Activity: Goal: Ability to tolerate increased activity will improve Outcome: Progressing   Problem: Clinical Measurements: Goal: Ability to maintain a body temperature in the normal range will improve Outcome: Progressing   Problem: Respiratory: Goal: Ability to maintain adequate ventilation will improve Outcome: Progressing Goal: Ability to maintain a clear airway will improve Outcome: Progressing   Problem: Nutritional: Goal: Maintenance of adequate nutrition will improve Outcome: Progressing   Problem: Skin Integrity: Goal: Risk for impaired skin integrity will decrease Outcome: Progressing

## 2023-03-11 NOTE — Evaluation (Signed)
Occupational Therapy Evaluation Patient Details Name: Philip Robbins MRN: [id number] DOB: 08-03-1940 Today's Date: 03/11/2023   History of Present Illness Pt is an 82 y.o. male presenting to hospital 03/08/23 with c/o AMS and hypoxia; wife concerned pt was having a seizure.  Imaging showing: Comminuted fracture of left humeral head and neck (Posterior subluxation of the humeral head, with anterior displacement of the humeral neck); mildly displaced, comminuted fracture of the right humeral head and neck.  Per notes (prior to hospitalization) "Patient went for a left neck lymph node biopsy on Friday. Afterwards, patient started to have upper respiratory infection symptoms. Then next day, he started having urinary infection symptoms. Was taken to urgent care where he was diagnosed with urinary tract infection and started on levofloxacin. Patient last night became hypoxic on room air, febrile and possible seizure".  Pt admitted with severe sepsis, acute hyponatremia, question seizure, aspiration PNA, UTI.  PMH includes UTI, CLL under surveillance, BPH, htn, IIDM, hypothyroidism, HLD, chronic leukocytosis.   Clinical Impression   Chart reviewed, pt greeted in chair, agreeable to OT evaluation. Pt is oriented to self, place, grossly oriented to situation, not oriented to date. Increased time for one step direction following, verbal cues for pacing. Pt requires MAX A for ADLs due to current NWBing status for BUE. Pt amb in room with CGA-MIN A appro x25'. Increased pain with AROM to simulate grooming/feeding tasks with RUE (pt dominant UE) per orders. Pt presents with deficits in strength, endurance, activity tolerance, BUE function affecting safe and optimal ADL completion. Pt will benefit from ongoing OT to address deficits and to facilitate return to PLOF via compensatory techniques and adaptive equipment.       If plan is discharge home, recommend the following: A lot of help with walking and/or  transfers;A lot of help with bathing/dressing/bathroom;Assistance with cooking/housework;Direct supervision/assist for medications management;Assist for transportation;Direct supervision/assist for financial management;Help with stairs or ramp for entrance;Assistance with feeding    Functional Status Assessment  Patient has had a recent decline in their functional status and demonstrates the ability to make significant improvements in function in a reasonable and predictable amount of time.  Equipment Recommendations  BSC/3in1;Wheelchair (measurements OT);Tub/shower seat    Recommendations for Other Services       Precautions / Restrictions Precautions Precaution Comments: B UE's in shoulder slings; seizure precautions Required Braces or Orthoses: Sling Restrictions Weight Bearing Restrictions: Yes RUE Weight Bearing: Non weight bearing LUE Weight Bearing: Non weight bearing Other Position/Activity Restrictions: per ortho note 8/27: "R shoulder - Will plan to to treat nonoperatively with NWB in a sling. He may use hand for basic ADLs as long as elbow remains by side and hand is in front of the body. He should not experience any shoulder pain with activity.   L shoulder - Will plan for operative management given fracture/dislocation. Currently, patient is not medically cleared for surgery. Per primary team, anticipate being cleared on 8/29 or 8/30. Will tentatively plan for L reverse shoulder arthroplasty on 03/13/23. NWB in sling until then"      Mobility Bed Mobility               General bed mobility comments: NT in recliner pre/post session    Transfers Overall transfer level: Needs assistance Equipment used: None (B slings) Transfers: Sit to/from Stand, Bed to chair/wheelchair/BSC Sit to Stand: Contact guard assist, Min assist                  Balance  Overall balance assessment: Needs assistance Sitting-balance support: No upper extremity supported, Feet  supported Sitting balance-Leahy Scale: Good     Standing balance support: No upper extremity supported Standing balance-Leahy Scale: Fair                             ADL either performed or assessed with clinical judgement   ADL Overall ADL's : Needs assistance/impaired Eating/Feeding: Sitting;Maximal assistance   Grooming: Wash/dry face;Maximal assistance   Upper Body Bathing: Maximal assistance Upper Body Bathing Details (indicate cue type and reason): due to BUE restrictions Lower Body Bathing: Maximal assistance Lower Body Bathing Details (indicate cue type and reason): due to BUE restrictions Upper Body Dressing : Maximal assistance Upper Body Dressing Details (indicate cue type and reason): B slings Lower Body Dressing: Maximal assistance Lower Body Dressing Details (indicate cue type and reason): due to BUE restrictions Toilet Transfer: Contact guard assist;Minimal assistance;Ambulation Toilet Transfer Details (indicate cue type and reason): simulated Toileting- Clothing Manipulation and Hygiene: Maximal assistance;Sit to/from stand Toileting - Clothing Manipulation Details (indicate cue type and reason): anticipated, due to BUE restrictions     Functional mobility during ADLs: Contact guard assist;Minimal assistance;Cueing for safety (approx 25' in room)       Vision Patient Visual Report: No change from baseline       Perception         Praxis         Pertinent Vitals/Pain Pain Assessment Pain Assessment: 0-10 Pain Score: 9  Pain Location: R shoulder pain with attempted movement within parameters, 4/10 when moving, in sling Pain Descriptors / Indicators: Aching Pain Intervention(s): Limited activity within patient's tolerance, Monitored during session, Repositioned     Extremity/Trunk Assessment Upper Extremity Assessment Upper Extremity Assessment: RUE deficits/detail;LUE deficits/detail RUE Deficits / Details: in sling, within parameters,  pt requires MIN A to faciltiate AROM in R elbow towards mouth, limited by pain; wrist/hand ROM appears WFL; strength NT RUE: Unable to fully assess due to immobilization;Shoulder pain with ROM RUE Coordination: decreased gross motor LUE Deficits / Details: in sling throughout per orders LUE: Unable to fully assess due to immobilization LUE Coordination: decreased fine motor;decreased gross motor   Lower Extremity Assessment Lower Extremity Assessment: Generalized weakness   Cervical / Trunk Assessment Cervical / Trunk Assessment: Normal   Communication Communication Communication: Difficulty following commands/understanding Following commands: Follows one step commands with increased time;Follows one step commands consistently Cueing Techniques: Verbal cues;Visual cues;Tactile cues   Cognition Arousal: Alert Behavior During Therapy: WFL for tasks assessed/performed Overall Cognitive Status: Impaired/Different from baseline Area of Impairment: Orientation, Following commands, Safety/judgement, Awareness, Problem solving                 Orientation Level: Disoriented to, Time     Following Commands: Follows multi-step commands with increased time Safety/Judgement: Decreased awareness of deficits Awareness: Emergent Problem Solving: Slow processing, Difficulty sequencing General Comments: mild safety awareness deficits noted, will continue to assess     General Comments  vss throughout on RA    Exercises Other Exercises Other Exercises: edu re: role of OT, role of rehab, discharge recommendations, home safety, falls prevention, ADL completion within precautions, Weight bearing/movement precautions   Shoulder Instructions      Home Living Family/patient expects to be discharged to:: Private residence Living Arrangements: Spouse/significant other Available Help at Discharge: Available PRN/intermittently (wife works in customer services out of the house approx 8 hrs per  day) Type of  Home: House Home Access: Stairs to enter [employer] of Steps: 4-5 Entrance Stairs-Rails: Right;Left Home Layout: One level     Bathroom Shower/Tub: Chief Strategy Officer: Standard     Home Equipment: BSC/3in1   Additional Comments: pt and wife repor they are set to move into a different house in October as pt is in pastor housing right now      Prior Functioning/Environment Prior Level of Function : Independent/Modified Independent             Mobility Comments: amb with no AD ADLs Comments: Indep in ADL/IADL        OT Problem List: Decreased strength;Decreased activity tolerance;Decreased knowledge of use of DME or AE;Impaired UE functional use;Decreased safety awareness;Decreased knowledge of precautions;Impaired balance (sitting and/or standing)      OT Treatment/Interventions: Self-care/ADL training;Therapeutic exercise;Patient/family education;Balance training;Energy conservation;Therapeutic activities;DME and/or AE instruction    OT Goals(Current goals can be found in the care plan section) Acute Rehab OT Goals Patient Stated Goal: continue medical tx OT Goal Formulation: With patient Time For Goal Achievement: 03/25/23 Potential to Achieve Goals: Good ADL Goals Pt Will Perform Eating: with min assist;sitting;with adaptive utensils Pt Will Perform Grooming: with min assist;with adaptive equipment Pt Will Perform Lower Body Dressing: with mod assist;sitting/lateral leans;with caregiver independent in assisting Pt Will Transfer to Toilet: with modified independence;ambulating Pt Will Perform Toileting - Clothing Manipulation and hygiene: with caregiver independent in assisting  OT Frequency: Min 1X/week    Co-evaluation              AM-PAC OT "6 Clicks" Daily Activity     Outcome Measure Help from another person eating meals?: A Lot Help from another person taking care of personal grooming?: A Lot Help from another  person toileting, which includes using toliet, bedpan, or urinal?: A Lot Help from another person bathing (including washing, rinsing, drying)?: A Lot Help from another person to put on and taking off regular upper body clothing?: A Lot Help from another person to put on and taking off regular lower body clothing?: A Lot 6 Click Score: 12   End of Session Nurse Communication: Mobility status  Activity Tolerance: Patient tolerated treatment well Patient left: in chair;with call bell/phone within reach;with family/visitor present  OT Visit Diagnosis: Muscle weakness (generalized) (M62.81);Other abnormalities of gait and mobility (R26.89)                Time: 1610-9604 OT Time Calculation (min): 19 min Charges:  OT General Charges $OT Visit: 1 Visit OT Evaluation $OT Eval High Complexity: 1 High Oleta Mouse, OTD OTR/L  03/11/23, 11:20 AM

## 2023-03-11 NOTE — Progress Notes (Signed)
[facility] Progress Note  Patient: Philip Robbins    AOZ:[id number]  DOA: 03/08/2023     Date of Service: the patient was seen and examined on 03/11/2023  Chief Complaint  Patient presents with   Fever   Brief hospital course:  Philip Robbins is a 82 y.o. male with medical history significant of CLL under surveillance, BPH, HTN, IIDM, hypothyroidism, HLD, chronic leukocytosis presented with altered mentations hypoxia.   His symptoms started last Friday, patient went to have a left neck lymph node biopsy, after the procedure patient developed URI-like symptoms with runny nose sore throat, yesterday, patient developed strong burning sensation in the urine and dark-colored urine and went to see urgent care was diagnosed with UTI and started on antibiotics.  Patient continued to feel malaise generalized weakness, nauseous but no vomiting and no abdominal pain overnight and this morning patient was found in the bed unresponsive with large vomitus on his bed and foams in his mouth.  Family called EMS, EMS arrived and found patient hypoxic O2 saturation 95% on room air and patient was placed on 15 L NRB and sent to ED.  Fever 101.8.  Wife also witnessed patient had some twitching movement of his arms and suspect whether he had seizure.  At baseline patient has BPH appears to be poorly controlled, he reported Brueggen screams and frequent nocturnal urination but no history of UTI before.   ED Course: Fever 101.2, tachycardia hypotensive oxygen saturation 91% on 6 L.  Chest x-ray showed bilateral infiltrates indicating for pneumonia.  WBC 35.1.  Hemoglobin 12, sodium 118, K4.1, bicarb 12, creatinine 1.1, troponin 64> 99, AST 67, ALT 46   Patient was given 3 L IV bolus and started on ceftriaxone, azithromycin Assessment and Plan:  Sepsis, severe, Resolved -Evidenced by new onset of hypoxia, elevated temperature and elevated lactic acid with signs of endorgan damage of acute encephalopathy and source  of infection likely aspiration pneumonia and UTI. s/p IVF given as per protocol.  -Continue ceftriaxone azithromycin to cover pneumonia and UTI  Acute hypoxic respiratory failure secondary to pneumonia and COPD/asthma exacerbation  Patient does not have diagnosis of COPD or asthma Patient was wheezing, resolved on 8/28 Continue above antibiotics for pneumonia 8/27  prednisone 40 mg p.o. daily for 3 days Started Breo Ellipta inhaler and DuoNeb TID 8/28 respiratory failure resolved, patient saturating well on room air.   Aspiration pneumonia -Likely secondary to repeated vomiting at night, management as above  UTI -Likely secondary to uncontrolled BPH, continue antibiotics -Check PVR, outpatient follow-up with urology for urodynamic study -Continue Flomax  acute hypotonic hyponatremia -Hypovolemic, likely secondary to acute GI loss from repeated vomiting overnight and sepsis Serum osmolality 257 low, TSH 2.6 within normal range Nephro consulted, s/p saline 3% IVF discontinued on 8/27 Na 118------133 Sodium level gradually improving, continue to monitor. Continue fluid restriction 1.5 L/day   # Hypophosphatemia, Phos repleted.  Resolved # Hypomagnesemia, mag repleted.  Resolved Monitor electrolytes daily.  And replete as needed  # B/L Humral fracture No h/o fall, possible trauma while transportation as pt was combative and he was held strongly by EMS.  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  Repeat sodium level and likely will start hypertonic saline, consult nephrology. -Seizure precaution.   Troponin elevation -No  chest pains, EKG showed no acute ST changes and the poor R wave progression appears to be chronic. -Etiology  likely related to sepsis and acute hypoxia and demanding ischemia.  Treat sepsis, repeat troponin this evening.   CLL with chronic leukocytosis -Peripheral smear is being reviewed to rule out acute transformation -Management of sepsis as above   HTN Blood pressure improved 8/27 resumed lisinopril 20 mg p.o. 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 weekly, follow with PCP to repeat vitamin D level after 3 to 6 months.  Vitamin B12 deficiency: Started vitamin B12 1000 mcg oral supplement, avoided IM injection due to bilateral humeral fractures Follow-up PCP to repeat vitamin B12 level after 3 to 6 months.  Iron deficiency, transferrin saturation 6%, started oral iron supplement.  Avoided IV iron due to active infection. Follow-up with PCP to repeat iron profile after 3 to 6 months Folic acid level 8.3, at lower end, started oral supplement for 3 months.   Body mass index is 32.68 kg/m.  Interventions:  Diet: Heart healthy diet DVT Prophylaxis: Subcutaneous Lovenox   Advance goals of care discussion: Full code  Family Communication: family was present at bedside, at the time of interview.  The pt provided permission to discuss medical plan with the family. Opportunity was given to ask question and all questions were answered satisfactorily.   Disposition:  Pt is from Home, admitted with hyponatremia, pneumonia, respiratory failure, UTI, b/l.  Humerus fracture, still has resp failure and low sodium, which precludes a safe discharge. Discharge to SNF , when medically stable.  Subjective: No significant overnight events.  Patient was sitting comfortably on the bedside chair, stated that pain is under control, breathing has improved, saturating well on room air.  Denied any  secondary complaints.  Patient is ready for left shoulder surgery on Friday.   Physical Exam: General: NAD, lying comfortably Appear in no distress, affect appropriate Eyes: PERRLA ENT: Oral Mucosa Clear, moist  Neck: no JVD,  Cardiovascular: S1 and S2 Present, no Murmur,  Respiratory: Good air entry bilaterally, b/l  crackles and mild wheezing. Abdomen: Bowel Sound present, Soft and no tenderness,  Skin: no rashes Extremities: no Pedal edema, no calf tenderness.  Bilateral shoulders slings intact Neurologic: without any new focal findings Gait not checked due to patient safety concerns  Vitals:   03/11/23 1100 03/11/23 1200 03/11/23 1300 03/11/23 1400  BP: (!) 138/52 (!) 146/60 138/60 (!) 119/53  Pulse: 85 81 92 85  Resp: (!) 23 (!) 25 (!) 29 (!) 26  Temp:      TempSrc:      SpO2: 92% 90% (!) 89% 92%  Weight:      Height:        Intake/Output Summary (Last 24 hours) at 03/11/2023 1441 Last data filed at 03/11/2023 1430 Gross per 24 hour  Intake 1040 ml  Output 1900 ml  Net -860 ml   Filed Weights   03/08/23 0610 03/08/23 1314 03/10/23 0400  Weight: 99.8 kg 105.9 kg 103.3 kg    Data Reviewed: I have personally reviewed and interpreted daily labs, tele strips, imagings as discussed above. I reviewed all nursing notes, pharmacy notes, vitals, pertinent old records I have discussed plan of care as described above with RN and patient/family.  CBC: Recent Labs  Lab 03/08/23 [id number] 03/09/23  2841 03/10/23 0430 03/11/23 0418  WBC 35.1* 34.7* 29.7* 31.5*  NEUTROABS 19.8*  --   --   --   HGB 12.1* 9.9* 10.5* 9.7*  HCT 36.5* 29.7* 31.5* 28.8*  MCV 90.1 87.6 88.5 87.3  PLT 218 200 243 267   Basic Metabolic Panel: Recent Labs  Lab 03/08/23 0620 03/08/23 1110 03/08/23 1353 03/09/23 0436 03/09/23 0927 03/09/23 2011 03/10/23 0018 03/10/23 0430 03/10/23 0918 03/11/23 0418  NA 118* 118*   < > 123*   < > 127* 130* 132* 132* 133*  K 4.1 4.1  --  4.2  --   --   --   4.0  --  4.2  CL 92* 93*  --  96*  --   --   --  103  --  104  CO2 12* 18*  --  22  --   --   --  22  --  23  GLUCOSE 201* 184*  --  148*  --   --   --  148*  --  203*  BUN 17 13  --  13  --   --   --  11  --  12  CREATININE 1.13 0.85  --  0.79  --   --   --  0.75  --  0.63  CALCIUM 8.3* 8.1*  --  8.2*  --   --   --  8.4*  --  8.4*  MG  --   --   --  1.7  --   --   --  1.5*  --  1.9  PHOS  --   --   --  2.2*  --   --   --  1.8*  --  3.3   < > = values in this interval not displayed.    Studies: CT SHOULDER LEFT WO CONTRAST  Result Date: 03/10/2023 CLINICAL DATA:  Left shoulder pain.  Proximal humerus fracture. EXAM: CT OF THE UPPER LEFT EXTREMITY WITHOUT CONTRAST TECHNIQUE: Multidetector CT imaging of the upper left extremity was performed according to the standard protocol. RADIATION DOSE REDUCTION: This exam was performed according to the departmental dose-optimization program which includes automated exposure control, adjustment of the mA and/or kV according to patient size and/or use of iterative reconstruction technique. COMPARISON:  Left shoulder x-rays dated March 08, 2023. FINDINGS: Bones/Joint/Cartilage Acute comminuted fracture of the left proximal humerus involving the surgical neck, humeral head, and greater and lesser tuberosities. There is impaction at the neck measuring approximately 2.8 cm, with 1.4 cm anterior displacement. The humeral head is dislocated posteriorly with respect to the glenoid. The glenoid appears intact. Mild degenerative changes of the acromioclavicular joint. Large glenohumeral hemarthrosis. Ligaments Ligaments are suboptimally evaluated by CT. Muscles and Tendons Grossly intact.  No muscle atrophy. Soft tissue No fluid collection or hematoma. No soft tissue mass. Visualized left lung is clear. IMPRESSION: 1. Acute comminuted fracture-dislocation of the left proximal humerus as described above. 2. Large glenohumeral hemarthrosis. Electronically Signed   By:  Obie Dredge M.D.   On: 03/10/2023 18:43    Scheduled Meds:  vitamin C  500 mg Oral Daily   azithromycin  500 mg Oral Daily   cefUROXime  500 mg Oral BID WC   Chlorhexidine Gluconate Cloth  6 each Topical Q0600   vitamin B-12  1,000 mcg Oral Daily   diclofenac Sodium  2 g Topical QID   enoxaparin (LOVENOX) injection  0.5 mg/kg Subcutaneous Q24H   fluticasone furoate-vilanterol  1 puff Inhalation Daily   folic acid  1 mg Oral Daily   guaiFENesin  600 mg Oral BID   insulin aspart  0-15 Units Subcutaneous TID WC   insulin aspart  0-5 Units Subcutaneous QHS   insulin aspart  4 Units Subcutaneous TID WC   ipratropium-albuterol  3 mL Nebulization TID   iron polysaccharides  150 mg Oral Daily   levothyroxine  50 mcg Oral Q0600   lisinopril  20 mg Oral Daily   [START ON 03/12/2023] multivitamin with minerals  1 tablet Oral Daily   oxymetazoline  1 [place] Each Nare BID   pantoprazole  40 mg Oral BID   Followed by   Melene Muller ON 03/15/2023] pantoprazole  40 mg Oral Daily   predniSONE  40 mg Oral Q breakfast   Ensure Max Protein  11 oz Oral BID   tamsulosin  0.4 mg Oral QPC supper   Vitamin D (Ergocalciferol)  50,000 Units Oral Q7 days   Continuous Infusions:   PRN Meds: acetaminophen, chlorpheniramine-HYDROcodone, hydrALAZINE, HYDROmorphone (DILAUDID) injection, ketorolac, ondansetron **OR** ondansetron (ZOFRAN) IV  Time spent: 40 minutes  Author: Gillis Santa. MD [facility] 03/11/2023 2:41 PM  To reach On-call, see care teams to locate the attending and reach out to them via [email or web address]. If 7PM-7AM, please contact night-coverage If you still have difficulty reaching the attending provider, please page the [employer] (Director on Call) for [facility] on amion for assistance.

## 2023-03-11 NOTE — Consult Note (Signed)
PHARMACY CONSULT NOTE - ELECTROLYTES  Pharmacy Consult for Electrolyte Monitoring and Replacement   Recent Labs: Potassium (mmol/L)  Date Value  03/11/2023 4.2   Magnesium (mg/dL)  Date Value  40/98/1191 1.9   Calcium (mg/dL)  Date Value  47/82/9562 8.4 (L)   Albumin (g/dL)  Date Value  13/02/6577 2.5 (L)   Phosphorus (mg/dL)  Date Value  46/96/2952 3.3   Sodium (mmol/L)  Date Value  03/11/2023 133 (L)   Height: 5\' 10"  (177.8 cm) Weight: 103.3 kg (227 lb 11.8 oz) IBW/kg (Calculated) : 73 Estimated Creatinine Clearance: 88.6 mL/min (by C-G formula based on SCr of 0.63 mg/dL).  Assessment  Philip Robbins is a 82 y.o. male presenting with pneumonia and hyponatremia. PMH significant for CLL, BPH, HTN, DM, hypothyroidism, HLD. Pharmacy has been consulted to monitor and replace electrolytes.  Diet: Heart healthy MIVF: N/A Pertinent medications: N/A  Goal of Therapy: Electrolytes within normal limits  Plan:  No electrolyte replacement indicated at this time Follow-up electrolytes with AM labs tomorrow  Thank you for allowing pharmacy to be a part of this patient's care.  Tressie Ellis 03/11/2023 7:54 AM

## 2023-03-11 NOTE — Plan of Care (Signed)

## 2023-03-11 NOTE — TOC Initial Note (Signed)
Transition of Care [facility]) - Initial/Assessment Note    Patient Details  Name: Philip Robbins MRN: [id number] Date of Birth: Dec 07, 1940  Transition of Care [facility]) CM/SW Contact:    Kreg Shropshire, RN Phone Number: 03/11/2023, 12:05 PM  Clinical Narrative:                 Pt arrived from ED from: Home with family  Caregiver Support: Wife [name] DME at Home: none Transportation: none Previous Services: none HH/SNF Preference: none First Person of Contact: wife [name] PCP:  Cm confirmed services with [facility] by [place]. Cm will continue to follow for toc needs and d/c planning.   Expected Discharge Plan: Home w Home Health Services Barriers to Discharge: Continued Medical Work up   Patient Goals and CMS Choice   CMS Medicare.gov Compare Post Acute Care list provided to:: Patient Choice offered to / list presented to : Patient      Expected Discharge Plan and Services     Post Acute Care Choice: Home Health Living arrangements for the past 2 months: Single Family Home                           HH Arranged: PT HH Agency: [facility] Date [facility] Agency Contacted: 03/11/23   Representative spoke with at [facility] Agency: [place]  Prior Living Arrangements/Services Living arrangements for the past 2 months: Single Family Home Lives with:: Self, Spouse          Need for Family Participation in Patient Care: Yes (Comment)        Activities of Daily Living Home Assistive Devices/Equipment: None ADL Screening (condition at time of admission) Patient's cognitive ability adequate to safely complete daily activities?: Yes Is the patient deaf or have difficulty hearing?: No Does the patient have difficulty seeing, even when wearing glasses/contacts?: No Does the patient have difficulty concentrating, remembering, or making decisions?: No Patient able to express need for assistance with ADLs?: Yes Does the patient have difficulty dressing or bathing?:  No Independently performs ADLs?: Yes (appropriate for developmental age) Communication: Independent Is this a change from baseline?: Pre-admission baseline Dressing (OT): Independent Is this a change from baseline?: Pre-admission baseline Grooming: Independent Is this a change from baseline?: Pre-admission baseline Feeding: Independent Is this a change from baseline?: Pre-admission baseline Bathing: Independent Is this a change from baseline?: Pre-admission baseline Toileting: Independent Is this a change from baseline?: Pre-admission baseline In/Out Bed: Independent Is this a change from baseline?: Pre-admission baseline Walks in Home: Independent Is this a change from baseline?: Pre-admission baseline Does the patient have difficulty walking or climbing stairs?: No Weakness of Legs: None Weakness of Arms/Hands: None  Permission Sought/Granted                  Emotional Assessment       Orientation: : Oriented to  Time, Oriented to Self, Oriented to Place, Oriented to Situation      Admission diagnosis:  Hyponatremia [E87.1] Bronchopneumonia [J18.0] Pneumonia [J18.9] Sepsis (HCC) [A41.9] Fever, unspecified fever cause [R50.9] Sepsis, due to unspecified organism, unspecified whether acute organ dysfunction present [facility]) [A41.9] Patient Active Problem List   Diagnosis Date Noted   CAP (community acquired pneumonia) 03/08/2023   Pneumonia 03/08/2023   Sepsis (HCC) 03/08/2023   Hyponatremia 03/08/2023   PCP:  Pcp, No Pharmacy:  No Pharmacies Listed    Social Determinants of Health (SDOH) Social History: SDOH Screenings   Food Insecurity: No  Food Insecurity (03/10/2023)  Housing: Low Risk  (03/10/2023)  Transportation Needs: No Transportation Needs (03/10/2023)  Utilities: Not At Risk (03/10/2023)   SDOH Interventions:     Readmission Risk Interventions     No data to display

## 2023-03-11 NOTE — Progress Notes (Signed)
Physical Therapy Treatment Patient Details Name: Philip Robbins MRN: [id number] DOB: 10-Oct-1940 Today's Date: 03/11/2023   History of Present Illness Pt is an 82 y.o. male presenting to hospital 03/08/23 with c/o AMS and hypoxia; wife concerned pt was having a seizure.  Imaging showing: Comminuted fracture of left humeral head and neck (Posterior subluxation of the humeral head, with anterior displacement of the humeral neck); mildly displaced, comminuted fracture of the right humeral head and neck.  Per notes (prior to hospitalization) "Patient went for a left neck lymph node biopsy on Friday. Afterwards, patient started to have upper respiratory infection symptoms. Then next day, he started having urinary infection symptoms. Was taken to urgent care where he was diagnosed with urinary tract infection and started on levofloxacin. Patient last night became hypoxic on room air, febrile and possible seizure".  Pt admitted with severe sepsis, acute hyponatremia, question seizure, aspiration PNA, UTI.  PMH includes UTI, CLL under surveillance, BPH, htn, IIDM, hypothyroidism, HLD, chronic leukocytosis.    PT Comments  Patient supine in bed on arrival and agreeable to therapy session. Wife arrives at beginning of session. Required modA for bed mobility and CGA for sit to stand. Reporting pain in B shoulder (R>L) with movement. Ambulated 120' with CGA and no AD, minor LOBs noted during mobility requiring at times minA to recover. Cues for slower speed for safety. NT present at end of session with new bed for transfer to floor. Assisted patient onto new bed. Discharge plan remains appropriate. Planning for surgery on L shoulder 8/30.     If plan is discharge home, recommend the following: A little help with walking and/or transfers;A little help with bathing/dressing/bathroom;Assistance with cooking/housework;Assistance with feeding;Direct supervision/assist for medications management;Assist for  transportation;Help with stairs or ramp for entrance   Can travel by private vehicle        Equipment Recommendations  None recommended by PT    Recommendations for Other Services       Precautions / Restrictions Precautions Precautions: Fall Restrictions Weight Bearing Restrictions: Yes RUE Weight Bearing: Non weight bearing LUE Weight Bearing: Non weight bearing Other Position/Activity Restrictions: per ortho note 8/27: "R shoulder - Will plan to to treat nonoperatively with NWB in a sling. He may use hand for basic ADLs as long as elbow remains by side and hand is in front of the body. He should not experience any shoulder pain with activity.   L shoulder - Will plan for operative management given fracture/dislocation. Currently, patient is not medically cleared for surgery. Per primary team, anticipate being cleared on 8/29 or 8/30. Will tentatively plan for L reverse shoulder arthroplasty on 03/13/23. NWB in sling until then"     Mobility  Bed Mobility Overal bed mobility: Needs Assistance Bed Mobility: Supine to Sit, Sit to Supine     Supine to sit: Mod assist, HOB elevated Sit to supine: Mod assist   General bed mobility comments: assist trunk elevation to EOB and LE management back into bed    Transfers Overall transfer level: Needs assistance Equipment used: None Transfers: Sit to/from Stand Sit to Stand: Contact guard assist                Ambulation/Gait Ambulation/Gait assistance: Contact guard assist, Min assist Gait Distance (Feet): 120 Feet Assistive device: None Gait Pattern/deviations: Step-through pattern, Decreased stride length Gait velocity: decreased     General Gait Details: decreased stride length and shuffling initially. Minor LOBs noted during mobility requiring minA for balance. Cues for  slowed pace for safety   Stairs             Wheelchair Mobility     Tilt Bed    Modified Rankin (Stroke Patients Only)        Balance Overall balance assessment: Needs assistance Sitting-balance support: No upper extremity supported, Feet supported Sitting balance-Leahy Scale: Good     Standing balance support: No upper extremity supported Standing balance-Leahy Scale: Fair                              Cognition Arousal: Alert Behavior During Therapy: WFL for tasks assessed/performed Overall Cognitive Status: Within Functional Limits for tasks assessed                                 General Comments: mild safety awareness deficits noted, will continue to assess        Exercises      General Comments        Pertinent Vitals/Pain Pain Assessment Pain Assessment: Faces Faces Pain Scale: Hurts whole lot Pain Location: B shoulders (R>L) Pain Descriptors / Indicators: Aching Pain Intervention(s): Limited activity within patient's tolerance, Monitored during session, Repositioned    Home Living                          Prior Function            PT Goals (current goals can now be found in the care plan section) Acute Rehab PT Goals Patient Stated Goal: to improve mobility PT Goal Formulation: With patient Time For Goal Achievement: 03/24/23 Potential to Achieve Goals: Good Progress towards PT goals: Progressing toward goals    Frequency    Min 1X/week      PT Plan      Co-evaluation              AM-PAC PT "6 Clicks" Mobility   Outcome Measure  Help needed turning from your back to your side while in a flat bed without using bedrails?: A Lot Help needed moving from lying on your back to sitting on the side of a flat bed without using bedrails?: A Lot Help needed moving to and from a bed to a chair (including a wheelchair)?: A Little Help needed standing up from a chair using your arms (e.g., wheelchair or bedside chair)?: A Little Help needed to walk in hospital room?: A Little Help needed climbing 3-5 steps with a railing? : A Lot 6  Click Score: 15    End of Session Equipment Utilized During Treatment: Gait belt Activity Tolerance: Patient tolerated treatment well Patient left: in bed;with call bell/phone within reach;with nursing/sitter in room;with family/visitor present Nurse Communication: Mobility status PT Visit Diagnosis: Unsteadiness on feet (R26.81);Other abnormalities of gait and mobility (R26.89);Muscle weakness (generalized) (M62.81);Pain     Time: 1447-1511 PT Time Calculation (min) (ACUTE ONLY): 24 min  Charges:    $Therapeutic Activity: 23-37 mins PT General Charges $$ ACUTE PT VISIT: 1 Visit                     Maylon Peppers, PT, DPT Physical Therapist - [facility]  [facility]    Rayhan Groleau A Brylie Sneath 03/11/2023, 3:48 PM

## 2023-03-11 NOTE — Progress Notes (Addendum)
Initial Nutrition Assessment  DOCUMENTATION CODES:   Obesity unspecified  INTERVENTION:   Ensure Max protein supplement po BID, each supplement provides 150kcal and 30g of protein.   MVI po daily   NUTRITION DIAGNOSIS:   Increased nutrient needs related to cancer and cancer related treatments as evidenced by estimated needs.  GOAL:   Patient will meet greater than or equal to 90% of their needs  MONITOR:   PO intake, Supplement acceptance, Labs, Weight trends, I & O's, Skin  REASON FOR ASSESSMENT:   Malnutrition Screening Tool    ASSESSMENT:   82 y.o. male with medical history significant of CLL under surveillance, BPH, HTN, IIDM, hypothyroidism, HLD, hypothyroidism and IBS who is admitted with CAP, UTI, sepsis and R proximal humerus fracture and L proximal humerus fracture/dislocation.  Met with pt in room today. Pt reports good appetite and oral intake at baseline; pt reports eating 100% of breakfast this morning. RD discussed with pt the importance of adequate nutrition needed to preserve lean muscle and to support healing. RD will add supplements and MVI to help pt meet his estimated needs. Pt is willing to drink vanilla Ensure in hospital. Per chart, pt appears weight stable pta. Plan is for surgery once medically stable.     Medications reviewed and include: vitamin C, azithromycin, ceftin, B12, lovenox, folic acid, insulin, iron, synthroid, MVI, protonix, prednisone, vitamin D  Labs reviewed: Na 133(L), K 4.2 wnl, P 3.3 wnl, Mg 1.9 wnl Wbc- 31.5(H), Hgb 9.7(L), Hct 28.8(L) Cbgs- 294, 170 x 24 hrs  AIC 6.7(H)- 8/25  NUTRITION - FOCUSED PHYSICAL EXAM:  Flowsheet Row Most Recent Value  Orbital Region No depletion  Upper Arm Region No depletion  Thoracic and Lumbar Region No depletion  Buccal Region No depletion  Temple Region No depletion  Clavicle Bone Region No depletion  Clavicle and Acromion Bone Region No depletion  Scapular Bone Region No depletion   Dorsal Hand No depletion  Patellar Region No depletion  Anterior Thigh Region No depletion  Posterior Calf Region No depletion  Edema (RD Assessment) Mild  Hair Reviewed  Eyes Reviewed  Mouth Reviewed  Skin Reviewed  Nails Reviewed   Diet Order:   Diet Order             Diet regular Room service appropriate? Yes; Fluid consistency: Thin  Diet effective now                  EDUCATION NEEDS:   Education needs have been addressed  Skin:  Skin Assessment: Reviewed RN Assessment (ecchymosis)  Last BM:  pta  Height:   Ht Readings from Last 1 Encounters:  03/08/23 5\' 10"  (1.778 m)    Weight:   Wt Readings from Last 1 Encounters:  03/10/23 103.3 kg    Ideal Body Weight:  75.45 kg  BMI:  Body mass index is 32.68 kg/m.  Estimated Nutritional Needs:   Kcal:  2100-2400kcal/day  Protein:  105-120g/day  Fluid:  1.9-2.2L/day  Betsey Holiday MS, RD, LDN Please refer to [facility] for RD and/or RD on-call/weekend/after hours pager

## 2023-03-11 NOTE — Inpatient Diabetes Management (Signed)
Inpatient Diabetes Program Recommendations  AACE/ADA: New Consensus Statement on Inpatient Glycemic Control (2015)  Target Ranges:  Prepandial:   less than 140 mg/dL      Peak postprandial:   less than 180 mg/dL (1-2 hours)      Critically ill patients:  140 - 180 mg/dL   Lab Results  Component Value Date   GLUCAP 294 (H) 03/11/2023   HGBA1C 6.7 (H) 03/08/2023    Review of Glycemic Control  Latest Reference Range & Units 03/10/23 12:43 03/10/23 16:12 03/10/23 19:38 03/11/23 07:51 03/11/23 11:20  Glucose-Capillary 70 - 99 mg/dL 161 (H) 096 (H) 045 (H) 170 (H) 294 (H)   Diabetes history: DM 2 Outpatient Diabetes medications:  Actos 45 mg daily Metformin 1000 mg bid Januvia 50 mg daily Current orders for Inpatient glycemic control:  Novolog 0-20 units tid with meals  Prednisone 40 mg with breakfast Inpatient Diabetes Program Recommendations:    May consider reducing Novoog correction to moderate (0-15 units) tid with meals and HS and add Novolog meal coverage 4 units tid with meals (while on PO Prednisone) to cover PP elevations in blood sugars.    Thanks,  Beryl Meager, RN, BC-ADM Inpatient Diabetes Coordinator Pager [phone]  (8a-5p)

## 2023-03-12 DIAGNOSIS — J189 Pneumonia, unspecified organism: Secondary | ICD-10-CM | POA: Diagnosis not present

## 2023-03-12 LAB — BASIC METABOLIC PANEL
Anion gap: 9 (ref 5–15)
BUN: 17 mg/dL (ref 8–23)
CO2: 24 mmol/L (ref 22–32)
Calcium: 8.5 mg/dL — ABNORMAL LOW (ref 8.9–10.3)
Chloride: 98 mmol/L (ref 98–111)
Creatinine, Ser: 0.72 mg/dL (ref 0.61–1.24)
GFR, Estimated: >60 mL/min (ref >60)
Glucose, Bld: 195 mg/dL — ABNORMAL HIGH (ref 70–99)
Potassium: 3.8 mmol/L (ref 3.5–5.1)
Sodium: 131 mmol/L — ABNORMAL LOW (ref 135–145)

## 2023-03-12 LAB — OSMOLALITY: Osmolality: 282 mosm/kg (ref 275–295)

## 2023-03-12 LAB — CBC
HCT: 28.2 % — ABNORMAL LOW (ref 39.0–52.0)
Hemoglobin: 9.5 g/dL — ABNORMAL LOW (ref 13.0–17.0)
MCH: 29.6 pg (ref 26.0–34.0)
MCHC: 33.7 g/dL (ref 30.0–36.0)
MCV: 87.9 fL (ref 80.0–100.0)
Platelets: 317 10*3/uL (ref 150–400)
RBC: 3.21 MIL/uL — ABNORMAL LOW (ref 4.22–5.81)
RDW: 15.2 % (ref 11.5–15.5)
WBC: 52.3 10*3/uL (ref 4.0–10.5)
nRBC: 0 % (ref 0.0–0.2)

## 2023-03-12 LAB — HEPATIC FUNCTION PANEL
ALT: 101 U/L — ABNORMAL HIGH (ref 0–44)
AST: 91 U/L — ABNORMAL HIGH (ref 15–41)
Albumin: 2.7 g/dL — ABNORMAL LOW (ref 3.5–5.0)
Alkaline Phosphatase: 48 U/L (ref 38–126)
Bilirubin, Direct: 0.2 mg/dL (ref 0.0–0.2)
Indirect Bilirubin: 0.8 mg/dL (ref 0.3–0.9)
Total Bilirubin: 1 mg/dL (ref 0.3–1.2)
Total Protein: 5.7 g/dL — ABNORMAL LOW (ref 6.5–8.1)

## 2023-03-12 LAB — GLUCOSE, CAPILLARY
Glucose-Capillary: 176 mg/dL — ABNORMAL HIGH (ref 70–99)
Glucose-Capillary: 193 mg/dL — ABNORMAL HIGH (ref 70–99)
Glucose-Capillary: 252 mg/dL — ABNORMAL HIGH (ref 70–99)
Glucose-Capillary: 263 mg/dL — ABNORMAL HIGH (ref 70–99)

## 2023-03-12 LAB — MAGNESIUM: Magnesium: 1.8 mg/dL (ref 1.7–2.4)

## 2023-03-12 LAB — PHOSPHORUS: Phosphorus: 3 mg/dL (ref 2.5–4.6)

## 2023-03-12 LAB — CK: Total CK: 413 U/L — ABNORMAL HIGH (ref 49–397)

## 2023-03-12 MED ORDER — IPRATROPIUM-ALBUTEROL 0.5-2.5 (3) MG/3ML IN SOLN
3.0000 mL | Freq: Two times a day (BID) | RESPIRATORY_TRACT | Status: DC
  Administered 2023-03-12 – 2023-03-14 (×4): 3 mL via RESPIRATORY_TRACT
  Filled 2023-03-12 (×4): qty 3

## 2023-03-12 MED ORDER — MAGNESIUM SULFATE 2 GM/50ML IV SOLN
2.0000 g | Freq: Once | INTRAVENOUS | Status: AC
  Administered 2023-03-12: 2 g via INTRAVENOUS
  Filled 2023-03-12: qty 50

## 2023-03-12 NOTE — Consult Note (Signed)
PHARMACY CONSULT NOTE - ELECTROLYTES  Pharmacy Consult for Electrolyte Monitoring and Replacement   Recent Labs: Potassium (mmol/L)  Date Value  03/12/2023 3.8   Magnesium (mg/dL)  Date Value  08/65/7846 1.8   Calcium (mg/dL)  Date Value  96/29/5284 8.5 (L)   Albumin (g/dL)  Date Value  13/24/4010 2.7 (L)   Phosphorus (mg/dL)  Date Value  27/25/3664 3.0   Sodium (mmol/L)  Date Value  03/12/2023 131 (L)   Height: 5\' 10"  (177.8 cm) Weight: 107 kg (235 lb 14.3 oz) IBW/kg (Calculated) : 73 Estimated Creatinine Clearance: 90.2 mL/min (by C-G formula based on SCr of 0.72 mg/dL).  Assessment  Philip Robbins is a 82 y.o. male presenting with pneumonia and hyponatremia. PMH significant for CLL, BPH, HTN, DM, hypothyroidism, HLD. Pharmacy has been consulted to monitor and replace electrolytes.  Diet: Heart healthy MIVF: N/A Pertinent medications: N/A  Goal of Therapy: Electrolytes within normal limits  Plan:  Mag low at 1.8, will order Mag sulfate 2 g IV x 1 dose Follow-up electrolytes with AM labs tomorrow  Thank you for allowing pharmacy to be a part of this patient's care.  Merryl Hacker, PharmD Clinical Pharmacist 03/12/2023 7:11 AM

## 2023-03-12 NOTE — Progress Notes (Signed)
[facility] Progress Note  Patient: Philip Robbins    VWU:[id number]  DOA: 03/08/2023     Date of Service: the patient was seen and examined on 03/12/2023  Chief Complaint  Patient presents with   Fever   Brief hospital course:  OZIAH BOBST is a 82 y.o. male with medical history significant of CLL under surveillance, BPH, HTN, IIDM, hypothyroidism, HLD, chronic leukocytosis presented with altered mentations hypoxia.   His symptoms started last Friday, patient went to have a left neck lymph node biopsy, after the procedure patient developed URI-like symptoms with runny nose sore throat, yesterday, patient developed strong burning sensation in the urine and dark-colored urine and went to see urgent care was diagnosed with UTI and started on antibiotics.  Patient continued to feel malaise generalized weakness, nauseous but no vomiting and no abdominal pain overnight and this morning patient was found in the bed unresponsive with large vomitus on his bed and foams in his mouth.  Family called EMS, EMS arrived and found patient hypoxic O2 saturation 95% on room air and patient was placed on 15 L NRB and sent to ED.  Fever 101.8.  Wife also witnessed patient had some twitching movement of his arms and suspect whether he had seizure.  At baseline patient has BPH appears to be poorly controlled, he reported Brueggen screams and frequent nocturnal urination but no history of UTI before.   ED Course: Fever 101.2, tachycardia hypotensive oxygen saturation 91% on 6 L.  Chest x-ray showed bilateral infiltrates indicating for pneumonia.  WBC 35.1.  Hemoglobin 12, sodium 118, K4.1, bicarb 12, creatinine 1.1, troponin 64> 99, AST 67, ALT 46   Patient was given 3 L IV bolus and started on ceftriaxone, azithromycin Assessment and Plan:  Sepsis, severe, Resolved -Evidenced by new onset of hypoxia, elevated temperature and elevated lactic acid with signs of endorgan damage of acute encephalopathy and source  of infection likely aspiration pneumonia and UTI. s/p IVF given as per protocol.  -Continue ceftriaxone and azithromycin to cover pneumonia and UTI for 5 days   Acute hypoxic respiratory failure secondary to pneumonia and COPD/asthma exacerbation  Patient does not have diagnosis of COPD or asthma Patient was wheezing, resolved on 8/28 Continue above antibiotics for pneumonia 8/27  prednisone 40 mg p.o. daily for 3 days Started Breo Ellipta inhaler and DuoNeb TID 8/28 respiratory failure resolved, patient saturating well on room air. 8/29 dyspnea on exertion, hypoxia on ambulation, continue supplemental O2 inhalation as needed  Aspiration pneumonia -Likely secondary to repeated vomiting at night, management as above  UTI -Likely secondary to uncontrolled BPH, continue antibiotics -Check PVR, outpatient follow-up with urology for urodynamic study -Continue Flomax  acute hypotonic hyponatremia -Hypovolemic, likely secondary to acute GI loss from repeated vomiting overnight and sepsis Serum osmolality 257 low, TSH 2.6 within normal range Nephro consulted, s/p saline 3% IVF discontinued on 8/27 Na 118------131 Sodium level gradually improving, continue to monitor. Continue fluid restriction 1.5 L/day   # Hypophosphatemia, Phos repleted.  Resolved # Hypomagnesemia, mag repleted.  Resolved Monitor electrolytes daily.  And replete as needed  # B/L Humral fracture No h/o fall, possible trauma while transportation as pt was combative and he was held strongly by EMS.  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  Repeat  sodium level and likely will start hypertonic saline, consult nephrology. -Seizure precaution.   Troponin elevation -No  chest pains, EKG showed no acute ST changes and the poor R wave progression appears to be chronic. -Etiology likely related to sepsis and acute hypoxia and demanding ischemia.  Treat sepsis, repeat troponin this evening.   CLL with chronic leukocytosis -Peripheral smear is being reviewed to rule out acute transformation -Management of sepsis as above 8/29 WBC count is significantly elevated, could be due to steroids.  Monitor CBC daily  HTN Blood pressure improved 8/27 resumed lisinopril 20 mg p.o. 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 weekly, follow with PCP to repeat vitamin D level after 3 to 6 months.  Vitamin B12 deficiency: Started vitamin B12 1000 mcg oral supplement, avoided IM injection due to bilateral humeral fractures Follow-up PCP to repeat vitamin B12 level after 3 to 6 months.  Iron deficiency, transferrin saturation 6%, started oral iron supplement.  Avoided IV iron due to active infection. Follow-up with PCP to repeat iron profile after 3 to 6 months Folic acid level 8.3, at lower end, started oral supplement for 3 months.   Body mass index is 33.85 kg/m.  Interventions:  Diet: Heart healthy diet DVT Prophylaxis: Subcutaneous Lovenox   Advance goals of care discussion: Full code  Family Communication: family was present at bedside, at the time of interview.  The pt provided permission to discuss medical plan with the family. Opportunity was given to ask question and all questions were answered satisfactorily.   Disposition:  Pt is from Home, admitted with hyponatremia, pneumonia, respiratory failure, UTI, b/l.  Humerus fracture, still has resp failure and low sodium, which precludes a safe discharge. Discharge to SNF , when medically  stable.  Subjective: No significant overnight events.  Patient was laying comfortably in the bed, patient was having dyspnea on exertion, developed hypoxia on ambulation so patient was on 2 L oxygen by nasal cannula.  Shoulder pain is under control at rest but it hurts on movement.  Patient is motivated to work with physical therapy and agree for left shoulder surgery tomorrow a.m.   Physical Exam: General: NAD, lying comfortably Appear in no distress, affect appropriate Eyes: PERRLA ENT: Oral Mucosa Clear, moist  Neck: no JVD,  Cardiovascular: S1 and S2 Present, no Murmur,  Respiratory: Good air entry bilaterally, b/l  crackles and mild wheezing. Abdomen: Bowel Sound present, Soft and no tenderness,  Skin: no rashes Extremities: no Pedal edema, no calf tenderness.  Bilateral shoulders slings intact Neurologic: without any new focal findings Gait not checked due to patient safety concerns  Vitals:   03/12/23 0500 03/12/23 0605 03/12/23 0836 03/12/23 1137  BP:  132/60 133/62 137/61  Pulse:  63 77 75  Resp:   20 18  Temp:  (!) 97.5 F (36.4 C) 98.7 F (37.1 C) 98.6 F (37 C)  TempSrc:  Oral  Oral  SpO2:  97% 92% 93%  Weight: 107 kg     Height:        Intake/Output Summary (Last 24 hours) at 03/12/2023 1530 Last data filed at 03/12/2023 1406 Gross per 24 hour  Intake 200 ml  Output 1500 ml  Net -1300 ml   Filed Weights   03/08/23 1314 03/10/23 0400 03/12/23 0500  Weight: 105.9 kg 103.3 kg  107 kg    Data Reviewed: I have personally reviewed and interpreted daily labs, tele strips, imagings as discussed above. I reviewed all nursing notes, pharmacy notes, vitals, pertinent old records I have discussed plan of care as described above with RN and patient/family.  CBC: Recent Labs  Lab 03/08/23 0620 03/09/23 0436 03/10/23 0430 03/11/23 0418 03/12/23 0450  WBC 35.1* 34.7* 29.7* 31.5* 52.3*  NEUTROABS 19.8*  --   --   --   --   HGB 12.1* 9.9* 10.5* 9.7* 9.5*  HCT  36.5* 29.7* 31.5* 28.8* 28.2*  MCV 90.1 87.6 88.5 87.3 87.9  PLT 218 200 243 267 317   Basic Metabolic Panel: Recent Labs  Lab 03/08/23 1110 03/08/23 1353 03/09/23 0436 03/09/23 0927 03/10/23 0018 03/10/23 0430 03/10/23 0918 03/11/23 0418 03/12/23 0450  NA 118*   < > 123*   < > 130* 132* 132* 133* 131*  K 4.1  --  4.2  --   --  4.0  --  4.2 3.8  CL 93*  --  96*  --   --  103  --  104 98  CO2 18*  --  22  --   --  22  --  23 24  GLUCOSE 184*  --  148*  --   --  148*  --  203* 195*  BUN 13  --  13  --   --  11  --  12 17  CREATININE 0.85  --  0.79  --   --  0.75  --  0.63 0.72  CALCIUM 8.1*  --  8.2*  --   --  8.4*  --  8.4* 8.5*  MG  --   --  1.7  --   --  1.5*  --  1.9 1.8  PHOS  --   --  2.2*  --   --  1.8*  --  3.3 3.0   < > = values in this interval not displayed.    Studies: No results found.  Scheduled Meds:  vitamin C  500 mg Oral Daily   azithromycin  500 mg Oral Daily   cefUROXime  500 mg Oral BID WC   Chlorhexidine Gluconate Cloth  6 each Topical Q0600   vitamin B-12  1,000 mcg Oral Daily   diclofenac Sodium  2 g Topical QID   fluticasone furoate-vilanterol  1 puff Inhalation Daily   folic acid  1 mg Oral Daily   guaiFENesin  600 mg Oral BID   insulin aspart  0-15 Units Subcutaneous TID WC   insulin aspart  0-5 Units Subcutaneous QHS   insulin aspart  4 Units Subcutaneous TID WC   ipratropium-albuterol  3 mL Nebulization BID   iron polysaccharides  150 mg Oral Daily   levothyroxine  50 mcg Oral Q0600   lisinopril  20 mg Oral Daily   multivitamin with minerals  1 tablet Oral Daily   oxymetazoline  1 [place] Each Nare BID   pantoprazole  40 mg Oral BID   Followed by   Melene Muller ON 03/15/2023] pantoprazole  40 mg Oral Daily   Ensure Max Protein  11 oz Oral BID   tamsulosin  0.4 mg Oral QPC supper   Vitamin D (Ergocalciferol)  50,000 Units Oral Q7 days   Continuous Infusions:   PRN Meds: acetaminophen, chlorpheniramine-HYDROcodone, hydrALAZINE, HYDROmorphone  (DILAUDID) injection, ketorolac, ondansetron **OR** ondansetron (ZOFRAN) IV  Time spent: 40 minutes  Author: Gillis Santa. MD [facility] 03/12/2023 3:30  PM  To reach On-call, see care teams to locate the attending and reach out to them via [email or web address]. If 7PM-7AM, please contact night-coverage If you still have difficulty reaching the attending provider, please page the [facility] (Director on Call) for [facility] on amion for assistance.

## 2023-03-12 NOTE — Progress Notes (Signed)
Physical Therapy Treatment Patient Details Name: Philip Robbins MRN: [id number] DOB: Nov 04, 1940 Today's Date: 03/12/2023   History of Present Illness Pt is an 82 y.o. male presenting to hospital 03/08/23 with c/o AMS and hypoxia; wife concerned pt was having a seizure.  Imaging showing: Comminuted fracture of left humeral head and neck (Posterior subluxation of the humeral head, with anterior displacement of the humeral neck); mildly displaced, comminuted fracture of the right humeral head and neck.  Per notes (prior to hospitalization) "Patient went for a left neck lymph node biopsy on Friday. Afterwards, patient started to have upper respiratory infection symptoms. Then next day, he started having urinary infection symptoms. Was taken to urgent care where he was diagnosed with urinary tract infection and started on levofloxacin. Patient last night became hypoxic on room air, febrile and possible seizure".  Pt admitted with severe sepsis, acute hyponatremia, question seizure, aspiration PNA, UTI.  PMH includes UTI, CLL under surveillance, BPH, htn, IIDM, hypothyroidism, HLD, chronic leukocytosis.    PT Comments  Patient supine in bed on arrival. Required modA for bed mobility due to inability to utilize B UE. In sitting, readjusted slings to proper fit for ambulation. Ambulated 180' with CGA and no AD, mild balance deficits 2/2 lack of arm swing. Discussion with patient and wife included discharge planning and both wanting SNF prior to returning home. Updated discharge recommendation to reflect patient request and functional needs.     If plan is discharge home, recommend the following: A little help with walking and/or transfers;A little help with bathing/dressing/bathroom;Assistance with cooking/housework;Assistance with feeding;Direct supervision/assist for medications management;Assist for transportation;Help with stairs or ramp for entrance   Can travel by private vehicle     Yes  Equipment  Recommendations  None recommended by PT    Recommendations for Other Services       Precautions / Restrictions Precautions Precautions: Fall Precaution Comments: B UE's in shoulder slings; seizure precautions Required Braces or Orthoses: Sling Restrictions Weight Bearing Restrictions: Yes RUE Weight Bearing: Non weight bearing LUE Weight Bearing: Non weight bearing Other Position/Activity Restrictions: per ortho note 8/27: "R shoulder - Will plan to to treat nonoperatively with NWB in a sling. He may use hand for basic ADLs as long as elbow remains by side and hand is in front of the body. He should not experience any shoulder pain with activity.   L shoulder - Will plan for operative management given fracture/dislocation. Currently, patient is not medically cleared for surgery. Per primary team, anticipate being cleared on 8/29 or 8/30. Will tentatively plan for L reverse shoulder arthroplasty on 03/13/23. NWB in sling until then"     Mobility  Bed Mobility Overal bed mobility: Needs Assistance Bed Mobility: Supine to Sit, Sit to Supine     Supine to sit: Mod assist, HOB elevated Sit to supine: Mod assist        Transfers Overall transfer level: Needs assistance Equipment used: None Transfers: Sit to/from Stand Sit to Stand: Contact guard assist                Ambulation/Gait Ambulation/Gait assistance: Contact guard assist Gait Distance (Feet): 180 Feet Assistive device: None Gait Pattern/deviations: Step-through pattern, Decreased stride length Gait velocity: decreased     General Gait Details: cues for slower pace due to balane deficits noted with lack of arm swing.   Stairs             Wheelchair Mobility     Tilt Bed  Modified Rankin (Stroke Patients Only)       Balance Overall balance assessment: Needs assistance Sitting-balance support: No upper extremity supported, Feet supported Sitting balance-Leahy Scale: Good Sitting balance -  Comments: steady static sitting   Standing balance support: No upper extremity supported Standing balance-Leahy Scale: Fair                              Cognition Arousal: Alert Behavior During Therapy: WFL for tasks assessed/performed Overall Cognitive Status: Within Functional Limits for tasks assessed                                          Exercises      General Comments        Pertinent Vitals/Pain Pain Assessment Pain Assessment: Faces Faces Pain Scale: Hurts even more Pain Location: B shoulders (R>L) Pain Descriptors / Indicators: Aching Pain Intervention(s): Monitored during session, Repositioned    Home Living                          Prior Function            PT Goals (current goals can now be found in the care plan section) Acute Rehab PT Goals Patient Stated Goal: to improve mobility PT Goal Formulation: With patient Time For Goal Achievement: 03/24/23 Potential to Achieve Goals: Good Progress towards PT goals: Progressing toward goals    Frequency    Min 1X/week      PT Plan      Co-evaluation              AM-PAC PT "6 Clicks" Mobility   Outcome Measure  Help needed turning from your back to your side while in a flat bed without using bedrails?: A Lot Help needed moving from lying on your back to sitting on the side of a flat bed without using bedrails?: A Lot Help needed moving to and from a bed to a chair (including a wheelchair)?: A Little Help needed standing up from a chair using your arms (e.g., wheelchair or bedside chair)?: A Little Help needed to walk in hospital room?: A Little Help needed climbing 3-5 steps with a railing? : A Lot 6 Click Score: 15    End of Session Equipment Utilized During Treatment: Gait belt Activity Tolerance: Patient tolerated treatment well Patient left: in bed;with call bell/phone within reach;with bed alarm set;with family/visitor present Nurse  Communication: Mobility status PT Visit Diagnosis: Unsteadiness on feet (R26.81);Other abnormalities of gait and mobility (R26.89);Muscle weakness (generalized) (M62.81);Pain     Time: 1020-1045 PT Time Calculation (min) (ACUTE ONLY): 25 min  Charges:    $Therapeutic Activity: 23-37 mins PT General Charges $$ ACUTE PT VISIT: 1 Visit                     Maylon Peppers, PT, DPT Physical Therapist - [facility]  [facility]    Othel Hoogendoorn A Reese Stockman 03/12/2023, 10:54 AM

## 2023-03-12 NOTE — Progress Notes (Signed)
Occupational Therapy Treatment Patient Details Name: Philip Robbins MRN: [id number] DOB: 1940/11/13 Today's Date: 03/12/2023   History of present illness Pt is an 82 y.o. male presenting to hospital 03/08/23 with c/o AMS and hypoxia; wife concerned pt was having a seizure.  Imaging showing: Comminuted fracture of left humeral head and neck (Posterior subluxation of the humeral head, with anterior displacement of the humeral neck); mildly displaced, comminuted fracture of the right humeral head and neck.  Per notes (prior to hospitalization) "Patient went for a left neck lymph node biopsy on Friday. Afterwards, patient started to have upper respiratory infection symptoms. Then next day, he started having urinary infection symptoms. Was taken to urgent care where he was diagnosed with urinary tract infection and started on levofloxacin. Patient last night became hypoxic on room air, febrile and possible seizure".  Pt admitted with severe sepsis, acute hyponatremia, question seizure, aspiration PNA, UTI.  PMH includes UTI, CLL under surveillance, BPH, htn, IIDM, hypothyroidism, HLD, chronic leukocytosis.   OT comments  Chart revived to date, pt greeted in room, requesting to transfer to chair. Pt is alert and oriented x4, improved awareness of deficits and safety noted on this date. Pt reports he is scheduled for L shoulder surgery. Tx session targeted improving functional activity tolerance and to improve compensator techniques for ADLs. Pt continues to require MAX A for sling management and pain throughout RUE. CGA required for short amb transfers in room. Pt continues to require MAX A for feeding/grooming tasks. Pt is left in bedside chair, all needs met. OT will continue to follow acutely.       If plan is discharge home, recommend the following:  A lot of help with walking and/or transfers;A lot of help with bathing/dressing/bathroom;Assistance with cooking/housework;Direct supervision/assist for  medications management;Assist for transportation;Direct supervision/assist for financial management;Help with stairs or ramp for entrance;Assistance with feeding   Equipment Recommendations  BSC/3in1;Wheelchair (measurements OT);Tub/shower seat    Recommendations for Other Services      Precautions / Restrictions Precautions Precautions: Fall Precaution Comments: B UE's in shoulder slings; seizure precautions Required Braces or Orthoses: Sling Restrictions Weight Bearing Restrictions: Yes RUE Weight Bearing: Non weight bearing LUE Weight Bearing: Non weight bearing Other Position/Activity Restrictions: per ortho note 8/27: "R shoulder - Will plan to to treat nonoperatively with NWB in a sling. He may use hand for basic ADLs as long as elbow remains by side and hand is in front of the body. He should not experience any shoulder pain with activity.   L shoulder - Will plan for operative management given fracture/dislocation. Currently, patient is not medically cleared for surgery. Per primary team, anticipate being cleared on 8/29 or 8/30. Will tentatively plan for L reverse shoulder arthroplasty on 03/13/23. NWB in sling until then"       Mobility Bed Mobility Overal bed mobility: Needs Assistance Bed Mobility: Supine to Sit     Supine to sit: Mod assist, HOB elevated (HOB max elevation)          Transfers Overall transfer level: Needs assistance Equipment used: None Transfers: Sit to/from Stand Sit to Stand: Contact guard assist, Min assist                 Balance Overall balance assessment: Needs assistance Sitting-balance support: No upper extremity supported, Feet supported Sitting balance-Leahy Scale: Good     Standing balance support: No upper extremity supported Standing balance-Leahy Scale: Fair  ADL either performed or assessed with clinical judgement   ADL Overall ADL's : Needs assistance/impaired     Grooming:  Wash/dry face;Maximal assistance           Upper Body Dressing : Maximal assistance Upper Body Dressing Details (indicate cue type and reason): adjust B slings     Toilet Transfer: Contact guard assist;Ambulation Toilet Transfer Details (indicate cue type and reason): simulated, short amb transfer to bedside chair         Functional mobility during ADLs: Contact guard assist      Extremity/Trunk Assessment              Vision       Perception     Praxis      Cognition Arousal: Alert Behavior During Therapy: WFL for tasks assessed/performed Overall Cognitive Status: Within Functional Limits for tasks assessed                                          Exercises Other Exercises Other Exercises: edu re: role of rehab, rehab following now planned L shoulder surgery, discharge recommendations    Shoulder Instructions       General Comments spo2 >90% on 2L via oxygen throughout    Pertinent Vitals/ Pain       Pain Assessment Pain Assessment: Faces Faces Pain Scale: Hurts whole lot Pain Location: BUE with activity Pain Descriptors / Indicators: Discomfort Pain Intervention(s): Monitored during session, Repositioned  Home Living                                          Prior Functioning/Environment              Frequency  Min 1X/week        Progress Toward Goals  OT Goals(current goals can now be found in the care plan section)  Progress towards OT goals: Progressing toward goals     Plan      Co-evaluation                 AM-PAC OT "6 Clicks" Daily Activity     Outcome Measure   Help from another person eating meals?: A Lot Help from another person taking care of personal grooming?: A Lot Help from another person toileting, which includes using toliet, bedpan, or urinal?: A Lot Help from another person bathing (including washing, rinsing, drying)?: A Lot Help from another person to put on and  taking off regular upper body clothing?: A Lot Help from another person to put on and taking off regular lower body clothing?: A Lot 6 Click Score: 12    End of Session Equipment Utilized During Treatment: Oxygen;Other (comment) (B slings)  OT Visit Diagnosis: Muscle weakness (generalized) (M62.81);Other abnormalities of gait and mobility (R26.89)   Activity Tolerance Patient tolerated treatment well   Patient Left in chair;with call bell/phone within reach;with family/visitor present;with chair alarm set   Nurse Communication  (tech re mobility status)        Time: 6237-6283 OT Time Calculation (min): 21 min  Charges: OT General Charges $OT Visit: 1 Visit OT Treatments $Therapeutic Activity: 8-22 mins  Oleta Mouse, OTD OTR/L  03/12/23, 1:05 PM

## 2023-03-12 NOTE — Plan of Care (Signed)
  Problem: Respiratory: Goal: Ability to maintain adequate ventilation will improve Outcome: Progressing   Problem: Coping: Goal: Ability to adjust to condition or change in health will improve Outcome: Progressing   Problem: Nutritional: Goal: Maintenance of adequate nutrition will improve Outcome: Progressing   Problem: Skin Integrity: Goal: Risk for impaired skin integrity will decrease Outcome: Progressing

## 2023-03-12 NOTE — Care Management Important Message (Signed)
Important Message  Patient Details  Name: Philip Robbins MRN: [id number] Date of Birth: December 26, 1940   Medicare Important Message Given:  Yes     [name] 03/12/2023, 2:52 PM

## 2023-03-12 NOTE — Plan of Care (Signed)

## 2023-03-12 NOTE — TOC Progression Note (Signed)
Transition of Care [facility]) - Progression Note    Patient Details  Name: Philip Robbins MRN: [id number] Date of Birth: Mar 05, 1941  Transition of Care [facility]) CM/SW Contact  Margarito Liner, LCSW Phone Number: 03/12/2023, 3:29 PM  Clinical Narrative:  Patient and wife are agreeable to SNF recommendations. First preference is [facility] due to proximity to their home. Second preference is [facility]. CSW did inform them that [facility] is currently holding beds for their ILF patients right now. Will start referral process after his procedure tomorrow.   Expected Discharge Plan: Home w Home Health Services Barriers to Discharge: Continued Medical Work up  Expected Discharge Plan and Services     Post Acute Care Choice: Home Health Living arrangements for the past 2 months: Single Family Home                           HH Arranged: PT HH Agency: [facility] Date [facility] Agency Contacted: 03/11/23   Representative spoke with at [facility] Agency: [place]   Social Determinants of Health (SDOH) Interventions SDOH Screenings   Food Insecurity: No Food Insecurity (03/10/2023)  Housing: Low Risk  (03/10/2023)  Transportation Needs: No Transportation Needs (03/10/2023)  Utilities: Not At Risk (03/10/2023)    Readmission Risk Interventions     No data to display

## 2023-03-13 ENCOUNTER — Inpatient Hospital Stay: Payer: Medicare Other

## 2023-03-13 ENCOUNTER — Other Ambulatory Visit: Payer: Self-pay

## 2023-03-13 ENCOUNTER — Inpatient Hospital Stay: Payer: Medicare Other | Admitting: Anesthesiology

## 2023-03-13 ENCOUNTER — Encounter: Payer: Self-pay | Admitting: Internal Medicine

## 2023-03-13 ENCOUNTER — Encounter
Admission: EM | Disposition: A | Discharge status: Skilled Nursing Facility | Payer: Self-pay | Source: Home / Self Care | Attending: Student

## 2023-03-13 DIAGNOSIS — J189 Pneumonia, unspecified organism: Secondary | ICD-10-CM | POA: Diagnosis not present

## 2023-03-13 HISTORY — PX: REVERSE SHOULDER ARTHROPLASTY: SHX5054

## 2023-03-13 LAB — BASIC METABOLIC PANEL
Anion gap: 14 (ref 5–15)
BUN: 22 mg/dL (ref 8–23)
CO2: 24 mmol/L (ref 22–32)
Calcium: 9.2 mg/dL (ref 8.9–10.3)
Chloride: 96 mmol/L — ABNORMAL LOW (ref 98–111)
Creatinine, Ser: 0.79 mg/dL (ref 0.61–1.24)
GFR, Estimated: >60 mL/min (ref >60)
Glucose, Bld: 269 mg/dL — ABNORMAL HIGH (ref 70–99)
Potassium: 4.3 mmol/L (ref 3.5–5.1)
Sodium: 134 mmol/L — ABNORMAL LOW (ref 135–145)

## 2023-03-13 LAB — CULTURE, BLOOD (ROUTINE X 2)
Culture: NO GROWTH
Culture: NO GROWTH
Special Requests: ADEQUATE
Special Requests: ADEQUATE

## 2023-03-13 LAB — CBC
HCT: 28.8 % — ABNORMAL LOW (ref 39.0–52.0)
Hemoglobin: 9.6 g/dL — ABNORMAL LOW (ref 13.0–17.0)
MCH: 29.2 pg (ref 26.0–34.0)
MCHC: 33.3 g/dL (ref 30.0–36.0)
MCV: 87.5 fL (ref 80.0–100.0)
Platelets: 363 10*3/uL (ref 150–400)
RBC: 3.29 MIL/uL — ABNORMAL LOW (ref 4.22–5.81)
RDW: 15.2 % (ref 11.5–15.5)
WBC: 54.1 10*3/uL (ref 4.0–10.5)
nRBC: 0 % (ref 0.0–0.2)

## 2023-03-13 LAB — GLUCOSE, CAPILLARY
Glucose-Capillary: 224 mg/dL — ABNORMAL HIGH (ref 70–99)
Glucose-Capillary: 195 mg/dL — ABNORMAL HIGH (ref 70–99)
Glucose-Capillary: 240 mg/dL — ABNORMAL HIGH (ref 70–99)
Glucose-Capillary: 253 mg/dL — ABNORMAL HIGH (ref 70–99)
Glucose-Capillary: 271 mg/dL — ABNORMAL HIGH (ref 70–99)
Glucose-Capillary: 286 mg/dL — ABNORMAL HIGH (ref 70–99)

## 2023-03-13 LAB — PHOSPHORUS: Phosphorus: 3.4 mg/dL (ref 2.5–4.6)

## 2023-03-13 LAB — MAGNESIUM: Magnesium: 1.8 mg/dL (ref 1.7–2.4)

## 2023-03-13 SURGERY — ARTHROPLASTY, SHOULDER, TOTAL, REVERSE
Anesthesia: General | Site: Shoulder | Laterality: Left

## 2023-03-13 MED ORDER — PROPOFOL 10 MG/ML IV BOLUS
INTRAVENOUS | Status: AC
  Filled 2023-03-13: qty 20

## 2023-03-13 MED ORDER — TRANEXAMIC ACID-NACL 1000-0.7 MG/100ML-% IV SOLN
1000.0000 mg | Freq: Once | INTRAVENOUS | Status: AC
  Administered 2023-03-17: 1000 mg via INTRAVENOUS
  Filled 2023-03-13: qty 100

## 2023-03-13 MED ORDER — FENTANYL CITRATE (PF) 100 MCG/2ML IJ SOLN
INTRAMUSCULAR | Status: AC
  Filled 2023-03-13: qty 2

## 2023-03-13 MED ORDER — BUPIVACAINE-EPINEPHRINE (PF) 0.25% -1:200000 IJ SOLN
INTRAMUSCULAR | Status: DC | PRN
  Administered 2023-03-13: 50 mL via INTRAMUSCULAR

## 2023-03-13 MED ORDER — ACETAMINOPHEN 500 MG PO TABS
1000.0000 mg | ORAL_TABLET | Freq: Three times a day (TID) | ORAL | Status: AC
  Administered 2023-03-13 – 2023-03-14 (×4): 1000 mg via ORAL
  Filled 2023-03-13 (×4): qty 2

## 2023-03-13 MED ORDER — CEFAZOLIN SODIUM-DEXTROSE 2-4 GM/100ML-% IV SOLN
2.0000 g | Freq: Once | INTRAVENOUS | Status: AC
  Administered 2023-03-13: 2 g via INTRAVENOUS

## 2023-03-13 MED ORDER — SODIUM CHLORIDE 0.9 % IV SOLN: INTRAVENOUS | Status: DC

## 2023-03-13 MED ORDER — ONDANSETRON HCL 4 MG/2ML IJ SOLN
INTRAMUSCULAR | Status: DC | PRN
  Administered 2023-03-13: 4 mg via INTRAVENOUS

## 2023-03-13 MED ORDER — ALUM & MAG HYDROXIDE-SIMETH 200-200-20 MG/5ML PO SUSP
30.0000 mL | ORAL | Status: DC | PRN
  Administered 2023-03-20: 30 mL via ORAL
  Filled 2023-03-13: qty 30

## 2023-03-13 MED ORDER — OXYCODONE HCL 5 MG/5ML PO SOLN: 5.0000 mg | Freq: Once | ORAL | Status: DC | PRN

## 2023-03-13 MED ORDER — VANCOMYCIN HCL 1000 MG IV SOLR
INTRAVENOUS | Status: AC
  Filled 2023-03-13: qty 20

## 2023-03-13 MED ORDER — MAGNESIUM SULFATE 2 GM/50ML IV SOLN
2.0000 g | Freq: Once | INTRAVENOUS | Status: AC
  Administered 2023-03-13: 2 g via INTRAVENOUS
  Filled 2023-03-13: qty 50

## 2023-03-13 MED ORDER — SODIUM CHLORIDE 0.9 % IR SOLN
Status: DC | PRN
  Administered 2023-03-13: 3000 mL

## 2023-03-13 MED ORDER — ONDANSETRON HCL 4 MG/2ML IJ SOLN: 4.0000 mg | Freq: Four times a day (QID) | INTRAMUSCULAR | Status: DC | PRN

## 2023-03-13 MED ORDER — SODIUM CHLORIDE 0.9 % IV SOLN: INTRAVENOUS | Status: DC | PRN

## 2023-03-13 MED ORDER — DEXAMETHASONE SODIUM PHOSPHATE 10 MG/ML IJ SOLN
INTRAMUSCULAR | Status: AC
  Filled 2023-03-13: qty 1

## 2023-03-13 MED ORDER — LIDOCAINE HCL (PF) 2 % IJ SOLN
INTRAMUSCULAR | Status: AC
  Filled 2023-03-13: qty 10

## 2023-03-13 MED ORDER — OXYCODONE HCL 5 MG PO TABS
5.0000 mg | ORAL_TABLET | ORAL | Status: DC | PRN
  Administered 2023-03-14: 5 mg via ORAL
  Administered 2023-03-15 – 2023-03-17 (×3): 10 mg via ORAL
  Filled 2023-03-13: qty 2
  Filled 2023-03-13 (×2): qty 1
  Filled 2023-03-13: qty 2
  Filled 2023-03-13: qty 1
  Filled 2023-03-13: qty 2

## 2023-03-13 MED ORDER — ROCURONIUM BROMIDE 10 MG/ML (PF) SYRINGE
PREFILLED_SYRINGE | INTRAVENOUS | Status: AC
  Filled 2023-03-13: qty 10

## 2023-03-13 MED ORDER — OXYCODONE HCL 5 MG PO TABS: 5.0000 mg | ORAL_TABLET | Freq: Once | ORAL | Status: DC | PRN

## 2023-03-13 MED ORDER — 0.9 % SODIUM CHLORIDE (POUR BTL) OPTIME
TOPICAL | Status: DC | PRN
  Administered 2023-03-13: 1000 mL

## 2023-03-13 MED ORDER — METOCLOPRAMIDE HCL 5 MG PO TABS: 5.0000 mg | ORAL_TABLET | Freq: Three times a day (TID) | ORAL | Status: DC | PRN

## 2023-03-13 MED ORDER — HYDROMORPHONE HCL 1 MG/ML IJ SOLN: 0.5000 mg | INTRAMUSCULAR | Status: DC | PRN

## 2023-03-13 MED ORDER — LIDOCAINE HCL (CARDIAC) PF 100 MG/5ML IV SOSY
PREFILLED_SYRINGE | INTRAVENOUS | Status: DC | PRN
  Administered 2023-03-13: 100 mg via INTRAVENOUS

## 2023-03-13 MED ORDER — FUROSEMIDE 10 MG/ML IJ SOLN
40.0000 mg | Freq: Two times a day (BID) | INTRAMUSCULAR | Status: AC
  Administered 2023-03-13 – 2023-03-15 (×4): 40 mg via INTRAVENOUS
  Filled 2023-03-13 (×4): qty 4

## 2023-03-13 MED ORDER — HYDROMORPHONE HCL 1 MG/ML IJ SOLN
0.2000 mg | INTRAMUSCULAR | Status: DC | PRN
  Administered 2023-03-13 – 2023-03-18 (×7): 0.4 mg via INTRAVENOUS
  Filled 2023-03-13 (×8): qty 0.5

## 2023-03-13 MED ORDER — FENTANYL CITRATE (PF) 100 MCG/2ML IJ SOLN: 25.0000 ug | INTRAMUSCULAR | Status: DC | PRN

## 2023-03-13 MED ORDER — ENOXAPARIN SODIUM 40 MG/0.4ML IJ SOSY
40.0000 mg | PREFILLED_SYRINGE | INTRAMUSCULAR | Status: AC
  Administered 2023-03-14 – 2023-03-15 (×2): 40 mg via SUBCUTANEOUS
  Filled 2023-03-13 (×2): qty 0.4

## 2023-03-13 MED ORDER — VANCOMYCIN HCL 1000 MG IV SOLR
INTRAVENOUS | Status: DC | PRN
  Administered 2023-03-13: 1000 mg via TOPICAL

## 2023-03-13 MED ORDER — CEFAZOLIN SODIUM-DEXTROSE 2-4 GM/100ML-% IV SOLN
INTRAVENOUS | Status: AC
  Filled 2023-03-13: qty 100

## 2023-03-13 MED ORDER — SUGAMMADEX SODIUM 200 MG/2ML IV SOLN
INTRAVENOUS | Status: DC | PRN
  Administered 2023-03-13: 50 mg via INTRAVENOUS
  Administered 2023-03-13: 200 mg via INTRAVENOUS

## 2023-03-13 MED ORDER — MENTHOL 3 MG MT LOZG: 1.0000 | LOZENGE | OROMUCOSAL | Status: DC | PRN

## 2023-03-13 MED ORDER — FENTANYL CITRATE (PF) 100 MCG/2ML IJ SOLN
INTRAMUSCULAR | Status: DC | PRN
  Administered 2023-03-13 (×5): 25 ug via INTRAVENOUS
  Administered 2023-03-13: 75 ug via INTRAVENOUS
  Administered 2023-03-13: 25 ug via INTRAVENOUS

## 2023-03-13 MED ORDER — ONDANSETRON HCL 4 MG PO TABS: 4.0000 mg | ORAL_TABLET | Freq: Four times a day (QID) | ORAL | Status: DC | PRN

## 2023-03-13 MED ORDER — DOCUSATE SODIUM 100 MG PO CAPS
100.0000 mg | ORAL_CAPSULE | Freq: Two times a day (BID) | ORAL | Status: DC
  Administered 2023-03-13 – 2023-03-20 (×13): 100 mg via ORAL
  Filled 2023-03-13 (×13): qty 1

## 2023-03-13 MED ORDER — PHENYLEPHRINE HCL-NACL 20-0.9 MG/250ML-% IV SOLN
INTRAVENOUS | Status: AC
  Filled 2023-03-13: qty 250

## 2023-03-13 MED ORDER — OXYCODONE HCL 5 MG PO TABS
10.0000 mg | ORAL_TABLET | ORAL | Status: DC | PRN
  Administered 2023-03-13 – 2023-03-16 (×7): 15 mg via ORAL
  Administered 2023-03-17: 10 mg via ORAL
  Administered 2023-03-18: 15 mg via ORAL
  Administered 2023-03-18: 10 mg via ORAL
  Administered 2023-03-19 (×2): 15 mg via ORAL
  Filled 2023-03-13 (×4): qty 3
  Filled 2023-03-13 (×2): qty 2
  Filled 2023-03-13: qty 3
  Filled 2023-03-13 (×2): qty 2
  Filled 2023-03-13 (×4): qty 3

## 2023-03-13 MED ORDER — DEXAMETHASONE SODIUM PHOSPHATE 10 MG/ML IJ SOLN
INTRAMUSCULAR | Status: DC | PRN
  Administered 2023-03-13: 5 mg via INTRAVENOUS

## 2023-03-13 MED ORDER — SENNOSIDES-DOCUSATE SODIUM 8.6-50 MG PO TABS
1.0000 | ORAL_TABLET | Freq: Every evening | ORAL | Status: DC | PRN
  Administered 2023-03-20: 1 via ORAL
  Filled 2023-03-13: qty 1

## 2023-03-13 MED ORDER — BUPIVACAINE-EPINEPHRINE (PF) 0.25% -1:200000 IJ SOLN
INTRAMUSCULAR | Status: AC
  Filled 2023-03-13: qty 30

## 2023-03-13 MED ORDER — PROPOFOL 10 MG/ML IV BOLUS
INTRAVENOUS | Status: DC | PRN
  Administered 2023-03-13: 30 mg via INTRAVENOUS
  Administered 2023-03-13: 120 mg via INTRAVENOUS
  Administered 2023-03-13: 20 mg via INTRAVENOUS

## 2023-03-13 MED ORDER — PHENYLEPHRINE HCL-NACL 20-0.9 MG/250ML-% IV SOLN
INTRAVENOUS | Status: DC | PRN
  Administered 2023-03-13: 40 ug/min via INTRAVENOUS

## 2023-03-13 MED ORDER — ROCURONIUM BROMIDE 100 MG/10ML IV SOLN
INTRAVENOUS | Status: DC | PRN
  Administered 2023-03-13: 30 mg via INTRAVENOUS
  Administered 2023-03-13: 50 mg via INTRAVENOUS

## 2023-03-13 MED ORDER — BISACODYL 10 MG RE SUPP
10.0000 mg | Freq: Every day | RECTAL | Status: DC | PRN
  Administered 2023-03-20: 10 mg via RECTAL
  Filled 2023-03-13: qty 1

## 2023-03-13 MED ORDER — INSULIN ASPART 100 UNIT/ML IJ SOLN
INTRAMUSCULAR | Status: AC
  Filled 2023-03-13: qty 1

## 2023-03-13 MED ORDER — TRANEXAMIC ACID-NACL 1000-0.7 MG/100ML-% IV SOLN
INTRAVENOUS | Status: AC
  Filled 2023-03-13: qty 100

## 2023-03-13 MED ORDER — PHENOL 1.4 % MT LIQD: 1.0000 | OROMUCOSAL | Status: DC | PRN

## 2023-03-13 MED ORDER — METOCLOPRAMIDE HCL 5 MG/ML IJ SOLN: 5.0000 mg | Freq: Three times a day (TID) | INTRAMUSCULAR | Status: DC | PRN

## 2023-03-13 MED ORDER — INSULIN ASPART 100 UNIT/ML IJ SOLN
8.0000 [IU] | Freq: Once | INTRAMUSCULAR | Status: AC
  Administered 2023-03-13: 8 [IU] via SUBCUTANEOUS

## 2023-03-13 MED ORDER — PHENYLEPHRINE 80 MCG/ML (10ML) SYRINGE FOR IV PUSH (FOR BLOOD PRESSURE SUPPORT)
PREFILLED_SYRINGE | INTRAVENOUS | Status: DC | PRN
  Administered 2023-03-13 (×2): 80 ug via INTRAVENOUS

## 2023-03-13 MED ORDER — TRANEXAMIC ACID-NACL 1000-0.7 MG/100ML-% IV SOLN
1000.0000 mg | Freq: Once | INTRAVENOUS | Status: AC
  Administered 2023-03-13: 1000 mg via INTRAVENOUS

## 2023-03-13 MED ORDER — BUPIVACAINE LIPOSOME 1.3 % IJ SUSP
INTRAMUSCULAR | Status: AC
  Filled 2023-03-13: qty 20

## 2023-03-13 SURGICAL SUPPLY — 86 items
ADH SKN CLS APL DERMABOND .7 (GAUZE/BANDAGES/DRESSINGS)
ANCH SUT .5 CRC TPR CT 40X40 (SUTURE) ×1
ANCH SUT 1.4 SUT TPE BLK/WHT (SUTURE) ×1
APL PRP STRL LF DISP 70% ISPRP (MISCELLANEOUS) ×1
BIT DRILL 2.5 (BIT) ×1
BIT DRILL 2.5X4.5XSCR (BIT) IMPLANT
BIT DRL 2.5X4.5XSCR (BIT) ×1
BLADE SAGITTAL WIDE XTHICK NO (BLADE) ×1 IMPLANT
BSPLAT GLND 10D RSS AETOS (Plate) ×1 IMPLANT
CHLORAPREP W/TINT 26 (MISCELLANEOUS) ×1 IMPLANT
CNTNR URN SCR LID CUP LEK RST (MISCELLANEOUS) IMPLANT
CONT SPEC 4OZ STRL OR WHT (MISCELLANEOUS)
COOLER POLAR GLACIER W/PUMP (MISCELLANEOUS) ×1 IMPLANT
DERMABOND ADVANCED .7 DNX12 (GAUZE/BANDAGES/DRESSINGS) IMPLANT
DRAPE INCISE IOBAN 66X45 STRL (DRAPES) ×2 IMPLANT
DRAPE SHEET LG 3/4 BI-LAMINATE (DRAPES) ×2 IMPLANT
DRAPE TABLE BACK 80X90 (DRAPES) ×1 IMPLANT
DRAPE U-SHAPE 47X51 STRL (DRAPES) ×1 IMPLANT
DRSG OPSITE POSTOP 3X4 (GAUZE/BANDAGES/DRESSINGS) IMPLANT
DRSG OPSITE POSTOP 4X6 (GAUZE/BANDAGES/DRESSINGS) IMPLANT
DRSG OPSITE POSTOP 4X8 (GAUZE/BANDAGES/DRESSINGS) IMPLANT
DRSG TEGADERM 2-3/8X2-3/4 SM (GAUZE/BANDAGES/DRESSINGS) IMPLANT
ELECT REM PT RETURN 9FT ADLT (ELECTROSURGICAL) ×1
ELECTRODE REM PT RTRN 9FT ADLT (ELECTROSURGICAL) ×1 IMPLANT
GAUZE SPONGE 2X2 STRL 8-PLY (GAUZE/BANDAGES/DRESSINGS) IMPLANT
GAUZE XEROFORM 1X8 LF (GAUZE/BANDAGES/DRESSINGS) IMPLANT
GLENOID BSPLAT 10D RSS AETOS (Plate) IMPLANT
GLENOID GUIDE PIN 2.5 NT (PIN) ×1
GLENOSPHERE CON AETOS 38 RSS (Shoulder) IMPLANT
GLOVE BIOGEL PI IND STRL 8 (GLOVE) ×2 IMPLANT
GLOVE PI ULTRA LF STRL 7.5 (GLOVE) ×2 IMPLANT
GLOVE SURG ORTHO 8.0 STRL STRW (GLOVE) ×2 IMPLANT
GLOVE SURG SYN 8.0 (GLOVE) ×1 IMPLANT
GLOVE SURG SYN 8.0 PF PI (GLOVE) ×1 IMPLANT
GOWN STRL REUS W/ TWL LRG LVL3 (GOWN DISPOSABLE) ×2 IMPLANT
GOWN STRL REUS W/ TWL XL LVL3 (GOWN DISPOSABLE) ×1 IMPLANT
GOWN STRL REUS W/TWL LRG LVL3 (GOWN DISPOSABLE) ×2
GOWN STRL REUS W/TWL XL LVL3 (GOWN DISPOSABLE) ×1
GUIDE PIN GLENOID 2.5 NT (PIN) ×1
Glenoid Guide Pin 2.5mm Nonthreaded IMPLANT
HEMOVAC 400CC 10FR (MISCELLANEOUS) IMPLANT
HOOD PEEL AWAY T7 (MISCELLANEOUS) ×3 IMPLANT
LINER STD +3S RSS HXL (Liner) IMPLANT
MANIFOLD NEPTUNE II (INSTRUMENTS) ×1 IMPLANT
MASK FACE SPIDER DISP (MASK) ×1 IMPLANT
MAT ABSORB FLUID 56X50 GRAY (MISCELLANEOUS) ×2 IMPLANT
NDL HYPO 21X1.5 SAFETY (NEEDLE) IMPLANT
NDL REVERSE CUT 1/2 CRC (NEEDLE) IMPLANT
NDL SPNL 20GX3.5 QUINCKE YW (NEEDLE) IMPLANT
NEEDLE HYPO 21X1.5 SAFETY (NEEDLE) ×1 IMPLANT
NEEDLE REVERSE CUT 1/2 CRC (NEEDLE) ×1 IMPLANT
NEEDLE SPNL 20GX3.5 QUINCKE YW (NEEDLE) ×1 IMPLANT
NS IRRIG 1000ML POUR BTL (IV SOLUTION) ×1 IMPLANT
PACK ARTHROSCOPY SHOULDER (MISCELLANEOUS) ×1 IMPLANT
PAD WRAPON POLAR SHDR XLG (MISCELLANEOUS) ×1 IMPLANT
PIN GUIDE GLENOID 2.5 NT (PIN) IMPLANT
PULSAVAC PLUS IRRIG FAN TIP (DISPOSABLE) ×1
SCREW BODY REVERSE STD (Screw) IMPLANT
SCREW CENTER 4.5X25 RSS (Screw) IMPLANT
SCREW LOCKING 4.5X25 RSS (Screw) IMPLANT
SCREW LOCKING 4.5X30 RSS (Screw) IMPLANT
SLING ULTRA II LG (MISCELLANEOUS) IMPLANT
SLING ULTRA II M (MISCELLANEOUS) IMPLANT
SPONGE T-LAP 18X18 ~~LOC~~+RFID (SPONGE) ×1 IMPLANT
STAPLER SKIN PROX 35W (STAPLE) IMPLANT
STEM PRESS FIT SZ 09 TSS (Stem) IMPLANT
STRAP SAFETY 5IN WIDE (MISCELLANEOUS) ×1 IMPLANT
SUT ETHIBOND 5-0 MS/4 CCS GRN (SUTURE) ×1
SUT FIBERWIRE #2 38 BLUE 1/2 (SUTURE) ×1
SUT MNCRL AB 4-0 PS2 18 (SUTURE) IMPLANT
SUT PROLENE 6 0 P 1 18 (SUTURE) IMPLANT
SUT TICRON 2-0 30IN 311381 (SUTURE) ×2 IMPLANT
SUT VIC AB 0 CT1 36 (SUTURE) ×1 IMPLANT
SUT VIC AB 2-0 CT2 27 (SUTURE) ×2 IMPLANT
SUT XBRAID 1.4 BLK/WHT (SUTURE) IMPLANT
SUT XBRAID 1.4 BLUE (SUTURE) IMPLANT
SUT XBRAID 1.4 WHITE/BLUE (SUTURE) IMPLANT
SUT XBRAID 2 BLACK/BLUE (SUTURE) IMPLANT
SUTURE ETHBND 5-0 MS/4 CCS GRN (SUTURE) IMPLANT
SUTURE FIBERWR #2 38 BLUE 1/2 (SUTURE) ×1 IMPLANT
SYR 20ML LL LF (SYRINGE) ×1 IMPLANT
SYR 30ML LL (SYRINGE) IMPLANT
TIP FAN IRRIG PULSAVAC PLUS (DISPOSABLE) ×1 IMPLANT
TRAP FLUID SMOKE EVACUATOR (MISCELLANEOUS) ×1 IMPLANT
WATER STERILE IRR 500ML POUR (IV SOLUTION) ×1 IMPLANT
WRAPON POLAR PAD SHDR XLG (MISCELLANEOUS) ×1

## 2023-03-13 NOTE — Op Note (Addendum)
Operative Note    SURGERY DATE: 03/13/2023   PRE-OP DIAGNOSIS:  1. Left 4-part proximal humerus fracture/dislocation   POST-OP DIAGNOSIS:  1. Left 4-part proximal humerus fracture/dislocation   PROCEDURES:  1. Left reverse total shoulder arthroplasty 2. Left biceps tenodesis   SURGEON: Rosealee Albee, MD  ASSISTANTS: Dedra Skeens, PA   ANESTHESIA: Gen   ESTIMATED BLOOD LOSS: 350cc   TOTAL IV FLUIDS: see anesthesia record  IMPLANTS: S&N [employer] 10 deg Augmented baseplate w/central + 3 peripheral 4.60mm screws, 38mm concentric glenosphere; Titan Reverse Body Standard and Body Screw; TSS Press Fit Size 9 stem; +3 Standard Poly Liner  INDICATION(S):  Philip Robbins is 82 y.o. male who sustained a Right 3-part minimally displaced proximal humerus fracture and Left 4-part proximal humerus fracture w/posterior dislocation of humeral head.  This occurred after the patient had an episode of altered mental status, sepsis, UTI, and electrolyte imbalance.  He was also found to have an aspiration pneumonia.  Etiology of bilateral proximal humerus fracture is thought to be due to patient being combative with EMS while mental status was altered.  Patient was admitted to the hospital on 03/08/2023 and has been treated appropriately since that time.  He was deemed medically clear for surgery by primary medical team. after discussion of risks, benefits, and alternatives to surgery, the patient elected to proceed with reverse shoulder arthroplasty and biceps tenodesis.   OPERATIVE FINDINGS:  - Left: 4-part proximal humerus fracture with posterior dislocation of humeral head, biceps tendinopathy - Right: minimally displaced 3 part proximal humerus fracture   OPERATIVE REPORT:   I identified Kenlee A Koppen in the pre-operative holding area. Informed consent was obtained and the surgical site was marked. I reviewed the risks and benefits of the proposed surgical intervention and the patient (and/or patient's  guardian) wished to proceed. The patient was transferred to the operative suite and general anesthesia was administered. The patient was placed in the beach chair position with the head of the bed elevated approximately 30 degrees. All down side pressure points were appropriately padded. Appropriate IV antibiotics were administered. The extremity was then prepped and draped in standard fashion. A time out was performed confirming the correct extremity, correct patient, and correct procedure.   We used the standard deltopectoral incision from the coracoid to ~12cm distal.  Vancomycin powder was placed in the dermis.  We found the cephalic vein and took it laterally. We opened the deltopectoral interval widely and placed retractors under the CA ligament in the subacromial space and under the deltoid tendon at its insertion. We then abducted and internally rotated the arm and released the underlying bursa between these retractors, taking care not to damage the circumflex branch of the axillary nerve.   Next, we brought the arm back in adduction at slight forward flexion with external rotation. We opened the clavipectoral fascia lateral to the conjoint tendon.  The arm was then internally rotated, we cut the falciform ligament at approximately 1 cm of the upper portion of the pectoralis major insertion. Next we unroofed the bicipital groove. Biceps tendon was significantly erythematous and inflamed. We proceeded with a soft tissue biceps tenodesis given the pathology of the tendon.  After opening the biceps tendon sheath all the way to the supraglenoid tubercle, we performed a biceps tenodesis with two #2 TiCron sutures to the upper border of the pectoralis major. The proximal portion of the tendon was excised.   FiberWire sutures were placed within the supraspinatus, infraspinatus, and subscapularis  to obtain appropriate control of the tuberosities. The articular humeral head portion that was posteriorly  dislocated was removed.  The supraspinatus tendon itself was excised.  The infraspinatus was noted to be attached to the greater tuberosity fragment.  Cancellous bone was removed from the removed humeral head to be used later as bone graft.  #5 Ethibond sutures were placed around the greater tuberosity at the bone-tendon junction.  Additionally, two  X-Braid suture were placed around the greater tuberosity (one to serve as tuberosity to fin sutures and one to serve as an around the world suture).   A posterior retractor was used to retract greater tuberosity and the humeral shaft, exposing the glenoid.  The anterior capsule was separated from the subscapularis and excised from the lesser tuberosity to the glenoid.  The labrum and the remnant of the biceps tendon were removed in a circumferential fashion.  The attachment of the triceps muscle was released off of the inferior glenoid.  During the glenoid exposure, the axillary nerve was protected the entire time.  Appropriate retractors were placed and there was excellent glenoid visualization.  The central guidepin was drilled with a 10 degree inferior tilt guide.  On preoperative imaging, the patient had approximately relatively neutral glenoid version.  This was not changed.  The glenoid was reamed in order to remove the cartilaginous surface without taking away too much bone.  The hole for the central post was drilled. The baseplate was impacted in a press-fit fashion. The central screw was drilled and placed.  Locking peripheral screws were drilled, measured, and placed.  Peripheral reamer was used to clear out any debris and allow for appropriate glenosphere placement.  Glenosphere was impacted and found to be firmly seated.   We then turned our attention to the humerus.  The humeral canal was sounded and sized appropriately until excellent diaphyseal fit was achieved.  Broaching was performed at ~20 degrees of retroversion.  Standard body with various  sizes of the poly was trialed.  The joint was reduced and noted to have satisfactory stability, motion, and deltoid tension with adequate tuberosity reduction.  Trial implants were removed.  We drilled two holes into the shaft on either side of the bicipital groove ~2cm inferior to the humeral fracture line and passed two Xbraid tapes through both holes to serve as vertical tuberosity fixation sutures.  The previously passed Xbraid sutures in the greater tuberosity were passed through the lateral suture holes in the stem.  The stem was then inserted into the appropriate position.  Excellent press-fit was achieved.  Poly was trialed and above noted poly size was placed.   Next, #5 Ethibond sutures from the greater tuberosity were passed around the lesser tuberosity to serve as horizontal cerclage sutures.  One of the Xbraid tapes previously passed through the implant was then passed around the lesser tuberosity as well to serve as an around the world suture (greater tuberosity to stem to the lesser tuberosity).  Both vertical sutures were passed through the greater and lesser tuberosities.  Bone graft was placed between the tuberosities and the implant to help stimulate bone healing.  The humerus was externally rotated. The greater tuberosity to implant XBraid suture was tied first, securing the greater tuberosity to the implant.  Next, the horizontal cerclage Ethibond sutures were tied, and this was followed by the around the world XBraid suture. Finally, vertical sutures were tied. This achieved excellent stability of the tuberosities and they were stable to external and internal rotation.  Final confirmation of motion, tension, and stability were satisfactory. The wound was thoroughly irrigated. Vancomycin powder was placed.  A Hemovac drain was placed.    The deltopectoral interval was closed with a running, 0-Vicryl suture. The skin was closed with 2-0 Vicryl and staples. Xeroform and Honeycomb dressing  were applied. A PolarCare unit and sling were placed. Patient was extubated, transferred to a stretcher bed and to the post anesthesia care unit in stable condition.    Of note, assistance from a PA was essential to performing the surgery. PA assisted with patient positioning, exposure, retraction, instrumentation, and wound closure. The surgery would have been more difficult and had longer operative time without PA assistance.   Additionally, this case had significantly increased complexity and surgical time compared to standard reverse shoulder arthroplasty.  This was due to this being a 4 part proximal humerus fracture.  This caused significantly distorted anatomy which required more careful and meticulous dissection, adding to surgical time.  Additionally, lesser and greater tuberosity repair required multiple sutures to be placed and tied.  Both of these factors added approximately 30 minutes to the surgical time compared to that for a standard reverse shoulder arthroplasty.   POSTOPERATIVE PLAN: Operative arm to remain in sling and abduction pillow with arm at neutral at all times except RoM exercises and hygiene. Can perform pendulums, elbow/wrist/hand RoM exercises. Passive RoM allowed to 90 FF and 30 ER. ASA 325mg  x 6 weeks for DVT ppx. Plan for outpatient PT starting on POD #3-4. Patient to return to clinic in ~2 weeks for post-operative appointment.    ADDENDUM: Postoperative radiographs were obtained of bilateral shoulders in PACU. R shoulder was suggestive of a posterior dislocation. CT scan was obtained, and this confirmed a proximal humerus fracture with posterior glenohumeral dislocation. There is also a very large reverse Hill-Sachs lesion. Given the size of this lesion, closed reduction is likely to be unstable and will lead to re-dislocation. Plan will be for R reverse shoulder replacement.

## 2023-03-13 NOTE — Anesthesia Procedure Notes (Signed)
Procedure Name: Intubation Date/Time: 03/13/2023 11:04 AM  Performed by: Lanell Matar, CRNAPre-anesthesia Checklist: Patient identified, Emergency Drugs available, Suction available and Patient being monitored Patient Re-evaluated:Patient Re-evaluated prior to induction Oxygen Delivery Method: Circle System Utilized Preoxygenation: Pre-oxygenation with 100% oxygen Induction Type: IV induction Ventilation: Mask ventilation without difficulty Laryngoscope Size: McGraph and 4 Grade View: Grade I Tube type: Oral Tube size: 7.5 mm Number of attempts: 1 Airway Equipment and Method: Stylet and Oral airway Placement Confirmation: ETT inserted through vocal cords under direct vision, positive ETCO2 and breath sounds checked- equal and bilateral Secured at: 22 cm Tube secured with: Tape Dental Injury: Teeth and Oropharynx as per pre-operative assessment

## 2023-03-13 NOTE — Plan of Care (Signed)
  Problem: Activity: Goal: Ability to tolerate increased activity will improve Outcome: Progressing   Problem: Clinical Measurements: Goal: Ability to maintain a body temperature in the normal range will improve Outcome: Progressing   Problem: Respiratory: Goal: Ability to maintain adequate ventilation will improve Outcome: Progressing Goal: Ability to maintain a clear airway will improve Outcome: Progressing   Problem: Education: Goal: Ability to describe self-care measures that may prevent or decrease complications (Diabetes Survival Skills Education) will improve Outcome: Progressing Goal: Individualized Educational Video(s) Outcome: Progressing   Problem: Coping: Goal: Ability to adjust to condition or change in health will improve Outcome: Progressing   Problem: Fluid Volume: Goal: Ability to maintain a balanced intake and output will improve Outcome: Progressing   Problem: Health Behavior/Discharge Planning: Goal: Ability to identify and utilize available resources and services will improve Outcome: Progressing Goal: Ability to manage health-related needs will improve Outcome: Progressing   Problem: Metabolic: Goal: Ability to maintain appropriate glucose levels will improve Outcome: Progressing   Problem: Nutritional: Goal: Maintenance of adequate nutrition will improve Outcome: Progressing Goal: Progress toward achieving an optimal weight will improve Outcome: Progressing   Problem: Skin Integrity: Goal: Risk for impaired skin integrity will decrease Outcome: Progressing   Problem: Tissue Perfusion: Goal: Adequacy of tissue perfusion will improve Outcome: Progressing   Problem: Education: Goal: Knowledge of General Education information will improve Description: Including pain rating scale, medication(s)/side effects and non-pharmacologic comfort measures Outcome: Progressing   Problem: Health Behavior/Discharge Planning: Goal: Ability to manage  health-related needs will improve Outcome: Progressing   Problem: Clinical Measurements: Goal: Ability to maintain clinical measurements within normal limits will improve Outcome: Progressing Goal: Will remain free from infection Outcome: Progressing Goal: Diagnostic test results will improve Outcome: Progressing Goal: Respiratory complications will improve Outcome: Progressing Goal: Cardiovascular complication will be avoided Outcome: Progressing   Problem: Activity: Goal: Risk for activity intolerance will decrease Outcome: Progressing   Problem: Nutrition: Goal: Adequate nutrition will be maintained Outcome: Progressing   Problem: Coping: Goal: Level of anxiety will decrease Outcome: Progressing   Problem: Elimination: Goal: Will not experience complications related to bowel motility Outcome: Progressing Goal: Will not experience complications related to urinary retention Outcome: Progressing   Problem: Pain Managment: Goal: General experience of comfort will improve Outcome: Progressing   Problem: Safety: Goal: Ability to remain free from injury will improve Outcome: Progressing   Problem: Skin Integrity: Goal: Risk for impaired skin integrity will decrease Outcome: Progressing   Problem: Education: Goal: Knowledge of the prescribed therapeutic regimen will improve Outcome: Progressing Goal: Understanding of activity limitations/precautions following surgery will improve Outcome: Progressing Goal: Individualized Educational Video(s) Outcome: Progressing   Problem: Activity: Goal: Ability to tolerate increased activity will improve Outcome: Progressing   Problem: Pain Management: Goal: Pain level will decrease with appropriate interventions Outcome: Progressing

## 2023-03-13 NOTE — Anesthesia Preprocedure Evaluation (Addendum)
Anesthesia Evaluation  Patient identified by MRN, date of birth, ID band Patient awake    Reviewed: Allergy & Precautions, NPO status , Patient's Chart, lab work & pertinent test results  Airway Mallampati: IV  TM Distance: >3 FB Neck ROM: full    Dental  (+) Chipped, Dental Advidsory Given, Poor Dentition   Pulmonary shortness of breath and with exertion, pneumonia, unresolved   Pulmonary exam normal        Cardiovascular hypertension, On Medications Normal cardiovascular exam     Neuro/Psych negative neurological ROS  negative psych ROS   GI/Hepatic Neg liver ROS,GERD  ,,  Endo/Other  diabetesHypothyroidism    Renal/GU      Musculoskeletal   Abdominal   Peds  Hematology negative hematology ROS (+)   Anesthesia Other Findings Patient with a PMH of sepsis that has resolved. Patient with no chronic respiratory problems but is currently being treated for a possible aspiration pneumonia. Patient's saturation on room air is in the low 90s in bed. Discussed the risk of a interscalene block with him and determined that the risks outweigh the benefits and we will not proceed with a regional block. Discussed the case with the surgeon and CRNA.   Past Medical History: No date: Diabetes type 2, controlled (HCC) No date: Hypothyroid No date: UTI (urinary tract infection)  Past Surgical History: No date: CHOLECYSTECTOMY  BMI    Body Mass Index: 34.26 kg/m      Reproductive/Obstetrics negative OB ROS                             Anesthesia Physical Anesthesia Plan  ASA: 3  Anesthesia Plan: General ETT and General   Post-op Pain Management: Dilaudid IV   Induction: Intravenous  PONV Risk Score and Plan: 3 and Ondansetron, Dexamethasone and Midazolam  Airway Management Planned: Oral ETT  Additional Equipment:   Intra-op Plan:   Post-operative Plan: Extubation in OR  Informed  Consent: I have reviewed the patients History and Physical, chart, labs and discussed the procedure including the risks, benefits and alternatives for the proposed anesthesia with the patient or authorized representative who has indicated his/her understanding and acceptance.     Dental Advisory Given  Plan Discussed with: Anesthesiologist, CRNA and Surgeon  Anesthesia Plan Comments: (Patient consented for risks of anesthesia including but not limited to:  - adverse reactions to medications - damage to eyes, teeth, lips or other oral mucosa - nerve damage due to positioning  - sore throat or hoarseness - Damage to heart, brain, nerves, lungs, other parts of body or loss of life  Patient voiced understanding.)       Anesthesia Quick Evaluation

## 2023-03-13 NOTE — H&P (Signed)
H&P reviewed. No significant changes noted.  

## 2023-03-13 NOTE — Transfer of Care (Signed)
Immediate Anesthesia Transfer of Care Note  Patient: Chana Bode  Procedure(s) Performed: REVERSE SHOULDER ARTHROPLASTY (Left: Shoulder)  Patient Location: PACU  Anesthesia Type:General  Level of Consciousness: awake, drowsy, and patient cooperative  Airway & Oxygen Therapy: Patient Spontanous Breathing and Patient connected to face mask oxygen  Post-op Assessment: Report given to RN, Post -op Vital signs reviewed and stable, and Patient moving all extremities X 4  Post vital signs: Reviewed and stable  Last Vitals:  Vitals Value Taken Time  BP 136/52 03/13/23 1416  Temp    Pulse 115 03/13/23 1422  Resp 25 03/13/23 1422  SpO2 95 % 03/13/23 1422  Vitals shown include unfiled device data.  Last Pain:  Vitals:   03/13/23 0913  TempSrc: Temporal  PainSc: 0-No pain      Patients Stated Pain Goal: 0 (03/12/23 2200)  Complications: No notable events documented.

## 2023-03-13 NOTE — Progress Notes (Signed)
[facility] Progress Note  Patient: Philip Robbins    MVH:[id number]  DOA: 03/08/2023     Date of Service: the patient was seen and examined on 03/13/2023  Chief Complaint  Patient presents with   Fever   Brief hospital course:  Philip Robbins is a 82 y.o. male with medical history significant of CLL under surveillance, BPH, HTN, IIDM, hypothyroidism, HLD, chronic leukocytosis presented with altered mentations hypoxia.   His symptoms started last Friday, patient went to have a left neck lymph node biopsy, after the procedure patient developed URI-like symptoms with runny nose sore throat, yesterday, patient developed strong burning sensation in the urine and dark-colored urine and went to see urgent care was diagnosed with UTI and started on antibiotics.  Patient continued to feel malaise generalized weakness, nauseous but no vomiting and no abdominal pain overnight and this morning patient was found in the bed unresponsive with large vomitus on his bed and foams in his mouth.  Family called EMS, EMS arrived and found patient hypoxic O2 saturation 95% on room air and patient was placed on 15 L NRB and sent to ED.  Fever 101.8.  Wife also witnessed patient had some twitching movement of his arms and suspect whether he had seizure.  At baseline patient has BPH appears to be poorly controlled, he reported Brueggen screams and frequent nocturnal urination but no history of UTI before.   ED Course: Fever 101.2, tachycardia hypotensive oxygen saturation 91% on 6 L.  Chest x-ray showed bilateral infiltrates indicating for pneumonia.  WBC 35.1.  Hemoglobin 12, sodium 118, K4.1, bicarb 12, creatinine 1.1, troponin 64> 99, AST 67, ALT 46   Patient was given 3 L IV bolus and started on ceftriaxone, azithromycin Assessment and Plan:  Sepsis, severe, Resolved -Evidenced by new onset of hypoxia, elevated temperature and elevated lactic acid with signs of endorgan damage of acute encephalopathy and source  of infection likely aspiration pneumonia and UTI. s/p IVF given as per protocol.  -Continue ceftriaxone and azithromycin to cover pneumonia and UTI for 5 days   Acute hypoxic respiratory failure secondary to pneumonia and COPD/asthma exacerbation  Patient does not have diagnosis of COPD or asthma Patient was wheezing, resolved on 8/28 Continue above antibiotics for pneumonia 8/27  prednisone 40 mg p.o. daily for 3 days Started Breo Ellipta inhaler and DuoNeb TID 8/28 respiratory failure resolved, patient saturating well on room air. 8/29 dyspnea on exertion, hypoxia on ambulation, continue supplemental O2 inhalation as needed 8/30 started Lasix 40 mg IV twice daily for 2 days   Aspiration pneumonia -Likely secondary to repeated vomiting at night, management as above  UTI -Likely secondary to uncontrolled BPH, continue antibiotics -Check PVR, outpatient follow-up with urology for urodynamic study -Continue Flomax  acute hypotonic hyponatremia -Hypovolemic, likely secondary to acute GI loss from repeated vomiting overnight and sepsis Serum osmolality 257 low, TSH 2.6 within normal range Nephro consulted, s/p saline 3% IVF discontinued on 8/27 Na 118------134 Sodium level gradually improving, continue to monitor. Continue fluid restriction 1.5 L/day   # Hypophosphatemia, Phos repleted.  Resolved # Hypomagnesemia, mag repleted.  Resolved Monitor electrolytes daily.  And replete as needed  # B/L Humral fracture No h/o fall, possible trauma while transportation as pt was combative and he was held strongly by EMS.  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  Repeat sodium level and likely will start hypertonic saline, consult nephrology. -Seizure precaution.   Troponin elevation -No chest pains, EKG showed no acute ST changes and the poor R wave progression appears to be chronic. -Etiology likely related to sepsis and acute hypoxia and demanding ischemia.  Treat sepsis, repeat troponin this evening.   CLL with chronic leukocytosis -Peripheral smear is being reviewed to rule out acute transformation -Management of sepsis as above 8/29 WBC count is significantly elevated, could be due to steroids.  Monitor CBC daily  HTN Blood pressure improved 8/27 resumed lisinopril 20 mg p.o. 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 weekly, follow with PCP to repeat vitamin D level after 3 to 6 months.  Vitamin B12 deficiency: Started vitamin B12 1000 mcg oral supplement, avoided IM injection due to bilateral humeral fractures Follow-up PCP to repeat vitamin B12 level after 3 to 6 months.  Iron deficiency, transferrin saturation 6%, started oral iron supplement.  Avoided IV iron due to active infection. Follow-up with PCP to repeat iron profile after 3 to 6 months Folic acid level 8.3, at lower end, started oral supplement for 3 months.   Body mass index is 34.26 kg/m.  Interventions:  Diet: Heart healthy diet DVT Prophylaxis: Subcutaneous Lovenox   Advance goals of care discussion: Full code  Family Communication: family was present at bedside, at the time of interview.  The pt provided permission to discuss medical plan with the family. Opportunity was given to ask question and all questions were  answered satisfactorily.   Disposition:  Pt is from Home, admitted with hyponatremia, pneumonia, respiratory failure, UTI, b/l.  Humerus fracture, still has resp failure and low sodium, which precludes a safe discharge. Discharge to SNF , when medically stable.  Subjective: No significant overnight events.  Patient was seen after surgery, tolerated procedure well, requesting Tylenol for pain control, no any other active issues.  Still has some shortness of breath on 2 L oxygen via nasal cannula.  Denied any worsening of symptoms.   Physical Exam: General: NAD, lying comfortably Appear in no distress, affect appropriate Eyes: PERRLA ENT: Oral Mucosa Clear, moist  Neck: no JVD,  Cardiovascular: S1 and S2 Present, no Murmur,  Respiratory: Good air entry bilaterally, b/l  crackles and mild wheezing. Abdomen: Bowel Sound present, Soft and no tenderness,  Skin: no rashes Extremities: no Pedal edema, no calf tenderness.  Bilateral shoulders slings intact. S/p Left shoulder surgery. Neurologic: without any new focal findings Gait not checked due to patient safety concerns  Vitals:   03/13/23 1445 03/13/23 1500 03/13/23 1515 03/13/23 1529  BP: (!) 153/58 (!) 161/60 (!) 153/54   Pulse: (!) 112 (!) 106 (!) 103 99  Resp:  20 (!) 24 19  Temp:      TempSrc:      SpO2: (!) 85% 93% 93% 94%  Weight:      Height:        Intake/Output Summary (Last 24 hours) at 03/13/2023 1646 Last data filed at 03/13/2023 1358 Gross per 24 hour  Intake 1100 ml  Output 1350 ml  Net -250 ml   Filed Weights   03/10/23 0400 03/12/23 0500 03/13/23 0913  Weight: 103.3 kg 107 kg 100.7 kg    Data Reviewed: I have personally reviewed and interpreted daily labs, tele strips, imagings as discussed above. I reviewed all nursing notes, pharmacy notes, vitals, pertinent old records I have discussed plan of care as described above with RN and patient/family.  CBC: Recent Labs  Lab 03/08/23 0620 03/09/23 0436  03/10/23 0430 03/11/23 0418 03/12/23 0450 03/13/23 0342  WBC 35.1* 34.7* 29.7* 31.5* 52.3* 54.1*  NEUTROABS 19.8*  --   --   --   --   --   HGB 12.1* 9.9* 10.5* 9.7* 9.5* 9.6*  HCT 36.5* 29.7* 31.5* 28.8* 28.2* 28.8*  MCV 90.1 87.6 88.5 87.3 87.9 87.5  PLT 218 200 243 267 317 363   Basic Metabolic Panel: Recent Labs  Lab 03/09/23 0436 03/09/23 0927 03/10/23 0430 03/10/23 0918 03/11/23 0418 03/12/23 0450 03/13/23 0342  NA 123*   < > 132* 132* 133* 131* 134*  K 4.2  --  4.0  --  4.2 3.8 4.3  CL 96*  --  103  --  104 98 96*  CO2 22  --  22  --  23 24 24   GLUCOSE 148*  --  148*  --  203* 195* 269*  BUN 13  --  11  --  12 17 22   CREATININE 0.79  --  0.75  --  0.63 0.72 0.79  CALCIUM 8.2*  --  8.4*  --  8.4* 8.5* 9.2  MG 1.7  --  1.5*  --  1.9 1.8 1.8  PHOS 2.2*  --  1.8*  --  3.3 3.0 3.4   < > = values in this interval not displayed.    Studies: No results found.  Scheduled Meds:  acetaminophen  1,000 mg Oral Q8H   vitamin C  500 mg Oral Daily   azithromycin  500 mg Oral Daily   Chlorhexidine Gluconate Cloth  6 each Topical Q0600   vitamin B-12  1,000 mcg Oral Daily   diclofenac Sodium  2 g Topical QID   docusate sodium  100 mg Oral BID   [START ON 03/14/2023] enoxaparin (LOVENOX) injection  40 mg Subcutaneous Q24H   fluticasone furoate-vilanterol  1 puff Inhalation Daily   folic acid  1 mg Oral Daily   guaiFENesin  600 mg Oral BID   insulin aspart  0-15 Units Subcutaneous TID WC   insulin aspart  0-5 Units Subcutaneous QHS   insulin aspart  4 Units Subcutaneous TID WC   ipratropium-albuterol  3 mL Nebulization BID   iron polysaccharides  150 mg Oral Daily   levothyroxine  50 mcg Oral Q0600   lisinopril  20 mg Oral Daily   multivitamin with minerals  1 tablet Oral Daily   pantoprazole  40 mg Oral BID   Followed by   Melene Muller ON 03/15/2023] pantoprazole  40 mg Oral Daily   Ensure Max Protein  11 oz Oral BID   tamsulosin  0.4 mg Oral QPC supper   Vitamin D  (Ergocalciferol)  50,000 Units Oral Q7 days   Continuous Infusions:  sodium chloride     tranexamic acid      PRN Meds: alum & mag hydroxide-simeth, bisacodyl, chlorpheniramine-HYDROcodone, hydrALAZINE, HYDROmorphone (DILAUDID) injection, menthol-cetylpyridinium **OR** phenol, metoCLOPramide **OR** metoCLOPramide (REGLAN) injection, ondansetron **OR** ondansetron (ZOFRAN) IV, oxyCODONE, oxyCODONE, senna-docusate  Time spent: 55 minutes  Author: Lelon Frohlich  Lucianne Muss MD [facility] 03/13/2023 4:46 PM  To reach On-call, see care teams to locate the attending and reach out to them via [email or web address]. If 7PM-7AM, please contact night-coverage If you still have difficulty reaching the attending provider, please page the [facility] (Director on Call) for [facility] on amion for assistance.

## 2023-03-13 NOTE — Consult Note (Signed)
PHARMACY CONSULT NOTE - ELECTROLYTES  Pharmacy Consult for Electrolyte Monitoring and Replacement   Recent Labs: Potassium (mmol/L)  Date Value  03/13/2023 4.3   Magnesium (mg/dL)  Date Value  02/72/5366 1.8   Calcium (mg/dL)  Date Value  44/09/4740 9.2   Albumin (g/dL)  Date Value  59/56/3875 2.7 (L)   Phosphorus (mg/dL)  Date Value  64/33/2951 3.4   Sodium (mmol/L)  Date Value  03/13/2023 134 (L)   Height: 5\' 10"  (177.8 cm) Weight: 107 kg (235 lb 14.3 oz) IBW/kg (Calculated) : 73 Estimated Creatinine Clearance: 90.2 mL/min (by C-G formula based on SCr of 0.79 mg/dL).  Assessment  Philip Robbins is a 82 y.o. male presenting with pneumonia and hyponatremia. PMH significant for CLL, BPH, HTN, DM, hypothyroidism, HLD. Pharmacy has been consulted to monitor and replace electrolytes.  Diet: Heart healthy MIVF: N/A Pertinent medications: N/A  Goal of Therapy: Electrolytes within normal limits  Plan:  Mag low at 1.8, will order Mag sulfate 2 g IV x 1 dose Follow-up electrolytes with AM labs tomorrow  Thank you for allowing pharmacy to be a part of this patient's care.  Merryl Hacker, PharmD Clinical Pharmacist 03/13/2023 7:27 AM

## 2023-03-13 NOTE — Plan of Care (Signed)

## 2023-03-13 NOTE — Progress Notes (Addendum)
Postoperative radiographs were obtained of bilateral shoulders in PACU. R shoulder XR was suggestive of a posterior dislocation in addition to previously demonstrated minimally displaced greater tuberosity fracture. CT scan was obtained, and this confirmed a proximal humerus fracture with posterior glenohumeral dislocation. There is also a very large reverse Hill-Sachs lesion. Given the size of this lesion, closed reduction is likely to be unstable and will lead to re-dislocation.   Discussed that given he had bilateral posterior glenohumeral dislocations, seizure was a likely etiology.   Bilat UE: +ain/pin/u motor. Able to fire deltoid bilaterally SILT r/u/m/ax +rad pulse Slings in place LUE incision c/d/i   Plan will be for R reverse shoulder replacement on 03/17/23. After discussion of risks, benefits, and alternatives to surgery, the patient and his family elected to proceed.   2.   Please make NPO after midnight on 03/16/23.  3.   Hold Lovenox for 24 hrs in advance of surgery.

## 2023-03-14 DIAGNOSIS — J189 Pneumonia, unspecified organism: Secondary | ICD-10-CM | POA: Diagnosis not present

## 2023-03-14 LAB — MAGNESIUM: Magnesium: 2.1 mg/dL (ref 1.7–2.4)

## 2023-03-14 LAB — BASIC METABOLIC PANEL
Anion gap: 9 (ref 5–15)
BUN: 16 mg/dL (ref 8–23)
CO2: 23 mmol/L (ref 22–32)
Calcium: 7.8 mg/dL — ABNORMAL LOW (ref 8.9–10.3)
Chloride: 99 mmol/L (ref 98–111)
Creatinine, Ser: 0.64 mg/dL (ref 0.61–1.24)
GFR, Estimated: >60 mL/min (ref >60)
Glucose, Bld: 177 mg/dL — ABNORMAL HIGH (ref 70–99)
Potassium: 4.7 mmol/L (ref 3.5–5.1)
Sodium: 131 mmol/L — ABNORMAL LOW (ref 135–145)

## 2023-03-14 LAB — HEPATIC FUNCTION PANEL
ALT: 98 U/L — ABNORMAL HIGH (ref 0–44)
AST: 46 U/L — ABNORMAL HIGH (ref 15–41)
Albumin: 2.8 g/dL — ABNORMAL LOW (ref 3.5–5.0)
Alkaline Phosphatase: 51 U/L (ref 38–126)
Bilirubin, Direct: 0.2 mg/dL (ref 0.0–0.2)
Indirect Bilirubin: 1 mg/dL — ABNORMAL HIGH (ref 0.3–0.9)
Total Bilirubin: 1.2 mg/dL (ref 0.3–1.2)
Total Protein: 5.7 g/dL — ABNORMAL LOW (ref 6.5–8.1)

## 2023-03-14 LAB — CBC
HCT: 29.9 % — ABNORMAL LOW (ref 39.0–52.0)
Hemoglobin: 9.7 g/dL — ABNORMAL LOW (ref 13.0–17.0)
MCH: 29.5 pg (ref 26.0–34.0)
MCHC: 32.4 g/dL (ref 30.0–36.0)
MCV: 90.9 fL (ref 80.0–100.0)
Platelets: 408 10*3/uL — ABNORMAL HIGH (ref 150–400)
RBC: 3.29 MIL/uL — ABNORMAL LOW (ref 4.22–5.81)
RDW: 15.4 % (ref 11.5–15.5)
WBC: 60.5 10*3/uL (ref 4.0–10.5)
nRBC: 0 % (ref 0.0–0.2)

## 2023-03-14 LAB — GLUCOSE, CAPILLARY
Glucose-Capillary: 148 mg/dL — ABNORMAL HIGH (ref 70–99)
Glucose-Capillary: 266 mg/dL — ABNORMAL HIGH (ref 70–99)
Glucose-Capillary: 187 mg/dL — ABNORMAL HIGH (ref 70–99)
Glucose-Capillary: 247 mg/dL — ABNORMAL HIGH (ref 70–99)

## 2023-03-14 LAB — PHOSPHORUS: Phosphorus: 3.2 mg/dL (ref 2.5–4.6)

## 2023-03-14 MED ORDER — IPRATROPIUM-ALBUTEROL 0.5-2.5 (3) MG/3ML IN SOLN: 3.0000 mL | RESPIRATORY_TRACT | Status: DC | PRN

## 2023-03-14 NOTE — Progress Notes (Signed)
[facility] Progress Note  Patient: Philip Robbins    ZOX:[id number]  DOA: 03/08/2023     Date of Service: the patient was seen and examined on 03/14/2023  Chief Complaint  Patient presents with   Fever   Brief hospital course:  SHADARIUS JESKE is a 82 y.o. male with medical history significant of CLL under surveillance, BPH, HTN, IIDM, hypothyroidism, HLD, chronic leukocytosis presented with altered mentations hypoxia.   His symptoms started last Friday, patient went to have a left neck lymph node biopsy, after the procedure patient developed URI-like symptoms with runny nose sore throat, yesterday, patient developed strong burning sensation in the urine and dark-colored urine and went to see urgent care was diagnosed with UTI and started on antibiotics.  Patient continued to feel malaise generalized weakness, nauseous but no vomiting and no abdominal pain overnight and this morning patient was found in the bed unresponsive with large vomitus on his bed and foams in his mouth.  Family called EMS, EMS arrived and found patient hypoxic O2 saturation 95% on room air and patient was placed on 15 L NRB and sent to ED.  Fever 101.8.  Wife also witnessed patient had some twitching movement of his arms and suspect whether he had seizure.  At baseline patient has BPH appears to be poorly controlled, he reported Brueggen screams and frequent nocturnal urination but no history of UTI before.   ED Course: Fever 101.2, tachycardia hypotensive oxygen saturation 91% on 6 L.  Chest x-ray showed bilateral infiltrates indicating for pneumonia.  WBC 35.1.  Hemoglobin 12, sodium 118, K4.1, bicarb 12, creatinine 1.1, troponin 64> 99, AST 67, ALT 46   Patient was given 3 L IV bolus and started on ceftriaxone, azithromycin Assessment and Plan:  Sepsis, severe, Resolved -Evidenced by new onset of hypoxia, elevated temperature and elevated lactic acid with signs of endorgan damage of acute encephalopathy and source  of infection likely aspiration pneumonia and UTI. s/p IVF given as per protocol.  -Continue ceftriaxone and azithromycin to cover pneumonia and UTI for 5 days   Acute hypoxic respiratory failure secondary to pneumonia and COPD/asthma exacerbation  Patient does not have diagnosis of COPD or asthma Patient was wheezing, resolved on 8/28 Continue above antibiotics for pneumonia 8/27  prednisone 40 mg p.o. daily for 3 days Started Breo Ellipta inhaler and DuoNeb TID 8/28 respiratory failure resolved, patient saturating well on room air. 8/29 dyspnea on exertion, hypoxia on ambulation, continue supplemental O2 inhalation as needed 8/30 started Lasix 40 mg IV twice daily for 2 days   Aspiration pneumonia -Likely secondary to repeated vomiting at night, management as above  UTI -Likely secondary to uncontrolled BPH, continue antibiotics -Check PVR, outpatient follow-up with urology for urodynamic study -Continue Flomax  acute hypotonic hyponatremia -Hypovolemic, likely secondary to acute GI loss from repeated vomiting overnight and sepsis Serum osmolality 257 low, TSH 2.6 within normal range Nephro consulted, s/p saline 3% IVF discontinued on 8/27 Na 118------134--131 Sodium level fluctuating, continue to monitor. Continue fluid restriction 1.5 L/day   # Hypophosphatemia, Phos repleted.  Resolved # Hypomagnesemia, mag repleted.  Resolved Monitor electrolytes daily.  And replete as needed  # B/L Humral fracture No h/o fall, possible trauma while transportation as pt was combative and he was held strongly by EMS.  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  Repeat sodium level and likely will start hypertonic saline, consult nephrology. -Seizure precaution.   Troponin elevation -No chest pains, EKG showed no acute ST changes and the poor R wave progression appears to be chronic. -Etiology likely related to sepsis and acute hypoxia and demanding ischemia.  Treat sepsis, repeat troponin this evening.   CLL with chronic leukocytosis -Peripheral smear is being reviewed to rule out acute transformation -Management of sepsis as above 8/29 WBC count is significantly elevated, could be due to steroids.  Monitor CBC daily  HTN Blood pressure improved 8/27 resumed lisinopril 20 mg p.o. 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 weekly, follow with PCP to repeat vitamin D level after 3 to 6 months.  Vitamin B12 deficiency: Started vitamin B12 1000 mcg oral supplement, avoided IM injection due to bilateral humeral fractures Follow-up PCP to repeat vitamin B12 level after 3 to 6 months.  Iron deficiency, transferrin saturation 6%, started oral iron supplement.  Avoided IV iron due to active infection. Follow-up with PCP to repeat iron profile after 3 to 6 months Folic acid level 8.3, at lower end, started oral supplement for 3 months.   Body mass index is 34.26 kg/m.  Interventions:  Diet: Heart healthy diet DVT Prophylaxis: Subcutaneous Lovenox   Advance goals of care discussion: Full code  Family Communication: family was present at bedside, at the time of interview.  The pt provided permission to discuss medical plan with the family. Opportunity was given to ask question and all questions were  answered satisfactorily.   Disposition:  Pt is from Home, admitted with hyponatremia, pneumonia, respiratory failure, UTI, b/l.  Humerus fracture, still has resp failure and low sodium, which precludes a safe discharge. Discharge to SNF , when medically stable.  Subjective: No significant overnight events.  Patient's pain is under control, 2/10.  Breathing is improving but is still requiring supplemental O2 nation, currently on 2 L oxygen. Patient denied any other complaints.   Physical Exam: General: NAD, lying comfortably Appear in no distress, affect appropriate Eyes: PERRLA ENT: Oral Mucosa Clear, moist  Neck: no JVD,  Cardiovascular: S1 and S2 Present, no Murmur,  Respiratory: Good air entry bilaterally, b/l  crackles and no wheezing. Abdomen: Bowel Sound present, Soft and no tenderness,  Skin: no rashes Extremities: no Pedal edema, no calf tenderness.  Bilateral shoulders slings intact. S/p Left shoulder surgery. Neurologic: without any new focal findings Gait not checked due to patient safety concerns  Vitals:   03/14/23 0348 03/14/23 0741 03/14/23 0806 03/14/23 1217  BP: (!) 140/59  (!) 134/51 (!) 128/52  Pulse: 80  84 99  Resp: 16  17 17   Temp: 98.4 F (36.9 C)  (!) 97.5 F (36.4 C) 98.3 F (36.8 C)  TempSrc: Oral  Oral   SpO2: 93% 98% 94% 94%  Weight:      Height:        Intake/Output Summary (Last 24 hours) at 03/14/2023 1452 Last data filed at 03/14/2023 1100 Gross per 24 hour  Intake 1065.13 ml  Output 2300 ml  Net -1234.87  ml   Filed Weights   03/10/23 0400 03/12/23 0500 03/13/23 0913  Weight: 103.3 kg 107 kg 100.7 kg    Data Reviewed: I have personally reviewed and interpreted daily labs, tele strips, imagings as discussed above. I reviewed all nursing notes, pharmacy notes, vitals, pertinent old records I have discussed plan of care as described above with RN and patient/family.  CBC: Recent Labs  Lab 03/08/23 0620 03/09/23 0436 03/10/23 0430  03/11/23 0418 03/12/23 0450 03/13/23 0342 03/14/23 0450  WBC 35.1*   < > 29.7* 31.5* 52.3* 54.1* 60.5*  NEUTROABS 19.8*  --   --   --   --   --   --   HGB 12.1*   < > 10.5* 9.7* 9.5* 9.6* 9.7*  HCT 36.5*   < > 31.5* 28.8* 28.2* 28.8* 29.9*  MCV 90.1   < > 88.5 87.3 87.9 87.5 90.9  PLT 218   < > 243 267 317 363 408*   < > = values in this interval not displayed.   Basic Metabolic Panel: Recent Labs  Lab 03/10/23 0430 03/10/23 0918 03/11/23 0418 03/12/23 0450 03/13/23 0342 03/14/23 0450  NA 132* 132* 133* 131* 134* 131*  K 4.0  --  4.2 3.8 4.3 4.7  CL 103  --  104 98 96* 99  CO2 22  --  23 24 24 23   GLUCOSE 148*  --  203* 195* 269* 177*  BUN 11  --  12 17 22 16   CREATININE 0.75  --  0.63 0.72 0.79 0.64  CALCIUM 8.4*  --  8.4* 8.5* 9.2 7.8*  MG 1.5*  --  1.9 1.8 1.8 2.1  PHOS 1.8*  --  3.3 3.0 3.4 3.2    Studies: CT SHOULDER RIGHT WO CONTRAST  Result Date: 03/13/2023 CLINICAL DATA:  Right humerus fracture. 82 year old male who sustained a right 3 part minimally displaced proximal humerus fracture and left 4 part proximal humerus fracture EXAM: CT OF THE UPPER RIGHT EXTREMITY WITHOUT CONTRAST TECHNIQUE: Multidetector CT imaging of the upper right extremity was performed according to the standard protocol. RADIATION DOSE REDUCTION: This exam was performed according to the departmental dose-optimization program which includes automated exposure control, adjustment of the mA and/or kV according to patient size and/or use of iterative reconstruction technique. COMPARISON:  Right shoulder radiographs 03/08/2023 and 03/13/2023 FINDINGS: Bones/Joint/Cartilage There is diffuse decreased bone mineralization. There is an acute, comminuted fracture of the right proximal humerus involving the humeral head, surgical neck, and greater and lesser tuberosities. There is posterior subluxation of the humeral head, which rests on the posterior aspect of the glenoid, where there is a triangular bone  defect/depression within the medial humeral head (axial series 3 images 32 through 47) measuring up to approximately 2.4 cm in depth. There is up to approximately 4 mm cortical step-off at the lateral aspect of the surgical neck (coronal series 6 image 113). Up to 6 mm diastasis of the greater tuberosity fractures within the superolateral humeral head (coronal series 6, image 118). Up to approximately 1.4 cm impaction of the anterior aspect of the surgical neck into the inferior aspect of the humeral head (sagittal series 8, image 48). There is a tiny defect measuring up to 3 mm in depth at the posteroinferior aspect of the glenoid adjacent to the posterior subluxed humeral head that may represent a tiny corner fracture (axial series 2, image 86). Severe acromioclavicular joint space narrowing with subchondral sclerosis and mild-to-moderate peripheral osteophytosis. Ligaments Suboptimally assessed by  CT. Muscles and Tendons Mild-to-moderate supraspinatus muscle atrophy. Heterogeneous likely blood products extending into the subscapularis muscle anterior to the proximal humeral fractures. Soft tissues Mild fluid within the subacromial subdeltoid bursa. Scattered linear and ground-glass subsegmental atelectasis within the visualized aspect of the right upper lung. Trace pericardial fluid. IMPRESSION: 1. Acute, comminuted fracture of the right proximal humerus involving the humeral head, surgical neck, and greater and lesser tuberosities. There is posterior subluxation of the humeral head, which rests on the posterior aspect of the glenoid, where there is a triangular bone defect/depression within the medial humeral head junction with the lesser tuberosity, measuring up to approximately 2.4 cm in depth. 2. Up to approximately 1.4 cm craniocaudal impaction of the anterior aspect of the surgical neck into the inferior aspect of the humeral head. 3. There is a tiny defect measuring up to 3 mm in depth at the  posteroinferior aspect of the glenoid adjacent to the posterior subluxed humeral head that may represent a tiny corner fracture. Electronically Signed   By: Neita Garnet M.D.   On: 03/13/2023 19:38    Scheduled Meds:  acetaminophen  1,000 mg Oral Q8H   vitamin C  500 mg Oral Daily   Chlorhexidine Gluconate Cloth  6 each Topical Q0600   vitamin B-12  1,000 mcg Oral Daily   diclofenac Sodium  2 g Topical QID   docusate sodium  100 mg Oral BID   enoxaparin (LOVENOX) injection  40 mg Subcutaneous Q24H   fluticasone furoate-vilanterol  1 puff Inhalation Daily   folic acid  1 mg Oral Daily   furosemide  40 mg Intravenous BID   guaiFENesin  600 mg Oral BID   insulin aspart  0-15 Units Subcutaneous TID WC   insulin aspart  0-5 Units Subcutaneous QHS   insulin aspart  4 Units Subcutaneous TID WC   iron polysaccharides  150 mg Oral Daily   levothyroxine  50 mcg Oral Q0600   lisinopril  20 mg Oral Daily   multivitamin with minerals  1 tablet Oral Daily   pantoprazole  40 mg Oral BID   Followed by   Melene Muller ON 03/15/2023] pantoprazole  40 mg Oral Daily   Ensure Max Protein  11 oz Oral BID   tamsulosin  0.4 mg Oral QPC supper   Vitamin D (Ergocalciferol)  50,000 Units Oral Q7 days   Continuous Infusions:  sodium chloride 75 mL/hr at 03/14/23 0640   tranexamic acid      PRN Meds: alum & mag hydroxide-simeth, bisacodyl, chlorpheniramine-HYDROcodone, hydrALAZINE, HYDROmorphone (DILAUDID) injection, ipratropium-albuterol, menthol-cetylpyridinium **OR** phenol, metoCLOPramide **OR** metoCLOPramide (REGLAN) injection, ondansetron **OR** ondansetron (ZOFRAN) IV, oxyCODONE, oxyCODONE, senna-docusate  Time spent: 40 minutes  Author: Gillis Santa. MD [facility] 03/14/2023 2:52 PM  To reach On-call, see care teams to locate the attending and reach out to them via [email or web address]. If 7PM-7AM, please contact night-coverage If you still have difficulty reaching the attending provider, please page  the [facility] (Director on Call) for [facility] on amion for assistance.

## 2023-03-14 NOTE — Progress Notes (Signed)
Mobility Specialist - Progress Note   Pre-mobility:SpO2 (93) During mobility: HR(119), SpO2(88) Post-mobility: HR(98) pushed to 3.5 L temporarily to recover to 95; put back on RA. (92)     03/14/23 0958  Mobility  Activity Ambulated with assistance in hallway  Level of Assistance Contact guard assist, steadying assist  Assistive Device None  Distance Ambulated (ft) 50 ft  Range of Motion/Exercises Active  RUE Weight Bearing NWB  LUE Weight Bearing NWB  Activity Response Tolerated well  Mobility Referral Yes  $Mobility charge 1 Mobility  Mobility Specialist Start Time (ACUTE ONLY) 0932  Mobility Specialist Stop Time (ACUTE ONLY) [id number]  Mobility Specialist Time Calculation (min) (ACUTE ONLY) 26 min   Pt resting in bed on 2L upon entry. Ice pack disconnected. Pt STS MinA/ModA with lower back push to standing due to NWB status of Bilateral UE and ambulates to hallway around NS. Pt gait is moderately stable with CGA. Pt endorses drowziness. Pt returned to bed and left with needs in reach. Ice pack reconnected. RN changed LUE bandage.   Johnathan Hausen Mobility Specialist 03/14/23, 10:07 AM

## 2023-03-14 NOTE — [place] FL2 (Signed)
[place] MEDICAID FL2 LEVEL OF CARE FORM     IDENTIFICATION  Patient Name: Philip Robbins Birthdate: June 06, 1941 Sex: male Admission Date (Current Location): 03/08/2023  [facility] and [place] Number:  Chiropodist and Address:  [facility], [address], [place], [place] [address]      Provider Number: [id number]  Attending Physician Name and Address:  Gillis Santa, MD  Relative Name and Phone Number:  Gay Filler (Spouse)  [phone] [facility])    Current Level of Care: Hospital Recommended Level of Care: Skilled Nursing Facility Prior Approval Number:    Date Approved/Denied:   PASRR Number: [id number]  Discharge Plan:      Current Diagnoses: Patient Active Problem List   Diagnosis Date Noted   CAP (community acquired pneumonia) 03/08/2023   Pneumonia 03/08/2023   Sepsis (HCC) 03/08/2023   Hyponatremia 03/08/2023    Orientation RESPIRATION BLADDER Height & Weight     Self, Time, Situation, Place  O2 External catheter Weight: 222 lb (100.7 kg) Height:  5' 7.5" (171.5 cm)  BEHAVIORAL SYMPTOMS/MOOD NEUROLOGICAL BOWEL NUTRITION STATUS      Continent Diet  AMBULATORY STATUS COMMUNICATION OF NEEDS Skin   Limited Assist Verbally Surgical wounds, Bruising (L shoulder)                       Personal Care Assistance Level of Assistance  Bathing, Feeding, Dressing Bathing Assistance: Maximum assistance Feeding assistance: Limited assistance Dressing Assistance: Maximum assistance     Functional Limitations Info             SPECIAL CARE FACTORS FREQUENCY  PT (By licensed PT), OT (By licensed OT)     PT Frequency: 5 times per week OT Frequency: 5 times per week            Contractures      Additional Factors Info  Code Status, Allergies Code Status Info: full Allergies Info: nka           Current Medications (03/14/2023):  This is the current hospital active medication list Current Facility-Administered  Medications  Medication Dose Route Frequency Provider Last Rate Last Admin   0.9 %  sodium chloride infusion   Intravenous Continuous Signa Kell, MD 75 mL/hr at 03/14/23 0640 Infusion Verify at 03/14/23 0640   acetaminophen (TYLENOL) tablet 1,000 mg  1,000 mg Oral Q8H Signa Kell, MD   1,000 mg at 03/14/23 0549   alum & mag hydroxide-simeth (MAALOX/MYLANTA) 200-200-20 MG/5ML suspension 30 mL  30 mL Oral Q4H PRN Signa Kell, MD       ascorbic acid (VITAMIN C) tablet 500 mg  500 mg Oral Daily Signa Kell, MD   500 mg at 03/14/23 9563   bisacodyl (DULCOLAX) suppository 10 mg  10 mg Rectal Daily PRN Signa Kell, MD       Chlorhexidine Gluconate Cloth 2 % PADS 6 each  6 each Topical Q0600 Signa Kell, MD   6 each at 03/14/23 0550   chlorpheniramine-HYDROcodone (TUSSIONEX) 10-8 MG/5ML suspension 5 mL  5 mL Oral Q12H PRN Signa Kell, MD   5 mL at 03/10/23 1010   cyanocobalamin (VITAMIN B12) tablet 1,000 mcg  1,000 mcg Oral Daily Signa Kell, MD   1,000 mcg at 03/14/23 8756   diclofenac Sodium (VOLTAREN) 1 % topical gel 2 g  2 g Topical QID Signa Kell, MD   2 g at 03/13/23 2137   docusate sodium (COLACE) capsule 100 mg  100 mg Oral BID Allena Katz,  Learta Codding, MD   100 mg at 03/14/23 0916   enoxaparin (LOVENOX) injection 40 mg  40 mg Subcutaneous Q24H Signa Kell, MD   40 mg at 03/14/23 0917   fluticasone furoate-vilanterol (BREO ELLIPTA) 200-25 MCG/ACT 1 puff  1 puff Inhalation Daily Signa Kell, MD   1 puff at 03/14/23 4696   folic acid (FOLVITE) tablet 1 mg  1 mg Oral Daily Signa Kell, MD   1 mg at 03/14/23 2952   furosemide (LASIX) injection 40 mg  40 mg Intravenous BID Gillis Santa, MD   40 mg at 03/14/23 0918   guaiFENesin (MUCINEX) 12 hr tablet 600 mg  600 mg Oral BID Signa Kell, MD   600 mg at 03/14/23 8413   hydrALAZINE (APRESOLINE) injection 5 mg  5 mg Intravenous Q6H PRN Signa Kell, MD       HYDROmorphone (DILAUDID) injection 0.2-0.4 mg  0.2-0.4 mg Intravenous Q4H PRN Signa Kell, MD    0.4 mg at 03/14/23 0356   insulin aspart (novoLOG) injection 0-15 Units  0-15 Units Subcutaneous TID [facility] Signa Kell, MD   2 Units at 03/14/23 2440   insulin aspart (novoLOG) injection 0-5 Units  0-5 Units Subcutaneous QHS Signa Kell, MD   3 Units at 03/13/23 2132   insulin aspart (novoLOG) injection 4 Units  4 Units Subcutaneous TID WC Signa Kell, MD   4 Units at 03/14/23 0926   ipratropium-albuterol (DUONEB) 0.5-2.5 (3) MG/3ML nebulizer solution 3 mL  3 mL Nebulization Q4H PRN Gillis Santa, MD       iron polysaccharides (NIFEREX) capsule 150 mg  150 mg Oral Daily Signa Kell, MD   150 mg at 03/14/23 0916   levothyroxine (SYNTHROID) tablet 50 mcg  50 mcg Oral Q0600 Signa Kell, MD   50 mcg at 03/14/23 0549   lisinopril (ZESTRIL) tablet 20 mg  20 mg Oral Daily Signa Kell, MD   20 mg at 03/14/23 1027   menthol-cetylpyridinium (CEPACOL) lozenge 3 mg  1 lozenge Oral PRN Signa Kell, MD       Or   phenol (CHLORASEPTIC) mouth Scaff[place] 1 [place]  1 [place] Mouth/Throat PRN Signa Kell, MD       metoCLOPramide (REGLAN) tablet 5-10 mg  5-10 mg Oral Q8H PRN Signa Kell, MD       Or   metoCLOPramide (REGLAN) injection 5-10 mg  5-10 mg Intravenous Q8H PRN Signa Kell, MD       multivitamin with minerals tablet 1 tablet  1 tablet Oral Daily Signa Kell, MD   1 tablet at 03/14/23 0917   ondansetron [facility]) tablet 4 mg  4 mg Oral Q6H PRN Signa Kell, MD       Or   ondansetron [facility]) injection 4 mg  4 mg Intravenous Q6H PRN Signa Kell, MD       oxyCODONE (Oxy IR/ROXICODONE) immediate release tablet 10-15 mg  10-15 mg Oral Q4H PRN Signa Kell, MD   15 mg at 03/14/23 1002   oxyCODONE (Oxy IR/ROXICODONE) immediate release tablet 5-10 mg  5-10 mg Oral Q4H PRN Signa Kell, MD   5 mg at 03/14/23 0016   pantoprazole (PROTONIX) EC tablet 40 mg  40 mg Oral BID Signa Kell, MD   40 mg at 03/14/23 2536   Followed by   Melene Muller ON 03/15/2023] pantoprazole (PROTONIX) EC tablet 40 mg  40 mg Oral Daily Signa Kell, MD       protein supplement (ENSURE MAX) liquid  11 oz Oral BID Signa Kell, MD  11 oz at 03/14/23 0926   senna-docusate (Senokot-S) tablet 1 tablet  1 tablet Oral QHS PRN Signa Kell, MD       tamsulosin [facility]) capsule 0.4 mg  0.4 mg Oral QPC supper Signa Kell, MD   0.4 mg at 03/13/23 1747   tranexamic acid (CYKLOKAPRON) IVPB 1,000 mg  1,000 mg Intravenous Once Signa Kell, MD       Vitamin D (Ergocalciferol) (DRISDOL) 1.25 MG (50000 UNIT) capsule 50,000 Units  50,000 Units Oral Q7 days Signa Kell, MD   50,000 Units at 03/10/23 1642     Discharge Medications: Please see discharge summary for a list of discharge medications.  Relevant Imaging Results:  Relevant Lab Results:   Additional Information SS #: [id number]  Aralynn Brake E Mersedes Alber, LCSW

## 2023-03-14 NOTE — Progress Notes (Signed)
Physical Therapy Treatment Patient Details Name: Philip Robbins MRN: [id number] DOB: [birth year] Today's Date: 03/14/2023   History of Present Illness Pt is an 82 y.o. male presenting to hospital 03/08/23 with c/o AMS and hypoxia; wife concerned pt was having a seizure.  Imaging showing: Comminuted fracture of left humeral head and neck (Posterior subluxation of the humeral head, with anterior displacement of the humeral neck); mildly displaced, comminuted fracture of the right humeral head and neck.  Per notes (prior to hospitalization) "Patient went for a left neck lymph node biopsy on Friday. Afterwards, patient started to have upper respiratory infection symptoms. Then next day, he started having urinary infection symptoms. Was taken to urgent care where he was diagnosed with urinary tract infection and started on levofloxacin. Patient last night became hypoxic on room air, febrile and possible seizure".  Pt admitted with severe sepsis, acute hyponatremia, question seizure, aspiration PNA, UTI.  PMH includes UTI, CLL under surveillance, BPH, htn, IIDM, hypothyroidism, HLD, chronic leukocytosis.    PT Comments  The patient is now s/p L TSA. He and his daughter were educated on ROM and mobility precautions following the procedure. The daughter was shown how to complete PROM exercises of the shoulder, but also educated that she is not expected to complete with the patient at this time d/t pain limitations. The pt tolerates minimum passive ROM of the shoulder at this time. Pain level increased to 8/10 following PROM of the shoulder. RN notified and present to give medications. The pt is expected to have R TSA on 9/3. Mobility will continue to be assessed. At this time it is expected that he will continue to required skilled services at d/c in order to safely optimize mobility.     If plan is discharge home, recommend the following: A little help with walking and/or transfers;A little help with  bathing/dressing/bathroom;Assistance with cooking/housework;Assistance with feeding;Direct supervision/assist for medications management;Assist for transportation;Help with stairs or ramp for entrance   Can travel by private vehicle     No  Equipment Recommendations  None recommended by PT    Recommendations for Other Services OT consult     Precautions / Restrictions Precautions Precautions: Shoulder Type of Shoulder Precautions: PROM 90 degrees shoulder flexion, 30 degree ER, elbow/wrist ROM exs, pendulums Shoulder Interventions: Shoulder sling/immobilizer;At all times Precaution Comments: pt now s/p L TSA on 8/30 Required Braces or Orthoses: Sling Restrictions Weight Bearing Restrictions: Yes RUE Weight Bearing: Non weight bearing LUE Weight Bearing: Non weight bearing Other Position/Activity Restrictions: L TSA on 8/30, plan for R TSA on 9/3     Mobility  Bed Mobility                    Transfers                        Ambulation/Gait                   Stairs             Wheelchair Mobility     Tilt Bed    Modified Rankin (Stroke Patients Only)       Balance                                            Cognition Arousal: Alert Behavior During Therapy: [name] for tasks assessed/performed Overall  Cognitive Status: Within Functional Limits for tasks assessed                                          Exercises Shoulder Exercises Shoulder Flexion: PROM, Left (Pt able to tolerate x2 PROM shoulder flexion limited to ~70 degrees. Pt instructed in pursed lip breathing during the exercise for pain control.) Elbow Flexion: AAROM, Left, 20 reps, Supine Wrist Flexion: AROM, 20 reps, Supine Wrist Extension: AROM, 20 reps, Supine Digit Composite Flexion: AROM, 20 reps, Supine    General Comments        Pertinent Vitals/Pain Pain Assessment Pain Assessment: 0-10 Pain Score: 8  Pain Descriptors /  Indicators: Discomfort, Aching, Operative site guarding, Tightness Pain Intervention(s): Monitored during session, Patient requesting pain meds-RN notified, Ice applied    Home Living                          Prior Function            PT Goals (current goals can now be found in the care plan section) Acute Rehab PT Goals Patient Stated Goal: to improve mobility PT Goal Formulation: With patient Time For Goal Achievement: 03/24/23 Potential to Achieve Goals: Good Progress towards PT goals: Progressing toward goals    Frequency    Min 1X/week      PT Plan      Co-evaluation              AM-PAC PT "6 Clicks" Mobility   Outcome Measure  Help needed turning from your back to your side while in a flat bed without using bedrails?: A Lot Help needed moving from lying on your back to sitting on the side of a flat bed without using bedrails?: A Lot Help needed moving to and from a bed to a chair (including a wheelchair)?: A Little Help needed standing up from a chair using your arms (e.g., wheelchair or bedside chair)?: A Little Help needed to walk in hospital room?: A Little Help needed climbing 3-5 steps with a railing? : A Lot 6 Click Score: 15    End of Session   Activity Tolerance: Patient tolerated treatment well;Patient limited by pain Patient left: in bed;with call bell/phone within reach;with bed alarm set;with family/visitor present;with nursing/sitter in room Nurse Communication: Mobility status PT Visit Diagnosis: Unsteadiness on feet (R26.81);Other abnormalities of gait and mobility (R26.89);Muscle weakness (generalized) (M62.81);Pain Pain - Right/Left:  (Bilateral) Pain - part of body: Shoulder     Time: 0981-1914 PT Time Calculation (min) (ACUTE ONLY): 22 min  Charges:    $Therapeutic Exercise: 8-22 mins PT General Charges $$ ACUTE PT VISIT: 1 Visit                     10:37 AM, 03/14/23 Enid Maultsby A. Mordecai Maes PT, DPT Physical  Therapist - [facility] A Sidra Oldfield 03/14/2023, 10:34 AM

## 2023-03-14 NOTE — Progress Notes (Signed)
   Subjective: 1 Day Post-Op Procedure(s) (LRB): REVERSE SHOULDER ARTHROPLASTY (Left) Patient reports pain as mild.   Patient is well, and has had no acute complaints or problems Denies any CP, SOB, ABD pain. We will continue therapy today.   Right upper extremity pain well-controlled.  He has a unstable right shoulder dislocation with large Hill-Sachs lesion  Objective: Vital signs in last 24 hours: Temp:  [97 F (36.1 C)-98.4 F (36.9 C)] 97.5 F (36.4 C) (08/31 0806) Pulse Rate:  [73-114] 84 (08/31 0806) Resp:  [16-33] 17 (08/31 0806) BP: (117-161)/(51-66) 134/51 (08/31 0806) SpO2:  [85 %-98 %] 94 % (08/31 0806) Weight:  [100.7 kg] 100.7 kg (08/30 0913)  Intake/Output from previous day: 08/30 0701 - 08/31 0700 In: 2165.1 [P.O.:120; I.V.:1945.1; IV Piggyback:100] Out: 1500 [Urine:1000; Drains:150; Blood:350] Intake/Output this shift: No intake/output data recorded.  Recent Labs    03/12/23 0450 03/13/23 0342 03/14/23 0450  HGB 9.5* 9.6* 9.7*   Recent Labs    03/13/23 0342 03/14/23 0450  WBC 54.1* 60.5*  RBC 3.29* 3.29*  HCT 28.8* 29.9*  PLT 363 408*   Recent Labs    03/13/23 0342 03/14/23 0450  NA 134* 131*  K 4.3 4.7  CL 96* 99  CO2 24 23  BUN 22 16  CREATININE 0.79 0.64  GLUCOSE 269* 177*  CALCIUM 9.2 7.8*   No results for input(s): "LABPT", "INR" in the last 72 hours.  EXAM General - Patient is Alert, Appropriate, and Oriented Left upper extremity - Neurovascular intact Sensation intact distally Intact pulses distally No cellulitis present Compartment soft Dressing - dressing C/D/I and no drainage, Hemovac removed Motor Function - intact, moving foot and toes/left and right upper extremity digits well on exam.   Past Medical History:  Diagnosis Date   Diabetes type 2, controlled (HCC)    Hypothyroid    UTI (urinary tract infection)     Assessment/Plan:   1 Day Post-Op Procedure(s) (LRB): REVERSE SHOULDER ARTHROPLASTY  (Left) Principal Problem:   Pneumonia Active Problems:   CAP (community acquired pneumonia)   Sepsis (HCC)   Hyponatremia  Estimated body mass index is 34.26 kg/m as calculated from the following:   Height as of this encounter: 5' 7.5" (1.715 m).   Weight as of this encounter: 100.7 kg. Advance diet Up with therapy Left shoulder postoperative pain well-controlled Labs and vital signs are stable, hemoglobin 9.7  Patient with right shoulder posterior dislocation and with displaced greater tuberosity fracture/very large Hill-Sachs lesion that is unstable and likely to lead to repetitive dislocations.  A right reverse total shoulder replacement is scheduled on 03/17/2023.  Patient will stay inpatient until this procedure.  He will be n.p.o. after midnight on 03/16/2023 and we will hold his Lovenox 24 hours in advance of surgery.  DVT Prophylaxis - Lovenox, TED hose, and SCDs    T. Cranston Neighbor, PA-C [facility] 03/14/2023, 8:57 AM

## 2023-03-14 NOTE — Progress Notes (Signed)
Occupational Therapy Treatment Patient Details Name: Philip Robbins MRN: [id number] DOB: 1940-10-03 Today's Date: 03/14/2023   History of present illness Pt is an 82 y.o. male presenting to hospital 03/08/23 with c/o AMS and hypoxia; wife concerned pt was having a seizure.  Imaging showing: Comminuted fracture of left humeral head and neck (Posterior subluxation of the humeral head, with anterior displacement of the humeral neck); mildly displaced, comminuted fracture of the right humeral head and neck.  Per notes (prior to hospitalization) "Patient went for a left neck lymph node biopsy on Friday. Afterwards, patient started to have upper respiratory infection symptoms. Then next day, he started having urinary infection symptoms. Was taken to urgent care where he was diagnosed with urinary tract infection and started on levofloxacin. Patient last night became hypoxic on room air, febrile and possible seizure".  Pt admitted with severe sepsis, acute hyponatremia, question seizure, aspiration PNA, UTI.  PMH includes UTI, CLL under surveillance, BPH, htn, IIDM, hypothyroidism, HLD, chronic leukocytosis.   OT comments  Patient received semi-reclined in bed and agreeable to OT, daughter present. Re-evaluation completed this date s/p L rTSA on 81/30/24. Pt/daughter instructed in polar care mgt, sling/immobilizer mgt, LUE precautions, adaptive strategies for ADLs, positioning for sleep, and home/routines modifications to maximize falls prevention, safety, and independence. Handout provided. LUE sling and polar care adjusted to improve comfort, optimize positioning, and to maximize skin integrity. Pt/daughter verbalized understanding of all education provided. Pt deferred further self-care tasks and functional transfers due to BUE pain and fatigue. Pt left as received with all needs in reach. OT will continue to follow acutely.       If plan is discharge home, recommend the following:  A lot of help with walking  and/or transfers;A lot of help with bathing/dressing/bathroom;Assistance with cooking/housework;Direct supervision/assist for medications management;Assist for transportation;Direct supervision/assist for financial management;Help with stairs or ramp for entrance;Assistance with feeding   Equipment Recommendations  BSC/3in1;Wheelchair (measurements OT);Tub/shower seat    Recommendations for Other Services      Precautions / Restrictions Precautions Precautions: Shoulder Type of Shoulder Precautions: LUE: PROM 90 degrees shoulder flexion, 30 degree ER, elbow/wrist ROM excs, pendulums Shoulder Interventions: Shoulder sling/immobilizer;At all times Precaution Booklet Issued: Yes (comment) Precaution Comments: pt now s/p L TSA on 8/30 Required Braces or Orthoses: Sling Restrictions Weight Bearing Restrictions: Yes RUE Weight Bearing: Non weight bearing LUE Weight Bearing: Non weight bearing Other Position/Activity Restrictions: L TSA on 8/30, plan for R TSA on 9/3       Mobility Bed Mobility Overal bed mobility: Needs Assistance             General bed mobility comments: Pt deferred OOB mobility 2/2 fatigue. Max A to reposition in bed, pillows provided underneath LUE to reduce edema.    Transfers Overall transfer level: Needs assistance Equipment used: None               General transfer comment: Pt deferred 2/2 fatigue     Balance           ADL either performed or assessed with clinical judgement   ADL Overall ADL's : Needs assistance/impaired       Upper Body Dressing : Maximal assistance Upper Body Dressing Details (indicate cue type and reason): to adjust LUE sling and polar care     General ADL Comments: Pt deferred further ADL tasks and functional transfers due to BUE pain and fatigue    Extremity/Trunk Assessment Upper Extremity Assessment Upper Extremity Assessment: RUE deficits/detail;LUE deficits/detail  RUE Deficits / Details: wrist/hand ROM  appears WFL RUE: Shoulder pain with ROM RUE Coordination: decreased gross motor LUE Deficits / Details: Unable to tolerate AROM of elbow/wrist; swelling noted throughout hand and forearm, pt completed hand pumps using exercise ball on shoulder immobilizer LUE: Shoulder pain with ROM;Unable to fully assess due to pain LUE Coordination: decreased fine motor;decreased gross motor   Lower Extremity Assessment Lower Extremity Assessment: Generalized weakness   Cervical / Trunk Assessment Cervical / Trunk Assessment: Normal    Vision Patient Visual Report: No change from baseline     Perception     Praxis      Cognition Arousal: Alert Behavior During Therapy: WFL for tasks assessed/performed Overall Cognitive Status: Within Functional Limits for tasks assessed            Exercises Other Exercises Other Exercises: Education provided re: LUE NWB, AROM of uninvolved joints including hand/wrist/elbow, avoid lifting/pulling/pushing activities with LUE, how to don/doff sling and polar care system, LB dressing AE (reacher), compensatory dressing techniques ("first in last out", seesaw method), use of exercise ball for hand pumps and elevation of LUE to reduce edema    Shoulder Instructions       General Comments      Pertinent Vitals/ Pain       Pain Assessment Pain Assessment: 0-10 Pain Score: 8  Pain Location: B shoulders Pain Descriptors / Indicators: Discomfort, Aching, Operative site guarding, Tightness Pain Intervention(s): Limited activity within patient's tolerance, Monitored during session, Premedicated before session, Ice applied  Home Living        Prior Functioning/Environment              Frequency  Min 1X/week        Progress Toward Goals  OT Goals(current goals can now be found in the care plan section)  Progress towards OT goals: Progressing toward goals  Acute Rehab OT Goals Patient Stated Goal: go to rehab OT Goal Formulation: With  patient/family Time For Goal Achievement: 03/28/23 Potential to Achieve Goals: Good  Plan      Co-evaluation                 AM-PAC OT "6 Clicks" Daily Activity     Outcome Measure   Help from another person eating meals?: A Lot Help from another person taking care of personal grooming?: A Lot Help from another person toileting, which includes using toliet, bedpan, or urinal?: A Lot Help from another person bathing (including washing, rinsing, drying)?: A Lot Help from another person to put on and taking off regular upper body clothing?: A Lot Help from another person to put on and taking off regular lower body clothing?: A Lot 6 Click Score: 12    End of Session Equipment Utilized During Treatment: Oxygen;Other (comment) (B slings)  OT Visit Diagnosis: Muscle weakness (generalized) (M62.81);Other abnormalities of gait and mobility (R26.89)   Activity Tolerance Patient limited by fatigue;Patient limited by pain   Patient Left in bed;with call bell/phone within reach;with bed alarm set;with family/visitor present   Nurse Communication Mobility status;Precautions;Weight bearing status        Time: 3244-0102 OT Time Calculation (min): 25 min  Charges: OT General Charges $OT Visit: 1 Visit OT Treatments $Self Care/Home Management : 8-22 mins $Therapeutic Exercise: 8-22 mins  [facility] MS, OTR/L ascom [phone]  03/14/23, 1:10 PM

## 2023-03-14 NOTE — TOC Progression Note (Signed)
Transition of Care [facility]) - Progression Note    Patient Details  Name: SCHAFER STUDER MRN: [id number] Date of Birth: Jan 18, 1941  [facility] CM/SW Contact  Liliana Cline, LCSW Phone Number: 03/14/2023, 11:07 AM  Clinical Narrative:    CSW met with patient and patient's daughter at bedside. Obtained SS number so that SNF work up can be started. Patient's first choice is [facility], second choice is [facility]    Expected Discharge Plan: Skilled Nursing Facility Barriers to Discharge: Continued Medical Work up  Expected Discharge Plan and Services     Post Acute Care Choice: Home Health Living arrangements for the past 2 months: Single Family Home                           HH Arranged: PT HH Agency: [facility] Date [facility] Agency Contacted: 03/11/23   Representative spoke with at [facility] Agency: [place]   Social Determinants of Health (SDOH) Interventions SDOH Screenings   Food Insecurity: No Food Insecurity (03/10/2023)  Housing: Low Risk  (03/10/2023)  Transportation Needs: No Transportation Needs (03/10/2023)  Utilities: Not At Risk (03/10/2023)  Tobacco Use: Low Risk  (03/13/2023)    Readmission Risk Interventions     No data to display

## 2023-03-14 NOTE — Plan of Care (Signed)
  Problem: Activity: Goal: Ability to tolerate increased activity will improve Outcome: Progressing   Problem: Clinical Measurements: Goal: Ability to maintain a body temperature in the normal range will improve Outcome: Progressing   Problem: Respiratory: Goal: Ability to maintain adequate ventilation will improve Outcome: Progressing Goal: Ability to maintain a clear airway will improve Outcome: Progressing   Problem: Education: Goal: Ability to describe self-care measures that may prevent or decrease complications (Diabetes Survival Skills Education) will improve Outcome: Progressing Goal: Individualized Educational Video(s) Outcome: Progressing   Problem: Coping: Goal: Ability to adjust to condition or change in health will improve Outcome: Progressing   Problem: Fluid Volume: Goal: Ability to maintain a balanced intake and output will improve Outcome: Progressing   Problem: Health Behavior/Discharge Planning: Goal: Ability to identify and utilize available resources and services will improve Outcome: Progressing Goal: Ability to manage health-related needs will improve Outcome: Progressing   Problem: Metabolic: Goal: Ability to maintain appropriate glucose levels will improve Outcome: Progressing   Problem: Nutritional: Goal: Maintenance of adequate nutrition will improve Outcome: Progressing Goal: Progress toward achieving an optimal weight will improve Outcome: Progressing   Problem: Skin Integrity: Goal: Risk for impaired skin integrity will decrease Outcome: Progressing   Problem: Tissue Perfusion: Goal: Adequacy of tissue perfusion will improve Outcome: Progressing   Problem: Education: Goal: Knowledge of General Education information will improve Description: Including pain rating scale, medication(s)/side effects and non-pharmacologic comfort measures Outcome: Progressing   Problem: Health Behavior/Discharge Planning: Goal: Ability to manage  health-related needs will improve Outcome: Progressing   Problem: Clinical Measurements: Goal: Ability to maintain clinical measurements within normal limits will improve Outcome: Progressing Goal: Will remain free from infection Outcome: Progressing Goal: Diagnostic test results will improve Outcome: Progressing Goal: Respiratory complications will improve Outcome: Progressing Goal: Cardiovascular complication will be avoided Outcome: Progressing   Problem: Activity: Goal: Risk for activity intolerance will decrease Outcome: Progressing   Problem: Nutrition: Goal: Adequate nutrition will be maintained Outcome: Progressing   Problem: Coping: Goal: Level of anxiety will decrease Outcome: Progressing   Problem: Elimination: Goal: Will not experience complications related to bowel motility Outcome: Progressing Goal: Will not experience complications related to urinary retention Outcome: Progressing   Problem: Pain Managment: Goal: General experience of comfort will improve Outcome: Progressing   Problem: Safety: Goal: Ability to remain free from injury will improve Outcome: Progressing   Problem: Skin Integrity: Goal: Risk for impaired skin integrity will decrease Outcome: Progressing   Problem: Education: Goal: Knowledge of the prescribed therapeutic regimen will improve Outcome: Progressing Goal: Understanding of activity limitations/precautions following surgery will improve Outcome: Progressing Goal: Individualized Educational Video(s) Outcome: Progressing   Problem: Activity: Goal: Ability to tolerate increased activity will improve Outcome: Progressing   Problem: Pain Management: Goal: Pain level will decrease with appropriate interventions Outcome: Progressing

## 2023-03-14 NOTE — Consult Note (Signed)
PHARMACY CONSULT NOTE - ELECTROLYTES  Pharmacy Consult for Electrolyte Monitoring and Replacement   Recent Labs: Potassium (mmol/L)  Date Value  03/14/2023 4.7   Magnesium (mg/dL)  Date Value  16/04/9603 2.1   Calcium (mg/dL)  Date Value  54/03/8118 7.8 (L)   Albumin (g/dL)  Date Value  14/78/2956 2.7 (L)   Phosphorus (mg/dL)  Date Value  21/30/8657 3.2   Sodium (mmol/L)  Date Value  03/14/2023 131 (L)   Corrected Calcium: 8.84  Height: 5' 7.5" (171.5 cm) Weight: 100.7 kg (222 lb) IBW/kg (Calculated) : 67.25 Estimated Creatinine Clearance: 84.1 mL/min (by C-G formula based on SCr of 0.64 mg/dL).  Assessment  Philip Robbins is a 82 y.o. male presenting with pneumonia and hyponatremia. PMH significant for CLL, BPH, HTN, DM, hypothyroidism, HLD. Pharmacy has been consulted to monitor and replace electrolytes.  Diet: Heart healthy MIVF: NS@75ml /hr Pertinent medications: N/A  Goal of Therapy: Electrolytes within normal limits  Plan:  No replacement currently indicated Follow-up electrolytes with AM labs tomorrow  Thank you for allowing pharmacy to be a part of this patient's care.  Bettey Costa, PharmD Clinical Pharmacist 03/14/2023 8:00 AM

## 2023-03-15 DIAGNOSIS — J189 Pneumonia, unspecified organism: Secondary | ICD-10-CM | POA: Diagnosis not present

## 2023-03-15 LAB — CBC
HCT: 29.3 % — ABNORMAL LOW (ref 39.0–52.0)
Hemoglobin: 9.6 g/dL — ABNORMAL LOW (ref 13.0–17.0)
MCH: 29.2 pg (ref 26.0–34.0)
MCHC: 32.8 g/dL (ref 30.0–36.0)
MCV: 89.1 fL (ref 80.0–100.0)
Platelets: 418 10*3/uL — ABNORMAL HIGH (ref 150–400)
RBC: 3.29 MIL/uL — ABNORMAL LOW (ref 4.22–5.81)
RDW: 15.4 % (ref 11.5–15.5)
WBC: 69 10*3/uL (ref 4.0–10.5)
nRBC: 0 % (ref 0.0–0.2)

## 2023-03-15 LAB — PHOSPHORUS: Phosphorus: 2.4 mg/dL — ABNORMAL LOW (ref 2.5–4.6)

## 2023-03-15 LAB — MAGNESIUM: Magnesium: 1.7 mg/dL (ref 1.7–2.4)

## 2023-03-15 LAB — BASIC METABOLIC PANEL
Anion gap: 7 (ref 5–15)
BUN: 13 mg/dL (ref 8–23)
CO2: 26 mmol/L (ref 22–32)
Calcium: 8.1 mg/dL — ABNORMAL LOW (ref 8.9–10.3)
Chloride: 95 mmol/L — ABNORMAL LOW (ref 98–111)
Creatinine, Ser: 0.73 mg/dL (ref 0.61–1.24)
GFR, Estimated: >60 mL/min (ref >60)
Glucose, Bld: 202 mg/dL — ABNORMAL HIGH (ref 70–99)
Potassium: 4.3 mmol/L (ref 3.5–5.1)
Sodium: 128 mmol/L — ABNORMAL LOW (ref 135–145)

## 2023-03-15 LAB — HEPATIC FUNCTION PANEL
ALT: 67 U/L — ABNORMAL HIGH (ref 0–44)
AST: 27 U/L (ref 15–41)
Albumin: 2.7 g/dL — ABNORMAL LOW (ref 3.5–5.0)
Alkaline Phosphatase: 56 U/L (ref 38–126)
Bilirubin, Direct: 0.3 mg/dL — ABNORMAL HIGH (ref 0.0–0.2)
Indirect Bilirubin: 1.2 mg/dL — ABNORMAL HIGH (ref 0.3–0.9)
Total Bilirubin: 1.5 mg/dL — ABNORMAL HIGH (ref 0.3–1.2)
Total Protein: 5.7 g/dL — ABNORMAL LOW (ref 6.5–8.1)

## 2023-03-15 LAB — GLUCOSE, CAPILLARY
Glucose-Capillary: 200 mg/dL — ABNORMAL HIGH (ref 70–99)
Glucose-Capillary: 252 mg/dL — ABNORMAL HIGH (ref 70–99)
Glucose-Capillary: 255 mg/dL — ABNORMAL HIGH (ref 70–99)
Glucose-Capillary: 212 mg/dL — ABNORMAL HIGH (ref 70–99)

## 2023-03-15 LAB — OSMOLALITY: Osmolality: 279 mosm/kg (ref 275–295)

## 2023-03-15 MED ORDER — POTASSIUM & SODIUM PHOSPHATES 280-160-250 MG PO PACK
2.0000 | PACK | Freq: Once | ORAL | Status: AC
  Administered 2023-03-15: 2 via ORAL
  Filled 2023-03-15: qty 2

## 2023-03-15 MED ORDER — FUROSEMIDE 10 MG/ML IJ SOLN
40.0000 mg | Freq: Two times a day (BID) | INTRAMUSCULAR | Status: AC
  Administered 2023-03-15 – 2023-03-17 (×4): 40 mg via INTRAVENOUS
  Filled 2023-03-15 (×4): qty 4

## 2023-03-15 NOTE — Progress Notes (Signed)
   Subjective: 2 Days Post-Op Procedure(s) (LRB): REVERSE SHOULDER ARTHROPLASTY (Left) Patient reports pain as moderate  Patient is well, and has had no acute complaints or problems Denies any CP, SOB, ABD pain. We will continue therapy today.   Right upper extremity pain well-controlled.  He has a unstable right shoulder dislocation with large Hill-Sachs lesion  Objective: Vital signs in last 24 hours: Temp:  [97.5 F (36.4 C)-99.1 F (37.3 C)] 99 F (37.2 C) (09/01 0425) Pulse Rate:  [84-104] 104 (09/01 0425) Resp:  [17-20] 20 (09/01 0425) BP: (128-142)/(48-56) 142/56 (09/01 0425) SpO2:  [91 %-98 %] 92 % (09/01 0425)  Intake/Output from previous day: 08/31 0701 - 09/01 0700 In: 323.2 [P.O.:120; I.V.:203.2] Out: 3500 [Urine:2650; Drains:850] Intake/Output this shift: No intake/output data recorded.  Recent Labs    03/13/23 0342 03/14/23 0450 03/15/23 0616  HGB 9.6* 9.7* 9.6*   Recent Labs    03/14/23 0450 03/15/23 0616  WBC 60.5* 69.0*  RBC 3.29* 3.29*  HCT 29.9* 29.3*  PLT 408* 418*   Recent Labs    03/13/23 0342 03/14/23 0450  NA 134* 131*  K 4.3 4.7  CL 96* 99  CO2 24 23  BUN 22 16  CREATININE 0.79 0.64  GLUCOSE 269* 177*  CALCIUM 9.2 7.8*   No results for input(s): "LABPT", "INR" in the last 72 hours.  EXAM General - Patient is Alert, Appropriate, and Oriented Left upper extremity - Neurovascular intact Sensation intact distally Intact pulses distally No cellulitis present Compartment soft Dressing - dressing C/D/I and no drainage,  Motor Function - intact, moving foot and toes/left and right upper extremity digits well on exam.   Past Medical History:  Diagnosis Date   Diabetes type 2, controlled (HCC)    Hypothyroid    UTI (urinary tract infection)     Assessment/Plan:   2 Days Post-Op Procedure(s) (LRB): REVERSE SHOULDER ARTHROPLASTY (Left) Principal Problem:   Pneumonia Active Problems:   CAP (community acquired pneumonia)    Sepsis (HCC)   Hyponatremia  Estimated body mass index is 34.26 kg/m as calculated from the following:   Height as of this encounter: 5' 7.5" (1.715 m).   Weight as of this encounter: 100.7 kg. Advance diet Up with therapy Left shoulder postoperative pain moderate, due for pain meds. Continue with current pain regimen Labs and vital signs are stable  Patient with right shoulder posterior dislocation and with displaced greater tuberosity fracture/very large Hill-Sachs lesion that is unstable and likely to lead to repetitive dislocations.  A right reverse total shoulder replacement is scheduled on 03/17/2023.  Patient will stay inpatient until this procedure.  He will be n.p.o. after midnight on 03/16/2023 and we will hold his Lovenox 24 hours in advance of surgery.  DVT Prophylaxis - Lovenox, TED hose, and SCDs    T. Cranston Neighbor, PA-C [facility] 03/15/2023, 7:15 AM

## 2023-03-15 NOTE — Plan of Care (Signed)
  Problem: Activity: Goal: Ability to tolerate increased activity will improve Outcome: Progressing   Problem: Clinical Measurements: Goal: Ability to maintain a body temperature in the normal range will improve Outcome: Progressing   Problem: Respiratory: Goal: Ability to maintain adequate ventilation will improve Outcome: Progressing Goal: Ability to maintain a clear airway will improve Outcome: Progressing   Problem: Education: Goal: Ability to describe self-care measures that may prevent or decrease complications (Diabetes Survival Skills Education) will improve Outcome: Progressing Goal: Individualized Educational Video(s) Outcome: Progressing   Problem: Coping: Goal: Ability to adjust to condition or change in health will improve Outcome: Progressing   Problem: Fluid Volume: Goal: Ability to maintain a balanced intake and output will improve Outcome: Progressing   Problem: Health Behavior/Discharge Planning: Goal: Ability to identify and utilize available resources and services will improve Outcome: Progressing Goal: Ability to manage health-related needs will improve Outcome: Progressing   Problem: Metabolic: Goal: Ability to maintain appropriate glucose levels will improve Outcome: Progressing   Problem: Nutritional: Goal: Maintenance of adequate nutrition will improve Outcome: Progressing Goal: Progress toward achieving an optimal weight will improve Outcome: Progressing   Problem: Skin Integrity: Goal: Risk for impaired skin integrity will decrease Outcome: Progressing   Problem: Tissue Perfusion: Goal: Adequacy of tissue perfusion will improve Outcome: Progressing   Problem: Education: Goal: Knowledge of General Education information will improve Description: Including pain rating scale, medication(s)/side effects and non-pharmacologic comfort measures Outcome: Progressing   Problem: Health Behavior/Discharge Planning: Goal: Ability to manage  health-related needs will improve Outcome: Progressing   Problem: Clinical Measurements: Goal: Ability to maintain clinical measurements within normal limits will improve Outcome: Progressing Goal: Will remain free from infection Outcome: Progressing Goal: Diagnostic test results will improve Outcome: Progressing Goal: Respiratory complications will improve Outcome: Progressing Goal: Cardiovascular complication will be avoided Outcome: Progressing   Problem: Activity: Goal: Risk for activity intolerance will decrease Outcome: Progressing   Problem: Nutrition: Goal: Adequate nutrition will be maintained Outcome: Progressing   Problem: Coping: Goal: Level of anxiety will decrease Outcome: Progressing   Problem: Elimination: Goal: Will not experience complications related to bowel motility Outcome: Progressing Goal: Will not experience complications related to urinary retention Outcome: Progressing   Problem: Pain Managment: Goal: General experience of comfort will improve Outcome: Progressing   Problem: Safety: Goal: Ability to remain free from injury will improve Outcome: Progressing   Problem: Skin Integrity: Goal: Risk for impaired skin integrity will decrease Outcome: Progressing   Problem: Education: Goal: Knowledge of the prescribed therapeutic regimen will improve Outcome: Progressing Goal: Understanding of activity limitations/precautions following surgery will improve Outcome: Progressing Goal: Individualized Educational Video(s) Outcome: Progressing   Problem: Activity: Goal: Ability to tolerate increased activity will improve Outcome: Progressing   Problem: Pain Management: Goal: Pain level will decrease with appropriate interventions Outcome: Progressing

## 2023-03-15 NOTE — Consult Note (Signed)
PHARMACY CONSULT NOTE - ELECTROLYTES  Pharmacy Consult for Electrolyte Monitoring and Replacement   Recent Labs: Potassium (mmol/L)  Date Value  03/14/2023 4.7   Magnesium (mg/dL)  Date Value  08/65/7846 2.1   Calcium (mg/dL)  Date Value  96/29/5284 7.8 (L)   Albumin (g/dL)  Date Value  13/24/4010 2.8 (L)   Phosphorus (mg/dL)  Date Value  27/25/3664 3.2   Sodium (mmol/L)  Date Value  03/14/2023 131 (L)   Corrected Calcium: 8.76  Height: 5' 7.5" (171.5 cm) Weight: 100.7 kg (222 lb) IBW/kg (Calculated) : 67.25 Estimated Creatinine Clearance: 84.1 mL/min (by C-G formula based on SCr of 0.64 mg/dL).  Assessment  Philip Robbins is a 82 y.o. male presenting with pneumonia and hyponatremia. PMH significant for CLL, BPH, HTN, DM, hypothyroidism, HLD. Pharmacy has been consulted to monitor and replace electrolytes.  Diet: Heart healthy MIVF: NS@75ml /hr Pertinent medications: N/A  Goal of Therapy: Electrolytes within normal limits  Plan:  No replacement currently indicated Follow-up electrolytes with AM labs tomorrow  Thank you for allowing pharmacy to be a part of this patient's care.  Bettey Costa, PharmD Clinical Pharmacist 03/15/2023 7:39 AM

## 2023-03-15 NOTE — Progress Notes (Signed)
Physical Therapy Treatment Patient Details Name: Philip Robbins MRN: [id number] DOB: 1941-03-31 Today's Date: 03/15/2023   History of Present Illness Pt is an 82 y.o. male presenting to hospital 03/08/23 with c/o AMS and hypoxia; wife concerned pt was having a seizure.  Imaging showing: Comminuted fracture of left humeral head and neck (Posterior subluxation of the humeral head, with anterior displacement of the humeral neck); mildly displaced, comminuted fracture of the right humeral head and neck.  Per notes (prior to hospitalization) "Patient went for a left neck lymph node biopsy on Friday. Afterwards, patient started to have upper respiratory infection symptoms. Then next day, he started having urinary infection symptoms. Was taken to urgent care where he was diagnosed with urinary tract infection and started on levofloxacin. Patient last night became hypoxic on room air, febrile and possible seizure".  Pt admitted with severe sepsis, acute hyponatremia, question seizure, aspiration PNA, UTI.  PMH includes UTI, CLL under surveillance, BPH, htn, IIDM, hypothyroidism, HLD, chronic leukocytosis.    PT Comments  The pt presents in good spirits. His daughters are present for today's session. He and family are educated on the importance of removing the polar care for skin integrity. Upon removal during today's session the pt presents with redness and markings in the skin. RN also notified, no skin tear noted. The pt participates in ambulation this session but following short distance gait and toileting the patient reports dizziness and needs Min A for balance and assistance to return to sitting at EOB. He also requires increased assistance to return to supine. At this time the current plan continues to be appropriate for this patient.    If plan is discharge home, recommend the following: A little help with walking and/or transfers;A lot of help with bathing/dressing/bathroom;Assistance with  feeding;Assistance with cooking/housework;Direct supervision/assist for medications management;Direct supervision/assist for financial management;Assist for transportation;Help with stairs or ramp for entrance   Can travel by private vehicle     Yes  Equipment Recommendations       Recommendations for Other Services       Precautions / Restrictions Precautions Precautions: Shoulder Type of Shoulder Precautions: LUE: PROM 90 degrees shoulder flexion, 30 degree ER, elbow/wrist ROM excs, pendulums Shoulder Interventions: Shoulder sling/immobilizer;At all times;Shoulder abduction pillow (Shoulder abduction pillow on the L, s/p TSA) Precaution Comments: pt now s/p L TSA on 8/30 Required Braces or Orthoses: Sling Restrictions Weight Bearing Restrictions: Yes RUE Weight Bearing: Non weight bearing LUE Weight Bearing: Non weight bearing Other Position/Activity Restrictions: L TSA on 8/30, plan for R TSA on 9/3     Mobility  Bed Mobility Overal bed mobility: Needs Assistance Bed Mobility: Supine to Sit     Supine to sit: Mod assist, HOB elevated Sit to supine: Max assist   General bed mobility comments: Pt requiring more assistance for return to bed 2/2 increased dizziness    Transfers Overall transfer level: Needs assistance Equipment used: 1 person hand held assist Transfers: Sit to/from Stand Sit to Stand: Contact guard assist                Ambulation/Gait Ambulation/Gait assistance: Contact guard assist, Min assist Gait Distance (Feet): 40 Feet Assistive device: 1 person hand held assist             Stairs             Wheelchair Mobility     Tilt Bed    Modified Rankin (Stroke Patients Only)       Balance Overall  balance assessment: Needs assistance Sitting-balance support: Feet supported Sitting balance-Leahy Scale: Good     Standing balance support: No upper extremity supported Standing balance-Leahy Scale: Fair                               Cognition Arousal: Alert Behavior During Therapy: WFL for tasks assessed/performed                                            Exercises      General Comments        Pertinent Vitals/Pain Pain Assessment Pain Assessment: 0-10 Pain Score: 5  Pain Intervention(s): Monitored during session, Limited activity within patient's tolerance, RN gave pain meds during session    Home Living                          Prior Function            PT Goals (current goals can now be found in the care plan section) Acute Rehab PT Goals PT Goal Formulation: With patient Time For Goal Achievement: 03/24/23 Potential to Achieve Goals: Good Progress towards PT goals: Progressing toward goals    Frequency    Min 1X/week      PT Plan      Co-evaluation              AM-PAC PT "6 Clicks" Mobility   Outcome Measure  Help needed turning from your back to your side while in a flat bed without using bedrails?: A Lot Help needed moving from lying on your back to sitting on the side of a flat bed without using bedrails?: A Lot Help needed moving to and from a bed to a chair (including a wheelchair)?: A Little Help needed standing up from a chair using your arms (e.g., wheelchair or bedside chair)?: A Little Help needed to walk in hospital room?: A Little Help needed climbing 3-5 steps with a railing? : A Little 6 Click Score: 16    End of Session   Activity Tolerance: Patient limited by pain Patient left: in bed;with family/visitor present;with bed alarm set;with call bell/phone within reach   PT Visit Diagnosis: Unsteadiness on feet (R26.81);Other abnormalities of gait and mobility (R26.89);Muscle weakness (generalized) (M62.81);Pain Pain - part of body: Shoulder     Time: 1610-9604 PT Time Calculation (min) (ACUTE ONLY): 40 min  Charges:    $Therapeutic Activity: 38-52 mins PT General Charges $$ ACUTE PT VISIT: 1  Visit                     11:35 AM, 03/15/23 Camila Maita A. Mordecai Maes PT, DPT Physical Therapist - [facility]    Lis Savitt A Joniya Boberg 03/15/2023, 11:32 AM

## 2023-03-15 NOTE — Progress Notes (Signed)
[facility] Progress Note  Patient: Philip Robbins    YQM:[id number]  DOA: 03/08/2023     Date of Service: the patient was seen and examined on 03/15/2023  Chief Complaint  Patient presents with   Fever   Brief hospital course:  PEGGY MOHIUDDIN is a 82 y.o. male with medical history significant of CLL under surveillance, BPH, HTN, IIDM, hypothyroidism, HLD, chronic leukocytosis presented with altered mentations hypoxia.   His symptoms started last Friday, patient went to have a left neck lymph node biopsy, after the procedure patient developed URI-like symptoms with runny nose sore throat, yesterday, patient developed strong burning sensation in the urine and dark-colored urine and went to see urgent care was diagnosed with UTI and started on antibiotics.  Patient continued to feel malaise generalized weakness, nauseous but no vomiting and no abdominal pain overnight and this morning patient was found in the bed unresponsive with large vomitus on his bed and foams in his mouth.  Family called EMS, EMS arrived and found patient hypoxic O2 saturation 95% on room air and patient was placed on 15 L NRB and sent to ED.  Fever 101.8.  Wife also witnessed patient had some twitching movement of his arms and suspect whether he had seizure.  At baseline patient has BPH appears to be poorly controlled, he reported Brueggen screams and frequent nocturnal urination but no history of UTI before.   ED Course: Fever 101.2, tachycardia hypotensive oxygen saturation 91% on 6 L.  Chest x-ray showed bilateral infiltrates indicating for pneumonia.  WBC 35.1.  Hemoglobin 12, sodium 118, K4.1, bicarb 12, creatinine 1.1, troponin 64> 99, AST 67, ALT 46   Patient was given 3 L IV bolus and started on ceftriaxone, azithromycin Assessment and Plan:  Sepsis, severe, Resolved -Evidenced by new onset of hypoxia, elevated temperature and elevated lactic acid with signs of endorgan damage of acute encephalopathy and source  of infection likely aspiration pneumonia and UTI. s/p IVF given as per protocol.  -Continue ceftriaxone and azithromycin to cover pneumonia and UTI for 5 days   Acute hypoxic respiratory failure secondary to pneumonia and COPD/asthma exacerbation  Patient does not have diagnosis of COPD or asthma Patient was wheezing, resolved on 8/28 Continue above antibiotics for pneumonia 8/27  prednisone 40 mg p.o. daily for 3 days Started Breo Ellipta inhaler and DuoNeb TID 8/28 respiratory failure resolved, patient saturating well on room air. 8/29 dyspnea on exertion, hypoxia on ambulation, continue supplemental O2 inhalation as needed 8/30 started Lasix 40 mg IV twice daily for 4 days   Aspiration pneumonia -Likely secondary to repeated vomiting at night, management as above  UTI -Likely secondary to uncontrolled BPH, continue antibiotics -Check PVR, outpatient follow-up with urology for urodynamic study -Continue Flomax  acute hypotonic hyponatremia -Hypovolemic, likely secondary to acute GI loss from repeated vomiting overnight and sepsis Serum osmolality 257 low, TSH 2.6 within normal range Nephro consulted, s/p saline 3% IVF discontinued on 8/27 Na 118------134--131--128  Sodium level fluctuating, continue to monitor. Continue fluid restriction 1.5 L/day   # Hypophosphatemia, Phos repleted.  Resolved # Hypomagnesemia, mag repleted.  Resolved Monitor electrolytes daily.  And replete as needed  # B/L Humral fracture No h/o fall, possible trauma while transportation as pt was combative and he was held strongly by EMS.  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  Repeat sodium level and likely will start hypertonic saline, consult nephrology. -Seizure precaution.   Troponin elevation -No chest pains, EKG showed no acute ST changes and the poor R wave progression appears to be chronic. -Etiology likely related to sepsis and acute hypoxia and demanding ischemia.  Treat sepsis, repeat troponin this evening.   CLL with chronic leukocytosis -Peripheral smear is being reviewed to rule out acute transformation -Management of sepsis as above 8/29 WBC count is significantly elevated, could be due to steroids.  Thrombocytosis, continue to monitor Monitor CBC daily  HTN Blood pressure improved 8/27 resumed lisinopril 20 mg p.o. 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 weekly, follow with PCP to repeat vitamin D level after 3 to 6 months.  Vitamin B12 deficiency: Started vitamin B12 1000 mcg oral supplement, avoided IM injection due to bilateral humeral fractures Follow-up PCP to repeat vitamin B12 level after 3 to 6 months.  Iron deficiency, transferrin saturation 6%, started oral iron supplement.  Avoided IV iron due to active infection. Follow-up with PCP to repeat iron profile after 3 to 6 months Folic acid level 8.3, at lower end, started oral supplement for 3 months.   Body mass index is 34.26 kg/m.  Interventions:  Diet: Heart healthy diet DVT Prophylaxis: Subcutaneous Lovenox   Advance goals of care discussion: Full code  Family Communication: family was present at bedside, at the time of interview.  The pt provided permission to discuss medical plan with the family. Opportunity was given to  ask question and all questions were answered satisfactorily.   Disposition:  Pt is from Home, admitted with hyponatremia, pneumonia, respiratory failure, UTI, b/l.  Humerus fracture, still has resp failure and low sodium, which precludes a safe discharge. Discharge to SNF , when medically stable.  Subjective: No significant overnight events.  Patient stated that his physical debility is getting worse, having more difficulty working with PT.  Before he was feeling more stronger and was able to walk with PT.  Shoulder pain is well-controlled, no any other complaints. Patient still has shortness of breath and requires total oxygen via nasal cannula.  Saturating 93% on 2 L oxygen.   Physical Exam: General: NAD, lying comfortably Appear in no distress, affect appropriate Eyes: PERRLA ENT: Oral Mucosa Clear, moist  Neck: no JVD,  Cardiovascular: S1 and S2 Present, no Murmur,  Respiratory: Good air entry bilaterally, b/l  crackles and no wheezing. Abdomen: Bowel Sound present, Soft and no tenderness,  Skin: no rashes Extremities: mild Pedal edema, no calf tenderness.  Bilateral shoulders slings intact. S/p Left shoulder surgery. Neurologic: without any new focal findings Gait not checked due to patient safety concerns  Vitals:   03/14/23 2355 03/15/23 0425 03/15/23 0748 03/15/23 1243  BP: (!) 137/48 (!) 142/56 (!) 142/50 (!) 116/45  Pulse: 96 (!) 104 (!) 101 99  Resp: 20 20 16 17   Temp: 97.9 F (36.6 C) 99 F (37.2 C) 98.8 F (37.1 C) 99.5 F (37.5 C)  TempSrc: Oral Oral    SpO2: 93% 92% 94% 92%  Weight:  Height:        Intake/Output Summary (Last 24 hours) at 03/15/2023 1515 Last data filed at 03/15/2023 0944 Gross per 24 hour  Intake 323.22 ml  Output 3050 ml  Net -2726.78 ml   Filed Weights   03/10/23 0400 03/12/23 0500 03/13/23 0913  Weight: 103.3 kg 107 kg 100.7 kg    Data Reviewed: I have personally reviewed and interpreted daily labs, tele strips, imagings as  discussed above. I reviewed all nursing notes, pharmacy notes, vitals, pertinent old records I have discussed plan of care as described above with RN and patient/family.  CBC: Recent Labs  Lab 03/11/23 0418 03/12/23 0450 03/13/23 0342 03/14/23 0450 03/15/23 0616  WBC 31.5* 52.3* 54.1* 60.5* 69.0*  HGB 9.7* 9.5* 9.6* 9.7* 9.6*  HCT 28.8* 28.2* 28.8* 29.9* 29.3*  MCV 87.3 87.9 87.5 90.9 89.1  PLT 267 317 363 408* 418*   Basic Metabolic Panel: Recent Labs  Lab 03/11/23 0418 03/12/23 0450 03/13/23 0342 03/14/23 0450 03/15/23 0616  NA 133* 131* 134* 131* 128*  K 4.2 3.8 4.3 4.7 4.3  CL 104 98 96* 99 95*  CO2 23 24 24 23 26   GLUCOSE 203* 195* 269* 177* 202*  BUN 12 17 22 16 13   CREATININE 0.63 0.72 0.79 0.64 0.73  CALCIUM 8.4* 8.5* 9.2 7.8* 8.1*  MG 1.9 1.8 1.8 2.1 1.7  PHOS 3.3 3.0 3.4 3.2 2.4*    Studies: No results found.  Scheduled Meds:  vitamin C  500 mg Oral Daily   Chlorhexidine Gluconate Cloth  6 each Topical Q0600   vitamin B-12  1,000 mcg Oral Daily   diclofenac Sodium  2 g Topical QID   docusate sodium  100 mg Oral BID   fluticasone furoate-vilanterol  1 puff Inhalation Daily   folic acid  1 mg Oral Daily   furosemide  40 mg Intravenous BID   guaiFENesin  600 mg Oral BID   insulin aspart  0-15 Units Subcutaneous TID WC   insulin aspart  0-5 Units Subcutaneous QHS   insulin aspart  4 Units Subcutaneous TID WC   iron polysaccharides  150 mg Oral Daily   levothyroxine  50 mcg Oral Q0600   lisinopril  20 mg Oral Daily   multivitamin with minerals  1 tablet Oral Daily   pantoprazole  40 mg Oral Daily   Ensure Max Protein  11 oz Oral BID   tamsulosin  0.4 mg Oral QPC supper   Vitamin D (Ergocalciferol)  50,000 Units Oral Q7 days   Continuous Infusions:  tranexamic acid      PRN Meds: alum & mag hydroxide-simeth, bisacodyl, chlorpheniramine-HYDROcodone, hydrALAZINE, HYDROmorphone (DILAUDID) injection, ipratropium-albuterol, menthol-cetylpyridinium  **OR** phenol, metoCLOPramide **OR** metoCLOPramide (REGLAN) injection, ondansetron **OR** ondansetron (ZOFRAN) IV, oxyCODONE, oxyCODONE, senna-docusate  Time spent: 40 minutes  Author: Gillis Santa. MD [facility] 03/15/2023 3:15 PM  To reach On-call, see care teams to locate the attending and reach out to them via [email or web address]. If 7PM-7AM, please contact night-coverage If you still have difficulty reaching the attending provider, please page the [facility] (Director on Call) for [facility] on amion for assistance.

## 2023-03-16 DIAGNOSIS — J189 Pneumonia, unspecified organism: Secondary | ICD-10-CM | POA: Diagnosis not present

## 2023-03-16 LAB — MAGNESIUM: Magnesium: 1.7 mg/dL (ref 1.7–2.4)

## 2023-03-16 LAB — GLUCOSE, CAPILLARY
Glucose-Capillary: 217 mg/dL — ABNORMAL HIGH (ref 70–99)
Glucose-Capillary: 263 mg/dL — ABNORMAL HIGH (ref 70–99)
Glucose-Capillary: 215 mg/dL — ABNORMAL HIGH (ref 70–99)
Glucose-Capillary: 205 mg/dL — ABNORMAL HIGH (ref 70–99)

## 2023-03-16 LAB — CBC
HCT: 27.4 % — ABNORMAL LOW (ref 39.0–52.0)
Hemoglobin: 9 g/dL — ABNORMAL LOW (ref 13.0–17.0)
MCH: 28.8 pg (ref 26.0–34.0)
MCHC: 32.8 g/dL (ref 30.0–36.0)
MCV: 87.8 fL (ref 80.0–100.0)
Platelets: 397 10*3/uL (ref 150–400)
RBC: 3.12 MIL/uL — ABNORMAL LOW (ref 4.22–5.81)
RDW: 15.4 % (ref 11.5–15.5)
WBC: 61.9 10*3/uL (ref 4.0–10.5)
nRBC: 0 % (ref 0.0–0.2)

## 2023-03-16 LAB — BASIC METABOLIC PANEL
Anion gap: 6 (ref 5–15)
BUN: 16 mg/dL (ref 8–23)
CO2: 27 mmol/L (ref 22–32)
Calcium: 8.1 mg/dL — ABNORMAL LOW (ref 8.9–10.3)
Chloride: 97 mmol/L — ABNORMAL LOW (ref 98–111)
Creatinine, Ser: 0.68 mg/dL (ref 0.61–1.24)
GFR, Estimated: >60 mL/min (ref >60)
Glucose, Bld: 185 mg/dL — ABNORMAL HIGH (ref 70–99)
Potassium: 3.8 mmol/L (ref 3.5–5.1)
Sodium: 130 mmol/L — ABNORMAL LOW (ref 135–145)

## 2023-03-16 LAB — HEPATIC FUNCTION PANEL
ALT: 58 U/L — ABNORMAL HIGH (ref 0–44)
AST: 28 U/L (ref 15–41)
Albumin: 2.2 g/dL — ABNORMAL LOW (ref 3.5–5.0)
Alkaline Phosphatase: 57 U/L (ref 38–126)
Bilirubin, Direct: 0.3 mg/dL — ABNORMAL HIGH (ref 0.0–0.2)
Indirect Bilirubin: 0.5 mg/dL (ref 0.3–0.9)
Total Bilirubin: 0.8 mg/dL (ref 0.3–1.2)
Total Protein: 5.3 g/dL — ABNORMAL LOW (ref 6.5–8.1)

## 2023-03-16 LAB — PHOSPHORUS: Phosphorus: 2.8 mg/dL (ref 2.5–4.6)

## 2023-03-16 MED ORDER — MAGNESIUM SULFATE 2 GM/50ML IV SOLN
2.0000 g | Freq: Once | INTRAVENOUS | Status: AC
  Administered 2023-03-16: 2 g via INTRAVENOUS
  Filled 2023-03-16: qty 50

## 2023-03-16 MED ORDER — CEFAZOLIN SODIUM-DEXTROSE 2-4 GM/100ML-% IV SOLN
2.0000 g | INTRAVENOUS | Status: DC
  Filled 2023-03-16: qty 100

## 2023-03-16 MED ORDER — CEFAZOLIN SODIUM-DEXTROSE 2-4 GM/100ML-% IV SOLN
2.0000 g | INTRAVENOUS | Status: AC
  Administered 2023-03-17: 2 g via INTRAVENOUS

## 2023-03-16 NOTE — Plan of Care (Signed)
  Problem: Activity: Goal: Ability to tolerate increased activity will improve Outcome: Progressing   Problem: Clinical Measurements: Goal: Ability to maintain a body temperature in the normal range will improve Outcome: Progressing   Problem: Respiratory: Goal: Ability to maintain adequate ventilation will improve Outcome: Progressing Goal: Ability to maintain a clear airway will improve Outcome: Progressing   Problem: Education: Goal: Ability to describe self-care measures that may prevent or decrease complications (Diabetes Survival Skills Education) will improve Outcome: Progressing Goal: Individualized Educational Video(s) Outcome: Progressing   Problem: Coping: Goal: Ability to adjust to condition or change in health will improve Outcome: Progressing   Problem: Fluid Volume: Goal: Ability to maintain a balanced intake and output will improve Outcome: Progressing   Problem: Health Behavior/Discharge Planning: Goal: Ability to identify and utilize available resources and services will improve Outcome: Progressing Goal: Ability to manage health-related needs will improve Outcome: Progressing   Problem: Metabolic: Goal: Ability to maintain appropriate glucose levels will improve Outcome: Progressing   Problem: Nutritional: Goal: Maintenance of adequate nutrition will improve Outcome: Progressing Goal: Progress toward achieving an optimal weight will improve Outcome: Progressing   Problem: Skin Integrity: Goal: Risk for impaired skin integrity will decrease Outcome: Progressing   Problem: Tissue Perfusion: Goal: Adequacy of tissue perfusion will improve Outcome: Progressing   Problem: Education: Goal: Knowledge of General Education information will improve Description: Including pain rating scale, medication(s)/side effects and non-pharmacologic comfort measures Outcome: Progressing   Problem: Health Behavior/Discharge Planning: Goal: Ability to manage  health-related needs will improve Outcome: Progressing   Problem: Clinical Measurements: Goal: Ability to maintain clinical measurements within normal limits will improve Outcome: Progressing Goal: Will remain free from infection Outcome: Progressing Goal: Diagnostic test results will improve Outcome: Progressing Goal: Respiratory complications will improve Outcome: Progressing Goal: Cardiovascular complication will be avoided Outcome: Progressing   Problem: Activity: Goal: Risk for activity intolerance will decrease Outcome: Progressing   Problem: Nutrition: Goal: Adequate nutrition will be maintained Outcome: Progressing   Problem: Coping: Goal: Level of anxiety will decrease Outcome: Progressing   Problem: Elimination: Goal: Will not experience complications related to bowel motility Outcome: Progressing Goal: Will not experience complications related to urinary retention Outcome: Progressing   Problem: Pain Managment: Goal: General experience of comfort will improve Outcome: Progressing   Problem: Safety: Goal: Ability to remain free from injury will improve Outcome: Progressing   Problem: Skin Integrity: Goal: Risk for impaired skin integrity will decrease Outcome: Progressing   Problem: Education: Goal: Knowledge of the prescribed therapeutic regimen will improve Outcome: Progressing Goal: Understanding of activity limitations/precautions following surgery will improve Outcome: Progressing Goal: Individualized Educational Video(s) Outcome: Progressing   Problem: Activity: Goal: Ability to tolerate increased activity will improve Outcome: Progressing   Problem: Pain Management: Goal: Pain level will decrease with appropriate interventions Outcome: Progressing

## 2023-03-16 NOTE — Progress Notes (Signed)
[facility] Progress Note  Patient: Philip Robbins    UJW:[id number]  DOA: 03/08/2023     Date of Service: the patient was seen and examined on 03/16/2023  Chief Complaint  Patient presents with   Fever   Brief hospital course:  Philip Robbins is a 82 y.o. male with medical history significant of CLL under surveillance, BPH, HTN, IIDM, hypothyroidism, HLD, chronic leukocytosis presented with altered mentations hypoxia.   His symptoms started last Friday, patient went to have a left neck lymph node biopsy, after the procedure patient developed URI-like symptoms with runny nose sore throat, yesterday, patient developed strong burning sensation in the urine and dark-colored urine and went to see urgent care was diagnosed with UTI and started on antibiotics.  Patient continued to feel malaise generalized weakness, nauseous but no vomiting and no abdominal pain overnight and this morning patient was found in the bed unresponsive with large vomitus on his bed and foams in his mouth.  Family called EMS, EMS arrived and found patient hypoxic O2 saturation 95% on room air and patient was placed on 15 L NRB and sent to ED.  Fever 101.8.  Wife also witnessed patient had some twitching movement of his arms and suspect whether he had seizure.  At baseline patient has BPH appears to be poorly controlled, he reported Brueggen screams and frequent nocturnal urination but no history of UTI before.   ED Course: Fever 101.2, tachycardia hypotensive oxygen saturation 91% on 6 L.  Chest x-ray showed bilateral infiltrates indicating for pneumonia.  WBC 35.1.  Hemoglobin 12, sodium 118, K4.1, bicarb 12, creatinine 1.1, troponin 64> 99, AST 67, ALT 46   Patient was given 3 L IV bolus and started on ceftriaxone, azithromycin Assessment and Plan:  Sepsis, severe, Resolved -Evidenced by new onset of hypoxia, elevated temperature and elevated lactic acid with signs of endorgan damage of acute encephalopathy and source  of infection likely aspiration pneumonia and UTI. s/p IVF given as per protocol.  -s/p ceftriaxone and azithromycin to cover pneumonia and UTI for 5 days.  Completed course.  Acute hypoxic respiratory failure secondary to pneumonia and COPD/asthma exacerbation  Patient does not have diagnosis of COPD or asthma Patient was wheezing, resolved on 8/28 Continue above antibiotics for pneumonia 8/27  prednisone 40 mg p.o. daily for 3 days Started Breo Ellipta inhaler and DuoNeb TID 8/28 respiratory failure resolved, patient saturating well on room air. 8/29 dyspnea on exertion, hypoxia on ambulation, continue supplemental O2 inhalation as needed 8/30 started Lasix 40 mg IV twice daily for 4 days   Aspiration pneumonia -Likely secondary to repeated vomiting at night, managed as above  UTI -Likely secondary to uncontrolled BPH, s/p Antibiotics -outpatient follow-up with urology for urodynamic study -Continue Flomax  acute hypotonic hyponatremia -Hypovolemic, likely secondary to acute GI loss from repeated vomiting overnight and sepsis Serum osmolality 257 low, TSH 2.6 within normal range Nephro consulted, s/p saline 3% IVF discontinued on 8/27 Na 118------134--131--128 --130 Sodium level fluctuating, continue to monitor. Continue fluid restriction 1.5 L/day   # Hypophosphatemia, Phos repleted.  Resolved # Hypomagnesemia, mag repleted.  Resolved Monitor electrolytes daily.  And replete as needed  # B/L Humral fracture No h/o fall, possible trauma while transportation as pt was combative and he was held strongly by EMS.  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  Repeat sodium level and likely will start hypertonic saline, consult nephrology. -Seizure precaution.   Troponin elevation -No chest pains, EKG showed no acute ST changes and the poor R wave progression appears to be chronic. -Etiology likely related to sepsis and acute hypoxia and demanding ischemia.  Treat sepsis, repeat troponin this evening.   CLL with chronic leukocytosis -Peripheral smear is being reviewed to rule out acute transformation -Management of sepsis as above 8/29 WBC count is significantly elevated, could be due to steroids.  Thrombocytosis, continue to monitor Monitor CBC daily  HTN Blood pressure improved 8/27 resumed lisinopril 20 mg p.o. 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 weekly, follow with PCP to repeat vitamin D level after 3 to 6 months.  Vitamin B12 deficiency: Started vitamin B12 1000 mcg oral supplement, avoided IM injection due to bilateral humeral fractures Follow-up PCP to repeat vitamin B12 level after 3 to 6 months.  Iron deficiency, transferrin saturation 6%, started oral iron supplement.  Avoided IV iron due to active infection. Follow-up with PCP to repeat iron profile after 3 to 6 months Folic acid level 8.3, at lower end, started oral supplement for 3 months.   Body mass index is 34.26 kg/m.  Interventions:  Diet: Heart healthy diet DVT Prophylaxis: Subcutaneous Lovenox   Advance goals of care discussion: Full code  Family Communication: family was present at bedside, at the time of interview.  The pt provided permission to discuss medical plan with the family. Opportunity was given to  ask question and all questions were answered satisfactorily.   Disposition:  Pt is from Home, admitted with hyponatremia, pneumonia, respiratory failure, UTI, b/l.  Humerus fracture, still has resp failure and low sodium, which precludes a safe discharge. Discharge to SNF , when medically stable.  Subjective: No significant overnight events.  Patient's breathing is stable, pain is well-controlled.  Patient is scheduled for right shoulder surgery, verbalized understanding. Denied any chest pain or palpitations, no any other complaints.   Physical Exam: General: NAD, lying comfortably Appear in no distress, affect appropriate Eyes: PERRLA ENT: Oral Mucosa Clear, moist  Neck: no JVD,  Cardiovascular: S1 and S2 Present, no Murmur,  Respiratory: Good air entry bilaterally, b/l  crackles and no wheezing. Abdomen: Bowel Sound present, Soft and no tenderness,  Skin: no rashes Extremities: mild Pedal edema, no calf tenderness.  Bilateral shoulders slings intact. S/p Left shoulder surgery. Neurologic: without any new focal findings Gait not checked due to patient safety concerns  Vitals:   03/15/23 2048 03/16/23 0018 03/16/23 0842 03/16/23 1117  BP: (!) 126/42 (!) 129/45 (!) 122/49 (!) 127/44  Pulse: 93 91 94 100  Resp: (!) 22 17 17 17   Temp: 98.6 F (37 C) 98.4 F (36.9 C) 98 F (36.7 C) 98.5 F (36.9 C)  TempSrc: Oral Oral    SpO2: 92% 94% 93% 92%  Weight:      Height:        Intake/Output Summary (Last 24 hours) at 03/16/2023 1509 Last data filed at 03/16/2023 0710 Gross per 24 hour  Intake  960 ml  Output 450 ml  Net 510 ml   Filed Weights   03/10/23 0400 03/12/23 0500 03/13/23 0913  Weight: 103.3 kg 107 kg 100.7 kg    Data Reviewed: I have personally reviewed and interpreted daily labs, tele strips, imagings as discussed above. I reviewed all nursing notes, pharmacy notes, vitals, pertinent old records I have discussed plan of care as described above with RN and  patient/family.  CBC: Recent Labs  Lab 03/12/23 0450 03/13/23 0342 03/14/23 0450 03/15/23 0616 03/16/23 0437  WBC 52.3* 54.1* 60.5* 69.0* 61.9*  HGB 9.5* 9.6* 9.7* 9.6* 9.0*  HCT 28.2* 28.8* 29.9* 29.3* 27.4*  MCV 87.9 87.5 90.9 89.1 87.8  PLT 317 363 408* 418* 397   Basic Metabolic Panel: Recent Labs  Lab 03/12/23 0450 03/13/23 0342 03/14/23 0450 03/15/23 0616 03/16/23 0437  NA 131* 134* 131* 128* 130*  K 3.8 4.3 4.7 4.3 3.8  CL 98 96* 99 95* 97*  CO2 24 24 23 26 27   GLUCOSE 195* 269* 177* 202* 185*  BUN 17 22 16 13 16   CREATININE 0.72 0.79 0.64 0.73 0.68  CALCIUM 8.5* 9.2 7.8* 8.1* 8.1*  MG 1.8 1.8 2.1 1.7 1.7  PHOS 3.0 3.4 3.2 2.4* 2.8    Studies: No results found.  Scheduled Meds:  vitamin C  500 mg Oral Daily   Chlorhexidine Gluconate Cloth  6 each Topical Q0600   vitamin B-12  1,000 mcg Oral Daily   diclofenac Sodium  2 g Topical QID   docusate sodium  100 mg Oral BID   fluticasone furoate-vilanterol  1 puff Inhalation Daily   folic acid  1 mg Oral Daily   furosemide  40 mg Intravenous BID   guaiFENesin  600 mg Oral BID   insulin aspart  0-15 Units Subcutaneous TID WC   insulin aspart  0-5 Units Subcutaneous QHS   insulin aspart  4 Units Subcutaneous TID WC   iron polysaccharides  150 mg Oral Daily   levothyroxine  50 mcg Oral Q0600   lisinopril  20 mg Oral Daily   multivitamin with minerals  1 tablet Oral Daily   pantoprazole  40 mg Oral Daily   Ensure Max Protein  11 oz Oral BID   tamsulosin  0.4 mg Oral QPC supper   Vitamin D (Ergocalciferol)  50,000 Units Oral Q7 days   Continuous Infusions:  [START ON 03/17/2023]  ceFAZolin (ANCEF) IV     tranexamic acid      PRN Meds: alum & mag hydroxide-simeth, bisacodyl, chlorpheniramine-HYDROcodone, hydrALAZINE, HYDROmorphone (DILAUDID) injection, ipratropium-albuterol, menthol-cetylpyridinium **OR** phenol, metoCLOPramide **OR** metoCLOPramide (REGLAN) injection, ondansetron **OR** ondansetron (ZOFRAN)  IV, oxyCODONE, oxyCODONE, senna-docusate  Time spent: 40 minutes  Author: Gillis Santa. MD [facility] 03/16/2023 3:09 PM  To reach On-call, see care teams to locate the attending and reach out to them via [email or web address]. If 7PM-7AM, please contact night-coverage If you still have difficulty reaching the attending provider, please page the [facility] (Director on Call) for [facility] on amion for assistance.

## 2023-03-16 NOTE — Plan of Care (Signed)
  Problem: Activity: Goal: Ability to tolerate increased activity will improve Outcome: Progressing   Problem: Clinical Measurements: Goal: Ability to maintain a body temperature in the normal range will improve Outcome: Progressing    Problem: Clinical Measurements: Goal: Ability to maintain clinical measurements within normal limits will improve Outcome: Progressing   Problem: Clinical Measurements: Goal: Will remain free from infection Outcome: Progressing   Problem: Clinical Measurements: Goal: Respiratory complications will improve Outcome: Progressing   Problem: Clinical Measurements: Goal: Cardiovascular complication will be avoided Outcome: Progressing   Problem: Activity: Goal: Risk for activity intolerance will decrease Outcome: Progressing   Problem: Pain Managment: Goal: General experience of comfort will improve Outcome: Progressing   Problem: Skin Integrity: Goal: Risk for impaired skin integrity will decrease Outcome: Progressing   Problem: Safety: Goal: Ability to remain free from injury will improve Outcome: Progressing   Problem: Education: Goal: Knowledge of the prescribed therapeutic regimen will improve Outcome: Progressing

## 2023-03-16 NOTE — TOC Progression Note (Signed)
Transition of Care [facility]) - Progression Note    Patient Details  Name: Philip Robbins MRN: [id number] Date of Birth: [birth year]  Transition of Care [facility] CM/SW Contact  Truddie Hidden, RN Phone Number: 03/16/2023, 12:23 PM  Clinical Narrative:    Spoke with patient at bedisde to give bed offer for [facility]. He is agreeable.Patient states he is grateful for the care from all of the staff here at [facility].    Expected Discharge Plan: Skilled Nursing Facility Barriers to Discharge: Continued Medical Work up  Expected Discharge Plan and Services     Post Acute Care Choice: Home Health Living arrangements for the past 2 months: Single Family Home                           HH Arranged: PT HH Agency: [facility] Date [facility] Agency Contacted: 03/11/23   Representative spoke with at [facility] Agency: [place]   Social Determinants of Health (SDOH) Interventions SDOH Screenings   Food Insecurity: No Food Insecurity (03/10/2023)  Housing: Low Risk  (03/10/2023)  Transportation Needs: No Transportation Needs (03/10/2023)  Utilities: Not At Risk (03/10/2023)  Tobacco Use: Low Risk  (03/13/2023)    Readmission Risk Interventions     No data to display

## 2023-03-16 NOTE — Care Management Important Message (Signed)
Important Message  Patient Details  Name: Philip Robbins MRN: [id number] Date of Birth: [birth year]   Medicare Important Message Given:  Yes     [name] 03/16/2023, 12:42 PM

## 2023-03-16 NOTE — Consult Note (Signed)
PHARMACY CONSULT NOTE - ELECTROLYTES  Pharmacy Consult for Electrolyte Monitoring and Replacement   Recent Labs: Potassium (mmol/L)  Date Value  03/16/2023 3.8   Magnesium (mg/dL)  Date Value  95/28/4132 1.7   Calcium (mg/dL)  Date Value  44/07/270 8.1 (L)   Albumin (g/dL)  Date Value  53/66/4403 2.2 (L)   Phosphorus (mg/dL)  Date Value  47/42/5956 2.8   Sodium (mmol/L)  Date Value  03/16/2023 130 (L)   Corrected Calcium: 8.76  Height: 5' 7.5" (171.5 cm) Weight: 100.7 kg (222 lb) IBW/kg (Calculated) : 67.25 Estimated Creatinine Clearance: 84.1 mL/min (by C-G formula based on SCr of 0.68 mg/dL).  Assessment  Philip Robbins is a 82 y.o. male presenting with pneumonia and hyponatremia. PMH significant for CLL, BPH, HTN, DM, hypothyroidism, HLD. Pharmacy has been consulted to monitor and replace electrolytes.  Goal of Therapy: Electrolytes within normal limits  Plan:  2 grams IV magnesium sulfate x 1 Follow-up electrolytes with AM labs tomorrow  Thank you for allowing pharmacy to be a part of this patient's care.  Lowella Bandy, PharmD Clinical Pharmacist 03/16/2023 8:30 AM

## 2023-03-16 NOTE — Progress Notes (Signed)
   Subjective: 3 Days Post-Op Procedure(s) (LRB): REVERSE SHOULDER ARTHROPLASTY (Left) Patient reports pain as mild Patient is well, and has had no acute complaints or problems Denies any CP, SOB, ABD pain. We will continue therapy today.   Right upper extremity pain well-controlled.  He has a unstable right shoulder dislocation with large Hill-Sachs lesion  Objective: Vital signs in last 24 hours: Temp:  [98 F (36.7 C)-99.5 F (37.5 C)] 98 F (36.7 C) (09/02 0842) Pulse Rate:  [91-100] 94 (09/02 0842) Resp:  [16-22] 17 (09/02 0842) BP: (116-129)/(42-49) 122/49 (09/02 0842) SpO2:  [91 %-94 %] 93 % (09/02 0842)  Intake/Output from previous day: 09/01 0701 - 09/02 0700 In: 720 [P.O.:720] Out: 1150 [Urine:1150] Intake/Output this shift: Total I/O In: 240 [P.O.:240] Out: -   Recent Labs    03/14/23 0450 03/15/23 0616 03/16/23 0437  HGB 9.7* 9.6* 9.0*   Recent Labs    03/15/23 0616 03/16/23 0437  WBC 69.0* 61.9*  RBC 3.29* 3.12*  HCT 29.3* 27.4*  PLT 418* 397   Recent Labs    03/15/23 0616 03/16/23 0437  NA 128* 130*  K 4.3 3.8  CL 95* 97*  CO2 26 27  BUN 13 16  CREATININE 0.73 0.68  GLUCOSE 202* 185*  CALCIUM 8.1* 8.1*   No results for input(s): "LABPT", "INR" in the last 72 hours.  EXAM General - Patient is Alert, Appropriate, and Oriented Left upper extremity - Neurovascular intact Sensation intact distally Intact pulses distally No cellulitis present Compartment soft Dressing - dressing C/D/I and no drainage,  Motor Function - intact, moving foot and toes/left and right upper extremity digits well on exam.   Past Medical History:  Diagnosis Date   Diabetes type 2, controlled (HCC)    Hypothyroid    UTI (urinary tract infection)     Assessment/Plan:   3 Days Post-Op Procedure(s) (LRB): REVERSE SHOULDER ARTHROPLASTY (Left) Principal Problem:   Pneumonia Active Problems:   CAP (community acquired pneumonia)   Sepsis (HCC)    Hyponatremia  Estimated body mass index is 34.26 kg/m as calculated from the following:   Height as of this encounter: 5' 7.5" (1.715 m).   Weight as of this encounter: 100.7 kg. Advance diet Up with therapy Left shoulder postoperative mild Labs and vital signs are stable Hold lovenox today NPO after midnight tonight for surgery tomorrow  Patient with right shoulder posterior dislocation and with displaced greater tuberosity fracture/very large Hill-Sachs lesion that is unstable and likely to lead to repetitive dislocations.  A right reverse total shoulder replacement is scheduled on 03/17/2023.   DVT Prophylaxis - Lovenox, TED hose, and SCDs    T. Cranston Neighbor, PA-C [facility] 03/16/2023, 10:50 AM

## 2023-03-17 ENCOUNTER — Encounter
Admission: EM | Disposition: A | Discharge status: Skilled Nursing Facility | Payer: Self-pay | Source: Home / Self Care | Attending: Student

## 2023-03-17 ENCOUNTER — Inpatient Hospital Stay: Payer: Medicare Other | Admitting: Anesthesiology

## 2023-03-17 ENCOUNTER — Other Ambulatory Visit: Payer: Self-pay

## 2023-03-17 ENCOUNTER — Inpatient Hospital Stay: Payer: Medicare Other

## 2023-03-17 DIAGNOSIS — J189 Pneumonia, unspecified organism: Secondary | ICD-10-CM | POA: Diagnosis not present

## 2023-03-17 HISTORY — PX: REVERSE SHOULDER ARTHROPLASTY: SHX5054

## 2023-03-17 LAB — GLUCOSE, CAPILLARY
Glucose-Capillary: 241 mg/dL — ABNORMAL HIGH (ref 70–99)
Glucose-Capillary: 212 mg/dL — ABNORMAL HIGH (ref 70–99)
Glucose-Capillary: 283 mg/dL — ABNORMAL HIGH (ref 70–99)
Glucose-Capillary: 307 mg/dL — ABNORMAL HIGH (ref 70–99)
Glucose-Capillary: 308 mg/dL — ABNORMAL HIGH (ref 70–99)

## 2023-03-17 LAB — CBC
HCT: 26.5 % — ABNORMAL LOW (ref 39.0–52.0)
Hemoglobin: 8.8 g/dL — ABNORMAL LOW (ref 13.0–17.0)
MCH: 29.2 pg (ref 26.0–34.0)
MCHC: 33.2 g/dL (ref 30.0–36.0)
MCV: 88 fL (ref 80.0–100.0)
Platelets: 410 10*3/uL — ABNORMAL HIGH (ref 150–400)
RBC: 3.01 MIL/uL — ABNORMAL LOW (ref 4.22–5.81)
RDW: 15.4 % (ref 11.5–15.5)
WBC: 58.3 10*3/uL (ref 4.0–10.5)
nRBC: 0 % (ref 0.0–0.2)

## 2023-03-17 LAB — HEPATIC FUNCTION PANEL
ALT: 76 U/L — ABNORMAL HIGH (ref 0–44)
AST: 48 U/L — ABNORMAL HIGH (ref 15–41)
Albumin: 2.5 g/dL — ABNORMAL LOW (ref 3.5–5.0)
Alkaline Phosphatase: 58 U/L (ref 38–126)
Bilirubin, Direct: 0.2 mg/dL (ref 0.0–0.2)
Indirect Bilirubin: 0.8 mg/dL (ref 0.3–0.9)
Total Bilirubin: 1 mg/dL (ref 0.3–1.2)
Total Protein: 5.9 g/dL — ABNORMAL LOW (ref 6.5–8.1)

## 2023-03-17 LAB — BASIC METABOLIC PANEL
Anion gap: 8 (ref 5–15)
BUN: 16 mg/dL (ref 8–23)
CO2: 27 mmol/L (ref 22–32)
Calcium: 8.1 mg/dL — ABNORMAL LOW (ref 8.9–10.3)
Chloride: 93 mmol/L — ABNORMAL LOW (ref 98–111)
Creatinine, Ser: 0.74 mg/dL (ref 0.61–1.24)
GFR, Estimated: >60 mL/min (ref >60)
Glucose, Bld: 254 mg/dL — ABNORMAL HIGH (ref 70–99)
Potassium: 3.7 mmol/L (ref 3.5–5.1)
Sodium: 128 mmol/L — ABNORMAL LOW (ref 135–145)

## 2023-03-17 LAB — TYPE AND SCREEN
ABO/RH(D): O POS
Antibody Screen: NEGATIVE

## 2023-03-17 LAB — PHOSPHORUS: Phosphorus: 3.1 mg/dL (ref 2.5–4.6)

## 2023-03-17 LAB — MAGNESIUM: Magnesium: 1.9 mg/dL (ref 1.7–2.4)

## 2023-03-17 SURGERY — ARTHROPLASTY, SHOULDER, TOTAL, REVERSE
Anesthesia: General | Site: Shoulder | Laterality: Right

## 2023-03-17 MED ORDER — KETAMINE HCL 50 MG/5ML IJ SOSY
PREFILLED_SYRINGE | INTRAMUSCULAR | Status: AC
  Filled 2023-03-17: qty 5

## 2023-03-17 MED ORDER — INSULIN ASPART 100 UNIT/ML IJ SOLN
INTRAMUSCULAR | Status: AC
  Filled 2023-03-17: qty 1

## 2023-03-17 MED ORDER — LISINOPRIL 20 MG PO TABS
20.0000 mg | ORAL_TABLET | Freq: Every day | ORAL | Status: DC
  Administered 2023-03-18 – 2023-03-20 (×3): 20 mg via ORAL
  Filled 2023-03-17 (×3): qty 1

## 2023-03-17 MED ORDER — CEFAZOLIN SODIUM-DEXTROSE 2-4 GM/100ML-% IV SOLN
2.0000 g | Freq: Three times a day (TID) | INTRAVENOUS | Status: AC
  Administered 2023-03-17 – 2023-03-18 (×3): 2 g via INTRAVENOUS
  Filled 2023-03-17 (×3): qty 100

## 2023-03-17 MED ORDER — DEXAMETHASONE SODIUM PHOSPHATE 10 MG/ML IJ SOLN
INTRAMUSCULAR | Status: AC
  Filled 2023-03-17: qty 1

## 2023-03-17 MED ORDER — ENOXAPARIN SODIUM 40 MG/0.4ML IJ SOSY
40.0000 mg | PREFILLED_SYRINGE | INTRAMUSCULAR | Status: DC
  Administered 2023-03-18 – 2023-03-20 (×3): 40 mg via SUBCUTANEOUS
  Filled 2023-03-17 (×3): qty 0.4

## 2023-03-17 MED ORDER — SALINE SPRAY 0.65 % NA SOLN
1.0000 | NASAL | Status: DC | PRN
  Administered 2023-03-17: 1 via NASAL
  Filled 2023-03-17: qty 44

## 2023-03-17 MED ORDER — BUPIVACAINE LIPOSOME 1.3 % IJ SUSP
INTRAMUSCULAR | Status: AC
  Filled 2023-03-17: qty 20

## 2023-03-17 MED ORDER — DEXAMETHASONE SODIUM PHOSPHATE 10 MG/ML IJ SOLN
INTRAMUSCULAR | Status: DC | PRN
  Administered 2023-03-17: 6 mg via INTRAVENOUS

## 2023-03-17 MED ORDER — SODIUM CHLORIDE 0.9 % IV SOLN: INTRAVENOUS | Status: DC | PRN

## 2023-03-17 MED ORDER — FENTANYL CITRATE (PF) 100 MCG/2ML IJ SOLN
INTRAMUSCULAR | Status: AC
  Filled 2023-03-17: qty 2

## 2023-03-17 MED ORDER — LIDOCAINE HCL (CARDIAC) PF 100 MG/5ML IV SOSY
PREFILLED_SYRINGE | INTRAVENOUS | Status: DC | PRN
  Administered 2023-03-17: 80 mg via INTRAVENOUS

## 2023-03-17 MED ORDER — PROPOFOL 10 MG/ML IV BOLUS
INTRAVENOUS | Status: DC | PRN
  Administered 2023-03-17: 100 mg via INTRAVENOUS
  Administered 2023-03-17: 20 mg via INTRAVENOUS

## 2023-03-17 MED ORDER — ONDANSETRON HCL 4 MG/2ML IJ SOLN
INTRAMUSCULAR | Status: AC
  Filled 2023-03-17: qty 2

## 2023-03-17 MED ORDER — ONDANSETRON HCL 4 MG/2ML IJ SOLN
INTRAMUSCULAR | Status: DC | PRN
  Administered 2023-03-17: 4 mg via INTRAVENOUS

## 2023-03-17 MED ORDER — INSULIN ASPART 100 UNIT/ML IJ SOLN
8.0000 [IU] | Freq: Once | INTRAMUSCULAR | Status: AC
  Administered 2023-03-17: 8 [IU] via SUBCUTANEOUS

## 2023-03-17 MED ORDER — CEFAZOLIN SODIUM-DEXTROSE 2-4 GM/100ML-% IV SOLN
INTRAVENOUS | Status: AC
  Filled 2023-03-17: qty 100

## 2023-03-17 MED ORDER — FENTANYL CITRATE (PF) 250 MCG/5ML IJ SOLN
INTRAMUSCULAR | Status: AC
  Filled 2023-03-17: qty 5

## 2023-03-17 MED ORDER — KETAMINE HCL 10 MG/ML IJ SOLN
INTRAMUSCULAR | Status: DC | PRN
Start: 2023-03-17 — End: 2023-03-17
  Administered 2023-03-17 (×3): 10 mg via INTRAVENOUS

## 2023-03-17 MED ORDER — OXYCODONE HCL 5 MG/5ML PO SOLN: 5.0000 mg | Freq: Once | ORAL | Status: DC | PRN

## 2023-03-17 MED ORDER — LIDOCAINE HCL (PF) 2 % IJ SOLN
INTRAMUSCULAR | Status: AC
  Filled 2023-03-17: qty 5

## 2023-03-17 MED ORDER — FENTANYL CITRATE (PF) 100 MCG/2ML IJ SOLN
25.0000 ug | INTRAMUSCULAR | Status: DC | PRN
  Administered 2023-03-17: 25 ug via INTRAVENOUS

## 2023-03-17 MED ORDER — NEOMYCIN-POLYMYXIN B GU 40-200000 IR SOLN
Status: DC | PRN
  Administered 2023-03-17: 12 mL
  Administered 2023-03-17: 4 mL

## 2023-03-17 MED ORDER — VANCOMYCIN HCL 1000 MG IV SOLR
INTRAVENOUS | Status: AC
  Filled 2023-03-17: qty 20

## 2023-03-17 MED ORDER — VANCOMYCIN HCL 1000 MG IV SOLR
INTRAVENOUS | Status: DC | PRN
  Administered 2023-03-17: 1000 mg via TOPICAL

## 2023-03-17 MED ORDER — FENTANYL CITRATE (PF) 100 MCG/2ML IJ SOLN
INTRAMUSCULAR | Status: DC | PRN
  Administered 2023-03-17 (×2): 50 ug via INTRAVENOUS

## 2023-03-17 MED ORDER — BUPIVACAINE-EPINEPHRINE 0.25% -1:200000 IJ SOLN
INTRAMUSCULAR | Status: DC | PRN
  Administered 2023-03-17: 50 mL via INTRAMUSCULAR

## 2023-03-17 MED ORDER — ROCURONIUM BROMIDE 10 MG/ML (PF) SYRINGE
PREFILLED_SYRINGE | INTRAVENOUS | Status: AC
  Filled 2023-03-17: qty 10

## 2023-03-17 MED ORDER — SUGAMMADEX SODIUM 200 MG/2ML IV SOLN
INTRAVENOUS | Status: DC | PRN
  Administered 2023-03-17: 209.2 mg via INTRAVENOUS

## 2023-03-17 MED ORDER — PHENYLEPHRINE HCL-NACL 20-0.9 MG/250ML-% IV SOLN
INTRAVENOUS | Status: AC
  Filled 2023-03-17: qty 250

## 2023-03-17 MED ORDER — VASOPRESSIN 20 UNIT/ML IV SOLN
INTRAVENOUS | Status: DC | PRN
  Administered 2023-03-17 (×6): 1 [IU] via INTRAVENOUS

## 2023-03-17 MED ORDER — SODIUM CHLORIDE 0.9 % IR SOLN
Status: DC | PRN
  Administered 2023-03-17: 3000 mL

## 2023-03-17 MED ORDER — BUPIVACAINE-EPINEPHRINE (PF) 0.25% -1:200000 IJ SOLN
INTRAMUSCULAR | Status: AC
  Filled 2023-03-17: qty 30

## 2023-03-17 MED ORDER — PROPOFOL 10 MG/ML IV BOLUS
INTRAVENOUS | Status: AC
  Filled 2023-03-17: qty 20

## 2023-03-17 MED ORDER — LACTATED RINGERS IV SOLN: INTRAVENOUS | Status: DC | PRN

## 2023-03-17 MED ORDER — PHENYLEPHRINE HCL-NACL 20-0.9 MG/250ML-% IV SOLN
INTRAVENOUS | Status: DC | PRN
Start: 2023-03-17 — End: 2023-03-17
  Administered 2023-03-17: 20 ug/min via INTRAVENOUS

## 2023-03-17 MED ORDER — TRANEXAMIC ACID-NACL 1000-0.7 MG/100ML-% IV SOLN
1000.0000 mg | INTRAVENOUS | Status: AC
  Administered 2023-03-17: 1000 mg via INTRAVENOUS

## 2023-03-17 MED ORDER — TRANEXAMIC ACID-NACL 1000-0.7 MG/100ML-% IV SOLN
INTRAVENOUS | Status: AC
  Filled 2023-03-17: qty 100

## 2023-03-17 MED ORDER — OXYCODONE HCL 5 MG PO TABS: 5.0000 mg | ORAL_TABLET | Freq: Once | ORAL | Status: DC | PRN

## 2023-03-17 MED ORDER — 0.9 % SODIUM CHLORIDE (POUR BTL) OPTIME
TOPICAL | Status: DC | PRN
  Administered 2023-03-17: 500 mL

## 2023-03-17 MED ORDER — ROCURONIUM BROMIDE 100 MG/10ML IV SOLN
INTRAVENOUS | Status: DC | PRN
  Administered 2023-03-17: 10 mg via INTRAVENOUS
  Administered 2023-03-17: 60 mg via INTRAVENOUS

## 2023-03-17 MED ORDER — VASOPRESSIN 20 UNIT/ML IV SOLN
INTRAVENOUS | Status: AC
  Filled 2023-03-17: qty 1

## 2023-03-17 MED ORDER — NEOMYCIN-POLYMYXIN B GU 40-200000 IR SOLN
Status: AC
  Filled 2023-03-17: qty 20

## 2023-03-17 SURGICAL SUPPLY — 80 items
ANCH SUT .5 CRC TPR CT 40X40 (SUTURE)
ANCH SUT 1.4 SUT TPE BLK/WHT (SUTURE) ×1
APL PRP STRL LF DISP 70% ISPRP (MISCELLANEOUS) ×2
BIT DRILL 2.5 (BIT) ×1
BIT DRILL 2.5X4.5XSCR (BIT) IMPLANT
BIT DRL 2.5X4.5XSCR (BIT) ×1
BLADE SAGITTAL WIDE XTHICK NO (BLADE) ×1 IMPLANT
BSPLAT GLND 10D RSS AETOS (Plate) ×1 IMPLANT
CHLORAPREP W/TINT 26 (MISCELLANEOUS) ×1 IMPLANT
CNTNR URN SCR LID CUP LEK RST (MISCELLANEOUS) IMPLANT
CONT SPEC 4OZ STRL OR WHT (MISCELLANEOUS) ×1
COOLER POLAR GLACIER W/PUMP (MISCELLANEOUS) ×1 IMPLANT
DRAPE INCISE IOBAN 66X45 STRL (DRAPES) ×2 IMPLANT
DRAPE SHEET LG 3/4 BI-LAMINATE (DRAPES) ×2 IMPLANT
DRAPE TABLE BACK 80X90 (DRAPES) ×1 IMPLANT
DRAPE U-SHAPE 47X51 STRL (DRAPES) ×1 IMPLANT
DRSG OPSITE POSTOP 4X8 (GAUZE/BANDAGES/DRESSINGS) IMPLANT
DRSG TEGADERM 2-3/8X2-3/4 SM (GAUZE/BANDAGES/DRESSINGS) IMPLANT
ELECT REM PT RETURN 9FT ADLT (ELECTROSURGICAL) ×1
ELECTRODE REM PT RTRN 9FT ADLT (ELECTROSURGICAL) ×1 IMPLANT
GAUZE SPONGE 2X2 STRL 8-PLY (GAUZE/BANDAGES/DRESSINGS) IMPLANT
GAUZE XEROFORM 1X8 LF (GAUZE/BANDAGES/DRESSINGS) IMPLANT
GLENOID BSPLAT 10D RSS AETOS (Plate) IMPLANT
GLENOID GUIDE PIN 2.5 NT (PIN) ×1
GLENOSPHERE CON AETOS 38 RSS (Shoulder) IMPLANT
GLOVE BIOGEL PI IND STRL 8 (GLOVE) ×2 IMPLANT
GLOVE PI ULTRA LF STRL 7.5 (GLOVE) ×2 IMPLANT
GLOVE SURG ORTHO 8.0 STRL STRW (GLOVE) ×2 IMPLANT
GLOVE SURG SYN 8.0 (GLOVE) ×1 IMPLANT
GLOVE SURG SYN 8.0 PF PI (GLOVE) ×1 IMPLANT
GOWN STRL REUS W/ TWL LRG LVL3 (GOWN DISPOSABLE) ×2 IMPLANT
GOWN STRL REUS W/ TWL XL LVL3 (GOWN DISPOSABLE) ×1 IMPLANT
GOWN STRL REUS W/TWL LRG LVL3 (GOWN DISPOSABLE) ×2
GOWN STRL REUS W/TWL XL LVL3 (GOWN DISPOSABLE) ×1
GUIDE PIN GLENOID 2.5 NT (PIN) ×1
HEMOVAC 400CC 10FR (MISCELLANEOUS) IMPLANT
HOOD PEEL AWAY T7 (MISCELLANEOUS) ×3 IMPLANT
LINER STD +3S RSS HXL (Liner) IMPLANT
MANIFOLD NEPTUNE II (INSTRUMENTS) ×1 IMPLANT
MASK FACE SPIDER DISP (MASK) ×1 IMPLANT
MAT ABSORB FLUID 56X50 GRAY (MISCELLANEOUS) ×2 IMPLANT
NDL REVERSE CUT 1/2 CRC (NEEDLE) IMPLANT
NDL SPNL 20GX3.5 QUINCKE YW (NEEDLE) IMPLANT
NEEDLE REVERSE CUT 1/2 CRC (NEEDLE) ×1 IMPLANT
NEEDLE SPNL 20GX3.5 QUINCKE YW (NEEDLE) IMPLANT
NS IRRIG 1000ML POUR BTL (IV SOLUTION) ×1 IMPLANT
PACK ARTHROSCOPY SHOULDER (MISCELLANEOUS) ×1 IMPLANT
PAD WRAPON POLAR SHDR XLG (MISCELLANEOUS) ×1 IMPLANT
PIN GUIDE GLENOID 2.5 NT (PIN) IMPLANT
PULSAVAC PLUS IRRIG FAN TIP (DISPOSABLE) ×1
SCREW BODY REVERSE LRG (Screw) IMPLANT
SCREW CENTER 4.5X30 RSS (Screw) IMPLANT
SCREW LOCK 4.5X15 RSS (Screw) IMPLANT
SCREW NLOCK 4.5X15 RSS (Screw) IMPLANT
SCREW NLOCK 4.5X25 RSS (Screw) IMPLANT
SLING ULTRA II LG (MISCELLANEOUS) IMPLANT
SLING ULTRA II M (MISCELLANEOUS) IMPLANT
SPONGE T-LAP 18X18 ~~LOC~~+RFID (SPONGE) ×1 IMPLANT
STAPLER SKIN PROX 35W (STAPLE) IMPLANT
STEM PRESS FIT SZ 10 TSS (Stem) IMPLANT
STRAP SAFETY 5IN WIDE (MISCELLANEOUS) ×1 IMPLANT
SUT ETHIBOND 5-0 MS/4 CCS GRN (SUTURE)
SUT FIBERWIRE #2 38 BLUE 1/2 (SUTURE) ×2
SUT MNCRL AB 4-0 PS2 18 (SUTURE) IMPLANT
SUT PROLENE 6 0 P 1 18 (SUTURE) IMPLANT
SUT TICRON 2-0 30IN 311381 (SUTURE) ×2 IMPLANT
SUT VIC AB 0 CT1 36 (SUTURE) ×1 IMPLANT
SUT VIC AB 2-0 CT2 27 (SUTURE) ×2 IMPLANT
SUT XBRAID 1.4 BLK/WHT (SUTURE) IMPLANT
SUT XBRAID 1.4 BLUE (SUTURE) IMPLANT
SUT XBRAID 1.4 WHITE/BLUE (SUTURE) IMPLANT
SUT XBRAID 2 BLACK/BLUE (SUTURE) IMPLANT
SUTURE ETHBND 5-0 MS/4 CCS GRN (SUTURE) IMPLANT
SUTURE FIBERWR #2 38 BLUE 1/2 (SUTURE) ×1 IMPLANT
SYR 20ML LL LF (SYRINGE) ×1 IMPLANT
SYR 30ML LL (SYRINGE) IMPLANT
TIP FAN IRRIG PULSAVAC PLUS (DISPOSABLE) ×1 IMPLANT
TRAP FLUID SMOKE EVACUATOR (MISCELLANEOUS) ×1 IMPLANT
WATER STERILE IRR 500ML POUR (IV SOLUTION) ×1 IMPLANT
WRAPON POLAR PAD SHDR XLG (MISCELLANEOUS) ×1

## 2023-03-17 NOTE — Consult Note (Signed)
PHARMACY CONSULT NOTE - ELECTROLYTES  Pharmacy Consult for Electrolyte Monitoring and Replacement   Recent Labs: Potassium (mmol/L)  Date Value  03/17/2023 3.7   Magnesium (mg/dL)  Date Value  62/95/2841 1.9   Calcium (mg/dL)  Date Value  32/44/0102 8.1 (L)   Albumin (g/dL)  Date Value  72/53/6644 2.5 (L)   Phosphorus (mg/dL)  Date Value  03/47/4259 3.1   Sodium (mmol/L)  Date Value  03/17/2023 128 (L)   Corrected Calcium: 8.76  Height: 5' 7.5" (171.5 cm) Weight: 100.7 kg (222 lb) IBW/kg (Calculated) : 67.25 Estimated Creatinine Clearance: 84.1 mL/min (by C-G formula based on SCr of 0.74 mg/dL).  Assessment  Philip Robbins is a 82 y.o. male presenting with pneumonia and hyponatremia. PMH significant for CLL, BPH, HTN, DM, hypothyroidism, HLD. Pharmacy has been consulted to monitor and replace electrolytes.  Goal of Therapy: Electrolytes within normal limits  Plan:  No electrolytes replacement needed at this time Follow-up electrolytes with AM labs tomorrow  Thank you for allowing pharmacy to be a part of this patient's care.  Philip Robbins PharmD, BCPS 03/17/2023 7:36 AM.

## 2023-03-17 NOTE — Progress Notes (Signed)
[facility] Progress Note  Patient: Philip Robbins    ZOX:[id number]  DOA: 03/08/2023     Date of Service: the patient was seen and examined on 03/17/2023  Chief Complaint  Patient presents with   Fever   Brief hospital course:  Philip Robbins is a 82 y.o. male with medical history significant of CLL under surveillance, BPH, HTN, IIDM, hypothyroidism, HLD, chronic leukocytosis presented with altered mentations hypoxia.   His symptoms started last Friday, patient went to have a left neck lymph node biopsy, after the procedure patient developed URI-like symptoms with runny nose sore throat, yesterday, patient developed strong burning sensation in the urine and dark-colored urine and went to see urgent care was diagnosed with UTI and started on antibiotics.  Patient continued to feel malaise generalized weakness, nauseous but no vomiting and no abdominal pain overnight and this morning patient was found in the bed unresponsive with large vomitus on his bed and foams in his mouth.  Family called EMS, EMS arrived and found patient hypoxic O2 saturation 95% on room air and patient was placed on 15 L NRB and sent to ED.  Fever 101.8.  Wife also witnessed patient had some twitching movement of his arms and suspect whether he had seizure.  At baseline patient has BPH appears to be poorly controlled, he reported Brueggen screams and frequent nocturnal urination but no history of UTI before.   ED Course: Fever 101.2, tachycardia hypotensive oxygen saturation 91% on 6 L.  Chest x-ray showed bilateral infiltrates indicating for pneumonia.  WBC 35.1.  Hemoglobin 12, sodium 118, K4.1, bicarb 12, creatinine 1.1, troponin 64> 99, AST 67, ALT 46   Patient was given 3 L IV bolus and started on ceftriaxone, azithromycin Assessment and Plan:  # B/L Humral fracture No h/o fall, possible trauma while transportation as pt was combative and he was held strongly by EMS.  CT left shoulder shows dislocation, fracture  and hemarthrosis Orthopedics consulted 8/30 s/p  Left reverse total shoulder arthroplasty,  Left biceps tenodesis And Right closed management of proximal humerus fracture  8/30 CT right shoulder consistent with dislocation and fracture 9/3 right shoulder surgery is scheduled today Follow orthopedic surgery for further recommendation on discharge   Sepsis, severe, Resolved -Evidenced by new onset of hypoxia, elevated temperature and elevated lactic acid with signs of endorgan damage of acute encephalopathy and source of infection likely aspiration pneumonia and UTI. s/p IVF given as per protocol.  -s/p ceftriaxone and azithromycin to cover pneumonia and UTI for 5 days.  Completed course.  Acute hypoxic respiratory failure secondary to pneumonia and COPD/asthma exacerbation  Patient does not have diagnosis of COPD or asthma Patient was wheezing, resolved on 8/28 Continue above antibiotics for pneumonia 8/27  prednisone 40 mg p.o. daily for 3 days Started Breo Ellipta inhaler and DuoNeb TID 8/28 respiratory failure resolved, patient saturating well on room air. 8/29 dyspnea on exertion, hypoxia on ambulation, continue supplemental O2 inhalation as needed 8/30 started Lasix 40 mg IV twice daily for 4 days  Aspiration pneumonia -Likely secondary to repeated vomiting at night, managed as above  UTI -Likely secondary to uncontrolled BPH, s/p Antibiotics -outpatient follow-up with urology for urodynamic study -Continue Flomax  acute hypotonic hyponatremia -Hypovolemic, likely secondary to acute GI loss from repeated vomiting overnight and sepsis Serum osmolality 257 low, TSH 2.6 within normal range Nephro consulted, s/p saline 3% IVF discontinued on 8/27 Na 118------134--131--128 --130 --128 Sodium level fluctuating, continue to monitor. Continue fluid restriction  1.5 L/day   # Hypophosphatemia, Phos repleted.  Resolved # Hypomagnesemia, mag repleted.  Resolved Monitor electrolytes  daily.  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  Repeat sodium level and likely will start hypertonic saline, consult nephrology. -Seizure precaution.   Troponin elevation -No chest pains, EKG showed no acute ST changes and the poor R wave progression appears to be chronic. -Etiology likely related to sepsis and acute hypoxia and demanding ischemia.  Treat sepsis, repeat troponin this evening.   CLL with chronic leukocytosis -Peripheral smear is being reviewed to rule out acute transformation -Management of sepsis as above 8/29 WBC count is significantly elevated, could be due to steroids.  Thrombocytosis, continue to monitor Monitor CBC daily  HTN Blood pressure improved 8/27 resumed lisinopril 20 mg p.o. 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 weekly, follow with PCP to repeat vitamin D level after 3 to 6 months.  Vitamin B12 deficiency: Started vitamin B12 1000 mcg oral supplement, avoided IM injection due to bilateral humeral fractures Follow-up PCP to repeat vitamin B12 level after 3 to 6 months.  Iron deficiency, transferrin saturation 6%, started oral iron supplement.  Avoided IV iron due to active infection. Follow-up with PCP to repeat iron profile after 3 to 6 months Folic acid level 8.3, at lower end, started oral supplement for 3 months.   Body mass index is 35.58 kg/m.  Interventions:  Diet: Heart healthy diet DVT Prophylaxis: Subcutaneous Lovenox   Advance goals of care discussion: Full code  Family Communication: family was present at bedside, at the time of interview.  The pt provided permission to discuss medical plan with the family.  Opportunity was given to ask question and all questions were answered satisfactorily.   Disposition:  Pt is from Home, admitted with hyponatremia, pneumonia, respiratory failure, UTI, b/l.  Humerus fracture, still has resp failure and low sodium, which precludes a safe discharge. Discharge to SNF , when medically stable. Discharge planning when patient is cleared by orthopedic surgery   Subjective: No significant overnight events.  Shoulder pain is well-controlled.  Patient is scheduled for right shoulder surgery today.  Patient was resting comfortably, denied any active issues.  Physical Exam: General: NAD, lying comfortably Appear in no distress, affect appropriate Eyes: PERRLA ENT: Oral Mucosa Clear, moist  Neck: no JVD,  Cardiovascular: S1 and S2 Present, no Murmur,  Respiratory: Good air entry bilaterally, b/l  crackles and no wheezing. Abdomen: Bowel Sound present, Soft and no tenderness,  Skin: no rashes Extremities: mild Pedal edema, no calf tenderness.  Bilateral shoulders slings intact. S/p Left shoulder surgery. Neurologic: without any new focal findings Gait not checked due to patient safety concerns  Vitals:   03/17/23 0700 03/17/23 0821 03/17/23 1051 03/17/23 1127  BP:  (!) 126/52  (!) 128/45  Pulse:  83  95  Resp:  16 16 17   Temp:  98.3 F (36.8 C)  98.5 F (36.9 C)  TempSrc:    Temporal  SpO2:  93%  92%  Weight: 104.6 kg     Height:        Intake/Output Summary (Last 24 hours) at 03/17/2023 1434 Last data filed at 03/17/2023 1355 Gross per 24 hour  Intake 200 ml  Output 976 ml  Net -776 ml   Filed Weights   03/12/23 0500 03/13/23 0913 03/17/23 0700  Weight: 107 kg 100.7 kg 104.6 kg    Data Reviewed: I have personally reviewed and interpreted daily labs, tele strips, imagings as discussed above. I reviewed all nursing notes, pharmacy notes, vitals, pertinent old records I have discussed plan of care as described above with RN and  patient/family.  CBC: Recent Labs  Lab 03/13/23 0342 03/14/23 0450 03/15/23 0616 03/16/23 0437 03/17/23 0314  WBC 54.1* 60.5* 69.0* 61.9* 58.3*  HGB 9.6* 9.7* 9.6* 9.0* 8.8*  HCT 28.8* 29.9* 29.3* 27.4* 26.5*  MCV 87.5 90.9 89.1 87.8 88.0  PLT 363 408* 418* 397 410*   Basic Metabolic Panel: Recent Labs  Lab 03/13/23 0342 03/14/23 0450 03/15/23 0616 03/16/23 0437 03/17/23 0314  NA 134* 131* 128* 130* 128*  K 4.3 4.7 4.3 3.8 3.7  CL 96* 99 95* 97* 93*  CO2 24 23 26 27 27   GLUCOSE 269* 177* 202* 185* 254*  BUN 22 16 13 16 16   CREATININE 0.79 0.64 0.73 0.68 0.74  CALCIUM 9.2 7.8* 8.1* 8.1* 8.1*  MG 1.8 2.1 1.7 1.7 1.9  PHOS 3.4 3.2 2.4* 2.8 3.1    Studies: No results found.  Scheduled Meds:  [MAR Hold] vitamin C  500 mg Oral Daily   [MAR Hold] Chlorhexidine Gluconate Cloth  6 each Topical Q0600   [MAR Hold] vitamin B-12  1,000 mcg Oral Daily   [MAR Hold] diclofenac Sodium  2 g Topical QID   [MAR Hold] docusate sodium  100 mg Oral BID   [MAR Hold] fluticasone furoate-vilanterol  1 puff Inhalation Daily   [MAR Hold] folic acid  1 mg Oral Daily   [MAR Hold] guaiFENesin  600 mg Oral BID   [MAR Hold] insulin aspart  0-15 Units Subcutaneous TID WC   [MAR Hold] insulin aspart  0-5 Units Subcutaneous QHS   [MAR Hold] insulin aspart  4 Units Subcutaneous TID WC   [MAR Hold] iron polysaccharides  150 mg Oral Daily   [MAR Hold] levothyroxine  50 mcg Oral Q0600   [MAR Hold] lisinopril  20 mg Oral Daily   [MAR Hold] multivitamin with minerals  1 tablet Oral Daily   [MAR Hold] pantoprazole  40 mg Oral Daily   [MAR Hold] Ensure Max Protein  11 oz Oral BID   [MAR Hold] tamsulosin  0.4 mg Oral QPC supper   [MAR Hold] Vitamin D (Ergocalciferol)  50,000 Units Oral Q7 days   Continuous Infusions:    PRN Meds: 0.9 % irrigation (POUR BTL), [MAR Hold] alum & mag hydroxide-simeth, [MAR Hold] bisacodyl, bupivacaine-EPINEPHrine (MARCAINE W/ EPI) 0.25% -1:200000 30 mL, bupivacaine  liposome (EXPAREL) 1.3 % 20 mL, [MAR Hold] chlorpheniramine-HYDROcodone, [MAR Hold] hydrALAZINE, HYDROmorphone (DILAUDID) injection, [MAR Hold] ipratropium-albuterol, [MAR Hold] menthol-cetylpyridinium **OR** [MAR Hold] phenol, metoCLOPramide **OR** metoCLOPramide (REGLAN) injection, neomycin-polymyxin B, ondansetron **OR** ondansetron (ZOFRAN) IV, oxyCODONE, oxyCODONE, [MAR Hold] senna-docusate, [MAR Hold] sodium chloride, sodium chloride irrigation, vancomycin  Time spent: 40 minutes  Author: Gillis Robbins. MD [facility] 03/17/2023 2:34 PM  To reach On-call, see care teams to locate the attending and reach out to them via [email or web address]. If 7PM-7AM, please contact night-coverage If you still have difficulty reaching the attending provider, please page the [facility] (Director on Call) for [facility] on amion for assistance.

## 2023-03-17 NOTE — Anesthesia Preprocedure Evaluation (Signed)
Anesthesia Evaluation  Patient identified by MRN, date of birth, ID band Patient awake    Reviewed: Allergy & Precautions, NPO status , Patient's Chart, lab work & pertinent test results  History of Anesthesia Complications Negative for: history of anesthetic complications  Airway Mallampati: III  TM Distance: <3 FB Neck ROM: full    Dental  (+) Chipped, Poor Dentition   Pulmonary shortness of breath and with exertion, pneumonia, unresolved    + decreased breath sounds      Cardiovascular (-) angina (-) Past MI negative cardio ROS Normal cardiovascular exam     Neuro/Psych negative neurological ROS  negative psych ROS   GI/Hepatic negative GI ROS, Neg liver ROS,neg GERD  ,,  Endo/Other  diabetes, Type 2Hypothyroidism    Renal/GU      Musculoskeletal   Abdominal   Peds  Hematology negative hematology ROS (+)   Anesthesia Other Findings Past Medical History: No date: Diabetes type 2, controlled (HCC) No date: Hypothyroid No date: UTI (urinary tract infection)  Past Surgical History: No date: CHOLECYSTECTOMY  BMI    Body Mass Index: 35.58 kg/m      Reproductive/Obstetrics negative OB ROS                             Anesthesia Physical Anesthesia Plan  ASA: 3  Anesthesia Plan: General ETT   Post-op Pain Management:    Induction: Intravenous  PONV Risk Score and Plan: Ondansetron, Dexamethasone, Midazolam and Treatment may vary due to age or medical condition  Airway Management Planned: Oral ETT  Additional Equipment:   Intra-op Plan:   Post-operative Plan: Extubation in OR and Possible Post-op intubation/ventilation  Informed Consent: I have reviewed the patients History and Physical, chart, labs and discussed the procedure including the risks, benefits and alternatives for the proposed anesthesia with the patient or authorized representative who has indicated his/her  understanding and acceptance.     Dental Advisory Given  Plan Discussed with: Anesthesiologist, CRNA and Surgeon  Anesthesia Plan Comments: (Patient consented for risks of anesthesia including but not limited to:  - adverse reactions to medications - damage to eyes, teeth, lips or other oral mucosa - nerve damage due to positioning  - sore throat or hoarseness - Damage to heart, brain, nerves, lungs, other parts of body or loss of life  Patient voiced understanding.)       Anesthesia Quick Evaluation

## 2023-03-17 NOTE — Plan of Care (Signed)
  Problem: Activity: Goal: Ability to tolerate increased activity will improve Outcome: Progressing   Problem: Respiratory: Goal: Ability to maintain adequate ventilation will improve Outcome: Progressing Goal: Ability to maintain a clear airway will improve Outcome: Progressing   Problem: Tissue Perfusion: Goal: Adequacy of tissue perfusion will improve Outcome: Progressing   Problem: Coping: Goal: Level of anxiety will decrease Outcome: Progressing   Problem: Safety: Goal: Ability to remain free from injury will improve Outcome: Progressing   Problem: Skin Integrity: Goal: Risk for impaired skin integrity will decrease Outcome: Progressing   Problem: Activity: Goal: Ability to tolerate increased activity will improve Outcome: Progressing   Problem: Pain Management: Goal: Pain level will decrease with appropriate interventions Outcome: Progressing

## 2023-03-17 NOTE — Progress Notes (Signed)
Nutrition Follow-up  DOCUMENTATION CODES:   Obesity unspecified  INTERVENTION:   -Once diet is advanced post-op, resume:  -Ensure Max po BID, each supplement provides 150 kcal and 30 grams of protein.   -MVI with minerals daily  NUTRITION DIAGNOSIS:   Increased nutrient needs related to cancer and cancer related treatments as evidenced by estimated needs.  Ongoing  GOAL:   Patient will meet greater than or equal to 90% of their needs  Progressing   MONITOR:   PO intake, Supplement acceptance, Labs, Weight trends, I & O's, Skin  REASON FOR ASSESSMENT:   Malnutrition Screening Tool    ASSESSMENT:   82 y.o. male with medical history significant of CLL under surveillance, BPH, HTN, IIDM, hypothyroidism, HLD, hypothyroidism and IBS who is admitted with CAP, UTI, sepsis and R proximal humerus fracture and L proximal humerus fracture/dislocation.  8/30- s/p 1. Left reverse total shoulder arthroplasty 2. Left biceps tenodesis  Reviewed I/O's: -610 ml x 24 hours and -6.8 L since admission  UOP: 850 ml x 24 hours   Per orthopedics note on 03/14/23, pt with with right shoulder posterior dislocation and with displaced greater tuberosity fracture/very large Hill-Sachs lesion that is unstable and likely to lead to repetitive dislocations . Plan for rt reverse total shoulder replacement scheduled for today. Pt is currently NPO for procedure.   Pt previously on a carb modified diet. Noted meal completions 100%.   No new wt to assess at this time.   Per TOC notes, plan for SNF placement once medically stable.   Medications reviewed and include vitamins C, vitamin B-12, folic acid, and vitamin D.   Labs reviewed: Na: 128, CBGS: 205-263 (inpatient orders for glycemic control are 0-15 units insulin aspart TID with meals, 0-5 units insulin aspart daily at bedtime, and 4 units insulin aspart TID with meals).    Diet Order:   Diet Order             Diet NPO time specified  Diet  effective midnight                   EDUCATION NEEDS:   Education needs have been addressed  Skin:  Skin Assessment: Skin Integrity Issues: Skin Integrity Issues:: Incisions Incisions: closed lt shoulder  Last BM:  03/15/23  Height:   Ht Readings from Last 1 Encounters:  03/13/23 5' 7.5" (1.715 m)    Weight:   Wt Readings from Last 1 Encounters:  03/17/23 104.6 kg    Ideal Body Weight:  75.45 kg  BMI:  Body mass index is 35.58 kg/m.  Estimated Nutritional Needs:   Kcal:  2100-2400kcal/day  Protein:  105-120g/day  Fluid:  1.9-2.2L/day    Levada Schilling, RD, LDN, CDCES Registered Dietitian II Certified Diabetes Care and Education Specialist Please refer to [facility] for RD and/or RD on-call/weekend/after hours pager

## 2023-03-17 NOTE — Progress Notes (Signed)
   Subjective: 4 Days Post-Op Procedure(s) (LRB): REVERSE SHOULDER ARTHROPLASTY (Left) Patient reports pain as mild Patient is well, and has had no acute complaints or problems Denies any CP, SOB, ABD pain. We will continue therapy today.   Right upper extremity pain well-controlled.  He has a unstable right shoulder dislocation with large Hill-Sachs lesion  Objective: Vital signs in last 24 hours: Temp:  [98 F (36.7 C)-98.8 F (37.1 C)] 98.3 F (36.8 C) (09/03 0420) Pulse Rate:  [85-102] 85 (09/03 0420) Resp:  [17-20] 20 (09/03 0420) BP: (117-127)/(43-52) 119/52 (09/03 0420) SpO2:  [90 %-93 %] 93 % (09/03 0420)  Intake/Output from previous day: 09/02 0701 - 09/03 0700 In: 240 [P.O.:240] Out: 850 [Urine:850] Intake/Output this shift: No intake/output data recorded.  Recent Labs    03/15/23 0616 03/16/23 0437 03/17/23 0314  HGB 9.6* 9.0* 8.8*   Recent Labs    03/16/23 0437 03/17/23 0314  WBC 61.9* 58.3*  RBC 3.12* 3.01*  HCT 27.4* 26.5*  PLT 397 410*   Recent Labs    03/16/23 0437 03/17/23 0314  NA 130* 128*  K 3.8 3.7  CL 97* 93*  CO2 27 27  BUN 16 16  CREATININE 0.68 0.74  GLUCOSE 185* 254*  CALCIUM 8.1* 8.1*   No results for input(s): "LABPT", "INR" in the last 72 hours.  EXAM General - Patient is Alert, Appropriate, and Oriented Left upper extremity - Neurovascular intact Sensation intact distally Intact pulses distally No cellulitis present Compartment soft Dressing - dressing C/D/I and no drainage,  Motor Function - intact, moving foot and toes/left and right upper extremity digits well on exam.  Ambulated 40 feet on Sunday.  Past Medical History:  Diagnosis Date   Diabetes type 2, controlled (HCC)    Hypothyroid    UTI (urinary tract infection)     Assessment/Plan:   4 Days Post-Op Procedure(s) (LRB): REVERSE SHOULDER ARTHROPLASTY (Left) Principal Problem:   Pneumonia Active Problems:   CAP (community acquired pneumonia)    Sepsis (HCC)   Hyponatremia  Estimated body mass index is 34.26 kg/m as calculated from the following:   Height as of this encounter: 5' 7.5" (1.715 m).   Weight as of this encounter: 100.7 kg. Advance diet Up with therapy Left shoulder postoperative mild Labs and vital signs are stable Hold lovenox today NPO for surgery today  Patient with right shoulder posterior dislocation and with displaced greater tuberosity fracture/very large Hill-Sachs lesion that is unstable and likely to lead to repetitive dislocations.  A right reverse total shoulder replacement is scheduled today.   DVT Prophylaxis - Lovenox, TED hose, and SCDs    T. Cranston Neighbor, PA-C [facility] 03/17/2023, 7:15 AM

## 2023-03-17 NOTE — Anesthesia Postprocedure Evaluation (Signed)
Anesthesia Post Note  Patient: Chana Bode  Procedure(s) Performed: REVERSE SHOULDER ARTHROPLASTY (Right: Shoulder)  Patient location during evaluation: PACU Anesthesia Type: General Level of consciousness: awake and alert Pain management: pain level controlled Vital Signs Assessment: post-procedure vital signs reviewed and stable Respiratory status: spontaneous breathing, nonlabored ventilation, respiratory function stable and patient connected to nasal cannula oxygen Cardiovascular status: blood pressure returned to baseline and stable Postop Assessment: no apparent nausea or vomiting Anesthetic complications: no   No notable events documented.   Last Vitals:  Vitals:   03/17/23 1737 03/17/23 1742  BP: (!) 132/57   Pulse: 94 94  Resp: 13 17  Temp:    SpO2: 93% 93%    Last Pain:  Vitals:   03/17/23 1730  TempSrc:   PainSc: Asleep                 Yevette Edwards

## 2023-03-17 NOTE — Transfer of Care (Signed)
Immediate Anesthesia Transfer of Care Note  Patient: Philip Robbins  Procedure(s) Performed: REVERSE SHOULDER ARTHROPLASTY (Right: Shoulder)  Patient Location: PACU  Anesthesia Type:General  Level of Consciousness: awake, alert , and oriented  Airway & Oxygen Therapy: Patient Spontanous Breathing and Patient connected to face mask oxygen  Post-op Assessment: Report given to RN and Post -op Vital signs reviewed and stable  Post vital signs: stable  Last Vitals:  Vitals Value Taken Time  BP    Temp    Pulse 97 03/17/23 1622  Resp 21 03/17/23 1622  SpO2 91 % 03/17/23 1622  Vitals shown include unfiled device data.  Last Pain:  Vitals:   03/17/23 1127  TempSrc: Temporal  PainSc: 1       Patients Stated Pain Goal: 0 (03/16/23 2353)  Complications: No notable events documented.

## 2023-03-17 NOTE — Inpatient Diabetes Management (Signed)
Inpatient Diabetes Program Recommendations  AACE/ADA: New Consensus Statement on Inpatient Glycemic Control (2015)  Target Ranges:  Prepandial:   less than 140 mg/dL      Peak postprandial:   less than 180 mg/dL (1-2 hours)      Critically ill patients:  140 - 180 mg/dL    Latest Reference Range & Units 03/16/23 08:48 03/16/23 11:19 03/16/23 16:15 03/16/23 20:39  Glucose-Capillary 70 - 99 mg/dL 284 (H)  9 units Novolog  263 (H)  12 units Novolog  215 (H)  9 units Novolog  205 (H)  2 units Novolog   (H): Data is abnormally high  Latest Reference Range & Units 03/17/23 08:16  Glucose-Capillary 70 - 99 mg/dL 132 (H)  5 units Novolog   (H): Data is abnormally high    Home DM Meds: Metformin 1000 mg BID       Actos 45 mg daily       Januvia 50 mg daily  Current Orders: Novolog Moderate Correction Scale/ SSI (0-15 units) TID AC + HS     Novolog 4 units TID with meals   MD- Note AM CBGs have been elevated the last 2 days.  Home oral DM meds are on hold  May consider starting low dose basal insulin: Semglee 10 units Daily (0.1 units/kg)    --Will follow patient during hospitalization--  Ambrose Finland RN, MSN, CDCES Diabetes Coordinator Inpatient Glycemic Control Team Team Pager: [phone] (8a-5p)

## 2023-03-17 NOTE — Op Note (Signed)
Operative Note    SURGERY DATE: 03/17/2023   PRE-OP DIAGNOSIS:  1. Right 4-part proximal humerus fracture with posterior glenonhumeral dislocation   POST-OP DIAGNOSIS:  1. Right 4-part proximal humerus fracture with posterior glenonhumeral dislocation   PROCEDURES:  1. Right reverse total shoulder arthroplasty 2. Right biceps tenodesis   SURGEON: Rosealee Albee, MD  ASSISTANTS: OR staff   ANESTHESIA: Gen + Exparel interscalene block   ESTIMATED BLOOD LOSS: 250cc   TOTAL IV FLUIDS: see anesthesia record  IMPLANTS: S&N Aetos 10 deg Augmented baseplate w/central +3 peripheral 4.69mm screws, 38mm concentric glenosphere; Titan Reverse Body Large and Body Screw; TSS Press Fit Size 10 stem; +3 Standard Poly Liner  INDICATION(S):  Philip Robbins is 82 y.o. male who sustained a displaced 4-part proximal humerus fracture with posterior glenonhumeral dislocation. This occurred after the patient had an episode of altered mental status, sepsis, UTI, and electrolyte imbalance. He was also found to have an aspiration pneumonia.  Of note, he also had a contralateral left four-part proximal humerus fracture with posterior glenohumeral dislocation.  He underwent left reverse shoulder arthroplasty on 03/13/2023.  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 After discussion of risks, benefits, and alternatives to surgery, the patient elected to proceed with right reverse shoulder arthroplasty and biceps tenodesis.   OPERATIVE FINDINGS: 4-part proximal humerus fracture with posterior glenonhumeral dislocation, biceps tendinopathy   OPERATIVE REPORT:   I identified Camilo A Leiva in the pre-operative holding area. Informed consent was obtained and the surgical site was marked. I reviewed the risks and benefits of the proposed surgical intervention and the patient (and/or patient's guardian) wished to proceed. An interscalene block with  Exparel was administered by the Anesthesia team. The patient was transferred to the operative suite and general anesthesia was administered. The patient was placed in the beach chair position with the head of the bed elevated approximately 30 degrees. All down side pressure points were appropriately padded. Appropriate IV antibiotics were administered. Tranexamic acid was also administered after verifying that the patient had no contraindications. The extremity was then prepped and draped in standard fashion. A time out was performed confirming the correct extremity, correct patient, and correct procedure.   We used the standard deltopectoral incision from the coracoid to ~12cm distal.  Vancomycin powder was placed in the dermis.  We found the cephalic vein and took it laterally. We opened the deltopectoral interval widely and placed retractors under the CA ligament in the subacromial space and under the deltoid tendon at its insertion. We then released the underlying bursa between these retractors, taking care not to damage the circumflex branch of the axillary nerve.   We opened the clavipectoral fascia lateral to the conjoint tendon.  The arm was then internally rotated, we cut the falciform ligament at approximately 1 cm of the upper portion of the pectoralis major insertion. Next we unroofed the bicipital groove. Biceps tendon was significantly erythematous and inflamed. We proceeded with a soft tissue biceps tenodesis given the pathology of the tendon.  After opening the biceps tendon sheath all the way to the supraglenoid tubercle, we performed a biceps tenodesis with two #2 TiCron sutures to the upper border of the pectoralis major. The proximal portion of the tendon was excised.   FiberWire sutures were placed within the supraspinatus, infraspinatus, and subscapularis to obtain appropriate control of the tuberosities. The articular humeral head portion was removed.  The supraspinatus tendon itself was  excised.  The infraspinatus was noted to be attached to the greater tuberosity fragment.  Cancellous bone was removed from the removed humeral head to be used later as bone graft.  #5 Ethibond sutures were placed around the greater tuberosity at the bone-tendon junction.  Additionally, two  X-Braid suture were placed around the greater tuberosity (one to serve as tuberosity to fin sutures and one to serve as an around the world suture).   A posterior retractor was used to retract greater tuberosity and the humeral shaft, exposing the glenoid.  The anterior capsule was separated from the subscapularis and excised from the lesser tuberosity to the glenoid.  The labrum and the remnant of the biceps tendon were removed in a circumferential fashion.  The attachment of the triceps muscle was released off of the inferior glenoid.  During the glenoid exposure, the axillary nerve was protected the entire time.  Appropriate retractors were placed and there was excellent glenoid visualization.  The central guidepin was drilled with a 10 degree inferior tilt guide.  On preoperative imaging, the patient had approximately relatively neutral glenoid version.  This was not changed.  The glenoid was reamed in order to remove the cartilaginous surface without taking away too much bone.  The hole for the central post was drilled. The baseplate was impacted in a press-fit fashion. The central screw was drilled and placed.  2 nonlocking peripheral screws were placed for additional baseplate compression, and 1 additional locking screw was then placed.  Peripheral reamer was used to clear out any debris and allow for appropriate glenosphere placement.  Glenosphere was impacted and found to be firmly seated.  Glenosphere screw was tightened.   We then turned our attention to the humerus.  The humeral canal was sounded and sized appropriately until excellent diaphyseal fit was achieved.  Broaching was performed at ~20 degrees of  retroversion.  Standard body with various sizes of the poly was trialed.  The joint was reduced and noted to have satisfactory stability, motion, and deltoid tension with adequate tuberosity reduction.  Trial implants were removed.  We drilled two holes into the shaft on either side of the bicipital groove ~2cm inferior to the humeral fracture line and passed two Xbraid tapes through both holes to serve as vertical tuberosity fixation sutures.  The previously passed Xbraid sutures in the greater tuberosity were passed through the lateral suture holes in the stem.  The stem was then inserted into the appropriate position.  Excellent press-fit was achieved.  Poly was trialed and above noted poly size was placed.   Next, #5 Ethibond sutures from the greater tuberosity were passed around the lesser tuberosity to serve as horizontal cerclage sutures.  One of the Xbraid tapes previously passed through the implant was then passed around the lesser tuberosity as well to serve as an around the world suture (greater tuberosity to stem to the lesser tuberosity).  Both vertical sutures were passed through the greater and lesser tuberosities.  Bone graft was placed between the tuberosities and the implant to help stimulate bone healing.  The humerus was externally rotated. The greater tuberosity to implant XBraid suture was tied first, securing the greater tuberosity to the implant.  Next, the horizontal cerclage Ethibond sutures were tied, and this was followed by the around the world XBraid suture. Finally, vertical sutures were tied. This achieved excellent stability of the tuberosities and they were stable to external and internal rotation.   Final confirmation of motion, tension, and stability were satisfactory. The  wound was thoroughly irrigated. Vancomycin powder was placed.  A Hemovac drain was placed.    The deltopectoral interval was closed with a running, 0-Vicryl suture. The skin was closed with 2-0 Vicryl  and staples. Xeroform and Honeycomb dressing were applied. A PolarCare unit and sling were placed. Patient was extubated, transferred to a stretcher bed and to the post anesthesia care unit in stable condition.    Additionally, this case had significantly increased complexity and surgical time compared to standard reverse shoulder arthroplasty.  This was due to this being a 4 part proximal humerus fracture.  This caused significantly distorted anatomy which required more careful and meticulous dissection, adding to surgical time.  Additionally, lesser and greater tuberosity repair required multiple sutures to be placed and tied.  Both of these factors added approximately 45 minutes to the surgical time compared to that for a standard reverse shoulder arthroplasty.   POSTOPERATIVE PLAN: Operative arm to remain in sling and abduction pillow with arm at neutral at all times except RoM exercises and hygiene. Can perform pendulums, elbow/wrist/hand RoM exercises. Can perform active elbow flexion with arm adducted (elbow by side) to allow for ADLs. Passive RoM allowed to 90 FF and 30 ER. ASA 325mg  x 6 weeks for DVT ppx. Patient to return to clinic in ~2 weeks for post-operative appointment.

## 2023-03-17 NOTE — H&P (Signed)
H&P reviewed. Plan for RSA for R proximal humerus fracture and posterior glenohumeral dislocation with very large reverse Hill-Sachs lesion.

## 2023-03-18 ENCOUNTER — Encounter: Payer: Self-pay | Admitting: Orthopedic Surgery

## 2023-03-18 DIAGNOSIS — S42301A Unspecified fracture of shaft of humerus, right arm, initial encounter for closed fracture: Secondary | ICD-10-CM | POA: Diagnosis not present

## 2023-03-18 DIAGNOSIS — J9601 Acute respiratory failure with hypoxia: Secondary | ICD-10-CM | POA: Diagnosis not present

## 2023-03-18 DIAGNOSIS — S42302A Unspecified fracture of shaft of humerus, left arm, initial encounter for closed fracture: Secondary | ICD-10-CM

## 2023-03-18 DIAGNOSIS — A419 Sepsis, unspecified organism: Secondary | ICD-10-CM | POA: Diagnosis not present

## 2023-03-18 DIAGNOSIS — E871 Hypo-osmolality and hyponatremia: Secondary | ICD-10-CM

## 2023-03-18 DIAGNOSIS — R6520 Severe sepsis without septic shock: Secondary | ICD-10-CM

## 2023-03-18 LAB — CBC
HCT: 30.8 % — ABNORMAL LOW (ref 39.0–52.0)
Hemoglobin: 10.4 g/dL — ABNORMAL LOW (ref 13.0–17.0)
MCH: 29.3 pg (ref 26.0–34.0)
MCHC: 33.8 g/dL (ref 30.0–36.0)
MCV: 86.8 fL (ref 80.0–100.0)
Platelets: 367 10*3/uL (ref 150–400)
RBC: 3.55 MIL/uL — ABNORMAL LOW (ref 4.22–5.81)
RDW: 15.2 % (ref 11.5–15.5)
WBC: 56.6 10*3/uL (ref 4.0–10.5)
nRBC: 0 % (ref 0.0–0.2)

## 2023-03-18 LAB — HEPATIC FUNCTION PANEL
ALT: 62 U/L — ABNORMAL HIGH (ref 0–44)
AST: 36 U/L (ref 15–41)
Albumin: 2.3 g/dL — ABNORMAL LOW (ref 3.5–5.0)
Alkaline Phosphatase: 56 U/L (ref 38–126)
Bilirubin, Direct: 0.2 mg/dL (ref 0.0–0.2)
Indirect Bilirubin: 0.1 mg/dL — ABNORMAL LOW (ref 0.3–0.9)
Total Bilirubin: 0.3 mg/dL (ref 0.3–1.2)
Total Protein: 5.4 g/dL — ABNORMAL LOW (ref 6.5–8.1)

## 2023-03-18 LAB — GLUCOSE, CAPILLARY
Glucose-Capillary: 259 mg/dL — ABNORMAL HIGH (ref 70–99)
Glucose-Capillary: 260 mg/dL — ABNORMAL HIGH (ref 70–99)
Glucose-Capillary: 197 mg/dL — ABNORMAL HIGH (ref 70–99)
Glucose-Capillary: 224 mg/dL — ABNORMAL HIGH (ref 70–99)

## 2023-03-18 LAB — BASIC METABOLIC PANEL
Anion gap: 8 (ref 5–15)
BUN: 16 mg/dL (ref 8–23)
CO2: 24 mmol/L (ref 22–32)
Calcium: 7.9 mg/dL — ABNORMAL LOW (ref 8.9–10.3)
Chloride: 96 mmol/L — ABNORMAL LOW (ref 98–111)
Creatinine, Ser: 0.67 mg/dL (ref 0.61–1.24)
GFR, Estimated: >60 mL/min (ref >60)
Glucose, Bld: 253 mg/dL — ABNORMAL HIGH (ref 70–99)
Potassium: 4.3 mmol/L (ref 3.5–5.1)
Sodium: 128 mmol/L — ABNORMAL LOW (ref 135–145)

## 2023-03-18 LAB — MAGNESIUM: Magnesium: 1.8 mg/dL (ref 1.7–2.4)

## 2023-03-18 LAB — PHOSPHORUS: Phosphorus: 2.5 mg/dL (ref 2.5–4.6)

## 2023-03-18 MED ORDER — OXYCODONE HCL 5 MG PO TABS: 5.0000 mg | ORAL_TABLET | Freq: Four times a day (QID) | ORAL | 0 refills | Status: DC | PRN

## 2023-03-18 MED ORDER — MAGNESIUM SULFATE 2 GM/50ML IV SOLN
2.0000 g | Freq: Once | INTRAVENOUS | Status: AC
  Administered 2023-03-18: 2 g via INTRAVENOUS
  Filled 2023-03-18: qty 50

## 2023-03-18 NOTE — Evaluation (Signed)
Physical Therapy Re-Evaluation Patient Details Name: Philip Robbins MRN: [id number] DOB: 1940-08-31 Today's Date: 03/18/2023  History of Present Illness  Pt is an 82 y/o M admitted on 03/08/23 after presenting with c/o altered mentation & hypoxia. Pt was previously diagnosed with UTI at Urgent Care. Pt was found to have sepsis, acute hypoxic respiratory failure 2/2 PNA & COPD/asthma exacerbation. Pt also found to have bilateral humeral fxs. Pt is s/p L reverse TSA & L biceps tenodesis on 03/13/23 & R reverse shoulder arthroplasty on 03/17/23. PMH: CLL, BPH, HTN, IIDM, hypothyroidism, HLD, chronic leukocytosis  Clinical Impression  Pt seen for PT re-evaluation with daughter present & wife arriving for session. Pt reports he plans to d/c to his daughter's house vs his house, where she can provide 24 hr supervision/assistance. PT provided extensive education to pt & family on modifications for environment and mobility with pt & family voicing understanding. Pt requires mod assist for supine>sit 2/2 decreased core strength & inability to use BUE to upright himself. Pt is able to transfer STS with CGA without AD & ambulate 1 lap around nurses station with 2 standing rest breaks with CGA without AD. Scheduled family training for tomorrow 8:30-9:30am. Will continue to follow pt acutely to address endurance, strengthening, balance, gait & stair negotiation.        If plan is discharge home, recommend the following: A little help with walking and/or transfers;A lot of help with bathing/dressing/bathroom;Assistance with feeding;Assistance with cooking/housework;Direct supervision/assist for medications management;Direct supervision/assist for financial management;Assist for transportation;Help with stairs or ramp for entrance   Can travel by private vehicle   Yes    Equipment Recommendations  (tub transfer bench)  Recommendations for Other Services       Functional Status Assessment Patient has had a recent  decline in their functional status and demonstrates the ability to make significant improvements in function in a reasonable and predictable amount of time.     Precautions / Restrictions Precautions Precautions: Fall;Shoulder Shoulder Interventions: Shoulder sling/immobilizer;At all times;Shoulder abduction pillow Restrictions Weight Bearing Restrictions: Yes RUE Weight Bearing: Non weight bearing LUE Weight Bearing: Non weight bearing      Mobility  Bed Mobility Overal bed mobility: Needs Assistance Bed Mobility: Supine to Sit     Supine to sit: Mod assist, HOB elevated     General bed mobility comments: Pt able to move BLE to EOB but requires assistanc eto upright trunk.    Transfers Overall transfer level: Needs assistance Equipment used: None Transfers: Sit to/from Stand Sit to Stand: Contact guard assist           General transfer comment: STS from slightly elevated EOB    Ambulation/Gait Ambulation/Gait assistance: Contact guard assist Gait Distance (Feet): 180 Feet Assistive device: None Gait Pattern/deviations: Decreased step length - right, Decreased step length - left, Decreased stride length Gait velocity: decreased     General Gait Details: 2 standing rest breaks 2/2 fatigue, SOB, no overt LOB  Stairs            Wheelchair Mobility     Tilt Bed    Modified Rankin (Stroke Patients Only)       Balance Overall balance assessment: Needs assistance Sitting-balance support: Feet supported Sitting balance-Leahy Scale: Good     Standing balance support: No upper extremity supported, During functional activity Standing balance-Leahy Scale: Fair  Pertinent Vitals/Pain Pain Assessment Pain Assessment: Faces Faces Pain Scale: Hurts a little bit Pain Location: R shoulder Pain Descriptors / Indicators: Aching Pain Intervention(s): Monitored during session    Home Living Family/patient expects  to be discharged to:: Private residence Living Arrangements: Spouse/significant other;Children Available Help at Discharge: Family;Available 24 hours/day Type of Home: House Home Access: Stairs to enter Entrance Stairs-Rails: Right;Can reach Technical sales engineer of Steps: 5-6   Home Layout: One level Home Equipment: BSC/3in1 Additional Comments: Pt lives with wife but planning to d/c to daughter's house (home set up information provided above)    Prior Function Prior Level of Function : Independent/Modified Independent             Mobility Comments: recently retired       Extremity/Trunk Assessment   Upper Extremity Assessment Upper Extremity Assessment: RUE deficits/detail;LUE deficits/detail (BUE shoulder slings)    Lower Extremity Assessment Lower Extremity Assessment: Generalized weakness       Communication      Cognition Arousal: Alert Behavior During Therapy: WFL for tasks assessed/performed Overall Cognitive Status: Within Functional Limits for tasks assessed                                          General Comments General comments (skin integrity, edema, etc.): Pt on 3L/min via nasal cannula, after gait SpO2 97%    Exercises Other Exercises Other Exercises: PT educated pt, wife & daughter on home modifications (secure/remove throw rugs) to decrease fall risk. Recommendations to use BSC, tub transfer bench, sit to urinate (vs stand), pt will require assistance for feeding & bathing 2/2 inability to use BUE at this time. Potential to sleep in recliner if necessary, how to increase seat height for increased ease of STS transfers.   Assessment/Plan    PT Assessment Patient needs continued PT services  PT Problem List Decreased strength;Decreased balance;Decreased mobility;Decreased knowledge of precautions;Pain;Decreased activity tolerance;Decreased safety awareness;Decreased knowledge of use of DME       PT Treatment  Interventions DME instruction;Gait training;Stair training;Functional mobility training;Therapeutic exercise;Balance training;Patient/family education;Modalities;Neuromuscular re-education;Therapeutic activities    PT Goals (Current goals can be found in the Care Plan section)  Acute Rehab PT Goals Patient Stated Goal: get better, go home PT Goal Formulation: With patient/family Time For Goal Achievement: 04/01/23 Potential to Achieve Goals: Good    Frequency Min 1X/week     Co-evaluation               AM-PAC PT "6 Clicks" Mobility  Outcome Measure Help needed turning from your back to your side while in a flat bed without using bedrails?: A Lot Help needed moving from lying on your back to sitting on the side of a flat bed without using bedrails?: A Lot Help needed moving to and from a bed to a chair (including a wheelchair)?: A Little Help needed standing up from a chair using your arms (e.g., wheelchair or bedside chair)?: A Little Help needed to walk in hospital room?: A Little Help needed climbing 3-5 steps with a railing? : A Little 6 Click Score: 16    End of Session Equipment Utilized During Treatment: Gait belt Activity Tolerance: Patient tolerated treatment well Patient left: in chair;with chair alarm set;with call bell/phone within reach;with family/visitor present Nurse Communication: Mobility status PT Visit Diagnosis: Unsteadiness on feet (R26.81);Other abnormalities of gait and mobility (R26.89);Muscle weakness (generalized) (M62.81);Pain Pain -  Right/Left: Right Pain - part of body: Shoulder    Time: 8295-6213 PT Time Calculation (min) (ACUTE ONLY): 37 min   Charges:   PT Evaluation $PT Re-evaluation: 1 Re-eval PT Treatments $Therapeutic Activity: 8-22 mins PT General Charges $$ ACUTE PT VISIT: 1 Visit         Aleda Grana, PT, DPT 03/18/23, 9:27 AM   Sandi Mariscal 03/18/2023, 9:25 AM

## 2023-03-18 NOTE — Inpatient Diabetes Management (Signed)
Inpatient Diabetes Program Recommendations  AACE/ADA: New Consensus Statement on Inpatient Glycemic Control (2015)  Target Ranges:  Prepandial:   less than 140 mg/dL      Peak postprandial:   less than 180 mg/dL (1-2 hours)      Critically ill patients:  140 - 180 mg/dL    Latest Reference Range & Units 03/17/23 08:16 03/17/23 11:34 03/17/23 16:38 03/17/23 17:40 03/17/23 22:19  Glucose-Capillary 70 - 99 mg/dL 098 (H)  5 units Novolog  212 (H)  6 mg Decadron @1343  283 (H) 307 (H)  8 units Novolog  308 (H)  4 units Novolog   (H): Data is abnormally high  Latest Reference Range & Units 03/18/23 08:15 03/18/23 11:45  Glucose-Capillary 70 - 99 mg/dL 119 (H) 147 (H)  12 units Novolog @1101   (H): Data is abnormally high   Home DM Meds: Metformin 1000 mg BID                             Actos 45 mg daily                             Januvia 50 mg daily   Current Orders: Novolog Moderate Correction Scale/ SSI (0-15 units) TID AC + HS                           Novolog 4 units TID with meals      MD- Note pt got Decadron X 1 dose yest at 2pm for surgery  Note AM CBGs have been elevated the last 2 days.   Home oral DM meds are on hold   May consider starting low dose basal insulin: Semglee 10 units Daily (0.1 units/kg)    --Will follow patient during hospitalization--  Ambrose Finland RN, MSN, CDCES Diabetes Coordinator Inpatient Glycemic Control Team Team Pager: [phone] (8a-5p)

## 2023-03-18 NOTE — Progress Notes (Signed)
Occupational Therapy Re-evaluation Patient Details Name: Philip Robbins MRN: [id number] DOB: 11-19-1940 Today's Date: 03/18/2023   History of present illness Pt is an 82 y/o M admitted on 03/08/23 after presenting with c/o altered mentation & hypoxia. Pt was previously diagnosed with UTI at Urgent Care. Pt was found to have sepsis, acute hypoxic respiratory failure 2/2 PNA & COPD/asthma exacerbation. Pt also found to have bilateral humeral fxs. Pt is s/p L reverse TSA & L biceps tenodesis on 03/13/23 & R reverse shoulder arthroplasty on 03/17/23. PMH: CLL, BPH, HTN, IIDM, hypothyroidism, HLD, chronic leukocytosis   OT comments  Chart reviewed to date, new orders received s/p R rTSA 03/17/23. Pt is agreeable to OT re-evaluation with wife present. MAX A continues to be required for all ADLs due to NWBing status, AROM assessed per MD protocol with education provided re: HEP. Please see below. MAX A required for sling adjustment/polar care adjustment. Discussed plan to potentially dc to daughters house where wife/daughter will assist with ADLs. Family training coordinated with PT this morning, OT will be present to provide education re: ADLs, MD protocol for B shoulder surgeries. Pt is left in bed with nurse present, OT will follow acutely.       If plan is discharge home, recommend the following:  A lot of help with walking and/or transfers;A lot of help with bathing/dressing/bathroom;Assistance with cooking/housework;Direct supervision/assist for medications management;Assist for transportation;Direct supervision/assist for financial management;Help with stairs or ramp for entrance;Assistance with feeding   Equipment Recommendations  BSC/3in1;Wheelchair (measurements OT);Tub/shower bench (tub/transfer bench for daugthers house, tub seat for pt house, they will purchase privately)    Recommendations for Other Services      Precautions / Restrictions Precautions Precautions: Fall;Shoulder Type of  Shoulder Precautions: WUJ:[id number] arm to remain in sling and abduction pillow with arm at neutral at all times except RoM exercises and hygiene. Can perform pendulums, elbow/wrist/hand RoM exercises. Can perform active elbow flexion with arm adducted (elbow by side) to allow for ADLs. Passive RoM allowed to 90 FF and 30 ER. ; NFA:OZHYQMVHQ arm to remain in sling and abduction pillow with arm at neutral at all times except RoM exercises and hygiene. Can perform pendulums, elbow/wrist/hand RoM exercises. Passive RoM allowed to 90 FF and 30 ER. Shoulder Interventions: Shoulder sling/immobilizer;At all times;Shoulder abduction pillow;Off for dressing/bathing/exercises Precaution Booklet Issued: Yes (comment) Required Braces or Orthoses: Sling Restrictions Weight Bearing Restrictions: Yes RUE Weight Bearing: Non weight bearing LUE Weight Bearing: Non weight bearing       Mobility Bed Mobility Overal bed mobility: Needs Assistance Bed Mobility: Supine to Sit     Supine to sit: Mod assist, HOB elevated Sit to supine: Max assist, HOB elevated   General bed mobility comments: assistance at trunk    Transfers Overall transfer level: Needs assistance Equipment used: None Transfers: Sit to/from Stand Sit to Stand: Contact guard assist                 Balance Overall balance assessment: Needs assistance Sitting-balance support: Feet supported Sitting balance-Leahy Scale: Good     Standing balance support: No upper extremity supported, During functional activity Standing balance-Leahy Scale: Fair                             ADL either performed or assessed with clinical judgement   ADL Overall ADL's : Needs assistance/impaired Eating/Feeding: Sitting;Maximal assistance   Grooming: Wash/dry face;Maximal assistance   Upper Body Bathing: Maximal  assistance Upper Body Bathing Details (indicate cue type and reason): simulated, lotion application     Upper Body  Dressing : Maximal assistance Upper Body Dressing Details (indicate cue type and reason): adjust slings/polarcare                        Extremity/Trunk Assessment Upper Extremity Assessment Upper Extremity Assessment: LUE deficits/detail;RUE deficits/detail RUE Deficits / Details: AROM: elbow 3/4 full AROM, wrist/hand appear WFL; RUE: Unable to fully assess due to pain;Shoulder pain with ROM (within precautions) RUE Sensation:  (appears WFL, will continue to assess) RUE Coordination: decreased fine motor;decreased gross motor LUE Deficits / Details: AROM: elbow 3/4 full AROM, wrist/hand appear WFL; LUE: Unable to fully assess due to pain;Shoulder pain with ROM (within precautions) LUE Sensation:  (appears WFL, will continue to assess)   Lower Extremity Assessment Lower Extremity Assessment: Generalized weakness   Cervical / Trunk Assessment Cervical / Trunk Assessment: Normal    Vision Patient Visual Report: No change from baseline     Perception     Praxis      Cognition Arousal: Alert Behavior During Therapy: WFL for tasks assessed/performed Overall Cognitive Status: Within Functional Limits for tasks assessed                                          Exercises Other Exercises Other Exercises: pt and wife further educated re: optimal sling positioning/polar care posittioning per orders, DME use for safe ADL completion, BUE HEP per protocol    Shoulder Instructions       General Comments      Pertinent Vitals/ Pain       Pain Assessment Pain Assessment: 0-10 Pain Score: 2  Pain Location: B shoulders Pain Descriptors / Indicators: Aching Pain Intervention(s): Monitored during session, Limited activity within patient's tolerance, Repositioned, Premedicated before session (nurse in room at end of session)  Home Living                                          Prior Functioning/Environment              Frequency   Min 1X/week        Progress Toward Goals  OT Goals(current goals can now be found in the care plan section)     Acute Rehab OT Goals Patient Stated Goal: go home OT Goal Formulation: With patient/family Time For Goal Achievement: 04/01/23 Potential to Achieve Goals: Fair  Plan      Co-evaluation                 AM-PAC OT "6 Clicks" Daily Activity     Outcome Measure   Help from another person eating meals?: A Lot Help from another person taking care of personal grooming?: A Lot Help from another person toileting, which includes using toliet, bedpan, or urinal?: A Lot Help from another person bathing (including washing, rinsing, drying)?: A Lot Help from another person to put on and taking off regular upper body clothing?: A Lot Help from another person to put on and taking off regular lower body clothing?: A Lot 6 Click Score: 12    End of Session Equipment Utilized During Treatment: Oxygen;Other (comment) (B slings)  OT Visit Diagnosis: Muscle weakness (generalized) (M62.81);Other  abnormalities of gait and mobility (R26.89)   Activity Tolerance Patient tolerated treatment well   Patient Left in bed;with call bell/phone within reach;with bed alarm set;with family/visitor present;with nursing/sitter in room   Nurse Communication Mobility status;Precautions;Weight bearing status        Time: 1308-6578 OT Time Calculation (min): 50 min  Charges: OT General Charges $OT Visit: 1 Visit OT Treatments $Self Care/Home Management : 38-52 mins  Oleta Mouse, OTD OTR/L  03/18/23, 4:25 PM

## 2023-03-18 NOTE — Anesthesia Postprocedure Evaluation (Signed)
Anesthesia Post Note  Patient: Philip Robbins  Procedure(s) Performed: REVERSE SHOULDER ARTHROPLASTY (Left: Shoulder)  Patient location during evaluation: PACU Anesthesia Type: General Level of consciousness: awake and alert Pain management: pain level controlled Vital Signs Assessment: post-procedure vital signs reviewed and stable Respiratory status: spontaneous breathing, nonlabored ventilation, respiratory function stable and patient connected to nasal cannula oxygen Cardiovascular status: blood pressure returned to baseline and stable Postop Assessment: no apparent nausea or vomiting Anesthetic complications: no   No notable events documented.   Last Vitals:  Vitals:   03/17/23 2345 03/18/23 0538  BP: (!) 146/49 (!) 160/50  Pulse: 94 91  Resp: 18 18  Temp: 36.8 C 36.7 C  SpO2: 92% 95%    Last Pain:  Vitals:   03/18/23 0538  TempSrc: Oral  PainSc:                  Stephanie Coup

## 2023-03-18 NOTE — Progress Notes (Signed)
  Subjective: 1 Day Post-Op Procedure(s) (LRB): REVERSE SHOULDER ARTHROPLASTY (Right) Patient reports pain as mild.  Right shoulder doing well since surgery. Patient is well, and has had no acute complaints or problems Plan is to go Rehab after hospital stay. Negative for chest pain and shortness of breath Fever: no Gastrointestinal: Negative for nausea and vomiting  Objective: Vital signs in last 24 hours: Temp:  [97.2 F (36.2 C)-98.5 F (36.9 C)] 98.1 F (36.7 C) (09/04 0538) Pulse Rate:  [83-102] 91 (09/04 0538) Resp:  [13-22] 18 (09/04 0538) BP: (117-160)/(32-60) 160/50 (09/04 0538) SpO2:  [84 %-95 %] 95 % (09/04 0538) Weight:  [104.6 kg] 104.6 kg (09/03 0700)  Intake/Output from previous day:  Intake/Output Summary (Last 24 hours) at 03/18/2023 0658 Last data filed at 03/18/2023 0600 Gross per 24 hour  Intake 1400 ml  Output 1796 ml  Net -396 ml    Intake/Output this shift: Total I/O In: -  Out: 1330 [Urine:1300; Drains:30]  Labs: Recent Labs    03/16/23 0437 03/17/23 0314  HGB 9.0* 8.8*   Recent Labs    03/16/23 0437 03/17/23 0314  WBC 61.9* 58.3*  RBC 3.12* 3.01*  HCT 27.4* 26.5*  PLT 397 410*   Recent Labs    03/16/23 0437 03/17/23 0314  NA 130* 128*  K 3.8 3.7  CL 97* 93*  CO2 27 27  BUN 16 16  CREATININE 0.68 0.74  GLUCOSE 185* 254*  CALCIUM 8.1* 8.1*   No results for input(s): "LABPT", "INR" in the last 72 hours.   EXAM General - Patient is Alert and Oriented Extremity - Neurovascular intact Sensation intact distally Dorsiflexion/Plantar flexion intact Shoulder immobilizer in place. Dressing/Incision - clean, dry, with the Hemovac removed with no complication. Motor Function - intact, moving fingers and wrist well on exam.   Past Medical History:  Diagnosis Date   Diabetes type 2, controlled (HCC)    Hypothyroid    UTI (urinary tract infection)     Assessment/Plan: 1 Day Post-Op Procedure(s) (LRB): REVERSE SHOULDER  ARTHROPLASTY (Right) Principal Problem:   Pneumonia Active Problems:   CAP (community acquired pneumonia)   Sepsis (HCC)   Hyponatremia  Estimated body mass index is 35.58 kg/m as calculated from the following:   Height as of this encounter: 5' 7.5" (1.715 m).   Weight as of this encounter: 104.6 kg. Advance diet Up with therapy  Discharge planning: Follow-up with [facility] clinic orthopedics in 2 weeks for staple removal and x-rays of both the right and the left shoulder.  Bandage change as needed.  No showering.  DVT Prophylaxis - Aspirin Shoulder immobilizer on both shoulders at all times.  Remove for bathing and physical therapy.  Dedra Skeens, PA-C Orthopaedic Surgery 03/18/2023, 6:58 AM

## 2023-03-18 NOTE — TOC Progression Note (Signed)
Transition of Care [facility]) - Progression Note    Patient Details  Name: Philip Robbins MRN: [id number] Date of Birth: 1940-09-10  Transition of Care [facility]) CM/SW Contact  Marlowe Sax, RN Phone Number: 03/18/2023, 3:05 PM  Clinical Narrative:    Met with the patient and his wife in the room, he plans to DC to his daughter's home down the road from is home, They have a 3 in 1 at home, he understands that the recommendation is STR SNF, he understands that people tend to do better t home  He does not have a preference for Home health, [facility] has accepted the patient His wife and daughter will provide transportation They are purchasing a Futures trader from either [employer] or a DME company as Community education officer does not cover A [employer] is scheduled tomorrow at 830 for training  Expected Discharge Plan: Home w Home Health Services Barriers to Discharge: Continued Medical Work up  Expected Discharge Plan and Services   Discharge Planning Services: CM Consult Post Acute Care Choice: Home Health Living arrangements for the past 2 months: Single Family Home                   DME Agency: NA       HH Arranged: PT, OT HH Agency: [facility] Date HH Agency Contacted: 03/18/23 Time HH Agency Contacted: 1505 Representative spoke with at [facility] Agency: [place]   Social Determinants of Health (SDOH) Interventions SDOH Screenings   Food Insecurity: No Food Insecurity (03/10/2023)  Housing: Low Risk  (03/10/2023)  Transportation Needs: No Transportation Needs (03/10/2023)  Utilities: Not At Risk (03/10/2023)  Tobacco Use: Low Risk  (03/13/2023)    Readmission Risk Interventions     No data to display

## 2023-03-18 NOTE — Consult Note (Signed)
PHARMACY CONSULT NOTE - ELECTROLYTES  Pharmacy Consult for Electrolyte Monitoring and Replacement   Recent Labs: Potassium (mmol/L)  Date Value  03/18/2023 4.3   Magnesium (mg/dL)  Date Value  40/98/1191 1.8   Calcium (mg/dL)  Date Value  47/82/9562 7.9 (L)   Albumin (g/dL)  Date Value  13/02/6577 2.3 (L)   Phosphorus (mg/dL)  Date Value  46/96/2952 2.5   Sodium (mmol/L)  Date Value  03/18/2023 128 (L)   Corrected Calcium: 9.3  Height: 5' 7.5" (171.5 cm) Weight: 104.6 kg (230 lb 9.6 oz) IBW/kg (Calculated) : 67.25 Estimated Creatinine Clearance: 85.6 mL/min (by C-G formula based on SCr of 0.67 mg/dL).  Assessment  Philip Robbins is a 82 y.o. male presenting with pneumonia and hyponatremia. PMH significant for CLL, BPH, HTN, DM, hypothyroidism, HLD. Pharmacy has been consulted to monitor and replace electrolytes. -on lisinopril  Goal of Therapy: Electrolytes within normal limits  Plan:  Mag 1.8  Will order Magnesium sulfate 2 gm IV x1 Follow-up electrolytes with AM labs tomorrow  Thank you for allowing pharmacy to be a part of this patient's care.  Bari Mantis PharmD Clinical Pharmacist 03/18/2023

## 2023-03-18 NOTE — Discharge Instructions (Signed)
Philip H. Patel, MD  [facility]  Phone: [phone]  Fax: [phone]   Discharge Instructions after Reverse Shoulder Replacement    1. Activity/Sling: You are to be non-weight bearing on operative extremity. A sling/shoulder immobilizer has been provided for you. Only remove the sling to perform elbow, wrist, and hand RoM exercises and hygiene/dressing. Active reaching and lifting are not permitted. You will be given further instructions on sling use at your first physical therapy visit and postoperative visit with Dr. Patel.   2. Dressings: Dressing may be removed at 1st physical therapy visit (~3-4 days after surgery). Afterwards, you may either leave open to air (if no drainage) or cover with dry, sterile dressing. If you have steri-strips on your wound, please do not remove them. They will fall off on their own. You may shower 5 days after surgery. Please pat incision dry. Do not rub or place any shear forces across incision. If there is drainage or any opening of incision after 5 days, please notify our offices immediately.    3. Driving:  Plan on not driving for six weeks. Please note that you are advised NOT to drive while taking narcotic pain medications as you may be impaired and unsafe to drive.   4. Medications:  - You have been provided a prescription for narcotic pain medicine (usually oxycodone). After surgery, take 1-2 narcotic tablets every 4 hours if needed for severe pain. Please start this as soon as you begin to start having pain (if you received a nerve block, start taking as soon as this wears off).  - A prescription for anti-nausea medication will be provided in case the narcotic medicine causes nausea - take 1 tablet every 6 hours only if nauseated.  - Take enteric coated aspirin 325 mg once daily for 6 weeks to prevent blood clots. Do not take aspirin if you have an aspirin sensitivity/allergy or asthma or are on an anticoagulant (blood thinner) already. If so, then  your home anticoagulant will be resume and managed - do not take aspirin. -Take tylenol 1000mg (2 Extra strength or 3 regular strength tablets) every 8 hours for pain. This will reduce the amount of narcotic medication needed. May stop tylenol when you are having minimal pain. - Take a stool softener (Colace, Dulcolax or Senakot) if you are using narcotic pain medications to help with constipation that is associated with narcotic use. - DO NOT take ANY nonsteroidal anti-inflammatory pain medications: Advil, Motrin, Ibuprofen, Aleve, Naproxen, or Naprosyn.   If you are taking prescription medication for anxiety, depression, insomnia, muscle spasm, chronic pain, or for attention deficit disorder you are advised that you are at a higher risk of adverse effects with use of narcotics post-op, including narcotic addiction/dependence, depressed breathing, death. If you use non-prescribed substances: alcohol, marijuana, cocaine, heroin, methamphetamines, etc., you are at a higher risk of adverse effects with use of narcotics post-op, including narcotic addiction/dependence, depressed breathing, death. You are advised that taking > 50 morphine milligram equivalents (MME) of narcotic pain medication per day results in twice the risk of overdose or death. For your prescription provided: oxycodone 5 mg - taking more than 6 tablets per day after the first few days of surgery.   5. Physical Therapy: 1-2 times per week for ~12 weeks. Therapy typically starts on post operative Day 3 or 4. You have been provided an order for physical therapy. The therapist will provide home exercises. Please contact our offices if this appointment has not been scheduled.      6. Work: May do light duty/desk job in approximately 2 weeks when off of narcotics, pain is well-controlled, and swelling has decreased if able to function with one arm in sling. Full work may take 6 weeks if light motions and function of both arms is required.  Lifting jobs may require 12 weeks.   7. Post-Op Appointments: Your first post-op appointment will be with Dr. Patel in approximately 2 weeks time.    If you find that they have not been scheduled please call the Orthopaedic Appointment front desk at [phone].                               Philip H. Patel, MD [facility] Phone: [phone] Fax: [phone]   REVERSE SHOULDER ARTHROPLASTY REHAB GUIDELINES   These guidelines should be tailored to individual patients based on their rehab goals, age, precautions, quality of repair, etc.  Progression should be based on patient progress and approval by the referring physician.  PHASE 1 - Day 1 through Week 2  GENERAL GUIDELINES AND PRECAUTIONS Sling wear 24/7 except during grooming and home exercises (3 to 5 times daily) Avoid shoulder extension such that the arm is posterior the frontal plane.  When patients recline, a pillow should be placed behind the upper arm and sling should be on.  They should be advised to always be able to see the elbow Avoid combined IR/ADD/EXT, such as hand behind back to prevent dislocation Avoid combined IR and ADD such as reaching across the chest to prevent dislocation No AROM No submersion in pool/water for 4 weeks No weight bearing through operative arm (as in transfers, walker use, etc.)  GOALS Maintain integrity of joint replacement; protect soft tissue healing Increase PROM for elevation to 120 and ER to 30 (will remain the goal for first 6 weeks) Optimize distal UE circulation and muscle activity (elbow, wrist and hand) Instruct in use of sling for proper fit, polar care device for ice application after HEP, signs/symptoms of infection  EXERCISES Active elbow, wrist and hand Passive forward elevation in scapular plane to 90-120 max motion; ER in scapular plane to 30 Active scapular retraction with arms resting in neutral position  CRITERIA TO PROGRESS TO  PHASE 2 Low pain (less than 3/10) with shoulder PROM Healing of incision without signs of infection Clearance by MD to advance after 2 week MD check up  PHASE 2 - 2 weeks - 6 weeks  GENERAL GUIDELINES AND PRECAUTIONS Sling may be removed while at home; worn in community without abduction pillow May use arm for light activities of daily living (such as feeding, brushing teeth, dressing.) with elbow near  the side of the body  and arm in front of the body- no active lifting of the arm May submerge in water (tub, pool, Jacuzzi, etc.) after 4 weeks Continue to avoid WBing through the operative arm Continue to avoid combined IR/EXT/ADD (hand behind the back) and IR/ADD  (reaching across chest) for dislocation precautions  GOALS  Achieve passive elevation to 120 and ER to 30  Low (less than 3/10) to no pain  Ability to fire all heads of the deltoid  EXERCISES May discontinue grip, and active elbow and wrist exercises since using the arm in ADL's  with sling removed around the home Continue passive elevation to 120 and ER to 30, both in scapular plane with arm supported on table top Add submaximal isometrics, pain free effort, for all   functional heads of deltoid (anterior, posterior, middle)  Ensure that with posterior deltoid isometric the shoulder does not move into extension and the arm remains anterior the frontal plane At 4 weeks:  begin to place arm in balanced position of 90 deg elevation in supine; when patient able to hold this position with ease, may begin reverse pendulums clockwise and counterclockwise  CRITERIA TO PROGRESS TO PHASE 3 Passive forward elevation in scapular plane to 120; passive ER in scapular plane to 30 Ability to fire isometrically all heads of the deltoid muscle without pain Ability to place and hold the arm in balanced position (90 deg elevation in supine)  PHASE 3 - 6 weeks to 3 months  GENERAL GUIDELINES AND PRECAUTIONS Discontinue use of sling Avoid  forcing end range motion in any direction to prevent dislocation  May advance use of the arm actively in ADL's without being restricted to arm by the side of the body, however, avoid heavy lifting and sports (forever!) May initiate functional IR behind the back gently NO UPPER BODY ERGOMETER   GOALS Optimize PROM for elevation and ER in scapular plane with realistic expectation that max  mobility for elevation is usually around 145-160 passively; ER 40 to 50 passively; functional IR to L1 Recover AROM to approach as close to PROM available as possible; may expect 135-150 deg active elevation; 30 deg active ER; active functional IR to L1 Establish dynamic stability of the shoulder with deltoid and periscapular muscle gradual strengthening  EXERCISES Forward elevation in scapular plane active progression: supine to incline, to vertical; short to long lever arm Balanced position long lever arm AROM Active ER/IR with arm at side Scapular retraction with light band resistance Functional IR with hand slide up back - very gentle and gradual NO UPPER BODY ERGOMETER     CRITERIA TO PROGRESS TO PHASE 4  AROM equals/approaches PROM with good mechanics for elevation   No pain  Higher level demand on shoulder than ADL functions   PHASE 4 12 months and beyond  GENERAL GUIDELINES AND PRECAUTIONS No heavy lifting and no overhead sports No heavy pushing activity Gradually increase strength of deltoid and scapular stabilizers; also the rotator cuff if present with weights not to exceed 5 lbs NO UPPER BODY ERGOMETER   GOALS  Optimize functional use of the operative UE to meet the desired demands  Gradual increase in deltoid, scapular muscle, and rotator cuff strength  Pain free functional activities   EXERCISES Add light hand weights for deltoid up to and not to exceed 3 lbs for anterior and posterior with long arm lift against gravity; elbow bent to 90 deg for abduction in scapular  plane Theraband progression for extension to hip with scapular depression/retraction Theraband progression for serratus anterior punches in supine; avoid wall, incline or prone pressups for serratus anterior End range stretching gently without forceful overpressure in all planes (elevation in scapular plane, ER in scapular plane, functional IR) with stretching done for life as part of a daily routine NO UPPER BODY ERGOMETER     CRITERIA FOR DISCHARGE FROM SKILLED PHYSICAL THERAPY  Pain free AROM for shoulder elevation (expect around 135-150)  Functional strength for all ADL's, work tasks, and hobbies approved by surgeon  Independence with home maintenance program   NOTES: 1. With proper exercise, motion, strength, and function continue to improve even after one year. 2. The complication rate after surgery is 5 - 8%. Complications include infection, fracture, heterotopic bone formation, nerve injury, instability, rotator cuff   tear, and tuberosity nonunion. Please look for clinical signs, unusual symptoms, or lack of progress with therapy and report those to Dr. Patel. Prefer more communication than less.  3. The therapy plan above only serves as a guide. Please be aware of specific individualized patient instructions as written on the prescription or through discussions with the surgeon. 4. Please call Dr. Patel if you have any specific questions or concerns [phone]    

## 2023-03-18 NOTE — Progress Notes (Signed)
Progress Note   Patient: Philip Robbins EXB:[id number] DOB: March 01, 1941 DOA: 03/08/2023     10 DOS: the patient was seen and examined on 03/18/2023   Brief hospital course: Philip Robbins is a 82 y.o. male with medical history significant of CLL under surveillance, BPH, HTN, IIDM, hypothyroidism, HLD, chronic leukocytosis presented with altered mentation, hypoxia. He was admitted for severe sepsis, respiratory failure, pna, electrolyte abnormalities, noted to have b/l humeral fractures, s/p repair by ortho team. He is awaiting safe discharge plan.  Assessment and Plan: Bilateral Humeral fracture No h/o fall, possible trauma while transportation as pt was combative and he was held strongly by EMS.  CT left shoulder shows dislocation, fracture and hemarthrosis 8/30 s/p  Left reverse total shoulder arthroplasty,  Left biceps tenodesis and Right closed management of proximal humerus fracture  8/30 CT right shoulder consistent with dislocation and fracture 9/3 s/p right reverse total shoulder arthroplasty Follow orthopedic surgery for further recommendation on discharge. He worked with PT, recommended HH services.   Sepsis, severe, Resolved Evidenced by hypoxia, elevated temperature and elevated lactic acid with signs of endorgan damage of acute encephalopathy and source of infection likely aspiration pneumonia and UTI.  S/p ceftriaxone and azithromycin to cover pneumonia and UTI for 5 days.  Finished antibiotic course.   Acute hypoxic respiratory failure secondary to pneumonia and COPD/asthma exacerbation  Patient does not have diagnosis of COPD or asthma Patient was wheezing, resolved on 8/28 Got prednisone 40 mg p.o. daily for 3 days Continue Breo Ellipta inhaler and DuoNeb TID Continue supplemental O2 inhalation as needed Also got a trial of Lasix 40 mg IV twice daily for 4 days   Aspiration pneumonia Continue aspiration precautions. Finished antibiotic therapy.   UTI secondary to  uncontrolled BPH, s/p Antibiotics Outpatient follow-up with urology for urodynamic study Continue Flomax   Acute hypotonic hyponatremia Hypovolemic, likely secondary to acute GI loss from repeated vomiting overnight and sepsis Serum osmolality 257 low, TSH 2.6 within normal range Nephro consulted, s/p saline 3% IVF discontinued on 8/27 Na stable at 128. Continue to trend. Continue fluid restriction 1.5 L/day   Hypophosphatemia, Phos repleted.  Resolved Hypomagnesemia, mag repleted.  Resolved Monitor electrolytes daily.  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  Repeat sodium level and likely will start hypertonic saline, consult nephrology. Seizure precautions   Troponin elevation No chest pains, EKG showed no acute ST changes and the poor R wave progression appears to be chronic. Etiology likely related to sepsis and acute hypoxia and demanding ischemia.  Treat sepsis, repeat troponin this evening.   CLL with chronic leukocytosis Peripheral smear is being reviewed to rule out acute transformation Management of sepsis as above 8/29 WBC count is significantly elevated, could be due to steroids.  Steroids tapered off. Thrombocytosis, continue to monitor Monitor CBC daily   Hypertension Blood pressure improved Continue lisinopril 20 mg p.o. daily Monitor BP and titrate medications accordingly Continue hydralazine prn   Type 2 diabetes, HbA1c 6.7, well-controlled Hold off metformin Accucheks, sliding scale.   Hypothyroid, continue Synthroid   Vitamin D Insufficiency: continue vitamin D 50,000 units p.o. weekly, follow with PCP to repeat vitamin D level after 3 to 6 months.   Vitamin B12 deficiency:  B12 1000 mcg oral supplement, avoided IM injection due to bilateral humeral fractures Follow-up PCP to repeat vitamin B12 level after 3 to 6 months.   Iron  deficiency,  transferrin saturation 6%, started oral iron supplement.  Avoided IV iron due to active infection. Follow-up with PCP to repeat iron profile after 3 to 6 months Folic acid level 8.3, continue oral supplement for 3 months.   Obesity Body mass index is 35.58 kg/m.  Diet, exercise and weight reduction advised.   Diet: Heart healthy diet DVT Prophylaxis: Subcutaneous Lovenox        Subjective: Patient is seen and examined today morning. He is sitting in chair, bilateral sling noted. States he worked with PT and walked. Wife at bedside. He is eating fair.  Physical Exam: Vitals:   03/18/23 0538 03/18/23 0837 03/18/23 1148 03/18/23 1543  BP: (!) 160/50 (!) 146/65 (!) 155/47 (!) 137/43  Pulse: 91 85 93 92  Resp: 18 18 18 20   Temp: 98.1 F (36.7 C) 97.9 F (36.6 C) 98.2 F (36.8 C) 99.4 F (37.4 C)  TempSrc: Oral     SpO2: 95% 94% 91% (!) 87%  Weight:      Height:       General -elderly obese Caucasian male, no apparent distress HEENT - PERRLA, EOMI, atraumatic head, non tender sinuses. Lung -bibasilar Rales Heart - S1, S2 heard, no murmurs, rubs, 1+ pedal edema Neuro - Alert, awake and oriented x 3, non focal exam. Skin - Warm and dry. Bilateral arm slings noted. Data Reviewed:     Latest Ref Rng & Units 03/18/2023    8:01 AM 03/17/2023    3:14 AM 03/16/2023    4:37 AM  CBC  WBC 4.0 - 10.5 K/uL 56.6  58.3  61.9   Hemoglobin 13.0 - 17.0 g/dL 57.8  8.8  9.0   Hematocrit 39.0 - 52.0 % 30.8  26.5  27.4   Platelets 150 - 400 K/uL 367  410  397        Latest Ref Rng & Units 03/18/2023    8:01 AM 03/17/2023    3:14 AM 03/16/2023    4:37 AM  BMP  Glucose 70 - 99 mg/dL 469  629  528   BUN 8 - 23 mg/dL 16  16  16    Creatinine 0.61 - 1.24 mg/dL 4.13  2.44  0.10   Sodium 135 - 145 mmol/L 128  128  130   Potassium 3.5 - 5.1 mmol/L 4.3  3.7  3.8   Chloride 98 - 111 mmol/L 96  93  97   CO2 22 - 32 mmol/L 24  27  27    Calcium 8.9 - 10.3 mg/dL 7.9  8.1  8.1    DG Shoulder 1V  Right  Result Date: 03/17/2023 CLINICAL DATA:  Status post reverse total right shoulder arthroplasty today. EXAM: RIGHT SHOULDER - 1 VIEW COMPARISON:  CT right shoulder 03/13/2023 FINDINGS: Interval reverse total right shoulder arthroplasty. No perihardware lucency is seen to indicate hardware failure or loosening. Expected postoperative subcutaneous and subacromial/subdeltoid bursal air. Lateral surgical skin staples. No acute fracture or dislocation. IMPRESSION: Interval reverse total right shoulder arthroplasty without evidence of hardware failure or loosening. Electronically Signed   By: Neita Garnet M.D.   On: 03/17/2023 19:02   DG Shoulder 1V Left  Result Date: 03/17/2023 CLINICAL DATA:  Status post reverse total left arthroplasty performed last week. EXAM: LEFT SHOULDER COMPARISON:  Left shoulder radiographs 03/13/2023, CT left shoulder FINDINGS: Redemonstration of reverse total left shoulder arthroplasty. On frontal view there is normal alignment. No perihardware lucency to indicate hardware failure or loosening. Mild cortical step-off at the inferolateral  left humeral head corresponding to residua of the acute fracture seen on prior 03/10/2023 CT. Mild acromioclavicular joint space narrowing and peripheral osteophytosis. Mild superior left shoulder subcutaneous air is again seen. There are superolateral left shoulder surgical skin staples. IMPRESSION: Status post reverse total left shoulder arthroplasty without evidence of hardware failure or loosening. Electronically Signed   By: Neita Garnet M.D.   On: 03/17/2023 18:08     Family Communication: Wife at bedside understand and agree with current plan.  Disposition: Status is: Inpatient Remains inpatient appropriate because: safe discharge plan  Planned Discharge Destination: Home with Home Health    Time spent: 44 minutes  Author: Marcelino Duster, MD 03/18/2023 7:42 PM  For on call review [email or web address].

## 2023-03-19 ENCOUNTER — Inpatient Hospital Stay: Payer: Medicare Other

## 2023-03-19 ENCOUNTER — Encounter: Payer: Self-pay | Admitting: Orthopedic Surgery

## 2023-03-19 DIAGNOSIS — J9601 Acute respiratory failure with hypoxia: Secondary | ICD-10-CM | POA: Diagnosis not present

## 2023-03-19 DIAGNOSIS — S42301A Unspecified fracture of shaft of humerus, right arm, initial encounter for closed fracture: Secondary | ICD-10-CM | POA: Diagnosis not present

## 2023-03-19 DIAGNOSIS — A419 Sepsis, unspecified organism: Secondary | ICD-10-CM | POA: Diagnosis not present

## 2023-03-19 LAB — BASIC METABOLIC PANEL
Anion gap: 9 (ref 5–15)
BUN: 11 mg/dL (ref 8–23)
CO2: 25 mmol/L (ref 22–32)
Calcium: 7.7 mg/dL — ABNORMAL LOW (ref 8.9–10.3)
Chloride: 95 mmol/L — ABNORMAL LOW (ref 98–111)
Creatinine, Ser: 0.66 mg/dL (ref 0.61–1.24)
GFR, Estimated: >60 mL/min (ref >60)
Glucose, Bld: 242 mg/dL — ABNORMAL HIGH (ref 70–99)
Potassium: 4 mmol/L (ref 3.5–5.1)
Sodium: 129 mmol/L — ABNORMAL LOW (ref 135–145)

## 2023-03-19 LAB — GLUCOSE, CAPILLARY
Glucose-Capillary: 226 mg/dL — ABNORMAL HIGH (ref 70–99)
Glucose-Capillary: 204 mg/dL — ABNORMAL HIGH (ref 70–99)
Glucose-Capillary: 220 mg/dL — ABNORMAL HIGH (ref 70–99)
Glucose-Capillary: 213 mg/dL — ABNORMAL HIGH (ref 70–99)

## 2023-03-19 LAB — MAGNESIUM: Magnesium: 1.6 mg/dL — ABNORMAL LOW (ref 1.7–2.4)

## 2023-03-19 MED ORDER — MAGNESIUM SULFATE 2 GM/50ML IV SOLN
2.0000 g | Freq: Once | INTRAVENOUS | Status: AC
  Administered 2023-03-19: 2 g via INTRAVENOUS
  Filled 2023-03-19: qty 50

## 2023-03-19 MED ORDER — INSULIN GLARGINE-YFGN 100 UNIT/ML ~~LOC~~ SOLN
10.0000 [IU] | Freq: Every day | SUBCUTANEOUS | Status: DC
  Administered 2023-03-19: 10 [IU] via SUBCUTANEOUS
  Filled 2023-03-19 (×2): qty 0.1

## 2023-03-19 MED ORDER — ATORVASTATIN CALCIUM 20 MG PO TABS
40.0000 mg | ORAL_TABLET | Freq: Every day | ORAL | Status: DC
  Administered 2023-03-19 – 2023-03-20 (×2): 40 mg via ORAL
  Filled 2023-03-19 (×2): qty 2

## 2023-03-19 MED ORDER — FUROSEMIDE 40 MG PO TABS
40.0000 mg | ORAL_TABLET | Freq: Every day | ORAL | Status: DC
  Administered 2023-03-19 – 2023-03-20 (×2): 40 mg via ORAL
  Filled 2023-03-19 (×2): qty 1

## 2023-03-19 MED ORDER — GUAIFENESIN ER 600 MG PO TB12: 600.0000 mg | ORAL_TABLET | Freq: Two times a day (BID) | ORAL | Status: DC | PRN

## 2023-03-19 NOTE — Care Management Important Message (Signed)
Important Message  Patient Details  Name: JOSHAUN NANNINI MRN: [id number] Date of Birth: August 07, 1940   Medicare Important Message Given:  Yes     Olegario Messier A Makita Blow 03/19/2023, 12:55 PM

## 2023-03-19 NOTE — Progress Notes (Signed)
Physical Therapy Treatment Patient Details Name: Philip Robbins MRN: [id number] DOB: [birth year] Today's Date: 03/19/2023   History of Present Illness Pt is an 82 y/o M admitted on 03/08/23 after presenting with c/o altered mentation & hypoxia. Pt was previously diagnosed with UTI at Urgent Care. Pt was found to have sepsis, acute hypoxic respiratory failure 2/2 PNA & COPD/asthma exacerbation. Pt also found to have bilateral humeral fxs. Pt is s/p L reverse TSA & L biceps tenodesis on 03/13/23 & R reverse shoulder arthroplasty on 03/17/23. PMH: CLL, BPH, HTN, IIDM, hypothyroidism, HLD, chronic leukocytosis    PT Comments  PT/OT co-treat this AM for pt/family training. Pt's supportive spouse at bedside with one of pt's daughter. Pt's spouse struggles to perform task due to her severity of OA in hands. A lot of time spent educating family on proper application and removal of BUE slings/polar care. Importance of positioning and what pt is currently allowed/recommended with ROM in [place].  Pt remains NWB BUE and will be dependent on family to perform task due to this. Family voiced concerns about Dcing straight home and do not feel they can provide level of assistance pt currently requires.  He requires extensive assistance to exit bed and stand at EOB. Deferred gait training till later in the day due to elevated BP and c/o severe dizziness. DC recs updated for appropriate DC disposition.    If plan is discharge home, recommend the following: A little help with walking and/or transfers;A lot of help with bathing/dressing/bathroom;Assistance with feeding;Assistance with cooking/housework;Direct supervision/assist for medications management;Direct supervision/assist for financial management;Assist for transportation;Help with stairs or ramp for entrance     Equipment Recommendations  None recommended by PT       Precautions / Restrictions Precautions Precautions: Fall;Shoulder Type of Shoulder Precautions:  QIO:[id number] arm to remain in sling and abduction pillow with arm at neutral at all times except RoM exercises and hygiene. Can perform pendulums, elbow/wrist/hand RoM exercises. Can perform active elbow flexion with arm adducted (elbow by side) to allow for ADLs. Passive RoM allowed to 90 FF and 30 ER. ; LKG:[id number] arm to remain in sling and abduction pillow with arm at neutral at all times except RoM exercises and hygiene. Can perform pendulums, elbow/wrist/hand RoM exercises. Passive RoM allowed to 90 FF and 30 ER. Shoulder Interventions: Shoulder sling/immobilizer;At all times;Shoulder abduction pillow;Off for dressing/bathing/exercises Precaution Booklet Issued: Yes (comment) Precaution Comments: S/P BUE TSA Required Braces or Orthoses: Sling Restrictions Weight Bearing Restrictions: Yes RUE Weight Bearing: Non weight bearing LUE Weight Bearing: Non weight bearing     Mobility  Bed Mobility Overal bed mobility: Needs Assistance Bed Mobility: Supine to Sit  Supine to sit: Mod assist, HOB elevated Sit to supine: Max assist, HOB elevated General bed mobility comments: pt continues to require extensive assistance to safely exit bed, Family training for all mobility, transfer, and equipment    Transfers Overall transfer level: Needs assistance Equipment used: None Transfers: Sit to/from Stand Sit to Stand: Contact guard assist  General transfer comment: CGA for safety. pt c/o dizziness and lightheaded sensation in standing. BP elevated. RN in room at conclusion of session with BP medications being administered. will retunr in PM to address gait.    Ambulation/Gait  General Gait Details: deferred to afternoon   Balance Overall balance assessment: Needs assistance Sitting-balance support: Feet supported Sitting balance-Leahy Scale: Good  Standing balance support: No upper extremity supported, During functional activity Standing balance-Leahy Scale: Fair       Cognition  Arousal: Alert, Suspect due to medications Behavior During Therapy: WFL for tasks assessed/performed Overall Cognitive Status: Within Functional Limits for tasks assessed    General Comments: Pt states several times that he is groggy and loopy due to pain meds. Pt was able to follow commands consistently           General Comments General comments (skin integrity, edema, etc.): Discussed at length post acute care needs, proper sling application and polar care use.      Pertinent Vitals/Pain Pain Assessment Pain Assessment: 0-10 Pain Score: 2  Pain Intervention(s): Limited activity within patient's tolerance, Monitored during session, Premedicated before session, Repositioned     PT Goals (current goals can now be found in the care plan section) Acute Rehab PT Goals Patient Stated Goal: get better, go home Progress towards PT goals: Progressing toward goals    Frequency    Min 1X/week       Co-evaluation   Reason for Co-Treatment: Complexity of the patient's impairments (multi-system involvement);Necessary to address cognition/behavior during functional activity;For patient/therapist safety;To address functional/ADL transfers   OT goals addressed during session: ADL's and self-care      AM-PAC PT "6 Clicks" Mobility   Outcome Measure  Help needed turning from your back to your side while in a flat bed without using bedrails?: A Lot Help needed moving from lying on your back to sitting on the side of a flat bed without using bedrails?: A Lot Help needed moving to and from a bed to a chair (including a wheelchair)?: A Little Help needed standing up from a chair using your arms (e.g., wheelchair or bedside chair)?: A Little Help needed to walk in hospital room?: A Little Help needed climbing 3-5 steps with a railing? : A Little 6 Click Score: 16    End of Session Equipment Utilized During Treatment: Gait belt Activity Tolerance: Patient tolerated treatment  well Patient left: in bed;with call bell/phone within reach;with bed alarm set Nurse Communication: Mobility status PT Visit Diagnosis: Unsteadiness on feet (R26.81);Other abnormalities of gait and mobility (R26.89);Muscle weakness (generalized) (M62.81);Pain Pain - Right/Left: Right Pain - part of body: Shoulder     Time: 2536-6440 PT Time Calculation (min) (ACUTE ONLY): 54 min  Charges:    $Therapeutic Activity: 23-37 mins PT General Charges $$ ACUTE PT VISIT: 1 Visit                    Jetta Lout PTA 03/19/23, 1:26 PM

## 2023-03-19 NOTE — Progress Notes (Signed)
Progress Note   Patient: Philip Robbins WUX:[id number] DOB: [birth year] DOA: 03/08/2023     11 DOS: the patient was seen and examined on 03/19/2023   Brief hospital course: Philip Robbins is a 82 y.o. male with medical history significant of CLL under surveillance, BPH, HTN, IIDM, hypothyroidism, HLD, chronic leukocytosis presented with altered mentation, hypoxia. He was admitted for severe sepsis, respiratory failure, pna, electrolyte abnormalities, noted to have b/l humeral fractures, s/p repair by ortho team. He is awaiting safe discharge plan.  Assessment and Plan: Bilateral Humeral fracture From possible trauma while transportation as pt was combative and he was held strongly by EMS.  CT left shoulder shows dislocation, fracture and hemarthrosis 8/30 s/p  Left reverse total shoulder arthroplasty,  Left biceps tenodesis and Right closed management of proximal humerus fracture  8/30 CT right shoulder consistent with dislocation and fracture 9/3 s/p right reverse total shoulder arthroplasty Follow orthopedic surgery for further recommendation on discharge. He worked with PT, recommended HH services.   Sepsis, severe, Resolved Evidenced by hypoxia, elevated temperature and elevated lactic acid with signs of endorgan damage of acute encephalopathy and source of infection likely aspiration pneumonia and UTI.  S/p ceftriaxone and azithromycin to cover pneumonia and UTI for 5 days.  Finished antibiotic course.   Acute hypoxic respiratory failure secondary to pneumonia and COPD/asthma exacerbation  Patient does not have diagnosis of COPD or asthma Patient was wheezing, resolved on 8/28 Got prednisone 40 mg p.o. daily for 3 days Continue Breo Ellipta inhaler and DuoNeb TID Continue supplemental O2 inhalation as needed Also got a trial of Lasix 40 mg IV twice daily for 4 days. He does have leg swelling, currently on 3L supplemental o2.  Will give Lasix 40 daily. Chest xray, echo ordered.    Aspiration pneumonia Continue aspiration precautions. Finished antibiotic therapy.   UTI Finished antibiotics Outpatient follow-up with urology for urodynamic study Continue Flomax   Acute hypotonic hyponatremia Hypovolemic, likely secondary to acute GI loss from repeated vomiting overnight and sepsis Serum osmolality 257 low, TSH 2.6 within normal range Nephro consulted, s/p saline 3% IVF discontinued on 8/27 Na stable at 129. Continue to trend. Continue fluid restriction 1.5 L/day   Hypophosphatemia, Phos repleted.  Resolved Hypomagnesemia, mag sulfate ordered per pharmacy. 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  Seizure precautions   Troponin elevation No chest pains, EKG showed no acute ST changes and the poor R wave progression appears to be chronic. Etiology likely related to sepsis and acute hypoxia and demanding ischemia.  Treat sepsis, repeat troponin this evening.   CLL with chronic leukocytosis 8/29 WBC count is significantly elevated, could be due to steroids.  Steroids tapered off. Today wbc 66, advised outpatient oncology follow up. Thrombocytosis, continue to monitor Monitor CBC daily   Hypertension Blood pressure improved Continue lisinopril 20 mg p.o. daily Monitor BP and titrate medications accordingly Continue hydralazine prn   Type 2 diabetes, HbA1c 6.7, well-controlled Hold off metformin Accucheks, sliding scale.   Hypothyroid, continue Synthroid   Vitamin D Insufficiency: continue vitamin D 50,000 units p.o. weekly, follow with PCP to repeat vitamin D level after 3 to 6 months.   Vitamin B12 deficiency:  B12 1000 mcg oral supplement. Follow-up PCP to repeat vitamin B12 level.   Iron deficiency, continue oral iron supplement.Follow-up with PCP to repeat iron profile after 3 to 6 months Folic  acid level  8.3, continue oral supplement   Obesity Body mass index is 35.58 kg/m.  Diet, exercise and weight reduction advised.   Diet: Heart healthy diet DVT Prophylaxis: Subcutaneous Lovenox   PT/ OT recommended SNF placement as      Subjective: Patient is seen and examined today morning. He is lying in bed, states he is tired, worked with PT. Wife at bedside would like him to go to SNF. He is eating fair. Pain better controlled.  Physical Exam: Vitals:   03/18/23 2015 03/18/23 2347 03/19/23 0349 03/19/23 0752  BP: (!) 151/47 (!) 144/46 (!) 148/45 (!) 159/57  Pulse: 97 95 90 90  Resp: 20 20 20 18   Temp: 99.6 F (37.6 C) 98.7 F (37.1 C) 100 F (37.8 C) 98.7 F (37.1 C)  TempSrc:    Oral  SpO2: 95% 91% 94% 94%  Weight:      Height:       General -Elderly obese Caucasian male, no apparent distress HEENT - PERRLA, EOMI, atraumatic head, non tender sinuses. Lung -bibasilar Rales Heart - S1, S2 heard, no murmurs, rubs, 1+ pedal edema Neuro - Alert, awake and oriented x 3, non focal exam. Skin - Warm and dry. Bilateral arm slings noted. Data Reviewed:     Latest Ref Rng & Units 03/19/2023    4:16 AM 03/18/2023    8:01 AM 03/17/2023    3:14 AM  CBC  WBC 4.0 - 10.5 K/uL 66.7  56.6  58.3   Hemoglobin 13.0 - 17.0 g/dL 8.5  09.6  8.8   Hematocrit 39.0 - 52.0 % 26.4  30.8  26.5   Platelets 150 - 400 K/uL 461  367  410        Latest Ref Rng & Units 03/19/2023    4:16 AM 03/18/2023    8:01 AM 03/17/2023    3:14 AM  BMP  Glucose 70 - 99 mg/dL 045  409  811   BUN 8 - 23 mg/dL 11  16  16    Creatinine 0.61 - 1.24 mg/dL 9.14  7.82  9.56   Sodium 135 - 145 mmol/L 129  128  128   Potassium 3.5 - 5.1 mmol/L 4.0  4.3  3.7   Chloride 98 - 111 mmol/L 95  96  93   CO2 22 - 32 mmol/L 25  24  27    Calcium 8.9 - 10.3 mg/dL 7.7  7.9  8.1    DG Shoulder 1V Right  Result Date: 03/17/2023 CLINICAL DATA:  Status post reverse total right shoulder arthroplasty today. EXAM: RIGHT SHOULDER - 1 VIEW  COMPARISON:  CT right shoulder 03/13/2023 FINDINGS: Interval reverse total right shoulder arthroplasty. No perihardware lucency is seen to indicate hardware failure or loosening. Expected postoperative subcutaneous and subacromial/subdeltoid bursal air. Lateral surgical skin staples. No acute fracture or dislocation. IMPRESSION: Interval reverse total right shoulder arthroplasty without evidence of hardware failure or loosening. Electronically Signed   By: Neita Garnet M.D.   On: 03/17/2023 19:02   DG Shoulder 1V Left  Result Date: 03/17/2023 CLINICAL DATA:  Status post reverse total left arthroplasty performed last week. EXAM: LEFT SHOULDER COMPARISON:  Left shoulder radiographs 03/13/2023, CT left shoulder FINDINGS: Redemonstration of reverse total left shoulder arthroplasty. On frontal view there is normal alignment. No perihardware lucency to indicate hardware failure or loosening. Mild cortical step-off at the inferolateral left humeral head corresponding to residua of the acute fracture seen on prior 03/10/2023 CT. Mild acromioclavicular joint space narrowing  and peripheral osteophytosis. Mild superior left shoulder subcutaneous air is again seen. There are superolateral left shoulder surgical skin staples. IMPRESSION: Status post reverse total left shoulder arthroplasty without evidence of hardware failure or loosening. Electronically Signed   By: Neita Garnet M.D.   On: 03/17/2023 18:08     Family Communication: Wife at bedside understand and agree with current plan.  Disposition: Status is: Inpatient Remains inpatient appropriate because: safe discharge plan  Planned Discharge Destination: Home with Home Health and Skilled nursing facility    Time spent: 44 minutes  Author: Marcelino Duster, MD 03/19/2023 12:23 PM  For on call review [email or web address].

## 2023-03-19 NOTE — Progress Notes (Signed)
  Subjective: 2 Days Post-Op Procedure(s) (LRB): REVERSE SHOULDER ARTHROPLASTY (Right) Patient reports pain as mild.  Right shoulder doing well since surgery.  Left shoulder improving since surgery last week. Patient is well, and has had no acute complaints or problems Plan is to go Rehab after hospital stay. Negative for chest pain and shortness of breath Fever: no Gastrointestinal: Negative for nausea and vomiting  Objective: Vital signs in last 24 hours: Temp:  [97.9 F (36.6 C)-100 F (37.8 C)] 100 F (37.8 C) (09/05 0349) Pulse Rate:  [85-97] 90 (09/05 0349) Resp:  [18-20] 20 (09/05 0349) BP: (137-155)/(43-65) 148/45 (09/05 0349) SpO2:  [87 %-95 %] 94 % (09/05 0349)  Intake/Output from previous day:  Intake/Output Summary (Last 24 hours) at 03/19/2023 0711 Last data filed at 03/18/2023 2000 Gross per 24 hour  Intake 1020 ml  Output 1200 ml  Net -180 ml    Intake/Output this shift: No intake/output data recorded.  Labs: Recent Labs    03/17/23 0314 03/18/23 0801 03/19/23 0416  HGB 8.8* 10.4* 8.5*   Recent Labs    03/18/23 0801 03/19/23 0416  WBC 56.6* 66.7*  RBC 3.55* 2.91*  HCT 30.8* 26.4*  PLT 367 461*   Recent Labs    03/18/23 0801 03/19/23 0416  NA 128* 129*  K 4.3 4.0  CL 96* 95*  CO2 24 25  BUN 16 11  CREATININE 0.67 0.66  GLUCOSE 253* 242*  CALCIUM 7.9* 7.7*   No results for input(s): "LABPT", "INR" in the last 72 hours.   EXAM General - Patient is Alert and Oriented Extremity - Neurovascular intact Sensation intact distally Dorsiflexion/Plantar flexion intact Shoulder immobilizer in place. Dressing/Incision - clean, dry, with the Hemovac removed with no complication. Motor Function - intact, moving fingers and wrist well on exam.  Ambulated 180 feet with physical therapy.  Assistance with standing from the bed and transfers.  Past Medical History:  Diagnosis Date   Diabetes type 2, controlled (HCC)    Hypothyroid    UTI (urinary  tract infection)     Assessment/Plan: 2 Days Post-Op Procedure(s) (LRB): REVERSE SHOULDER ARTHROPLASTY (Right) Principal Problem:   Pneumonia Active Problems:   CAP (community acquired pneumonia)   Sepsis (HCC)   Hyponatremia   Bilateral humeral fractures   Acute hypoxic respiratory failure (HCC)   Hypophosphatemia   Hypomagnesemia  Estimated body mass index is 35.58 kg/m as calculated from the following:   Height as of this encounter: 5' 7.5" (1.715 m).   Weight as of this encounter: 104.6 kg. Advance diet Up with therapy  Discharge planning: Follow-up with [facility] orthopedics in 2 weeks for staple removal and x-rays of both the right and the left shoulder.  Bandage change as needed.  No showering.  Would benefit from a lift chair at home.  DVT Prophylaxis - Aspirin Shoulder immobilizer on both shoulders at all times.  Remove for bathing and physical therapy.  Dedra Skeens, PA-C Orthopaedic Surgery 03/19/2023, 7:11 AM

## 2023-03-19 NOTE — Progress Notes (Signed)
Occupational Therapy Treatment Patient Details Name: Philip Robbins MRN: [id number] DOB: 01/16/1941 Today's Date: 03/19/2023   History of present illness Pt is an 82 y/o M admitted on 03/08/23 after presenting with c/o altered mentation & hypoxia. Pt was previously diagnosed with UTI at Urgent Care. Pt was found to have sepsis, acute hypoxic respiratory failure 2/2 PNA & COPD/asthma exacerbation. Pt also found to have bilateral humeral fxs. Pt is s/p L reverse TSA & L biceps tenodesis on 03/13/23 & R reverse shoulder arthroplasty on 03/17/23. PMH: CLL, BPH, HTN, IIDM, hypothyroidism, HLD, chronic leukocytosis   OT comments  Chart reviewed, pt greeted in bed with wife and daughter, Diannia Ruder present. Family is present for family training targeting sling management, ADL safety, safe mobility with education provided re: HEP, sling/polar care management, ADL performance with appropriate DME. PT also in room for co tx. Wife and daughter able to demo sling management, anticipate polar care care management appropriate as well. Family shares they are concerned about patient falling at home. Discussed sleeping set up for increased ease of bed mobility, assist required for ADLs. Provided gait belt for increased safety with mobility. Lengthy discussion re: rehab vs home with family continuing to share their concerns regarding fall risk. Pt and family will benefit from further education and trial of ADL/mobility with pt current level of functioning. OT will follow acutely.       If plan is discharge home, recommend the following:  A lot of help with walking and/or transfers;A lot of help with bathing/dressing/bathroom;Assistance with cooking/housework;Direct supervision/assist for medications management;Assist for transportation;Direct supervision/assist for financial management;Help with stairs or ramp for entrance;Assistance with feeding   Equipment Recommendations  BSC/3in1;Wheelchair (measurements OT);Tub/shower  bench;Other (comment);Hospital bed (recliner/lift chair)    Recommendations for Other Services      Precautions / Restrictions Precautions Precautions: Fall;Shoulder Type of Shoulder Precautions: OZH:[id number] arm to remain in sling and abduction pillow with arm at neutral at all times except RoM exercises and hygiene. Can perform pendulums, elbow/wrist/hand RoM exercises. Can perform active elbow flexion with arm adducted (elbow by side) to allow for ADLs. Passive RoM allowed to 90 FF and 30 ER. ; GEX:[id number] arm to remain in sling and abduction pillow with arm at neutral at all times except RoM exercises and hygiene. Can perform pendulums, elbow/wrist/hand RoM exercises. Passive RoM allowed to 90 FF and 30 ER. Shoulder Interventions: Shoulder sling/immobilizer;At all times;Shoulder abduction pillow;Off for dressing/bathing/exercises Precaution Comments: pt now s/p L TSA on 8/30 Required Braces or Orthoses: Sling Restrictions Weight Bearing Restrictions: Yes RUE Weight Bearing: Non weight bearing LUE Weight Bearing: Non weight bearing       Mobility Bed Mobility Overal bed mobility: Needs Assistance Bed Mobility: Supine to Sit     Supine to sit: Mod assist, HOB elevated          Transfers Overall transfer level: Needs assistance Equipment used: None Transfers: Sit to/from Stand Sit to Stand: Contact guard assist, Min assist (from mildly increased bed height)                 Balance Overall balance assessment: Needs assistance Sitting-balance support: Feet supported Sitting balance-Leahy Scale: Good     Standing balance support: No upper extremity supported, During functional activity Standing balance-Leahy Scale: Fair                             ADL either performed or assessed with clinical judgement   ADL  Overall ADL's : Needs assistance/impaired                 Upper Body Dressing : Maximal assistance;Cueing for UE precautions;Adhering  to UE precautions Upper Body Dressing Details (indicate cue type and reason): Pt daughter/wife demoed sling management with supervision and increased time Lower Body Dressing: Maximal assistance                      Extremity/Trunk Assessment              Vision       Perception     Praxis      Cognition Arousal: Alert, Suspect due to medications Behavior During Therapy: WFL for tasks assessed/performed Overall Cognitive Status: Within Functional Limits for tasks assessed                               Problem Solving: Slow processing          Exercises Other Exercises Other Exercises: pt, wife, daugther provided education re: home set up, equpiment use, ADL performance, sling management, HEP for safe ADL completion    Shoulder Instructions       General Comments pt on 3L via [place] with spo2 >90%; on RA pt spo2 to 88% seated on edge of bed; HR MAX 117 with standing; BP elevated in sitting, nurse in room to provide meds    Pertinent Vitals/ Pain       Pain Assessment Pain Assessment: No/denies pain (but pt reports he feels loopy and fatigued)  Home Living                                          Prior Functioning/Environment              Frequency  Min 1X/week        Progress Toward Goals  OT Goals(current goals can now be found in the care plan section)  Progress towards OT goals: Progressing toward goals     Plan      Co-evaluation    PT/OT/SLP Co-Evaluation/Treatment: Yes Reason for Co-Treatment: To address functional/ADL transfers   OT goals addressed during session: ADL's and self-care      AM-PAC OT "6 Clicks" Daily Activity     Outcome Measure   Help from another person eating meals?: A Lot Help from another person taking care of personal grooming?: A Lot Help from another person toileting, which includes using toliet, bedpan, or urinal?: A Lot Help from another person bathing (including  washing, rinsing, drying)?: A Lot Help from another person to put on and taking off regular upper body clothing?: A Lot Help from another person to put on and taking off regular lower body clothing?: A Lot 6 Click Score: 12    End of Session Equipment Utilized During Treatment: Oxygen;Gait belt;Other (comment) (B slings)  OT Visit Diagnosis: Muscle weakness (generalized) (M62.81);Other abnormalities of gait and mobility (R26.89)   Activity Tolerance Patient tolerated treatment well   Patient Left in bed;with call bell/phone within reach;with bed alarm set;with family/visitor present;with nursing/sitter in room   Nurse Communication Mobility status;Precautions;Weight bearing status        Time: 1610-9604 OT Time Calculation (min): 53 min  Charges: OT General Charges $OT Visit: 1 Visit OT Treatments $Self Care/Home Management : 23-37 mins Tobi Bastos  Aerith Canal, OTD OTR/L  03/19/23, 12:53 PM

## 2023-03-19 NOTE — Consult Note (Addendum)
PHARMACY CONSULT NOTE - ELECTROLYTES  Pharmacy Consult for Electrolyte Monitoring and Replacement   Recent Labs: Potassium (mmol/L)  Date Value  03/19/2023 4.0   Magnesium (mg/dL)  Date Value  56/21/3086 1.6 (L)   Calcium (mg/dL)  Date Value  57/84/6962 7.7 (L)   Albumin (g/dL)  Date Value  95/28/4132 2.3 (L)   Phosphorus (mg/dL)  Date Value  44/07/270 2.5   Sodium (mmol/L)  Date Value  03/19/2023 129 (L)   Corrected Calcium: 9.3  Height: 5' 7.5" (171.5 cm) Weight: 104.6 kg (230 lb 9.6 oz) IBW/kg (Calculated) : 67.25 Estimated Creatinine Clearance: 85.6 mL/min (by C-G formula based on SCr of 0.66 mg/dL).  Assessment  Philip Robbins is a 82 y.o. male presenting with pneumonia and hyponatremia. PMH significant for CLL, BPH, HTN, DM, hypothyroidism, HLD. Pharmacy has been consulted to monitor and replace electrolytes.  Pertinent medications: lisinopril 20 mg daily Diet: regular, thin  Goal of Therapy: Electrolytes  WNL  Plan:  Mg 1.6, give Mg sulfate 2 g IV x1 Check Mg with AM labs Check BMP twice weekly (QMon/Thu) while stable  Thank you for allowing pharmacy to be a part of this patient's care.  Celene Squibb, PharmD Clinical Pharmacist 03/19/2023 8:02 AM

## 2023-03-19 NOTE — TOC Progression Note (Signed)
Transition of Care [facility]) - Progression Note    Patient Details  Name: Philip Robbins MRN: [id number] Date of Birth: [birth year]  Transition of Care [facility]) CM/SW Contact  Marlowe Sax, RN Phone Number: 03/19/2023, 3:40 PM  Clinical Narrative:     Spoke with Wife, they asked if I would check with [facility] rehab to see if he can  go there, I called Victorino Dike at [place] and faxed the referral, she will review to see if they can offer a bed  Expected Discharge Plan: Home w Home Health Services Barriers to Discharge: Continued Medical Work up  Expected Discharge Plan and Services   Discharge Planning Services: CM Consult Post Acute Care Choice: Home Health Living arrangements for the past 2 months: Single Family Home                   DME Agency: NA       HH Arranged: PT, OT HH Agency: [facility] Date [facility] Agency Contacted: 03/18/23 Time HH Agency Contacted: 1505 Representative spoke with at [facility] Agency: [place]   Social Determinants of Health (SDOH) Interventions SDOH Screenings   Food Insecurity: No Food Insecurity (03/10/2023)  Housing: Low Risk  (03/10/2023)  Transportation Needs: No Transportation Needs (03/10/2023)  Utilities: Not At Risk (03/10/2023)  Tobacco Use: Low Risk  (03/13/2023)    Readmission Risk Interventions     No data to display

## 2023-03-19 NOTE — Inpatient Diabetes Management (Signed)
Inpatient Diabetes Program Recommendations  AACE/ADA: New Consensus Statement on Inpatient Glycemic Control (2015)  Target Ranges:  Prepandial:   less than 140 mg/dL      Peak postprandial:   less than 180 mg/dL (1-2 hours)      Critically ill patients:  140 - 180 mg/dL    Latest Reference Range & Units 03/18/23 11:45 03/18/23 16:58 03/18/23 21:13  Glucose-Capillary 70 - 99 mg/dL 213 (H) 086 (H) 578 (H)  (H): Data is abnormally high  Latest Reference Range & Units 03/19/23 07:54 03/19/23 11:21  Glucose-Capillary 70 - 99 mg/dL 469 (H) 629 (H)  (H): Data is abnormally high   Home DM Meds: Metformin 1000 mg BID                             Actos 45 mg daily                             Januvia 50 mg daily   Current Orders: Novolog Moderate Correction Scale/ SSI (0-15 units) TID AC + HS                           Novolog 4 units TID with meals         MD- AM CBGs have been elevated the last 2 days.   Home oral DM meds are on hold   May consider starting low dose basal insulin: Semglee 10 units Daily (0.1 units/kg)    --Will follow patient during hospitalization--  Ambrose Finland RN, MSN, CDCES Diabetes Coordinator Inpatient Glycemic Control Team Team Pager: [phone] (8a-5p)

## 2023-03-19 NOTE — Plan of Care (Signed)
  Problem: Activity: Goal: Ability to tolerate increased activity will improve Outcome: Progressing   Problem: Clinical Measurements: Goal: Ability to maintain a body temperature in the normal range will improve Outcome: Progressing   Problem: Respiratory: Goal: Ability to maintain adequate ventilation will improve Outcome: Progressing Goal: Ability to maintain a clear airway will improve Outcome: Progressing   Problem: Education: Goal: Ability to describe self-care measures that may prevent or decrease complications (Diabetes Survival Skills Education) will improve Outcome: Progressing Goal: Individualized Educational Video(s) Outcome: Progressing   Problem: Coping: Goal: Ability to adjust to condition or change in health will improve Outcome: Progressing   Problem: Fluid Volume: Goal: Ability to maintain a balanced intake and output will improve Outcome: Progressing   Problem: Health Behavior/Discharge Planning: Goal: Ability to identify and utilize available resources and services will improve Outcome: Progressing Goal: Ability to manage health-related needs will improve Outcome: Progressing   Problem: Metabolic: Goal: Ability to maintain appropriate glucose levels will improve Outcome: Progressing   Problem: Nutritional: Goal: Maintenance of adequate nutrition will improve Outcome: Progressing Goal: Progress toward achieving an optimal weight will improve Outcome: Progressing   Problem: Skin Integrity: Goal: Risk for impaired skin integrity will decrease Outcome: Progressing   Problem: Tissue Perfusion: Goal: Adequacy of tissue perfusion will improve Outcome: Progressing   Problem: Education: Goal: Knowledge of General Education information will improve Description: Including pain rating scale, medication(s)/side effects and non-pharmacologic comfort measures Outcome: Progressing   Problem: Health Behavior/Discharge Planning: Goal: Ability to manage  health-related needs will improve Outcome: Progressing   Problem: Clinical Measurements: Goal: Ability to maintain clinical measurements within normal limits will improve Outcome: Progressing Goal: Will remain free from infection Outcome: Progressing Goal: Diagnostic test results will improve Outcome: Progressing Goal: Respiratory complications will improve Outcome: Progressing Goal: Cardiovascular complication will be avoided Outcome: Progressing   Problem: Activity: Goal: Risk for activity intolerance will decrease Outcome: Progressing   Problem: Nutrition: Goal: Adequate nutrition will be maintained Outcome: Progressing   Problem: Coping: Goal: Level of anxiety will decrease Outcome: Progressing   Problem: Elimination: Goal: Will not experience complications related to bowel motility Outcome: Progressing Goal: Will not experience complications related to urinary retention Outcome: Progressing   Problem: Pain Managment: Goal: General experience of comfort will improve Outcome: Progressing   Problem: Safety: Goal: Ability to remain free from injury will improve Outcome: Progressing   Problem: Skin Integrity: Goal: Risk for impaired skin integrity will decrease Outcome: Progressing   Problem: Education: Goal: Knowledge of the prescribed therapeutic regimen will improve Outcome: Progressing Goal: Understanding of activity limitations/precautions following surgery will improve Outcome: Progressing Goal: Individualized Educational Video(s) Outcome: Progressing   Problem: Activity: Goal: Ability to tolerate increased activity will improve Outcome: Progressing   Problem: Pain Management: Goal: Pain level will decrease with appropriate interventions Outcome: Progressing

## 2023-03-19 NOTE — Plan of Care (Signed)
  Problem: Activity: Goal: Ability to tolerate increased activity will improve Outcome: Progressing   Problem: Respiratory: Goal: Ability to maintain adequate ventilation will improve Outcome: Progressing Goal: Ability to maintain a clear airway will improve Outcome: Progressing   Problem: Skin Integrity: Goal: Risk for impaired skin integrity will decrease Outcome: Progressing   Problem: Tissue Perfusion: Goal: Adequacy of tissue perfusion will improve Outcome: Progressing   Problem: Clinical Measurements: Goal: Respiratory complications will improve Outcome: Progressing   Problem: Pain Managment: Goal: General experience of comfort will improve Outcome: Progressing   Problem: Safety: Goal: Ability to remain free from injury will improve Outcome: Progressing

## 2023-03-20 ENCOUNTER — Telehealth: Payer: Self-pay | Admitting: Student

## 2023-03-20 ENCOUNTER — Inpatient Hospital Stay (HOSPITAL_COMMUNITY)
Admit: 2023-03-20 | Discharge: 2023-03-20 | Disposition: A | Discharge status: Home / Self Care | Payer: Medicare Other | Attending: Internal Medicine | Admitting: Internal Medicine

## 2023-03-20 DIAGNOSIS — G9341 Metabolic encephalopathy: Secondary | ICD-10-CM | POA: Insufficient documentation

## 2023-03-20 DIAGNOSIS — R0609 Other forms of dyspnea: Secondary | ICD-10-CM

## 2023-03-20 DIAGNOSIS — S42301A Unspecified fracture of shaft of humerus, right arm, initial encounter for closed fracture: Secondary | ICD-10-CM

## 2023-03-20 DIAGNOSIS — J9601 Acute respiratory failure with hypoxia: Secondary | ICD-10-CM | POA: Diagnosis not present

## 2023-03-20 DIAGNOSIS — A419 Sepsis, unspecified organism: Secondary | ICD-10-CM | POA: Diagnosis not present

## 2023-03-20 LAB — ECHOCARDIOGRAM COMPLETE
AR max vel: 2.41 cm2
AV Area VTI: 2.88 cm2
AV Area mean vel: 2.44 cm2
AV Mean grad: 7.5 mmHg
AV Peak grad: 14.1 mmHg
Ao pk vel: 1.88 m/s
Area-P 1/2: 2.56 cm2
Height: 67.5 in
MV VTI: 3.8 cm2
S' Lateral: 2.6 cm
Weight: 3689.62 [oz_av]

## 2023-03-20 LAB — CBC
HCT: 26.4 % — ABNORMAL LOW (ref 39.0–52.0)
HCT: 24.7 % — ABNORMAL LOW (ref 39.0–52.0)
Hemoglobin: 8.5 g/dL — ABNORMAL LOW (ref 13.0–17.0)
Hemoglobin: 8.1 g/dL — ABNORMAL LOW (ref 13.0–17.0)
MCH: 29.2 pg (ref 26.0–34.0)
MCH: 29.6 pg (ref 26.0–34.0)
MCHC: 32.2 g/dL (ref 30.0–36.0)
MCHC: 32.8 g/dL (ref 30.0–36.0)
MCV: 90.7 fL (ref 80.0–100.0)
MCV: 90.1 fL (ref 80.0–100.0)
Platelets: 461 10*3/uL — ABNORMAL HIGH (ref 150–400)
Platelets: 480 10*3/uL — ABNORMAL HIGH (ref 150–400)
RBC: 2.91 MIL/uL — ABNORMAL LOW (ref 4.22–5.81)
RBC: 2.74 MIL/uL — ABNORMAL LOW (ref 4.22–5.81)
RDW: 15.4 % (ref 11.5–15.5)
RDW: 15.6 % — ABNORMAL HIGH (ref 11.5–15.5)
WBC: 66.7 10*3/uL (ref 4.0–10.5)
WBC: 69.6 10*3/uL (ref 4.0–10.5)
nRBC: 0 % (ref 0.0–0.2)
nRBC: 0 % (ref 0.0–0.2)

## 2023-03-20 LAB — GLUCOSE, CAPILLARY
Glucose-Capillary: 218 mg/dL — ABNORMAL HIGH (ref 70–99)
Glucose-Capillary: 202 mg/dL — ABNORMAL HIGH (ref 70–99)
Glucose-Capillary: 185 mg/dL — ABNORMAL HIGH (ref 70–99)

## 2023-03-20 LAB — BASIC METABOLIC PANEL
Anion gap: 9 (ref 5–15)
BUN: 12 mg/dL (ref 8–23)
CO2: 23 mmol/L (ref 22–32)
Calcium: 7.7 mg/dL — ABNORMAL LOW (ref 8.9–10.3)
Chloride: 97 mmol/L — ABNORMAL LOW (ref 98–111)
Creatinine, Ser: 0.63 mg/dL (ref 0.61–1.24)
GFR, Estimated: >60 mL/min (ref >60)
Glucose, Bld: 199 mg/dL — ABNORMAL HIGH (ref 70–99)
Potassium: 4 mmol/L (ref 3.5–5.1)
Sodium: 129 mmol/L — ABNORMAL LOW (ref 135–145)

## 2023-03-20 LAB — MAGNESIUM: Magnesium: 1.9 mg/dL (ref 1.7–2.4)

## 2023-03-20 MED ORDER — VITAMIN D (ERGOCALCIFEROL) 1.25 MG (50000 UNIT) PO CAPS: 50000.0000 [IU] | ORAL_CAPSULE | ORAL | 0 refills | Status: DC

## 2023-03-20 MED ORDER — DOCUSATE SODIUM 100 MG PO CAPS: 100.0000 mg | ORAL_CAPSULE | Freq: Two times a day (BID) | ORAL | 0 refills | Status: DC

## 2023-03-20 MED ORDER — BISACODYL 10 MG RE SUPP: 10.0000 mg | Freq: Every day | RECTAL | 0 refills | Status: DC | PRN

## 2023-03-20 MED ORDER — POLYSACCHARIDE IRON COMPLEX 150 MG PO CAPS: 150.0000 mg | ORAL_CAPSULE | Freq: Every day | ORAL | 1 refills | Status: DC

## 2023-03-20 MED ORDER — GUAIFENESIN ER 600 MG PO TB12: 600.0000 mg | ORAL_TABLET | Freq: Two times a day (BID) | ORAL | 0 refills | Status: AC | PRN

## 2023-03-20 MED ORDER — BISACODYL 10 MG RE SUPP: 10.0000 mg | Freq: Once | RECTAL | Status: AC

## 2023-03-20 MED ORDER — CYANOCOBALAMIN 1000 MCG PO TABS: 1000.0000 ug | ORAL_TABLET | Freq: Every day | ORAL | 3 refills | Status: DC

## 2023-03-20 MED ORDER — OXYMETAZOLINE HCL 0.05 % NA SOLN: 1.0000 | Freq: Two times a day (BID) | NASAL | 0 refills | Status: DC

## 2023-03-20 MED ORDER — ASCORBIC ACID 500 MG PO TABS: 500.0000 mg | ORAL_TABLET | Freq: Every day | ORAL | 1 refills | Status: DC

## 2023-03-20 MED ORDER — OXYMETAZOLINE HCL 0.05 % NA SOLN
1.0000 | Freq: Two times a day (BID) | NASAL | Status: DC
  Administered 2023-03-20: 1 via NASAL
  Filled 2023-03-20: qty 15

## 2023-03-20 MED ORDER — FOLIC ACID 1 MG PO TABS: 1.0000 mg | ORAL_TABLET | Freq: Every day | ORAL | 1 refills | Status: DC

## 2023-03-20 MED ORDER — OXYCODONE HCL 5 MG PO TABS: 5.0000 mg | ORAL_TABLET | Freq: Four times a day (QID) | ORAL | 0 refills | Status: DC | PRN

## 2023-03-20 MED ORDER — OXYCODONE HCL 5 MG PO TABS
5.0000 mg | ORAL_TABLET | Freq: Four times a day (QID) | ORAL | 0 refills | Status: DC | PRN
Start: 2023-03-20 — End: 2023-03-20

## 2023-03-20 NOTE — Plan of Care (Signed)
  Problem: Activity: Goal: Ability to tolerate increased activity will improve Outcome: Progressing   Problem: Clinical Measurements: Goal: Ability to maintain a body temperature in the normal range will improve Outcome: Progressing   Problem: Respiratory: Goal: Ability to maintain adequate ventilation will improve Outcome: Progressing   Problem: Education: Goal: Ability to describe self-care measures that may prevent or decrease complications (Diabetes Survival Skills Education) will improve Outcome: Progressing   Problem: Fluid Volume: Goal: Ability to maintain a balanced intake and output will improve Outcome: Progressing   Problem: Health Behavior/Discharge Planning: Goal: Ability to identify and utilize available resources and services will improve Outcome: Progressing   Problem: Metabolic: Goal: Ability to maintain appropriate glucose levels will improve Outcome: Progressing   Problem: Nutritional: Goal: Maintenance of adequate nutrition will improve Outcome: Progressing

## 2023-03-20 NOTE — TOC Progression Note (Signed)
Transition of Care [facility]) - Progression Note    Patient Details  Name: SERAPHIN CHILDREY MRN: [id number] Date of Birth: 23-Jul-1940  Transition of Care [facility]) CM/SW Contact  Marlowe Sax, RN Phone Number: 03/20/2023, 9:49 AM  Clinical Narrative:     Shona Simpson has accepted the patient for STR,  I spoke with the patient's wife and they are thrilled and accepted the bed  Expected Discharge Plan: Home w Home Health Services Barriers to Discharge: Continued Medical Work up  Expected Discharge Plan and Services   Discharge Planning Services: CM Consult Post Acute Care Choice: Home Health Living arrangements for the past 2 months: Single Family Home                   DME Agency: NA       HH Arranged: PT, OT HH Agency: [facility] Date HH Agency Contacted: 03/18/23 Time HH Agency Contacted: 1505 Representative spoke with at [facility] Agency: [place]   Social Determinants of Health (SDOH) Interventions SDOH Screenings   Food Insecurity: No Food Insecurity (03/10/2023)  Housing: Low Risk  (03/10/2023)  Transportation Needs: No Transportation Needs (03/10/2023)  Utilities: Not At Risk (03/10/2023)  Tobacco Use: Low Risk  (03/13/2023)    Readmission Risk Interventions     No data to display

## 2023-03-20 NOTE — TOC Progression Note (Signed)
Transition of Care [facility]) - Progression Note    Patient Details  Name: Philip Robbins MRN: [id number] Date of Birth: [birth year]  Transition of Care [facility]) CM/SW Contact  Marlowe Sax, RN Phone Number: 03/20/2023, 11:40 AM  Clinical Narrative:     Patient is going to [facility] room 108,  called EMS to arrange Transport he is 3rd on list  Expected Discharge Plan: Home w Home Health Services Barriers to Discharge: Continued Medical Work up  Expected Discharge Plan and Services   Discharge Planning Services: CM Consult Post Acute Care Choice: Home Health Living arrangements for the past 2 months: Single Family Home Expected Discharge Date: 03/20/23                 DME Agency: NA       HH Arranged: PT, OT HH Agency: [facility] Date HH Agency Contacted: 03/18/23 Time HH Agency Contacted: 1505 Representative spoke with at [facility] Agency: [place]   Social Determinants of Health (SDOH) Interventions SDOH Screenings   Food Insecurity: No Food Insecurity (03/10/2023)  Housing: Low Risk  (03/10/2023)  Transportation Needs: No Transportation Needs (03/10/2023)  Utilities: Not At Risk (03/10/2023)  Tobacco Use: Low Risk  (03/13/2023)    Readmission Risk Interventions     No data to display

## 2023-03-20 NOTE — TOC Progression Note (Signed)
Transition of Care [facility]) - Progression Note    Patient Details  Name: Philip Robbins MRN: [id number] Date of Birth: 1940-07-29  Transition of Care [facility]) CM/SW Contact  Marlowe Sax, RN Phone Number: 03/20/2023, 9:19 AM  Clinical Narrative:    Called and left a secure VM for [facility] asking if they have a rehab bed, Notified Sue Lush at [facility] to ask if they have a bed Awaiting to hear from both    Expected Discharge Plan: Home w Home Health Services Barriers to Discharge: Continued Medical Work up  Expected Discharge Plan and Services   Discharge Planning Services: CM Consult Post Acute Care Choice: Home Health Living arrangements for the past 2 months: Single Family Home                   DME Agency: NA       HH Arranged: PT, OT HH Agency: [facility] Date [facility] Agency Contacted: 03/18/23 Time HH Agency Contacted: 1505 Representative spoke with at [facility] Agency: [place]   Social Determinants of Health (SDOH) Interventions SDOH Screenings   Food Insecurity: No Food Insecurity (03/10/2023)  Housing: Low Risk  (03/10/2023)  Transportation Needs: No Transportation Needs (03/10/2023)  Utilities: Not At Risk (03/10/2023)  Tobacco Use: Low Risk  (03/13/2023)    Readmission Risk Interventions     No data to display

## 2023-03-20 NOTE — Progress Notes (Signed)
  Subjective: 3 Days Post-Op Procedure(s) (LRB): REVERSE SHOULDER ARTHROPLASTY (Right) Patient reports pain as mild.  Right shoulder doing well since surgery.  Left shoulder improving since surgery last week. Patient is well, and has had no acute complaints or problems Plan is to go Rehab after hospital stay. Negative for chest pain and shortness of breath Fever: no Gastrointestinal: Negative for nausea and vomiting  Objective: Vital signs in last 24 hours: Temp:  [98.6 F (37 C)-98.7 F (37.1 C)] 98.7 F (37.1 C) (09/05 2239) Pulse Rate:  [85-90] 85 (09/05 2239) Resp:  [18] 18 (09/05 2239) BP: (137-160)/(45-57) 160/50 (09/05 2239) SpO2:  [94 %-96 %] 95 % (09/05 2239)  Intake/Output from previous day:  Intake/Output Summary (Last 24 hours) at 03/20/2023 0648 Last data filed at 03/20/2023 0539 Gross per 24 hour  Intake 590 ml  Output 1500 ml  Net -910 ml    Intake/Output this shift: Total I/O In: -  Out: 700 [Urine:700]  Labs: Recent Labs    03/18/23 0801 03/19/23 0416 03/20/23 0439  HGB 10.4* 8.5* 8.1*   Recent Labs    03/19/23 0416 03/20/23 0439  WBC 66.7* 69.6*  RBC 2.91* 2.74*  HCT 26.4* 24.7*  PLT 461* 480*   Recent Labs    03/19/23 0416 03/20/23 0439  NA 129* 129*  K 4.0 4.0  CL 95* 97*  CO2 25 23  BUN 11 12  CREATININE 0.66 0.63  GLUCOSE 242* 199*  CALCIUM 7.7* 7.7*   No results for input(s): "LABPT", "INR" in the last 72 hours.   EXAM General - Patient is Alert and Oriented Extremity - Neurovascular intact Sensation intact distally Dorsiflexion/Plantar flexion intact Shoulder immobilizer in place. Dressing/Incision - clean, dry, with the Hemovac removed with no complication. Motor Function - intact, moving fingers and wrist well on exam.  Ambulated 180 feet with physical therapy.  Assistance with standing from the bed and transfers.  Past Medical History:  Diagnosis Date   Diabetes type 2, controlled (HCC)    Hypothyroid    UTI  (urinary tract infection)     Assessment/Plan: 3 Days Post-Op Procedure(s) (LRB): REVERSE SHOULDER ARTHROPLASTY (Right) Principal Problem:   Pneumonia Active Problems:   CAP (community acquired pneumonia)   Sepsis (HCC)   Hyponatremia   Bilateral humeral fractures   Acute hypoxic respiratory failure (HCC)   Hypophosphatemia   Hypomagnesemia  Estimated body mass index is 35.58 kg/m as calculated from the following:   Height as of this encounter: 5' 7.5" (1.715 m).   Weight as of this encounter: 104.6 kg. Advance diet Up with therapy  Discharge planning: Discharge planning to go to rehab for short-term, before going home.  Follow-up with [facility] clinic orthopedics in 2 weeks for staple removal and x-rays of both the right and the left shoulder.  Bandage change as needed.  No showering.  Would benefit from a lift chair at home.  DVT Prophylaxis - Aspirin Shoulder immobilizer on both shoulders at all times.  Remove for bathing and physical therapy.  Dedra Skeens, PA-C Orthopaedic Surgery 03/20/2023, 6:48 AM

## 2023-03-20 NOTE — Progress Notes (Signed)
Physical Therapy Treatment Patient Details Name: Philip Robbins MRN: [id number] DOB: [birth year] Today's Date: 03/20/2023   History of Present Illness Pt is an 82 y/o M admitted on 03/08/23 after presenting with c/o altered mentation & hypoxia. Pt was previously diagnosed with UTI at Urgent Care. Pt was found to have sepsis, acute hypoxic respiratory failure 2/2 PNA & COPD/asthma exacerbation. Pt also found to have bilateral humeral fxs. Pt is s/p L reverse TSA & L biceps tenodesis on 03/13/23 & R reverse shoulder arthroplasty on 03/17/23. PMH: CLL, BPH, HTN, IIDM, hypothyroidism, HLD, chronic leukocytosis    PT Comments  PT/OT co-treat with family education throughout. Pt was able to exit bed with min assist. Stood and tolerated ambulation ~ 160 ft on rm air. Did require several standing rest due to fatigue. Sao2 dropped one time to 87% but recovered without need of O2. RN made aware pt is left off O2 at conclusion of session. Overall pt tolerates session well and is progressing towards all PT goals. Family still states they do not feel they can safely manage pt at home in current state.    If plan is discharge home, recommend the following: A little help with walking and/or transfers;A lot of help with bathing/dressing/bathroom;Assistance with feeding;Assistance with cooking/housework;Direct supervision/assist for medications management;Direct supervision/assist for financial management;Assist for transportation;Help with stairs or ramp for entrance     Equipment Recommendations  Other (comment) (Defer to next level of care endless pt DC's home from acute hospital.)       Precautions / Restrictions Precautions Precautions: Fall;Shoulder Type of Shoulder Precautions: FIE:PPIRJJOAC arm to remain in sling and abduction pillow with arm at neutral at all times except RoM exercises and hygiene. Can perform pendulums, elbow/wrist/hand RoM exercises. Can perform active elbow flexion with arm adducted (elbow  by side) to allow for ADLs. Passive RoM allowed to 90 FF and 30 ER. ; ZYS:AYTKZSWFU arm to remain in sling and abduction pillow with arm at neutral at all times except RoM exercises and hygiene. Can perform pendulums, elbow/wrist/hand RoM exercises. Passive RoM allowed to 90 FF and 30 ER. Shoulder Interventions: Shoulder sling/immobilizer;At all times;Shoulder abduction pillow;Off for dressing/bathing/exercises Precaution Booklet Issued: Yes (comment) Precaution Comments: S/P BUE TSA Required Braces or Orthoses: Sling Restrictions Weight Bearing Restrictions: Yes RUE Weight Bearing: Non weight bearing LUE Weight Bearing: Non weight bearing     Mobility  Bed Mobility Overal bed mobility: Needs Assistance Bed Mobility: Supine to Sit  Supine to sit: Min assist, HOB elevated, +2 for safety/equipment  General bed mobility comments: Min assist required to safely exit L side of bed. INcreased time with step by step VCs    Transfers Overall transfer level: Needs assistance Equipment used: None Transfers: Sit to/from Stand Sit to Stand: Contact guard assist, Supervision  General transfer comment: Pt was able to stand without physical assistance. Vcs for fwd wt shift. gait belt in place but not used during transfer    Ambulation/Gait Ambulation/Gait assistance: Contact guard assist Gait Distance (Feet): 160 Feet Assistive device: None Gait Pattern/deviations: Staggering left, Staggering right, Narrow base of support Gait velocity: decreased  General Gait Details: Pt was able to ambulate ~ 160 ft without AD. three standing rest. sao2 dropped to 87% on rm air but with breathing tehniques was able to recover. O2 left off pt at conclusion of session with RN staff made aware.   Balance Overall balance assessment: Needs assistance Sitting-balance support: Feet supported Sitting balance-Leahy Scale: Good     Standing balance  support: No upper extremity supported, During functional  activity Standing balance-Leahy Scale: Fair       Cognition Arousal: Alert Behavior During Therapy: WFL for tasks assessed/performed Overall Cognitive Status: Within Functional Limits for tasks assessed  General Comments: pt was more cognitively sharp today evrsus previous date. He agrees to session and remains cooperative           General Comments General comments (skin integrity, edema, etc.): pt's daughter was educated on post acute care needs      Pertinent Vitals/Pain Pain Assessment Pain Intervention(s): Limited activity within patient's tolerance, Monitored during session, Premedicated before session, Repositioned     PT Goals (current goals can now be found in the care plan section) Acute Rehab PT Goals Patient Stated Goal: get better then go home Progress towards PT goals: Progressing toward goals    Frequency    Min 1X/week       AM-PAC PT "6 Clicks" Mobility   Outcome Measure  Help needed turning from your back to your side while in a flat bed without using bedrails?: A Lot Help needed moving from lying on your back to sitting on the side of a flat bed without using bedrails?: A Lot Help needed moving to and from a bed to a chair (including a wheelchair)?: A Little Help needed standing up from a chair using your arms (e.g., wheelchair or bedside chair)?: A Little Help needed to walk in hospital room?: A Little Help needed climbing 3-5 steps with a railing? : A Little 6 Click Score: 16    End of Session Equipment Utilized During Treatment: Gait belt Activity Tolerance: Patient tolerated treatment well Patient left: in chair;with call bell/phone within reach;with family/visitor present Nurse Communication: Mobility status PT Visit Diagnosis: Unsteadiness on feet (R26.81);Other abnormalities of gait and mobility (R26.89);Muscle weakness (generalized) (M62.81);Pain Pain - Right/Left: Right Pain - part of body: Shoulder     Time: 9562-1308 PT Time  Calculation (min) (ACUTE ONLY): 27 min  Charges:    $Gait Training: 8-22 mins PT General Charges $$ ACUTE PT VISIT: 1 Visit                     Jetta Lout PTA 03/20/23, 9:43 AM

## 2023-03-20 NOTE — Telephone Encounter (Signed)
Patient to admit to skilled nursing for rehab after seizure and bilateral shoulder fx. Pain medication sent to internal pharmacy.

## 2023-03-20 NOTE — Plan of Care (Signed)
  Problem: Activity: Goal: Ability to tolerate increased activity will improve Outcome: Progressing   Problem: Clinical Measurements: Goal: Ability to maintain a body temperature in the normal range will improve Outcome: Progressing   Problem: Respiratory: Goal: Ability to maintain adequate ventilation will improve Outcome: Progressing Goal: Ability to maintain a clear airway will improve Outcome: Progressing   Problem: Coping: Goal: Ability to adjust to condition or change in health will improve Outcome: Progressing   Problem: Nutritional: Goal: Maintenance of adequate nutrition will improve Outcome: Progressing   Problem: Skin Integrity: Goal: Risk for impaired skin integrity will decrease Outcome: Progressing   Problem: Tissue Perfusion: Goal: Adequacy of tissue perfusion will improve Outcome: Progressing   Problem: Education: Goal: Knowledge of General Education information will improve Description: Including pain rating scale, medication(s)/side effects and non-pharmacologic comfort measures Outcome: Progressing   Problem: Health Behavior/Discharge Planning: Goal: Ability to manage health-related needs will improve Outcome: Progressing   Problem: Clinical Measurements: Goal: Will remain free from infection Outcome: Progressing Goal: Cardiovascular complication will be avoided Outcome: Progressing   Problem: Activity: Goal: Risk for activity intolerance will decrease Outcome: Progressing   Problem: Nutrition: Goal: Adequate nutrition will be maintained Outcome: Progressing   Problem: Coping: Goal: Level of anxiety will decrease Outcome: Progressing   Problem: Pain Managment: Goal: General experience of comfort will improve Outcome: Progressing   Problem: Safety: Goal: Ability to remain free from injury will improve Outcome: Progressing

## 2023-03-20 NOTE — TOC Progression Note (Signed)
Transition of Care [facility]) - Progression Note    Patient Details  Name: Philip Robbins MRN: [id number] Date of Birth: 1941-03-31  Transition [facility] Care [facility]) CM/SW Contact  Marlowe Sax, RN Phone Number: 03/20/2023, 3:20 PM  Clinical Narrative:     Ems arranged again since the patient has had a BM, he is number 9 on the list to go to [facility] room 108  Expected Discharge Plan: Home w Home Health Services Barriers to Discharge: Continued Medical Work up  Expected Discharge Plan and Services   Discharge Planning Services: CM Consult Post Acute Care Choice: Home Health Living arrangements for the past 2 months: Single Family Home Expected Discharge Date: 03/20/23                 DME Agency: NA       HH Arranged: PT, OT HH Agency: [facility] Date HH Agency Contacted: 03/18/23 Time HH Agency Contacted: 1505 Representative spoke with at [facility] Agency: [place]   Social Determinants of Health (SDOH) Interventions SDOH Screenings   Food Insecurity: No Food Insecurity (03/10/2023)  Housing: Low Risk  (03/10/2023)  Transportation Needs: No Transportation Needs (03/10/2023)  Utilities: Not At Risk (03/10/2023)  Tobacco Use: Low Risk  (03/13/2023)    Readmission Risk Interventions     No data to display

## 2023-03-20 NOTE — Discharge Summary (Addendum)
Physician Discharge Summary   Patient: Philip Robbins MRN: [id number] DOB: January 20, 1941  Admit date:     03/08/2023  Discharge date: 03/20/23  Discharge Physician: Marcelino Duster   PCP: Pcp, No   Recommendations at discharge:    PCP follow up in 1 week. Follow up [facility] clinic orthopedics in 2 weeks for staple removal and x-rays of both the right and the left shoulder.  Outpatient follow-up with urology for urodynamic study. Echo need to be followed, was given trial of Lasix while inpatient.  Discharge Diagnoses: Principal Problem:   Pneumonia Active Problems:   CAP (community acquired pneumonia)   Sepsis (HCC)   Hyponatremia   Bilateral humeral fractures   Acute hypoxic respiratory failure (HCC)   Hypophosphatemia   Hypomagnesemia  Resolved Problems:   * No resolved hospital problems. [facility] Course: KEVINS JAFRI is a 82 y.o. male with medical history significant of CLL under surveillance, BPH, HTN, IIDM, hypothyroidism, HLD, chronic leukocytosis presented with altered mentations hypoxia.   His symptoms started last Friday, patient went to have a left neck lymph node biopsy, after the procedure patient developed URI-like symptoms with runny nose sore throat, yesterday, patient developed strong burning sensation in the urine and dark-colored urine and went to see urgent care was diagnosed with UTI and started on antibiotics.  Patient continued to feel malaise generalized weakness, nauseous but no vomiting and no abdominal pain overnight and this morning patient was found in the bed unresponsive with large vomitus on his bed and foams in his mouth.  Family called EMS, EMS arrived and found patient hypoxic O2 saturation 95% on room air and patient was placed on 15 L NRB and sent to ED.  Fever 101.8.  Wife also witnessed patient had some twitching movement of his arms and suspect whether he had seizure.  At baseline patient has BPH appears to be poorly controlled, he  reported Brueggen screams and frequent nocturnal urination but no history of UTI before.   ED Course: Fever 101.2, tachycardia hypotensive oxygen saturation 91% on 6 L.  Chest x-ray showed bilateral infiltrates indicating for pneumonia.  WBC 35.1.  Hemoglobin 12, sodium 118, K4.1, bicarb 12, creatinine 1.1, troponin 64> 99, AST 67, ALT 46. Patient was given 3 L IV bolus and started on ceftriaxone, azithromycin.  During hospital stay, patient was treated for sepsis possibly secondary to UTI, aspiration pneumonia.  Patient received 5 days of Rocephin and azithromycin therapy.  Patient's sodium gradually corrected, his sodium remains around 130.  Phos and mag repleted.  Patient's troponin elevated likely due to underlying sepsis.  Echocardiogram ordered which is pending.  Patient is seen by orthopedics for bilateral humeral fractures and dislocations status post bilateral total shoulder arthroplasty.  Patient was hypoxic was initially given steroids, DuoNebs, trial of Lasix.  Patient did improve and today is off supplemental oxygen.  Patient does have CLL with chronic leukocytosis and his white count is around 69, advised to follow-up with oncology as outpatient.  His blood sugars closely monitored, insulin adjusted accordingly.  Patient's vitamin B12 level low repleted and advised to continue taking vitamin B12, vitamin D, iron and folate supplements.  Advised him to follow-up with primary care physician, orthopedic surgery and oncology upon discharge as instructed.  Patient and his daughter understand and agree with the discharge plan.   Assessment and Plan: Bilateral Humeral fracture From possible trauma while transportation as pt was combative and he was held strongly by EMS.  CT left shoulder shows  dislocation, fracture and hemarthrosis 8/30 s/p  Left reverse total shoulder arthroplasty,  Left biceps tenodesis and Right closed management of proximal humerus fracture  8/30 CT right shoulder consistent  with dislocation and fracture 9/3 s/p right reverse total shoulder arthroplasty He worked with PT, recommended SNF.  Follow-up with [facility] clinic orthopedics in 2 weeks for staple removal and x-rays of both the right and the left shoulder. Bandage change as needed. No showering.    Sepsis, severe, Resolved Evidenced by hypoxia, elevated temperature and elevated lactic acid with signs of endorgan damage of acute encephalopathy and source of infection likely aspiration pneumonia and UTI.  S/p ceftriaxone and azithromycin to cover pneumonia and UTI for 5 days.  Finished antibiotic course.   Acute hypoxic respiratory failure secondary to pneumonia and COPD/asthma exacerbation  Patient does not have diagnosis of COPD or asthma Patient was wheezing, resolved on 8/28 Got prednisone 40 mg p.o. daily for 3 days Continue Breo Ellipta inhaler and DuoNeb TID Continue supplemental O2 inhalation as needed Also got a trial of Lasix 40 mg IV twice daily for 4 days. Chest xray repeat shows bilateral atelectasis, encourage incentive spirometry. Echo done, reading pending at this time.   Aspiration pneumonia Continue aspiration precautions. Finished antibiotic therapy.   UTI Finished antibiotics Outpatient follow-up with urology for urodynamic study Continue Flomax   Acute hypotonic hyponatremia Hypovolemic, likely secondary to acute GI loss from repeated vomiting overnight and sepsis Serum osmolality 257 low, TSH 2.6 within normal range Nephro consulted, s/p saline 3% IVF discontinued on 8/27 Na stable at 129. Continue fluid restriction 1.5 L/day   Hypophosphatemia, Phos repleted. Resolved Hypomagnesemia, mag sulfate ordered per pharmacy. Monitor electrolytes daily and replete as needed   Question of seizure Acute metabolic encephalopathy No history of seizure, given the severity of sepsis and electrolyte imbalance, more suspect pseudoseizure, encephalopathy. Seizure precautions.    Troponin elevation No chest pains, EKG showed no acute ST changes and the poor R wave progression appears to be chronic. Etiology likely related to sepsis and acute hypoxia and demanding ischemia.  Treat sepsis, repeat troponin this evening.   CLL with chronic leukocytosis 8/29 WBC count is significantly elevated, could be due to steroids.  Steroids tapered off. Today wbc 66, advised outpatient oncology follow up. Thrombocytosis, continue to monitor Monitor CBC daily   Hypertension Blood pressure improved Continue lisinopril 20 mg p.o. daily Monitor BP and titrate medications accordingly Continue hydralazine prn   Type 2 diabetes, HbA1c 6.7, well-controlled with home regimen. Resumed home regimen   Hypothyroid, continue Synthroid   Vitamin D Insufficiency: continue vitamin D 50,000 units p.o. weekly, follow with PCP to repeat vitamin D level after 3 to 6 months. Vitamin B12 deficiency:  B12 1000 mcg oral supplement. Follow-up PCP to repeat vitamin B12 level. Iron deficiency, continue oral iron supplement.Follow-up with PCP to repeat iron profile after 3 to 6 months continue oral folic acid supplement   Obesity Body mass index is 35.58 kg/m.  Diet, exercise and weight reduction advised.        Consultants: Orthopedic surgery, nephrology Procedures performed: 8/30 Left reverse total shoulder arthroplasty,  Left biceps tenodesis and Right closed management of proximal humerus fracture  9/3 s/p right reverse total shoulder arthroplasty  Disposition: Skilled nursing facility Diet recommendation:  Discharge Diet Orders (From admission, onward)     Start     Ordered   03/20/23 0000  Diet - low sodium heart healthy        03/20/23 1057  03/20/23 0000  Diet Carb Modified        03/20/23 1057           Cardiac and Carb modified diet DISCHARGE MEDICATION: Allergies as of 03/20/2023   No Known Allergies      Medication List     STOP taking these medications     levofloxacin 750 MG tablet Commonly known as: LEVAQUIN       TAKE these medications    ascorbic acid 500 MG tablet Commonly known as: VITAMIN C Take 1 tablet (500 mg total) by mouth daily.   atorvastatin 40 MG tablet Commonly known as: LIPITOR Take 40 mg by mouth daily.   bisacodyl 10 MG suppository Commonly known as: DULCOLAX Place 1 suppository (10 mg total) rectally daily as needed for moderate constipation.   cyanocobalamin 1000 MCG tablet Take 1 tablet (1,000 mcg total) by mouth daily.   docusate sodium 100 MG capsule Commonly known as: COLACE Take 1 capsule (100 mg total) by mouth 2 (two) times daily.   fluticasone 50 MCG/ACT nasal [place] Commonly known as: FLONASE Place 2 sprays into both nostrils every evening.   folic acid 1 MG tablet Commonly known as: FOLVITE Take 1 tablet (1 mg total) by mouth daily.   guaiFENesin 600 MG 12 hr tablet Commonly known as: MUCINEX Take 1 tablet (600 mg total) by mouth 2 (two) times daily as needed for up to 5 days for to loosen phlegm or cough.   iron polysaccharides 150 MG capsule Commonly known as: NIFEREX Take 1 capsule (150 mg total) by mouth daily.   levothyroxine 75 MCG tablet Commonly known as: SYNTHROID Take 75 mcg by mouth daily before breakfast.   lisinopril 10 MG tablet Commonly known as: ZESTRIL Take 10 mg by mouth daily.   metFORMIN 1000 MG tablet Commonly known as: GLUCOPHAGE Take 1,000 mg by mouth 2 (two) times daily with a meal.   oxyCODONE 5 MG immediate release tablet Commonly known as: Oxy IR/ROXICODONE Take 1 tablet (5 mg total) by mouth every 6 (six) hours as needed for moderate pain (pain score 4-6).   oxymetazoline 0.05 % nasal [place]oup[place] Commonly known as: AFRIN Place 1 Streeper into both nostrils 2 (two) times daily.   pioglitazone 45 MG tablet Commonly known as: ACTOS Take 45 mg by mouth daily.   sitaGLIPtin 50 MG tablet Commonly known as: JANUVIA Take 50 mg by mouth daily.    tamsulosin 0.4 MG Caps capsule Commonly known as: FLOMAX Take 0.4 mg by mouth daily.        Follow-up Information     Dedra Skeens, PA-C Follow up in 1 week(s).   Specialty: Orthopedic Surgery Why: For staple removal on the left shoulder and x-rays. Contact information: [address] [place] [address] [phone]                Discharge Exam: Ceasar Mons Weights   03/12/23 0500 03/13/23 0913 03/17/23 0700  Weight: 107 kg 100.7 kg 104.6 kg   General -Elderly obese Caucasian male, lying in no apparent distress HEENT - PERRLA, EOMI, atraumatic head, non tender sinuses. Lung -bibasilar Rales improved. Heart - S1, S2 heard, no murmurs, rubs, trace pedal edema Neuro - Alert, awake and oriented x 3, non focal exam. Skin - Warm and dry. Bilateral arm slings removed.  Condition at discharge: stable  The results of significant diagnostics from this hospitalization (including imaging, microbiology, ancillary and laboratory) are listed below for reference.   Imaging  Studies: DG Chest Port 1 View  Result Date: 03/19/2023 CLINICAL DATA:  Hypoxia EXAM: PORTABLE CHEST 1 VIEW COMPARISON:  03/08/2023 FINDINGS: Bibasilar discoid atelectasis. No pneumothorax or pleural effusion. Cardiac size within normal limits. Pulmonary vascularity is normal. Left total shoulder arthroplasty has been performed. No acute bone abnormality. IMPRESSION: 1. Bibasilar discoid atelectasis. Electronically Signed   By: Helyn Numbers M.D.   On: 03/19/2023 18:57   DG Shoulder 1V Right  Result Date: 03/17/2023 CLINICAL DATA:  Status post reverse total right shoulder arthroplasty today. EXAM: RIGHT SHOULDER - 1 VIEW COMPARISON:  CT right shoulder 03/13/2023 FINDINGS: Interval reverse total right shoulder arthroplasty. No perihardware lucency is seen to indicate hardware failure or loosening. Expected postoperative subcutaneous and subacromial/subdeltoid bursal air. Lateral surgical skin  staples. No acute fracture or dislocation. IMPRESSION: Interval reverse total right shoulder arthroplasty without evidence of hardware failure or loosening. Electronically Signed   By: Neita Garnet M.D.   On: 03/17/2023 19:02   DG Shoulder 1V Left  Result Date: 03/17/2023 CLINICAL DATA:  Status post reverse total left arthroplasty performed last week. EXAM: LEFT SHOULDER COMPARISON:  Left shoulder radiographs 03/13/2023, CT left shoulder FINDINGS: Redemonstration of reverse total left shoulder arthroplasty. On frontal view there is normal alignment. No perihardware lucency to indicate hardware failure or loosening. Mild cortical step-off at the inferolateral left humeral head corresponding to residua of the acute fracture seen on prior 03/10/2023 CT. Mild acromioclavicular joint space narrowing and peripheral osteophytosis. Mild superior left shoulder subcutaneous air is again seen. There are superolateral left shoulder surgical skin staples. IMPRESSION: Status post reverse total left shoulder arthroplasty without evidence of hardware failure or loosening. Electronically Signed   By: Neita Garnet M.D.   On: 03/17/2023 18:08   CT SHOULDER RIGHT WO CONTRAST  Result Date: 03/13/2023 CLINICAL DATA:  Right humerus fracture. 82 year old male who sustained a right 3 part minimally displaced proximal humerus fracture and left 4 part proximal humerus fracture EXAM: CT OF THE UPPER RIGHT EXTREMITY WITHOUT CONTRAST TECHNIQUE: Multidetector CT imaging of the upper right extremity was performed according to the standard protocol. RADIATION DOSE REDUCTION: This exam was performed according to the departmental dose-optimization program which includes automated exposure control, adjustment of the mA and/or kV according to patient size and/or use of iterative reconstruction technique. COMPARISON:  Right shoulder radiographs 03/08/2023 and 03/13/2023 FINDINGS: Bones/Joint/Cartilage There is diffuse decreased bone  mineralization. There is an acute, comminuted fracture of the right proximal humerus involving the humeral head, surgical neck, and greater and lesser tuberosities. There is posterior subluxation of the humeral head, which rests on the posterior aspect of the glenoid, where there is a triangular bone defect/depression within the medial humeral head (axial series 3 images 32 through 47) measuring up to approximately 2.4 cm in depth. There is up to approximately 4 mm cortical step-off at the lateral aspect of the surgical neck (coronal series 6 image 113). Up to 6 mm diastasis of the greater tuberosity fractures within the superolateral humeral head (coronal series 6, image 118). Up to approximately 1.4 cm impaction of the anterior aspect of the surgical neck into the inferior aspect of the humeral head (sagittal series 8, image 48). There is a tiny defect measuring up to 3 mm in depth at the posteroinferior aspect of the glenoid adjacent to the posterior subluxed humeral head that may represent a tiny corner fracture (axial series 2, image 86). Severe acromioclavicular joint space narrowing with subchondral sclerosis and mild-to-moderate peripheral osteophytosis. Ligaments Suboptimally assessed  by CT. Muscles and Tendons Mild-to-moderate supraspinatus muscle atrophy. Heterogeneous likely blood products extending into the subscapularis muscle anterior to the proximal humeral fractures. Soft tissues Mild fluid within the subacromial subdeltoid bursa. Scattered linear and ground-glass subsegmental atelectasis within the visualized aspect of the right upper lung. Trace pericardial fluid. IMPRESSION: 1. Acute, comminuted fracture of the right proximal humerus involving the humeral head, surgical neck, and greater and lesser tuberosities. There is posterior subluxation of the humeral head, which rests on the posterior aspect of the glenoid, where there is a triangular bone defect/depression within the medial humeral head  junction with the lesser tuberosity, measuring up to approximately 2.4 cm in depth. 2. Up to approximately 1.4 cm craniocaudal impaction of the anterior aspect of the surgical neck into the inferior aspect of the humeral head. 3. There is a tiny defect measuring up to 3 mm in depth at the posteroinferior aspect of the glenoid adjacent to the posterior subluxed humeral head that may represent a tiny corner fracture. Electronically Signed   By: Neita Garnet M.D.   On: 03/13/2023 19:38   DG Shoulder Right Port  Result Date: 03/13/2023 CLINICAL DATA:  Proximal humeral fracture. EXAM: RIGHT SHOULDER - 1 VIEW COMPARISON:  03/08/2023 FINDINGS: Comminuted fractures of the right humeral head and neck. Mildly displaced greater trochanteric fragment. Overlap of the humeral head and glenoid suggests posterior dislocation at the glenohumeral joint. Acromioclavicular and coracoclavicular spaces are normal. Soft tissues are unremarkable. IMPRESSION: Posterior dislocation of the right shoulder with displaced comminuted fractures of the humeral head and neck. Electronically Signed   By: Burman Nieves M.D.   On: 03/13/2023 18:43   DG Shoulder Left Port  Result Date: 03/13/2023 CLINICAL DATA:  Left shoulder fracture status post arthroplasty EXAM: LEFT SHOULDER COMPARISON:  03/08/2023 FINDINGS: Internal rotation, external rotation, transscapular views of the left shoulder are obtained. Interval placement of a left total shoulder arthroplasty in the expected position without evidence of acute complication. Postsurgical changes in the overlying soft tissues. Left chest is clear. IMPRESSION: 1. Unremarkable left shoulder arthroplasty. Electronically Signed   By: Sharlet Salina M.D.   On: 03/13/2023 18:41   CT SHOULDER LEFT WO CONTRAST  Result Date: 03/10/2023 CLINICAL DATA:  Left shoulder pain.  Proximal humerus fracture. EXAM: CT OF THE UPPER LEFT EXTREMITY WITHOUT CONTRAST TECHNIQUE: Multidetector CT imaging of the upper  left extremity was performed according to the standard protocol. RADIATION DOSE REDUCTION: This exam was performed according to the departmental dose-optimization program which includes automated exposure control, adjustment of the mA and/or kV according to patient size and/or use of iterative reconstruction technique. COMPARISON:  Left shoulder x-rays dated March 08, 2023. FINDINGS: Bones/Joint/Cartilage Acute comminuted fracture of the left proximal humerus involving the surgical neck, humeral head, and greater and lesser tuberosities. There is impaction at the neck measuring approximately 2.8 cm, with 1.4 cm anterior displacement. The humeral head is dislocated posteriorly with respect to the glenoid. The glenoid appears intact. Mild degenerative changes of the acromioclavicular joint. Large glenohumeral hemarthrosis. Ligaments Ligaments are suboptimally evaluated by CT. Muscles and Tendons Grossly intact.  No muscle atrophy. Soft tissue No fluid collection or hematoma. No soft tissue mass. Visualized left lung is clear. IMPRESSION: 1. Acute comminuted fracture-dislocation of the left proximal humerus as described above. 2. Large glenohumeral hemarthrosis. Electronically Signed   By: Obie Dredge M.D.   On: 03/10/2023 18:43   US RENAL  Result Date: 03/10/2023 CLINICAL DATA:  UTI. EXAM: RENAL / URINARY TRACT ULTRASOUND  COMPLETE COMPARISON:  None Available. FINDINGS: Right Kidney: Renal measurements: 11.5 cm x 6.3 cm x 6.0 cm = volume: 228.6 mL. Echogenicity within normal limits. No mass or hydronephrosis visualized. Left Kidney: Renal measurements: 12.9 cm x 6.8 cm x 5.3 cm = volume: 239.3 mL. Echogenicity within normal limits. No mass or hydronephrosis visualized. Bladder: Appears normal for degree of bladder distention. The bilateral ureteral jets are visualized. Other: None. IMPRESSION: Unremarkable renal ultrasound. Electronically Signed   By: Aram Candela M.D.   On: 03/10/2023 01:36   DG  Shoulder Left  Result Date: 03/08/2023 CLINICAL DATA:  Left shoulder pain. EXAM: LEFT SHOULDER - 2+ VIEW COMPARISON:  None Available. FINDINGS: A comminuted fracture is seen involving the left humeral head and neck. The bilateral view shows posterior subluxation of the humeral head, with anterior displacement of the humeral neck. IMPRESSION: Comminuted fracture of left humeral head and neck. Posterior subluxation of the humeral head, with anterior displacement of the humeral neck. Electronically Signed   By: Danae Orleans M.D.   On: 03/08/2023 11:41   DG Shoulder Right  Result Date: 03/08/2023 CLINICAL DATA:  Right shoulder pain. EXAM: RIGHT SHOULDER - 2+ VIEW COMPARISON:  None Available. FINDINGS: A mildly displaced, comminuted fracture is seen involving the right humeral head and neck. No evidence of dislocation. IMPRESSION: Mildly displaced, comminuted fracture of the right humeral head and neck. Electronically Signed   By: Danae Orleans M.D.   On: 03/08/2023 11:39   CT Head Wo Contrast  Result Date: 03/08/2023 CLINICAL DATA:  Hypoxia and fever.  Mental status change. EXAM: CT HEAD WITHOUT CONTRAST CT MAXILLOFACIAL WITHOUT CONTRAST TECHNIQUE: Multidetector CT imaging of the head and maxillofacial structures were performed using the standard protocol without intravenous contrast. Multiplanar CT image reconstructions of the maxillofacial structures were also generated. RADIATION DOSE REDUCTION: This exam was performed according to the departmental dose-optimization program which includes automated exposure control, adjustment of the mA and/or kV according to patient size and/or use of iterative reconstruction technique. COMPARISON:  None Available. FINDINGS: CT HEAD FINDINGS Brain: No evidence of acute infarction, hemorrhage, hydrocephalus, extra-axial collection or mass lesion/mass effect. Generalized atrophy. Vascular: No hyperdense vessel or unexpected calcification. Skull: Normal. Negative for fracture  or focal lesion. CT MAXILLOFACIAL FINDINGS Osseous: No fracture or mandibular dislocation. No destructive process. Orbits: Negative. No traumatic or inflammatory finding. Sinuses: Prior endoscopic sinus surgery.  No hemosinus. Soft tissues: No acute finding IMPRESSION: 1. No acute finding or specific cause for symptoms. 2. Generalized brain atrophy. Electronically Signed   By: Tiburcio Pea M.D.   On: 03/08/2023 08:34   CT Maxillofacial W Contrast  Result Date: 03/08/2023 CLINICAL DATA:  Hypoxia and fever.  Mental status change. EXAM: CT HEAD WITHOUT CONTRAST CT MAXILLOFACIAL WITHOUT CONTRAST TECHNIQUE: Multidetector CT imaging of the head and maxillofacial structures were performed using the standard protocol without intravenous contrast. Multiplanar CT image reconstructions of the maxillofacial structures were also generated. RADIATION DOSE REDUCTION: This exam was performed according to the departmental dose-optimization program which includes automated exposure control, adjustment of the mA and/or kV according to patient size and/or use of iterative reconstruction technique. COMPARISON:  None Available. FINDINGS: CT HEAD FINDINGS Brain: No evidence of acute infarction, hemorrhage, hydrocephalus, extra-axial collection or mass lesion/mass effect. Generalized atrophy. Vascular: No hyperdense vessel or unexpected calcification. Skull: Normal. Negative for fracture or focal lesion. CT MAXILLOFACIAL FINDINGS Osseous: No fracture or mandibular dislocation. No destructive process. Orbits: Negative. No traumatic or inflammatory finding. Sinuses: Prior endoscopic sinus  surgery.  No hemosinus. Soft tissues: No acute finding IMPRESSION: 1. No acute finding or specific cause for symptoms. 2. Generalized brain atrophy. Electronically Signed   By: Tiburcio Pea M.D.   On: 03/08/2023 08:34   DG Chest Port 1 View  Result Date: 03/08/2023 CLINICAL DATA:  82 year old male with history of shortness of breath. EXAM:  PORTABLE CHEST 1 VIEW COMPARISON:  No priors. FINDINGS: Lung volumes are low. Diffuse interstitial prominence and peribronchial cuffing concerning for an acute bronchitis. Patchy ill-defined opacities also noted throughout the mid to lower lungs bilaterally, which may suggest developing multilobar bilateral bronchopneumonia. No definite pleural effusions. No pneumothorax. No evidence of pulmonary edema. Heart size is normal. Upper mediastinal contours are distorted by patient positioning. Atherosclerotic calcifications in the thoracic aorta. IMPRESSION: 1. The appearance the chest is concerning for bronchitis likely with developing multilobar bilateral bronchopneumonia, as above. 2. Aortic atherosclerosis. Electronically Signed   By: Trudie Reed M.D.   On: 03/08/2023 06:37    Microbiology: Results for orders placed or performed during the hospital encounter of 03/08/23  Culture, blood (routine x 2)     Status: None   Collection Time: 03/08/23  6:15 AM   Specimen: BLOOD  Result Value Ref Range Status   Specimen Description BLOOD LEFT ANTECUBITAL  Final   Special Requests   Final    BOTTLES DRAWN AEROBIC AND ANAEROBIC Blood Culture adequate volume   Culture   Final    NO GROWTH 5 DAYS Performed at [facility], [address], [place], [place] [address]    Report Status 03/13/2023 FINAL  Final  Culture, blood (routine x 2)     Status: None   Collection Time: 03/08/23  6:20 AM   Specimen: BLOOD  Result Value Ref Range Status   Specimen Description BLOOD RIGHT ANTECUBITAL  Final   Special Requests   Final    BOTTLES DRAWN AEROBIC AND ANAEROBIC Blood Culture adequate volume   Culture   Final    NO GROWTH 5 DAYS Performed at [facility], [address] [place], [place] [address]    Report Status 03/13/2023 FINAL  Final  Resp panel by RT-PCR (RSV, Flu A&B, Covid) Anterior Nasal Swab     Status: None   Collection Time: 03/08/23  6:28 AM   Specimen: Anterior Nasal Swab   Result Value Ref Range Status   SARS Coronavirus 2 by RT PCR NEGATIVE NEGATIVE Final    Comment: (NOTE) SARS-CoV-2 target nucleic acids are NOT DETECTED.  The SARS-CoV-2 RNA is generally detectable in upper respiratory specimens during the acute phase of infection. The lowest concentration of SARS-CoV-2 viral copies this assay can detect is 138 copies/mL. A negative result does not preclude SARS-Cov-2 infection and should not be used as the sole basis for treatment or other patient management decisions. A negative result may occur with  improper specimen collection/handling, submission of specimen other than nasopharyngeal swab, presence of viral mutation(s) within the areas targeted by this assay, and inadequate number of viral copies(<138 copies/mL). A negative result must be combined with clinical observations, patient history, and epidemiological information. The expected result is Negative.  Fact Sheet for Patients:  BloggerCourse.com  Fact Sheet for Healthcare Providers:  SeriousBroker.it  This test is no t yet approved or cleared by the [place] FDA and  has been authorized for detection and/or diagnosis of SARS-CoV-2 by FDA under an Emergency Use Authorization (EUA). This EUA will remain  in effect (meaning this test can be  used) for the duration of the COVID-19 declaration under Section 564(b)(1) of the Act, 21 U.S.C.section 360bbb-3(b)(1), unless the authorization is terminated  or revoked sooner.       Influenza A by PCR NEGATIVE NEGATIVE Final   Influenza B by PCR NEGATIVE NEGATIVE Final    Comment: (NOTE) The Xpert Xpress SARS-CoV-2/FLU/RSV plus assay is intended as an aid in the diagnosis of influenza from Nasopharyngeal swab specimens and should not be used as a sole basis for treatment. Nasal washings and aspirates are unacceptable for Xpert Xpress SARS-CoV-2/FLU/RSV testing.  Fact Sheet for  Patients: BloggerCourse.com  Fact Sheet for Healthcare Providers: SeriousBroker.it  This test is not yet approved or cleared by the [place] FDA and has been authorized for detection and/or diagnosis of SARS-CoV-2 by FDA under an Emergency Use Authorization (EUA). This EUA will remain in effect (meaning this test can be used) for the duration of the COVID-19 declaration under Section 564(b)(1) of the Act, 21 U.S.C. section 360bbb-3(b)(1), unless the authorization is terminated or revoked.     Resp Syncytial Virus by PCR NEGATIVE NEGATIVE Final    Comment: (NOTE) Fact Sheet for Patients: BloggerCourse.com  Fact Sheet for Healthcare Providers: SeriousBroker.it  This test is not yet approved or cleared by the [place] FDA and has been authorized for detection and/or diagnosis of SARS-CoV-2 by FDA under an Emergency Use Authorization (EUA). This EUA will remain in effect (meaning this test can be used) for the duration of the COVID-19 declaration under Section 564(b)(1) of the Act, 21 U.S.C. section 360bbb-3(b)(1), unless the authorization is terminated or revoked.  Performed at [facility], [address] [place], [place] [address]   Urine Culture     Status: None   Collection Time: 03/08/23  9:55 AM   Specimen: Urine, Random  Result Value Ref Range Status   Specimen Description   Final    URINE, RANDOM Performed at [facility], [address] [place], [place] [address]    Special Requests   Final    NONE Reflexed from [id number] Performed at [facility], [address], [place], [place] [address]    Culture   Final    NO GROWTH Performed at [facility], [address], [place], [place] [address]    Report Status 03/09/2023 FINAL  Final  MRSA Next Gen by PCR, Nasal     Status: None   Collection Time: 03/08/23  1:21 PM    Specimen: Nasal Mucosa; Nasal Swab  Result Value Ref Range Status   MRSA by PCR Next Gen NOT DETECTED NOT DETECTED Final    Comment: (NOTE) The GeneXpert MRSA Assay (FDA approved for NASAL specimens only), is one component of a comprehensive MRSA colonization surveillance program. It is not intended to diagnose MRSA infection nor to guide or monitor treatment for MRSA infections. Test performance is not FDA approved in patients less than 56 years old. Performed at [facility], [address] [place], [place] [address]     Labs: CBC: Recent Labs  Lab 03/16/23 [phone] 03/17/23 0314 03/18/23 0801 03/19/23 0416 03/20/23 0439  WBC 61.9* 58.3* 56.6* 66.7* 69.6*  HGB 9.0* 8.8* 10.4* 8.5* 8.1*  HCT 27.4* 26.5* 30.8* 26.4* 24.7*  MCV 87.8 88.0 86.8 90.7 90.1  PLT 397 410* 367 461* 480*   Basic Metabolic Panel: Recent Labs  Lab 03/14/23 0450 03/15/23 0616 03/16/23 0437 03/17/23 0314 03/18/23 0801 03/19/23 0416 03/20/23 0439  NA 131* 128* 130* 128* 128*  129* 129*  K 4.7 4.3 3.8 3.7 4.3 4.0 4.0  CL 99 95* 97* 93* 96* 95* 97*  CO2 23 26 27 27 24 25 23   GLUCOSE 177* 202* 185* 254* 253* 242* 199*  BUN 16 13 16 16 16 11 12   CREATININE 0.64 0.73 0.68 0.74 0.67 0.66 0.63  CALCIUM 7.8* 8.1* 8.1* 8.1* 7.9* 7.7* 7.7*  MG 2.1 1.7 1.7 1.9 1.8 1.6* 1.9  PHOS 3.2 2.4* 2.8 3.1 2.5  --   --    Liver Function Tests: Recent Labs  Lab 03/14/23 0450 03/15/23 0616 03/16/23 0437 03/17/23 0314 03/18/23 0801  AST 46* 27 28 48* 36  ALT 98* 67* 58* 76* 62*  ALKPHOS 51 56 57 58 56  BILITOT 1.2 1.5* 0.8 1.0 0.3  PROT 5.7* 5.7* 5.3* 5.9* 5.4*  ALBUMIN 2.8* 2.7* 2.2* 2.5* 2.3*   CBG: Recent Labs  Lab 03/19/23 0754 03/19/23 1121 03/19/23 1651 03/19/23 2107 03/20/23 0757  GLUCAP 226* 204* 220* 213* 218*    Discharge time spent: 43 minutes.  Signed: Marcelino Duster, MD [facility] 03/20/2023

## 2023-03-20 NOTE — Progress Notes (Signed)
*  PRELIMINARY RESULTS* Echocardiogram 2D Echocardiogram has been performed.  Carolyne Fiscal 03/20/2023, 10:31 AM

## 2023-03-20 NOTE — TOC Progression Note (Signed)
Transition of Care [facility]) - Progression Note    Patient Details  Name: Philip Robbins MRN: [id number] Date of Birth: 1941-01-08  Transition of Care [facility]) CM/SW Contact  Marlowe Sax, RN Phone Number: 03/20/2023, 9:43 AM  Clinical Narrative:    Hope with [facility] called and stated that they will need a 3 day qualifying stay, I explained he admitted 12 days ago on 03/08/23    Expected Discharge Plan: Home w Home Health Services Barriers to Discharge: Continued Medical Work up  Expected Discharge Plan and Services   Discharge Planning Services: CM Consult Post Acute Care Choice: Home Health Living arrangements for the past 2 months: Single Family Home                   DME Agency: NA       HH Arranged: PT, OT HH Agency: [facility] Date HH Agency Contacted: 03/18/23 Time HH Agency Contacted: 1505 Representative spoke with at [facility] Agency: [place]   Social Determinants of Health (SDOH) Interventions SDOH Screenings   Food Insecurity: No Food Insecurity (03/10/2023)  Housing: Low Risk  (03/10/2023)  Transportation Needs: No Transportation Needs (03/10/2023)  Utilities: Not At Risk (03/10/2023)  Tobacco Use: Low Risk  (03/13/2023)    Readmission Risk Interventions     No data to display

## 2023-03-20 NOTE — Progress Notes (Signed)
Occupational Therapy Treatment Patient Details Name: Philip Robbins MRN: [id number] DOB: Feb 18, 1941 Today's Date: 03/20/2023   History of present illness Pt is an 82 y/o M admitted on 03/08/23 after presenting with c/o altered mentation & hypoxia. Pt was previously diagnosed with UTI at Urgent Care. Pt was found to have sepsis, acute hypoxic respiratory failure 2/2 PNA & COPD/asthma exacerbation. Pt also found to have bilateral humeral fxs. Pt is s/p L reverse TSA & L biceps tenodesis on 03/13/23 & R reverse shoulder arthroplasty on 03/17/23. PMH: CLL, BPH, HTN, IIDM, hypothyroidism, HLD, chronic leukocytosis   OT comments  Chart reviewed to date, OT/PT co tx for additional family training and to facilitate improved mobility/ADL completion. Pt other daughter in room and receptive to education, demonstrates appropriate sling management. Pt with improvements in functional mobility tolerance amb in hallway with supervision-CGA, STS completed with supervision-CGA also an improvement from previous attempts. Pt demoed/ provided education re: HEP per physician protocol, will benefit from additional education. Pt is making progress towards goals and will benefit from acute/post acute OT to address deficits.       If plan is discharge home, recommend the following:  A lot of help with walking and/or transfers;A lot of help with bathing/dressing/bathroom;Assistance with cooking/housework;Direct supervision/assist for medications management;Assist for transportation;Direct supervision/assist for financial management;Help with stairs or ramp for entrance;Assistance with feeding   Equipment Recommendations  BSC/3in1;Wheelchair (measurements OT);Tub/shower bench;Other (comment);Hospital bed    Recommendations for Other Services      Precautions / Restrictions Precautions Precautions: Fall;Shoulder Type of Shoulder Precautions: QQV:[id number] arm to remain in sling and abduction pillow with arm at neutral at all  times except RoM exercises and hygiene. Can perform pendulums, elbow/wrist/hand RoM exercises. Can perform active elbow flexion with arm adducted (elbow by side) to allow for ADLs. Passive RoM allowed to 90 FF and 30 ER. ; PPI:RJJOACZYS arm to remain in sling and abduction pillow with arm at neutral at all times except RoM exercises and hygiene. Can perform pendulums, elbow/wrist/hand RoM exercises. Passive RoM allowed to 90 FF and 30 ER. Shoulder Interventions: Shoulder sling/immobilizer;At all times;Shoulder abduction pillow;Off for dressing/bathing/exercises Precaution Booklet Issued: Yes (comment) Precaution Comments: S/P BUE TSA Required Braces or Orthoses: Sling Restrictions Weight Bearing Restrictions: Yes RUE Weight Bearing: Non weight bearing LUE Weight Bearing: Non weight bearing       Mobility Bed Mobility Overal bed mobility: Needs Assistance Bed Mobility: Supine to Sit     Supine to sit: Min assist, HOB elevated, +2 for safety/equipment          Transfers Overall transfer level: Needs assistance Equipment used: None Transfers: Sit to/from Stand Sit to Stand: Contact guard assist, Supervision                 Balance Overall balance assessment: Needs assistance Sitting-balance support: Feet supported Sitting balance-Leahy Scale: Good     Standing balance support: No upper extremity supported, During functional activity Standing balance-Leahy Scale: Fair                             ADL either performed or assessed with clinical judgement   ADL Overall ADL's : Needs assistance/impaired Eating/Feeding: Sitting;Maximal assistance               Upper Body Dressing : Maximal assistance;Cueing for UE precautions;Adhering to UE precautions Upper Body Dressing Details (indicate cue type and reason): other daugther demoed approrpiate performance with slings Lower Body Dressing:  Maximal assistance   Toilet Transfer: Contact guard  Marine scientist Details (indicate cue type and reason): simulated         Functional mobility during ADLs: Contact guard assist      Extremity/Trunk Assessment              Vision       Perception     Praxis      Cognition Arousal: Alert Behavior During Therapy: WFL for tasks assessed/performed Overall Cognitive Status: Within Functional Limits for tasks assessed                                          Exercises Other Exercises Other Exercises: pt other daugther provided education re: post acute care needs, sling managment, ADL performance within current level of functioning    Shoulder Instructions       General Comments spo2 on RA down to 87% with amb, recovered to >90% on RA with PLB within 30 seconds; HR up to 110s bpm with mobility    Pertinent Vitals/ Pain       Pain Assessment Pain Assessment: No/denies pain  Home Living                                          Prior Functioning/Environment              Frequency  Min 1X/week        Progress Toward Goals  OT Goals(current goals can now be found in the care plan section)  Progress towards OT goals: Progressing toward goals     Plan      Co-evaluation    PT/OT/SLP Co-Evaluation/Treatment: Yes Reason for Co-Treatment: Complexity of the patient's impairments (multi-system involvement);Necessary to address cognition/behavior during functional activity;For patient/therapist safety;To address functional/ADL transfers   OT goals addressed during session: ADL's and self-care      AM-PAC OT "6 Clicks" Daily Activity     Outcome Measure   Help from another person eating meals?: A Lot Help from another person taking care of personal grooming?: A Lot Help from another person toileting, which includes using toliet, bedpan, or urinal?: A Lot Help from another person bathing (including washing, rinsing, drying)?: A Lot Help from another  person to put on and taking off regular upper body clothing?: A Lot Help from another person to put on and taking off regular lower body clothing?: A Lot 6 Click Score: 12    End of Session Equipment Utilized During Treatment: Oxygen;Gait belt;Other (comment) (B slings)  OT Visit Diagnosis: Muscle weakness (generalized) (M62.81);Other abnormalities of gait and mobility (R26.89)   Activity Tolerance Patient tolerated treatment well   Patient Left with call bell/phone within reach;with family/visitor present;in chair   Nurse Communication Mobility status        Time: 1914-7829 OT Time Calculation (min): 25 min  Charges: OT General Charges $OT Visit: 1 Visit OT Treatments $Self Care/Home Management : 8-22 mins  Oleta Mouse, OTD OTR/L  03/20/23, 10:13 AM

## 2023-03-20 NOTE — Consult Note (Signed)
PHARMACY CONSULT NOTE - ELECTROLYTES  Pharmacy Consult for Electrolyte Monitoring and Replacement   Recent Labs: Potassium (mmol/L)  Date Value  03/20/2023 4.0   Magnesium (mg/dL)  Date Value  65/78/4696 1.9   Calcium (mg/dL)  Date Value  29/52/8413 7.7 (L)   Albumin (g/dL)  Date Value  24/40/1027 2.3 (L)   Phosphorus (mg/dL)  Date Value  25/36/6440 2.5   Sodium (mmol/L)  Date Value  03/20/2023 129 (L)    Height: 5' 7.5" (171.5 cm) Weight: 104.6 kg (230 lb 9.6 oz) IBW/kg (Calculated) : 67.25 Estimated Creatinine Clearance: 85.6 mL/min (by C-G formula based on SCr of 0.63 mg/dL).  Assessment  Philip Robbins is a 82 y.o. male presenting with pneumonia and hyponatremia. PMH significant for CLL, BPH, HTN, DM, hypothyroidism, HLD. Pharmacy has been consulted to monitor and replace electrolytes. Sodium is low, but improving.   Pertinent medications: lisinopril 20 mg daily, lasix 40 mg daily PO (new start)  Diet: regular, thin  Goal of Therapy: Electrolytes  WNL  Plan:  No replacement needed.  F/u with AM labs.   Thank you for allowing pharmacy to be a part of this patient's care.  Ronnald Ramp, PharmD, BCPS Clinical Pharmacist 03/20/2023 8:52 AM

## 2023-03-23 ENCOUNTER — Encounter: Payer: Self-pay | Admitting: Student

## 2023-03-23 ENCOUNTER — Encounter: Payer: Self-pay | Admitting: Internal Medicine

## 2023-03-23 ENCOUNTER — Non-Acute Institutional Stay (SKILLED_NURSING_FACILITY): Payer: Self-pay | Admitting: Student

## 2023-03-23 DIAGNOSIS — E871 Hypo-osmolality and hyponatremia: Secondary | ICD-10-CM

## 2023-03-23 DIAGNOSIS — A419 Sepsis, unspecified organism: Secondary | ICD-10-CM

## 2023-03-23 DIAGNOSIS — N390 Urinary tract infection, site not specified: Secondary | ICD-10-CM

## 2023-03-23 DIAGNOSIS — G9341 Metabolic encephalopathy: Secondary | ICD-10-CM | POA: Diagnosis not present

## 2023-03-23 DIAGNOSIS — S42301A Unspecified fracture of shaft of humerus, right arm, initial encounter for closed fracture: Secondary | ICD-10-CM | POA: Diagnosis not present

## 2023-03-23 DIAGNOSIS — B379 Candidiasis, unspecified: Secondary | ICD-10-CM

## 2023-03-23 DIAGNOSIS — C9110 Chronic lymphocytic leukemia of B-cell type not having achieved remission: Secondary | ICD-10-CM

## 2023-03-23 DIAGNOSIS — J189 Pneumonia, unspecified organism: Secondary | ICD-10-CM

## 2023-03-23 DIAGNOSIS — S42302A Unspecified fracture of shaft of humerus, left arm, initial encounter for closed fracture: Secondary | ICD-10-CM

## 2023-03-23 DIAGNOSIS — E119 Type 2 diabetes mellitus without complications: Secondary | ICD-10-CM | POA: Insufficient documentation

## 2023-03-23 DIAGNOSIS — L98429 Non-pressure chronic ulcer of back with unspecified severity: Secondary | ICD-10-CM | POA: Insufficient documentation

## 2023-03-23 LAB — BASIC METABOLIC PANEL
BUN: 11 (ref 4–21)
CO2: 24 — AB (ref 13–22)
Chloride: 94 — AB (ref 99–108)
Creatinine: 0.7 (ref 0.6–1.3)
Glucose: 201
Potassium: 4.4 meq/L (ref 3.5–5.1)
Sodium: 129 — AB (ref 137–147)

## 2023-03-23 LAB — HEPATIC FUNCTION PANEL
ALT: 48 U/L — AB (ref 10–40)
AST: 23 (ref 14–40)
Alkaline Phosphatase: 75 (ref 25–125)
Bilirubin, Total: 0.9

## 2023-03-23 LAB — COMPREHENSIVE METABOLIC PANEL
Albumin: 3 — AB (ref 3.5–5.0)
Calcium: 8.5 — AB (ref 8.7–10.7)
Globulin: 2.4
eGFR: 94

## 2023-03-23 NOTE — Group Note (Deleted)

## 2023-03-23 NOTE — Progress Notes (Addendum)
Provider:   Location:  [facility] Room Number: [place] 108-A Place of Service:  SNF (31)  PCP: Marina Goodell, MD Patient Care Team: Marina Goodell, MD as PCP - General (Family Medicine) Earna Coder, MD as Consulting Physician (Internal Medicine) Maryjane Hurter Madaline Guthrie, MD (Family Medicine)  Extended Emergency Contact Information Primary Emergency Contact: Hamblin,[name] Home Phone: [phone] Relation: Spouse Preferred language: English Interpreter needed? No Secondary Emergency Contact: Sana,Cara Mobile Phone: [phone] Relation: Daughter  Code Status: Full Code Goals of Care: Advanced Directive information    03/23/2023   11:26 AM  Advanced Directives  Does Patient Have a Medical Advance Directive? No  Does patient want to make changes to medical advance directive? No - Patient declined  Would patient like information on creating a medical advance directive? No - Patient declined   Chief Complaint  Patient presents with   New Admit To SNF    New Admisson    HPI: Patient is a 82 y.o. male seen today for admission to [facility].   He doesn't remember what happened. His wife provides history. He was laying in the bed making weird noises and was sweaty and wet squirming in the bed. She would speak to him and didn't respond to her communication. She wasn't sure what was happening so she called 911. He was agitated due to confusion and they were holding him now. They gave an x-ray and and he had bilateral shoulder fracture plus some dislocation on August 25. They fixed the left shoulder first and they did the second on tuesdya. He had pneumonia on his chest and thought to be aspiration pneumonia. He only complained about dysuria one day. He was on oxygen for the pnuemonia. His sodium was also low and they weren't sure why. He had a seizure during that time and that's when he had the seizure. He also had levoquin which could have  lowered his seizure threshold. He has been urinating well despite his BPH. He has had good bowel movements.   Pain is easing up. He has more of an issue with the rash on his bottom which is just as bad as the shoulders.   He has diabetes and CLL. Biopsy on the left side of his face  He is here with his wife. 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 He is totally independent at home. Drives. He naps. She maintains finances. He was planning to retire this month. He managed his on medications. No assistive devices. No falls in the last year.   Past Medical History:  Diagnosis Date   Arthritis    knee and back   BPH (benign prostatic hypertrophy)    Diabetes type 2, controlled (HCC)    DM type 2 (diabetes mellitus, type 2) (HCC)    Environmental allergies    Headache    sinus headaches   HLD (hyperlipidemia)    Hypothyroid    UTI (urinary tract infection)    Past Surgical History:  Procedure Laterality Date   CARDIAC CATHETERIZATION     CARDIAC CATHETERIZATION     no stents placed   CHOLECYSTECTOMY     KNEE ARTHROSCOPY Right 03/16/2015   Procedure: ARTHROSCOPY KNEE WITH PARTIAL MEDIAL MENISECTOMY, AND CHONDROPLASTY;  Surgeon: Erin Sons, MD;  Location: [facility];  Service: Orthopedics;  Laterality: Right;  Diabetic - oral meds   REVERSE SHOULDER ARTHROPLASTY Left 03/13/2023   Procedure: REVERSE SHOULDER ARTHROPLASTY;  Surgeon: Signa Kell, MD;  Location:  [facility];  Service: Orthopedics;  Laterality: Left;   REVERSE SHOULDER ARTHROPLASTY Right 03/17/2023   Procedure: REVERSE SHOULDER ARTHROPLASTY;  Surgeon: Signa Kell, MD;  Location: [facility];  Service: Orthopedics;  Laterality: Right;    reports that he has never smoked. He has never used smokeless tobacco. He reports that he does not drink alcohol and does not use drugs. Social History   Socioeconomic History   Marital status: Married    Spouse name: Not on file   Number of children: Not on file   Years of  education: Not on file   Highest education level: Not on file  Occupational History   Occupation: minister  Tobacco Use   Smoking status: Never   Smokeless tobacco: Never  Vaping Use   Vaping status: Never Used  Substance and Sexual Activity   Alcohol use: No    Alcohol/week: 0.0 standard drinks of alcohol   Drug use: No   Sexual activity: Not on file  Other Topics Concern   Not on file  Social History Narrative   ** Merged History Encounter **       Social Determinants of Health   Financial Resource Strain: Not on file  Food Insecurity: No Food Insecurity (03/10/2023)   Hunger Vital Sign    Worried About Running Out of Food in the Last Year: Never true    Ran Out of Food in the Last Year: Never true  Transportation Needs: No Transportation Needs (03/10/2023)   PRAPARE - Administrator, Civil Service (Medical): No    Lack of Transportation (Non-Medical): No  Physical Activity: Not on file  Stress: Not on file  Social Connections: Not on file  Intimate Partner Violence: Not At Risk (03/10/2023)   Humiliation, Afraid, Rape, and Kick questionnaire    Fear of Current or Ex-Partner: No    Emotionally Abused: No    Physically Abused: No    Sexually Abused: No    Functional Status Survey:    Family History  Problem Relation Age of Onset   Renal Disease Father        ESRD   Diabetes Father    Breast cancer Sister     Health Maintenance  Topic Date Due   Medicare Annual Wellness (AWV)  Never done   FOOT EXAM  Never done   OPHTHALMOLOGY EXAM  Never done   Diabetic kidney evaluation - Urine ACR  Never done   INFLUENZA VACCINE  02/12/2023   COVID-19 Vaccine (5 - 2023-24 season) 03/15/2023   HEMOGLOBIN A1C  09/08/2023   Diabetic kidney evaluation - eGFR measurement  03/19/2024   DTaP/Tdap/Td (2 - Td or Tdap) 04/18/2029   Pneumonia Vaccine 61+ Years old  Completed   Zoster Vaccines- Shingrix  Completed   HPV VACCINES  Aged Out    No Known  Allergies  Outpatient Encounter Medications as of 03/23/2023  Medication Sig   acetaminophen (TYLENOL) 500 MG tablet Take 500 mg by mouth every 8 (eight) hours as needed for mild pain.   ascorbic acid (VITAMIN C) 500 MG tablet Take 1 tablet (500 mg total) by mouth daily.   atorvastatin (LIPITOR) 40 MG tablet Take 40 mg by mouth daily.   bisacodyl (DULCOLAX) 10 MG suppository Place 1 suppository (10 mg total) rectally daily as needed for moderate constipation.   cyanocobalamin 1000 MCG tablet Take 1 tablet (1,000 mcg total) by mouth daily.   docusate sodium (COLACE) 100 MG capsule Take 1 capsule (100 mg total) by  mouth 2 (two) times daily.   fluticasone (FLONASE) 50 MCG/ACT nasal Marcom Place 2 sprays into both nostrils every evening.   folic acid (FOLVITE) 1 MG tablet Take 1 tablet (1 mg total) by mouth daily.   [EXPIRED] guaiFENesin (MUCINEX) 600 MG 12 hr tablet Take 1 tablet (600 mg total) by mouth 2 (two) times daily as needed for up to 5 days for to loosen phlegm or cough.   iron polysaccharides (NIFEREX) 150 MG capsule Take 1 capsule (150 mg total) by mouth daily.   levothyroxine (SYNTHROID) 75 MCG tablet Take 75 mcg by mouth daily before breakfast.   lisinopril (PRINIVIL,ZESTRIL) 10 MG tablet Take 10 mg by mouth daily.   metFORMIN (GLUCOPHAGE) 1000 MG tablet Take 1 tablet by mouth 2 (two) times daily with a meal.   oxycodone (OXY-IR) 5 MG capsule Take 5 mg by mouth every 6 (six) hours as needed for pain.   oxymetazoline (AFRIN) 0.05 % nasal Muecke Place 1 Gilmer into both nostrils 2 (two) times daily.   pioglitazone (ACTOS) 45 MG tablet Take 45 mg by mouth daily.   sitaGLIPtin (JANUVIA) 50 MG tablet Take 50 mg by mouth daily.   tamsulosin (FLOMAX) 0.4 MG CAPS capsule Take 0.4 mg by mouth at bedtime.   Vitamin D, Ergocalciferol, (DRISDOL) 1.25 MG (50000 UNIT) CAPS capsule Take 1 capsule (50,000 Units total) by mouth every 7 (seven) days.   ZINC OXIDE EX Apply topically. Apply to buttocks  topically every shift for skin   [DISCONTINUED] pioglitazone (ACTOS) 45 MG tablet Take 45 mg by mouth daily.   [DISCONTINUED] sitaGLIPtin (JANUVIA) 50 MG tablet Take 50 mg by mouth daily.   [DISCONTINUED] acyclovir (ZOVIRAX) 200 MG capsule Take 400 mg by mouth daily.  (Patient not taking: Reported on 02/05/2021)   [DISCONTINUED] aspirin 81 MG EC tablet Take 81 mg by mouth daily. Swallow whole.   [DISCONTINUED] azelastine (ASTELIN) 0.1 % nasal Pondexter Place 1 Lucchetti into both nostrils as needed. Use in each nostril as directed   [DISCONTINUED] benzonatate (TESSALON) 200 MG capsule TAKE 1 CAPSULE (200 MG TOTAL) BY MOUTH 3 (THREE) TIMES DAILY AS NEEDED FOR COUGH. (Patient not taking: Reported on 02/05/2021)   [DISCONTINUED] cetirizine (ZYRTEC) 10 MG tablet Take 10 mg by mouth daily as needed.   [DISCONTINUED] levothyroxine (SYNTHROID) 50 MCG tablet Take by mouth.   [DISCONTINUED] lisinopril (ZESTRIL) 10 MG tablet Take 10 mg by mouth daily.   [DISCONTINUED] metFORMIN (GLUCOPHAGE) 1000 MG tablet Take 1,000 mg by mouth 2 (two) times daily with a meal.   [DISCONTINUED] metFORMIN (GLUCOPHAGE) 500 MG tablet Take by mouth 2 (two) times daily with a meal.   [DISCONTINUED] pravastatin (PRAVACHOL) 40 MG tablet Take 40 mg by mouth daily.   [DISCONTINUED] predniSONE (DELTASONE) 20 MG tablet TAKE 1 TABLET BY MOUTH 2 TIMES DAILY FOR 3 DAYS. (Patient not taking: Reported on 02/05/2021)   [DISCONTINUED] Probiotic Product (PROBIOTIC ACIDOPHILUS) CAPS Take 1 capsule by mouth as needed.    [DISCONTINUED] Propylene Glycol 0.6 % SOLN Apply to eye.   [DISCONTINUED] tamsulosin (FLOMAX) 0.4 MG CAPS capsule Take 0.4 mg by mouth daily.   No facility-administered encounter medications on file as of 03/23/2023.    Review of Systems  Vitals:   03/23/23 1052  BP: (!) 158/61  Pulse: 93  Resp: 18  Temp: 97.7 F (36.5 C)  SpO2: 93%  Weight: 220 lb 12.8 oz (100.2 kg)  Height: 5' 7.5" (1.715 m)   Body mass index is 34.07  kg/m. Physical  Exam Constitutional:      General: He is not in acute distress. Cardiovascular:     Rate and Rhythm: Normal rate and regular rhythm.     Pulses: Normal pulses.  Pulmonary:     Effort: Pulmonary effort is normal.     Breath sounds: Normal breath sounds.  Abdominal:     General: Abdomen is flat.     Palpations: Abdomen is soft.  Skin:    Comments: Gluteal folds with blanching erythema and left cleft, scattered erythema with satellite lesions  Neurological:     General: No focal deficit present.     Mental Status: He is alert and oriented to person, place, and time.  Psychiatric:        Mood and Affect: Mood normal.        Behavior: Behavior normal.     Labs reviewed: Basic Metabolic Panel: Recent Labs    03/16/23 0437 03/17/23 0314 03/18/23 0801 03/19/23 0416 03/20/23 0439  NA 130* 128* 128* 129* 129*  K 3.8 3.7 4.3 4.0 4.0  CL 97* 93* 96* 95* 97*  CO2 27 27 24 25 23   GLUCOSE 185* 254* 253* 242* 199*  BUN 16 16 16 11 12   CREATININE 0.68 0.74 0.67 0.66 0.63  CALCIUM 8.1* 8.1* 7.9* 7.7* 7.7*  MG 1.7 1.9 1.8 1.6* 1.9  PHOS 2.8 3.1 2.5  --   --    Liver Function Tests: Recent Labs    03/16/23 0437 03/17/23 0314 03/18/23 0801  AST 28 48* 36  ALT 58* 76* 62*  ALKPHOS 57 58 56  BILITOT 0.8 1.0 0.3  PROT 5.3* 5.9* 5.4*  ALBUMIN 2.2* 2.5* 2.3*   No results for input(s): "LIPASE", "AMYLASE" in the last 8760 hours. No results for input(s): "AMMONIA" in the last 8760 hours. CBC: Recent Labs    08/07/22 0930 02/05/23 1004 03/08/23 0620 03/09/23 0436 03/18/23 0801 03/19/23 0416 03/20/23 0439  WBC 27.4* 28.6* 35.1*   < > 56.6* 66.7* 69.6*  NEUTROABS 6.3 6.8 19.8*  --   --   --   --   HGB 13.6 13.8 12.1*   < > 10.4* 8.5* 8.1*  HCT 44.0 43.7 36.5*   < > 30.8* 26.4* 24.7*  MCV 93.6 93.2 90.1   < > 86.8 90.7 90.1  PLT 379 302 218   < > 367 461* 480*   < > = values in this interval not displayed.   Cardiac Enzymes: Recent Labs    03/10/23 0430  03/11/23 0418 03/12/23 0450  CKTOTAL 426* 437* 413*   BNP: Invalid input(s): "POCBNP" Lab Results  Component Value Date   HGBA1C 6.7 (H) 03/08/2023   Lab Results  Component Value Date   TSH 2.679 03/08/2023   Lab Results  Component Value Date   VITAMINB12 109 (L) 03/09/2023   Lab Results  Component Value Date   FOLATE 8.3 03/09/2023   Lab Results  Component Value Date   IRON 16 (L) 03/09/2023   TIBC 259 03/09/2023    Imaging and Procedures obtained prior to SNF admission: ECHOCARDIOGRAM COMPLETE  Result Date: 03/20/2023    ECHOCARDIOGRAM REPORT   Patient Name:   STANDLY SHAMES Date of Exam: 03/20/2023 Medical Rec #:  [id number]      Height:       67.5 in Accession #:    [id number]     Weight:       230.6 lb Date of Birth:  November 09, 1940     BSA:  2.160 m Patient Age:    81 years       BP:           167/53 mmHg Patient Gender: M              HR:           97 bpm. Exam Location:  [facility] Procedure: 2D Echo, Cardiac Doppler and Color Doppler Indications:     Dyspnea  History:         Patient has no prior history of Echocardiogram examinations.                  Signs/Symptoms:Dyspnea. Bilateral humeral fractures. Patient                  was not able to lie on his left side. Echo performed with                  patient sitting.  Sonographer:     Mikki Harbor Referring Phys:  [id number] Marcelino Duster Diagnosing Phys: Debbe Odea MD  Sonographer Comments: Technically difficult study due to poor echo windows. IMPRESSIONS  1. Left ventricular ejection fraction, by estimation, is 60 to 65%. The left ventricle has normal function. The left ventricle has no regional wall motion abnormalities. Left ventricular diastolic parameters are consistent with Grade I diastolic dysfunction (impaired relaxation).  2. Right ventricular systolic function is normal. The right ventricular size is mildly enlarged. There is normal pulmonary artery systolic pressure.  3. The mitral valve is normal in  structure. Mild mitral valve regurgitation.  4. The aortic valve is calcified. Aortic valve regurgitation is not visualized. Aortic valve sclerosis/calcification is present, without any evidence of aortic stenosis.  5. The inferior vena cava is normal in size with greater than 50% respiratory variability, suggesting right atrial pressure of 3 mmHg. FINDINGS  Left Ventricle: Left ventricular ejection fraction, by estimation, is 60 to 65%. The left ventricle has normal function. The left ventricle has no regional wall motion abnormalities. The left ventricular internal cavity size was normal in size. There is  no left ventricular hypertrophy. Left ventricular diastolic parameters are consistent with Grade I diastolic dysfunction (impaired relaxation). Right Ventricle: The right ventricular size is mildly enlarged. No increase in right ventricular wall thickness. Right ventricular systolic function is normal. There is normal pulmonary artery systolic pressure. The tricuspid regurgitant velocity is 1.56  m/s, and with an assumed right atrial pressure of 3 mmHg, the estimated right ventricular systolic pressure is 12.7 mmHg. Left Atrium: Left atrial size was normal in size. Right Atrium: Right atrial size was normal in size. Pericardium: There is no evidence of pericardial effusion. Mitral Valve: The mitral valve is normal in structure. Mild mitral valve regurgitation. MV peak gradient, 7.8 mmHg. The mean mitral valve gradient is 3.0 mmHg. Tricuspid Valve: The tricuspid valve is normal in structure. Tricuspid valve regurgitation is mild. Aortic Valve: The aortic valve is calcified. Aortic valve regurgitation is not visualized. Aortic valve sclerosis/calcification is present, without any evidence of aortic stenosis. Aortic valve mean gradient measures 7.5 mmHg. Aortic valve peak gradient measures 14.1 mmHg. Aortic valve area, by VTI measures 2.88 cm. Pulmonic Valve: The pulmonic valve was not well visualized. Pulmonic  valve regurgitation is not visualized. Aorta: The aortic root is normal in size and structure. Venous: The inferior vena cava is normal in size with greater than 50% respiratory variability, suggesting right atrial pressure of 3 mmHg. IAS/Shunts: No atrial level shunt detected by color  flow Doppler.  LEFT VENTRICLE PLAX 2D LVIDd:         5.20 cm   Diastology LVIDs:         2.60 cm   LV e' medial:    6.85 cm/s LV PW:         0.80 cm   LV E/e' medial:  12.0 LV IVS:        0.90 cm   LV e' lateral:   10.80 cm/s LVOT diam:     2.00 cm   LV E/e' lateral: 7.6 LV SV:         97 LV SV Index:   45 LVOT Area:     3.14 cm  RIGHT VENTRICLE RV Basal diam:  3.65 cm RV Mid diam:    3.90 cm RV S prime:     22.90 cm/s TAPSE (M-mode): 2.6 cm LEFT ATRIUM             Index        RIGHT ATRIUM           Index LA diam:        4.10 cm 1.90 cm/m   RA Area:     19.10 cm LA Vol (A2C):   61.7 ml 28.56 ml/m  RA Volume:   52.80 ml  24.44 ml/m LA Vol (A4C):   66.7 ml 30.88 ml/m LA Biplane Vol: 65.6 ml 30.37 ml/m  AORTIC VALVE AV Area (Vmax):    2.41 cm AV Area (Vmean):   2.44 cm AV Area (VTI):     2.88 cm AV Vmax:           188.00 cm/s AV Vmean:          125.000 cm/s AV VTI:            0.338 m AV Peak Grad:      14.1 mmHg AV Mean Grad:      7.5 mmHg LVOT Vmax:         144.00 cm/s LVOT Vmean:        97.200 cm/s LVOT VTI:          0.310 m LVOT/AV VTI ratio: 0.92  AORTA Ao Root diam: 3.70 cm MITRAL VALVE                TRICUSPID VALVE MV Area (PHT): 2.56 cm     TR Peak grad:   9.7 mmHg MV Area VTI:   3.80 cm     TR Vmax:        156.00 cm/s MV Peak grad:  7.8 mmHg MV Mean grad:  3.0 mmHg     SHUNTS MV Vmax:       1.40 m/s     Systemic VTI:  0.31 m MV Vmean:      70.3 cm/s    Systemic Diam: 2.00 cm MV Decel Time: 296 msec MV E velocity: 82.30 cm/s MV A velocity: 125.00 cm/s MV E/A ratio:  0.66 Debbe Odea MD Electronically signed by Debbe Odea MD Signature Date/Time: 03/20/2023/12:51:05 PM    Final     Assessment/Plan 1.  Closed fracture of both humeri, initial encounter Patient with bilateral humerus fractures. Pain well-controlled. Patient is able to ambulate and stand independently, however, due to balance shoulder fractures. F/u with ortho in 2 weeks. Staple removal at that time.   2. Acute metabolic encephalopathy Patient is alert and oriented at this time. Continue delirium precautions.   3. Community acquired pneumonia, unspecified laterality Completed treatment. Off of  oxygen supplementation. Aspiration likely in the setting of a seizure. Continue to monitor. SLP with admission.   4. CLL (chronic lymphocytic leukemia) (HCC) Elevated WBC in the setting of steroid use. Repeat labs on admission. Previously in remission and stable.   5. Hypomagnesemia Noted during admission, repeat labs and replete as indicated.   6. Hyponatremia Low during admission. Potentially contributed to seizure activity. Repeat and replete as indicated. Fluid restriction to 1.5 L/d.   7. Sepsis secondary to UTI (HCC) BPH Infection and treatment likely lowered seizure threshold. S/p treatment. Now without symptoms and normal BP. Completed abx. Continue flomax.   8. Hypophosphatemia Noted during admission. Repeat, and replete as indicated.    9. Diabetes Discussed patient's current medication regimen. Concern for use of pioglitazone for cardiotoxicity and decreasing use of this medication at this time. Most recent A1c 6.7 during hospitalization and is well controlled. Recent use of steroids during hospitalization causing hyperglycemia. Discussed changes at discharge. Continue pioglitazone, metformin, and   10. Candidasis Severe candidal infection vs allergy given first time use of depends. Will plan for fluconazole 150 mg today and in 72 hours due to some concern for yeast involvement as well. Continue barrier cream. Update on 9/13, improving after fluconazole, not a pressure injury.    Family/ staff Communication: Spouse,  nursing  Labs/tests ordered: CBC, BMP, Magnesium, Phos1 week  I spent greater than 50  minutes for the care of this patient in face to face time, chart review, clinical documentation, patient education. I spent an additional 16 minutes discussing goals of care and advanced care planning.

## 2023-03-27 ENCOUNTER — Non-Acute Institutional Stay (SKILLED_NURSING_FACILITY): Payer: Medicare Other | Admitting: Student

## 2023-03-27 ENCOUNTER — Encounter: Payer: Self-pay | Admitting: Student

## 2023-03-27 DIAGNOSIS — K589 Irritable bowel syndrome without diarrhea: Secondary | ICD-10-CM | POA: Diagnosis not present

## 2023-03-27 DIAGNOSIS — E871 Hypo-osmolality and hyponatremia: Secondary | ICD-10-CM | POA: Diagnosis not present

## 2023-03-27 DIAGNOSIS — I159 Secondary hypertension, unspecified: Secondary | ICD-10-CM

## 2023-03-27 NOTE — Progress Notes (Unsigned)
Location:  Other [facility] Room Number: [facility] [address] Place of Service:  SNF [phone]) Provider:  Dr. Earnestine Mealing  PCP: Marina Goodell, MD  Patient Care Team: Marina Goodell, MD as PCP - General (Family Medicine) Earna Coder, MD as Consulting Physician (Internal Medicine) Maryjane Hurter Madaline Guthrie, MD (Family Medicine)  Extended Emergency Contact Information Primary Emergency Contact: Matthis,[name] Home Phone: [phone] Relation: Spouse Preferred language: English Interpreter needed? No Secondary Emergency Contact: Sana,Cara Mobile Phone: [phone] Relation: Daughter  Code Status:  Full Code.  Goals of care: Advanced Directive information    03/27/2023    3:55 PM  Advanced Directives  Does Patient Have a Medical Advance Directive? No  Does patient want to make changes to medical advance directive? No - Patient declined     Chief Complaint  Patient presents with   Acute Visit    Hypertension.     HPI:  Pt is a 82 y.o. male seen today for an acute visit for Hypertension.   Patient denies chest pain or shortness of breath.  Patient has had increased swelling in hands and feet.  Denies shortness of breath.  He has had poor p.o. intake of food primarily had strawberries.  Bowel movement daily with some diarrhea.  Wife is at bedside and assists with history.   Past Medical History:  Diagnosis Date   Arthritis    knee and back   BPH (benign prostatic hypertrophy)    Diabetes type 2, controlled (HCC)    DM type 2 (diabetes mellitus, type 2) (HCC)    Environmental allergies    Headache    sinus headaches   HLD (hyperlipidemia)    Hypothyroid    UTI (urinary tract infection)    Past Surgical History:  Procedure Laterality Date   CARDIAC CATHETERIZATION     CARDIAC CATHETERIZATION     no stents placed   CHOLECYSTECTOMY     KNEE ARTHROSCOPY Right 03/16/2015   Procedure: ARTHROSCOPY KNEE WITH PARTIAL MEDIAL MENISECTOMY, AND  CHONDROPLASTY;  Surgeon: Erin Sons, MD;  Location: [facility];  Service: Orthopedics;  Laterality: Right;  Diabetic - oral meds   REVERSE SHOULDER ARTHROPLASTY Left 03/13/2023   Procedure: REVERSE SHOULDER ARTHROPLASTY;  Surgeon: Signa Kell, MD;  Location: [facility] ORS;  Service: Orthopedics;  Laterality: Left;   REVERSE SHOULDER ARTHROPLASTY Right 03/17/2023   Procedure: REVERSE SHOULDER ARTHROPLASTY;  Surgeon: Signa Kell, MD;  Location: [facility];  Service: Orthopedics;  Laterality: Right;    No Known Allergies  Outpatient Encounter Medications as of 03/27/2023  Medication Sig   acetaminophen (TYLENOL) 500 MG tablet Take 1,000 mg by mouth every 8 (eight) hours as needed for mild pain.   ascorbic acid (VITAMIN C) 500 MG tablet Take 1 tablet (500 mg total) by mouth daily.   atorvastatin (LIPITOR) 40 MG tablet Take 40 mg by mouth daily.   bisacodyl (DULCOLAX) 10 MG suppository Place 1 suppository (10 mg total) rectally daily as needed for moderate constipation.   cyanocobalamin 1000 MCG tablet Take 1 tablet (1,000 mcg total) by mouth daily.   docusate sodium (COLACE) 100 MG capsule Take 1 capsule (100 mg total) by mouth 2 (two) times daily.   fluconazole (DIFLUCAN) 150 MG tablet Take 150 mg by mouth daily. Every Tuesday and Friday   fluticasone (FLONASE) 50 MCG/ACT nasal [place] Place 2 sprays into both nostrils every evening.   guaiFENesin (ROBITUSSIN) 100 MG/5ML liquid Take 10 mLs by mouth every 4 (four) hours as needed for  cough or to loosen phlegm. Give 10ml by mouth once daily   iron polysaccharides (NIFEREX) 150 MG capsule Take 1 capsule (150 mg total) by mouth daily.   levothyroxine (SYNTHROID) 75 MCG tablet Take 75 mcg by mouth daily before breakfast.   lisinopril (PRINIVIL,ZESTRIL) 10 MG tablet Take 10 mg by mouth daily.   metFORMIN (GLUCOPHAGE) 1000 MG tablet Take 1 tablet by mouth 2 (two) times daily with a meal.   nystatin cream (MYCOSTATIN) Apply 1 Application topically.  Every shift.   oxycodone (OXY-IR) 5 MG capsule Take 5 mg by mouth every 6 (six) hours as needed for pain.   oxymetazoline (AFRIN) 0.05 % nasal [place] Place 1 [place] into both nostrils 2 (two) times daily.   pioglitazone (ACTOS) 45 MG tablet Take 45 mg by mouth daily.   sitaGLIPtin (JANUVIA) 50 MG tablet Take 50 mg by mouth daily.   sodium chloride 1 g tablet Give 500mg  by mouth twice daily   tamsulosin (FLOMAX) 0.4 MG CAPS capsule Take 0.4 mg by mouth at bedtime.   Vitamin D, Ergocalciferol, (DRISDOL) 1.25 MG (50000 UNIT) CAPS capsule Take 1 capsule (50,000 Units total) by mouth every 7 (seven) days.   ZINC OXIDE EX Apply topically. Apply to buttocks topically every shift for skin   [DISCONTINUED] folic acid (FOLVITE) 1 MG tablet Take 1 tablet (1 mg total) by mouth daily.   No facility-administered encounter medications on file as of 03/27/2023.    Review of Systems  Immunization History  Administered Date(s) Administered   Influenza Inj Mdck Quad Pf 05/07/2017, 04/12/2019   Influenza Inj Mdck Quad With Preservative 05/07/2017   Influenza, High Dose Seasonal PF 05/01/2022   Influenza-Unspecified 05/05/2012, 04/21/2013, 04/27/2014, 04/26/2015, 05/23/2016, 05/03/2018   Moderna Covid-19 Vaccine Bivalent Booster 19yrs & up 07/02/2021   Moderna Sars-Covid-2 Vaccination 08/24/2019, 09/21/2019, 07/10/2020   Novel Infuenza-h1n1-09 07/20/2008   Pneumococcal Conjugate-13 04/19/2019   Pneumococcal Polysaccharide-23 09/11/2000, 11/12/2007   Td (Adult),unspecified 05/22/2006   Tdap 04/19/2019   Zoster Recombinant(Shingrix) 05/03/2019, 07/04/2019   Zoster, Live 06/25/2012   Pertinent  Health Maintenance Due  Topic Date Due   FOOT EXAM  Never done   OPHTHALMOLOGY EXAM  Never done   INFLUENZA VACCINE  02/12/2023   HEMOGLOBIN A1C  09/08/2023      07/25/2019    2:09 PM 02/03/2020   11:55 AM 08/08/2020    9:52 AM 08/06/2021    9:59 AM 02/04/2022   10:47 AM  Fall Risk  (RETIRED) Patient Fall Risk  Level Low fall risk Low fall risk Low fall risk Low fall risk Low fall risk   Functional Status Survey:    Vitals:   03/27/23 1531 03/27/23 1558  BP: (!) 149/70 (!) 156/77  Pulse: 91   Resp: (!) 22   Temp: 98 F (36.7 C)   SpO2: 91%   Weight: 219 lb 9.6 oz (99.6 kg)   Height: 5' 7.5" (1.715 m)    Body mass index is 33.89 kg/m. Physical Exam Constitutional:      Appearance: Normal appearance.  Cardiovascular:     Rate and Rhythm: Normal rate.  Pulmonary:     Effort: Pulmonary effort is normal.  Abdominal:     General: Abdomen is flat.  Musculoskeletal:        General: Swelling present.  Skin:    General: Skin is warm and dry.  Neurological:     Mental Status: He is alert and oriented to person, place, and time.     Labs reviewed:  Recent Labs    03/16/23 0437 03/17/23 0314 03/18/23 0801 03/19/23 0416 03/20/23 0439 03/23/23 0000  NA 130* 128* 128* 129* 129* 129*  K 3.8 3.7 4.3 4.0 4.0 4.4  CL 97* 93* 96* 95* 97* 94*  CO2 27 27 24 25 23  24*  GLUCOSE 185* 254* 253* 242* 199*  --   BUN 16 16 16 11 12 11   CREATININE 0.68 0.74 0.67 0.66 0.63 0.7  CALCIUM 8.1* 8.1* 7.9* 7.7* 7.7* 8.5*  MG 1.7 1.9 1.8 1.6* 1.9  --   PHOS 2.8 3.1 2.5  --   --   --    Recent Labs    03/16/23 0437 03/17/23 0314 03/18/23 0801 03/23/23 0000  AST 28 48* 36 23  ALT 58* 76* 62* 48*  ALKPHOS 57 58 56 75  BILITOT 0.8 1.0 0.3  --   PROT 5.3* 5.9* 5.4*  --   ALBUMIN 2.2* 2.5* 2.3* 3.0*   Recent Labs    08/07/22 0930 02/05/23 1004 03/08/23 0620 03/09/23 0436 03/18/23 0801 03/19/23 0416 03/20/23 0439  WBC 27.4* 28.6* 35.1*   < > 56.6* 66.7* 69.6*  NEUTROABS 6.3 6.8 19.8*  --   --   --   --   HGB 13.6 13.8 12.1*   < > 10.4* 8.5* 8.1*  HCT 44.0 43.7 36.5*   < > 30.8* 26.4* 24.7*  MCV 93.6 93.2 90.1   < > 86.8 90.7 90.1  PLT 379 302 218   < > 367 461* 480*   < > = values in this interval not displayed.   Lab Results  Component Value Date   TSH 2.679 03/08/2023   Lab  Results  Component Value Date   HGBA1C 6.7 (H) 03/08/2023   No results found for: "CHOL", "HDL", "LDLCALC", "LDLDIRECT", "TRIG", "CHOLHDL"  Significant Diagnostic Results in last 30 days:  ECHOCARDIOGRAM COMPLETE  Result Date: 03/20/2023    ECHOCARDIOGRAM REPORT   Patient Name:   Philip Robbins Date of Exam: 03/20/2023 Medical Rec #:  [id number]      Height:       67.5 in Accession #:    [id number]     Weight:       230.6 lb Date of Birth:  11/19/1940     BSA:          2.160 m Patient Age:    81 years       BP:           167/53 mmHg Patient Gender: M              HR:           97 bpm. Exam Location:  [facility] Procedure: 2D Echo, Cardiac Doppler and Color Doppler Indications:     Dyspnea  History:         Patient has no prior history of Echocardiogram examinations.                  Signs/Symptoms:Dyspnea. Bilateral humeral fractures. Patient                  was not able to lie on his left side. Echo performed with                  patient sitting.  Sonographer:     Mikki Harbor Referring Phys:  [id number] Marcelino Duster Diagnosing Phys: Debbe Odea MD  Sonographer Comments: Technically difficult study due to poor echo windows. IMPRESSIONS  1. Left ventricular ejection  fraction, by estimation, is 60 to 65%. The left ventricle has normal function. The left ventricle has no regional wall motion abnormalities. Left ventricular diastolic parameters are consistent with Grade I diastolic dysfunction (impaired relaxation).  2. Right ventricular systolic function is normal. The right ventricular size is mildly enlarged. There is normal pulmonary artery systolic pressure.  3. The mitral valve is normal in structure. Mild mitral valve regurgitation.  4. The aortic valve is calcified. Aortic valve regurgitation is not visualized. Aortic valve sclerosis/calcification is present, without any evidence of aortic stenosis.  5. The inferior vena cava is normal in size with greater than 50% respiratory variability,  suggesting right atrial pressure of 3 mmHg. FINDINGS  Left Ventricle: Left ventricular ejection fraction, by estimation, is 60 to 65%. The left ventricle has normal function. The left ventricle has no regional wall motion abnormalities. The left ventricular internal cavity size was normal in size. There is  no left ventricular hypertrophy. Left ventricular diastolic parameters are consistent with Grade I diastolic dysfunction (impaired relaxation). Right Ventricle: The right ventricular size is mildly enlarged. No increase in right ventricular wall thickness. Right ventricular systolic function is normal. There is normal pulmonary artery systolic pressure. The tricuspid regurgitant velocity is 1.56  m/s, and with an assumed right atrial pressure of 3 mmHg, the estimated right ventricular systolic pressure is 12.7 mmHg. Left Atrium: Left atrial size was normal in size. Right Atrium: Right atrial size was normal in size. Pericardium: There is no evidence of pericardial effusion. Mitral Valve: The mitral valve is normal in structure. Mild mitral valve regurgitation. MV peak gradient, 7.8 mmHg. The mean mitral valve gradient is 3.0 mmHg. Tricuspid Valve: The tricuspid valve is normal in structure. Tricuspid valve regurgitation is mild. Aortic Valve: The aortic valve is calcified. Aortic valve regurgitation is not visualized. Aortic valve sclerosis/calcification is present, without any evidence of aortic stenosis. Aortic valve mean gradient measures 7.5 mmHg. Aortic valve peak gradient measures 14.1 mmHg. Aortic valve area, by VTI measures 2.88 cm. Pulmonic Valve: The pulmonic valve was not well visualized. Pulmonic valve regurgitation is not visualized. Aorta: The aortic root is normal in size and structure. Venous: The inferior vena cava is normal in size with greater than 50% respiratory variability, suggesting right atrial pressure of 3 mmHg. IAS/Shunts: No atrial level shunt detected by color flow Doppler.  LEFT  VENTRICLE PLAX 2D LVIDd:         5.20 cm   Diastology LVIDs:         2.60 cm   LV e' medial:    6.85 cm/s LV PW:         0.80 cm   LV E/e' medial:  12.0 LV IVS:        0.90 cm   LV e' lateral:   10.80 cm/s LVOT diam:     2.00 cm   LV E/e' lateral: 7.6 LV SV:         97 LV SV Index:   45 LVOT Area:     3.14 cm  RIGHT VENTRICLE RV Basal diam:  3.65 cm RV Mid diam:    3.90 cm RV S prime:     22.90 cm/s TAPSE (M-mode): 2.6 cm LEFT ATRIUM             Index        RIGHT ATRIUM           Index LA diam:        4.10 cm 1.90 cm/m  RA Area:     19.10 cm LA Vol (A2C):   61.7 ml 28.56 ml/m  RA Volume:   52.80 ml  24.44 ml/m LA Vol (A4C):   66.7 ml 30.88 ml/m LA Biplane Vol: 65.6 ml 30.37 ml/m  AORTIC VALVE AV Area (Vmax):    2.41 cm AV Area (Vmean):   2.44 cm AV Area (VTI):     2.88 cm AV Vmax:           188.00 cm/s AV Vmean:          125.000 cm/s AV VTI:            0.338 m AV Peak Grad:      14.1 mmHg AV Mean Grad:      7.5 mmHg LVOT Vmax:         144.00 cm/s LVOT Vmean:        97.200 cm/s LVOT VTI:          0.310 m LVOT/AV VTI ratio: 0.92  AORTA Ao Root diam: 3.70 cm MITRAL VALVE                TRICUSPID VALVE MV Area (PHT): 2.56 cm     TR Peak grad:   9.7 mmHg MV Area VTI:   3.80 cm     TR Vmax:        156.00 cm/s MV Peak grad:  7.8 mmHg MV Mean grad:  3.0 mmHg     SHUNTS MV Vmax:       1.40 m/s     Systemic VTI:  0.31 m MV Vmean:      70.3 cm/s    Systemic Diam: 2.00 cm MV Decel Time: 296 msec MV E velocity: 82.30 cm/s MV A velocity: 125.00 cm/s MV E/A ratio:  0.66 Debbe Odea MD Electronically signed by Debbe Odea MD Signature Date/Time: 03/20/2023/12:51:05 PM    Final    DG Chest Port 1 View  Result Date: 03/19/2023 CLINICAL DATA:  Hypoxia EXAM: PORTABLE CHEST 1 VIEW COMPARISON:  03/08/2023 FINDINGS: Bibasilar discoid atelectasis. No pneumothorax or pleural effusion. Cardiac size within normal limits. Pulmonary vascularity is normal. Left total shoulder arthroplasty has been performed. No acute  bone abnormality. IMPRESSION: 1. Bibasilar discoid atelectasis. Electronically Signed   By: Helyn Numbers M.D.   On: 03/19/2023 18:57   DG Shoulder 1V Right  Result Date: 03/17/2023 CLINICAL DATA:  Status post reverse total right shoulder arthroplasty today. EXAM: RIGHT SHOULDER - 1 VIEW COMPARISON:  CT right shoulder 03/13/2023 FINDINGS: Interval reverse total right shoulder arthroplasty. No perihardware lucency is seen to indicate hardware failure or loosening. Expected postoperative subcutaneous and subacromial/subdeltoid bursal air. Lateral surgical skin staples. No acute fracture or dislocation. IMPRESSION: Interval reverse total right shoulder arthroplasty without evidence of hardware failure or loosening. Electronically Signed   By: Neita Garnet M.D.   On: 03/17/2023 19:02   DG Shoulder 1V Left  Result Date: 03/17/2023 CLINICAL DATA:  Status post reverse total left arthroplasty performed last week. EXAM: LEFT SHOULDER COMPARISON:  Left shoulder radiographs 03/13/2023, CT left shoulder FINDINGS: Redemonstration of reverse total left shoulder arthroplasty. On frontal view there is normal alignment. No perihardware lucency to indicate hardware failure or loosening. Mild cortical step-off at the inferolateral left humeral head corresponding to residua of the acute fracture seen on prior 03/10/2023 CT. Mild acromioclavicular joint space narrowing and peripheral osteophytosis. Mild superior left shoulder subcutaneous air is again seen. There are superolateral left shoulder surgical skin staples. IMPRESSION: Status post  reverse total left shoulder arthroplasty without evidence of hardware failure or loosening. Electronically Signed   By: Neita Garnet M.D.   On: 03/17/2023 18:08   CT SHOULDER RIGHT WO CONTRAST  Result Date: 03/13/2023 CLINICAL DATA:  Right humerus fracture. 82 year old male who sustained a right 3 part minimally displaced proximal humerus fracture and left 4 part proximal humerus  fracture EXAM: CT OF THE UPPER RIGHT EXTREMITY WITHOUT CONTRAST TECHNIQUE: Multidetector CT imaging of the upper right extremity was performed according to the standard protocol. RADIATION DOSE REDUCTION: This exam was performed according to the departmental dose-optimization program which includes automated exposure control, adjustment of the mA and/or kV according to patient size and/or use of iterative reconstruction technique. COMPARISON:  Right shoulder radiographs 03/08/2023 and 03/13/2023 FINDINGS: Bones/Joint/Cartilage There is diffuse decreased bone mineralization. There is an acute, comminuted fracture of the right proximal humerus involving the humeral head, surgical neck, and greater and lesser tuberosities. There is posterior subluxation of the humeral head, which rests on the posterior aspect of the glenoid, where there is a triangular bone defect/depression within the medial humeral head (axial series 3 images 32 through 47) measuring up to approximately 2.4 cm in depth. There is up to approximately 4 mm cortical step-off at the lateral aspect of the surgical neck (coronal series 6 image 113). Up to 6 mm diastasis of the greater tuberosity fractures within the superolateral humeral head (coronal series 6, image 118). Up to approximately 1.4 cm impaction of the anterior aspect of the surgical neck into the inferior aspect of the humeral head (sagittal series 8, image 48). There is a tiny defect measuring up to 3 mm in depth at the posteroinferior aspect of the glenoid adjacent to the posterior subluxed humeral head that may represent a tiny corner fracture (axial series 2, image 86). Severe acromioclavicular joint space narrowing with subchondral sclerosis and mild-to-moderate peripheral osteophytosis. Ligaments Suboptimally assessed by CT. Muscles and Tendons Mild-to-moderate supraspinatus muscle atrophy. Heterogeneous likely blood products extending into the subscapularis muscle anterior to the  proximal humeral fractures. Soft tissues Mild fluid within the subacromial subdeltoid bursa. Scattered linear and ground-glass subsegmental atelectasis within the visualized aspect of the right upper lung. Trace pericardial fluid. IMPRESSION: 1. Acute, comminuted fracture of the right proximal humerus involving the humeral head, surgical neck, and greater and lesser tuberosities. There is posterior subluxation of the humeral head, which rests on the posterior aspect of the glenoid, where there is a triangular bone defect/depression within the medial humeral head junction with the lesser tuberosity, measuring up to approximately 2.4 cm in depth. 2. Up to approximately 1.4 cm craniocaudal impaction of the anterior aspect of the surgical neck into the inferior aspect of the humeral head. 3. There is a tiny defect measuring up to 3 mm in depth at the posteroinferior aspect of the glenoid adjacent to the posterior subluxed humeral head that may represent a tiny corner fracture. Electronically Signed   By: Neita Garnet M.D.   On: 03/13/2023 19:38   DG Shoulder Right Port  Result Date: 03/13/2023 CLINICAL DATA:  Proximal humeral fracture. EXAM: RIGHT SHOULDER - 1 VIEW COMPARISON:  03/08/2023 FINDINGS: Comminuted fractures of the right humeral head and neck. Mildly displaced greater trochanteric fragment. Overlap of the humeral head and glenoid suggests posterior dislocation at the glenohumeral joint. Acromioclavicular and coracoclavicular spaces are normal. Soft tissues are unremarkable. IMPRESSION: Posterior dislocation of the right shoulder with displaced comminuted fractures of the humeral head and neck. Electronically Signed   By:  Burman Nieves M.D.   On: 03/13/2023 18:43   DG Shoulder Left Port  Result Date: 03/13/2023 CLINICAL DATA:  Left shoulder fracture status post arthroplasty EXAM: LEFT SHOULDER COMPARISON:  03/08/2023 FINDINGS: Internal rotation, external rotation, transscapular views of the left  shoulder are obtained. Interval placement of a left total shoulder arthroplasty in the expected position without evidence of acute complication. Postsurgical changes in the overlying soft tissues. Left chest is clear. IMPRESSION: 1. Unremarkable left shoulder arthroplasty. Electronically Signed   By: Sharlet Salina M.D.   On: 03/13/2023 18:41   CT SHOULDER LEFT WO CONTRAST  Result Date: 03/10/2023 CLINICAL DATA:  Left shoulder pain.  Proximal humerus fracture. EXAM: CT OF THE UPPER LEFT EXTREMITY WITHOUT CONTRAST TECHNIQUE: Multidetector CT imaging of the upper left extremity was performed according to the standard protocol. RADIATION DOSE REDUCTION: This exam was performed according to the departmental dose-optimization program which includes automated exposure control, adjustment of the mA and/or kV according to patient size and/or use of iterative reconstruction technique. COMPARISON:  Left shoulder x-rays dated March 08, 2023. FINDINGS: Bones/Joint/Cartilage Acute comminuted fracture of the left proximal humerus involving the surgical neck, humeral head, and greater and lesser tuberosities. There is impaction at the neck measuring approximately 2.8 cm, with 1.4 cm anterior displacement. The humeral head is dislocated posteriorly with respect to the glenoid. The glenoid appears intact. Mild degenerative changes of the acromioclavicular joint. Large glenohumeral hemarthrosis. Ligaments Ligaments are suboptimally evaluated by CT. Muscles and Tendons Grossly intact.  No muscle atrophy. Soft tissue No fluid collection or hematoma. No soft tissue mass. Visualized left lung is clear. IMPRESSION: 1. Acute comminuted fracture-dislocation of the left proximal humerus as described above. 2. Large glenohumeral hemarthrosis. Electronically Signed   By: Obie Dredge M.D.   On: 03/10/2023 18:43   US RENAL  Result Date: 03/10/2023 CLINICAL DATA:  UTI. EXAM: RENAL / URINARY TRACT ULTRASOUND COMPLETE COMPARISON:  None  Available. FINDINGS: Right Kidney: Renal measurements: 11.5 cm x 6.3 cm x 6.0 cm = volume: 228.6 mL. Echogenicity within normal limits. No mass or hydronephrosis visualized. Left Kidney: Renal measurements: 12.9 cm x 6.8 cm x 5.3 cm = volume: 239.3 mL. Echogenicity within normal limits. No mass or hydronephrosis visualized. Bladder: Appears normal for degree of bladder distention. The bilateral ureteral jets are visualized. Other: None. IMPRESSION: Unremarkable renal ultrasound. Electronically Signed   By: Aram Candela M.D.   On: 03/10/2023 01:36   DG Shoulder Left  Result Date: 03/08/2023 CLINICAL DATA:  Left shoulder pain. EXAM: LEFT SHOULDER - 2+ VIEW COMPARISON:  None Available. FINDINGS: A comminuted fracture is seen involving the left humeral head and neck. The bilateral view shows posterior subluxation of the humeral head, with anterior displacement of the humeral neck. IMPRESSION: Comminuted fracture of left humeral head and neck. Posterior subluxation of the humeral head, with anterior displacement of the humeral neck. Electronically Signed   By: Danae Orleans M.D.   On: 03/08/2023 11:41   DG Shoulder Right  Result Date: 03/08/2023 CLINICAL DATA:  Right shoulder pain. EXAM: RIGHT SHOULDER - 2+ VIEW COMPARISON:  None Available. FINDINGS: A mildly displaced, comminuted fracture is seen involving the right humeral head and neck. No evidence of dislocation. IMPRESSION: Mildly displaced, comminuted fracture of the right humeral head and neck. Electronically Signed   By: Danae Orleans M.D.   On: 03/08/2023 11:39   CT Head Wo Contrast  Result Date: 03/08/2023 CLINICAL DATA:  Hypoxia and fever.  Mental status change. EXAM:  CT HEAD WITHOUT CONTRAST CT MAXILLOFACIAL WITHOUT CONTRAST TECHNIQUE: Multidetector CT imaging of the head and maxillofacial structures were performed using the standard protocol without intravenous contrast. Multiplanar CT image reconstructions of the maxillofacial structures were  also generated. RADIATION DOSE REDUCTION: This exam was performed according to the departmental dose-optimization program which includes automated exposure control, adjustment of the mA and/or kV according to patient size and/or use of iterative reconstruction technique. COMPARISON:  None Available. FINDINGS: CT HEAD FINDINGS Brain: No evidence of acute infarction, hemorrhage, hydrocephalus, extra-axial collection or mass lesion/mass effect. Generalized atrophy. Vascular: No hyperdense vessel or unexpected calcification. Skull: Normal. Negative for fracture or focal lesion. CT MAXILLOFACIAL FINDINGS Osseous: No fracture or mandibular dislocation. No destructive process. Orbits: Negative. No traumatic or inflammatory finding. Sinuses: Prior endoscopic sinus surgery.  No hemosinus. Soft tissues: No acute finding IMPRESSION: 1. No acute finding or specific cause for symptoms. 2. Generalized brain atrophy. Electronically Signed   By: Tiburcio Pea M.D.   On: 03/08/2023 08:34   CT Maxillofacial W Contrast  Result Date: 03/08/2023 CLINICAL DATA:  Hypoxia and fever.  Mental status change. EXAM: CT HEAD WITHOUT CONTRAST CT MAXILLOFACIAL WITHOUT CONTRAST TECHNIQUE: Multidetector CT imaging of the head and maxillofacial structures were performed using the standard protocol without intravenous contrast. Multiplanar CT image reconstructions of the maxillofacial structures were also generated. RADIATION DOSE REDUCTION: This exam was performed according to the departmental dose-optimization program which includes automated exposure control, adjustment of the mA and/or kV according to patient size and/or use of iterative reconstruction technique. COMPARISON:  None Available. FINDINGS: CT HEAD FINDINGS Brain: No evidence of acute infarction, hemorrhage, hydrocephalus, extra-axial collection or mass lesion/mass effect. Generalized atrophy. Vascular: No hyperdense vessel or unexpected calcification. Skull: Normal. Negative for  fracture or focal lesion. CT MAXILLOFACIAL FINDINGS Osseous: No fracture or mandibular dislocation. No destructive process. Orbits: Negative. No traumatic or inflammatory finding. Sinuses: Prior endoscopic sinus surgery.  No hemosinus. Soft tissues: No acute finding IMPRESSION: 1. No acute finding or specific cause for symptoms. 2. Generalized brain atrophy. Electronically Signed   By: Tiburcio Pea M.D.   On: 03/08/2023 08:34   DG Chest Port 1 View  Result Date: 03/08/2023 CLINICAL DATA:  82 year old male with history of shortness of breath. EXAM: PORTABLE CHEST 1 VIEW COMPARISON:  No priors. FINDINGS: Lung volumes are low. Diffuse interstitial prominence and peribronchial cuffing concerning for an acute bronchitis. Patchy ill-defined opacities also noted throughout the mid to lower lungs bilaterally, which may suggest developing multilobar bilateral bronchopneumonia. No definite pleural effusions. No pneumothorax. No evidence of pulmonary edema. Heart size is normal. Upper mediastinal contours are distorted by patient positioning. Atherosclerotic calcifications in the thoracic aorta. IMPRESSION: 1. The appearance the chest is concerning for bronchitis likely with developing multilobar bilateral bronchopneumonia, as above. 2. Aortic atherosclerosis. Electronically Signed   By: Trudie Reed M.D.   On: 03/08/2023 06:37   [place] FNA SALIVARY GLAND/PAROTID GLAND  Result Date: 03/06/2023 INDICATION: Left parotid mass EXAM: Ultrasound-guided fine-needle aspiration of left parotid mass MEDICATIONS: None. ANESTHESIA/SEDATION: Local analgesia COMPLICATIONS: None immediate. PROCEDURE: Informed written consent was obtained from the patient after a thorough discussion of the procedural risks, benefits and alternatives. All questions were addressed. Maximal Sterile Barrier Technique was utilized including caps, mask, sterile gowns, sterile gloves, sterile drape, hand hygiene and skin antiseptic. A timeout was  performed prior to the initiation of the procedure. The patient was placed supine on the exam table. Ultrasound of the left parotid gland demonstrated hypoechoic lesion  in the deep and superior aspect of the left parotid gland adjacent to the temporomandibular joint, compatible with recent CT imaging. Skin entry site was marked, and the overlying skin was prepped draped in the standard sterile fashion. Local analgesia was obtained with 1% lidocaine. Using ultrasound guidance, fine needle aspiration of the left parotid gland lesion was performed using 25 gauge needle x6 total passes. Specimens were submitted to the cytotechnologist to determine sample adequacy. Limited postprocedure imaging demonstrated expected post biopsy changes without large hematoma or other complicating feature. A clean dressing was placed after manual hemostasis. The patient tolerated the procedure well without immediate complication. IMPRESSION: Successful ultrasound-guided fine-needle aspiration of left parotid gland mass. Electronically Signed   By: Olive Bass M.D.   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  Fluid restriction of 1500 mL daily.  Sodium tablets 500 mg twice daily.  Unclear sodium contributed to swelling at this time.  Will start Lasix 40 x 3 days.  Start amlodipine 5 mg daily for hypertension.  Ultimately stool softeners in the setting of loose stools patient is low monitor daily weights.  Family/ staff Communication: nursing, spouse  Labs/tests ordered:  bmp, mg, bnp

## 2023-03-30 LAB — BASIC METABOLIC PANEL
BUN: 7 (ref 4–21)
CO2: 22 (ref 13–22)
Chloride: 90 — AB (ref 99–108)
Creatinine: 0.5 — AB (ref 0.6–1.3)
Glucose: 105
Potassium: 3.8 meq/L (ref 3.5–5.1)
Sodium: 122 — AB (ref 137–147)

## 2023-03-30 LAB — COMPREHENSIVE METABOLIC PANEL
Albumin: 3.1 — AB (ref 3.5–5.0)
Calcium: 7.9 — AB (ref 8.7–10.7)
Globulin: 2.2
eGFR: 103

## 2023-03-30 LAB — HEPATIC FUNCTION PANEL
ALT: 25 U/L (ref 10–40)
AST: 19 (ref 14–40)
Alkaline Phosphatase: 76 (ref 25–125)

## 2023-03-31 ENCOUNTER — Encounter: Payer: Self-pay | Admitting: Nurse Practitioner

## 2023-03-31 ENCOUNTER — Non-Acute Institutional Stay (SKILLED_NURSING_FACILITY): Payer: Medicare Other | Admitting: Nurse Practitioner

## 2023-03-31 DIAGNOSIS — I10 Essential (primary) hypertension: Secondary | ICD-10-CM | POA: Diagnosis not present

## 2023-03-31 DIAGNOSIS — E871 Hypo-osmolality and hyponatremia: Secondary | ICD-10-CM | POA: Diagnosis not present

## 2023-03-31 DIAGNOSIS — K589 Irritable bowel syndrome without diarrhea: Secondary | ICD-10-CM

## 2023-03-31 NOTE — Progress Notes (Unsigned)
Location:  [facility] Room Number: [facility] 108A Place of Service:  SNF (31) Abbey Chatters, NP  PCP: Marina Goodell, MD  Patient Care Team: Marina Goodell, MD as PCP - General (Family Medicine) Earna Coder, MD as Consulting Physician (Internal Medicine) Maryjane Hurter Madaline Guthrie, MD (Family Medicine)  Extended Emergency Contact Information Primary Emergency Contact: Bento,[name] Home Phone: [phone] Relation: Spouse Preferred language: English Interpreter needed? No Secondary Emergency Contact: Sana,Cara Mobile Phone: [phone] Relation: Daughter  Goals of care: Advanced Directive information    03/31/2023   12:55 PM  Advanced Directives  Does Patient Have a Medical Advance Directive? No  Does patient want to make changes to medical advance directive? No - Patient declined     Chief Complaint  Patient presents with   Acute Visit    Hyponatremia and LE Edema    HPI:  Pt is a 82 y.o. male seen today for an acute visit for Hyponatremia and LE Edema.  Pt is at [facility] due to bilateral shoulder fracture s/p repair.  Nursing reports patient was complaining of LE edema and requesting a visit Of note labs came back and sodium noted to be 122 down from 129.  Noted to have low sodium since hospitalization  He has been eating and drinking well. No diarrhea but having some frequent loose stools.  He has maintain his fluid restriction of 1.5L He is alert and oriented.  Today he reports LE edema has actually improved.  He is taking medications as prescribed.    Past Medical History:  Diagnosis Date   Arthritis    knee and back   BPH (benign prostatic hypertrophy)    Diabetes type 2, controlled (HCC)    DM type 2 (diabetes mellitus, type 2) (HCC)    Environmental allergies    Headache    sinus headaches   HLD (hyperlipidemia)    Hypothyroid    UTI (urinary tract infection)    Past Surgical History:  Procedure Laterality  Date   CARDIAC CATHETERIZATION     CARDIAC CATHETERIZATION     no stents placed   CHOLECYSTECTOMY     KNEE ARTHROSCOPY Right 03/16/2015   Procedure: ARTHROSCOPY KNEE WITH PARTIAL MEDIAL MENISECTOMY, AND CHONDROPLASTY;  Surgeon: Erin Sons, MD;  Location: [facility];  Service: Orthopedics;  Laterality: Right;  Diabetic - oral meds   REVERSE SHOULDER ARTHROPLASTY Left 03/13/2023   Procedure: REVERSE SHOULDER ARTHROPLASTY;  Surgeon: Signa Kell, MD;  Location: [facility] ORS;  Service: Orthopedics;  Laterality: Left;   REVERSE SHOULDER ARTHROPLASTY Right 03/17/2023   Procedure: REVERSE SHOULDER ARTHROPLASTY;  Surgeon: Signa Kell, MD;  Location: [facility] ORS;  Service: Orthopedics;  Laterality: Right;    No Known Allergies  Outpatient Encounter Medications as of 03/31/2023  Medication Sig   acetaminophen (TYLENOL) 500 MG tablet Take 1,000 mg by mouth every 8 (eight) hours as needed for mild pain.   amLODipine (NORVASC) 5 MG tablet Take 5 mg by mouth daily.   ascorbic acid (VITAMIN C) 500 MG tablet Take 1 tablet (500 mg total) by mouth daily.   atorvastatin (LIPITOR) 40 MG tablet Take 40 mg by mouth daily.   bisacodyl (DULCOLAX) 10 MG suppository Place 1 suppository (10 mg total) rectally daily as needed for moderate constipation.   cyanocobalamin 1000 MCG tablet Take 1 tablet (1,000 mcg total) by mouth daily.   docusate sodium (COLACE) 100 MG capsule Take 1 capsule (100 mg total) by mouth 2 (two) times daily.  fluticasone (FLONASE) 50 MCG/ACT nasal Pata Place 2 sprays into both nostrils every evening.   folic acid (FOLVITE) 1 MG tablet Take 1 mg by mouth daily.   furosemide (LASIX) 40 MG tablet Take 40 mg by mouth daily.   guaiFENesin (ROBITUSSIN) 100 MG/5ML liquid Take 10 mLs by mouth every 4 (four) hours as needed for cough or to loosen phlegm. Give 10ml by mouth once daily   iron polysaccharides (NIFEREX) 150 MG capsule Take 1 capsule (150 mg total) by mouth daily.   levothyroxine  (SYNTHROID) 75 MCG tablet Take 75 mcg by mouth daily before breakfast.   lisinopril (PRINIVIL,ZESTRIL) 10 MG tablet Take 10 mg by mouth daily.   melatonin 3 MG TABS tablet Take 3 mg by mouth at bedtime.   metFORMIN (GLUCOPHAGE) 1000 MG tablet Take 1 tablet by mouth 2 (two) times daily with a meal.   nystatin cream (MYCOSTATIN) Apply 1 Application topically. Every shift.   oxycodone (OXY-IR) 5 MG capsule Take 5 mg by mouth every 6 (six) hours as needed for pain.   OXYGEN 2 LPM every 8 hours as needed.   oxymetazoline (AFRIN) 0.05 % nasal Amaker Place 1 Spangler into both nostrils 2 (two) times daily.   pioglitazone (ACTOS) 45 MG tablet Take 45 mg by mouth daily.   sitaGLIPtin (JANUVIA) 50 MG tablet Take 50 mg by mouth daily.   sodium chloride (OCEAN) 0.65 % SOLN nasal Garson Place 2 sprays into both nostrils as needed for congestion.   sodium chloride 1 g tablet Give 500mg  by mouth twice daily   tamsulosin (FLOMAX) 0.4 MG CAPS capsule Take 0.4 mg by mouth at bedtime.   Vitamin D, Ergocalciferol, (DRISDOL) 1.25 MG (50000 UNIT) CAPS capsule Take 1 capsule (50,000 Units total) by mouth every 7 (seven) days.   ZINC OXIDE EX Apply topically. Apply to buttocks topically every shift for skin   [DISCONTINUED] fluconazole (DIFLUCAN) 150 MG tablet Take 150 mg by mouth daily. Every Tuesday and Friday   No facility-administered encounter medications on file as of 03/31/2023.    Review of Systems  Constitutional:  Negative for activity change, appetite change, fatigue and unexpected weight change.  HENT:  Negative for congestion and hearing loss.   Eyes: Negative.   Respiratory:  Negative for cough and shortness of breath.   Cardiovascular:  Positive for leg swelling. Negative for chest pain and palpitations.  Gastrointestinal:  Negative for abdominal pain, constipation and diarrhea.  Genitourinary:  Negative for difficulty urinating and dysuria.  Musculoskeletal:  Negative for arthralgias and myalgias.   Skin:  Negative for color change and wound.  Neurological:  Negative for dizziness and weakness.  Psychiatric/Behavioral:  Negative for agitation, behavioral problems and confusion.     Immunization History  Administered Date(s) Administered   Influenza Inj Mdck Quad Pf 05/07/2017, 04/12/2019   Influenza Inj Mdck Quad With Preservative 05/07/2017   Influenza, High Dose Seasonal PF 05/01/2022   Influenza-Unspecified 05/05/2012, 04/21/2013, 04/27/2014, 04/26/2015, 05/23/2016, 05/03/2018   Moderna Covid-19 Vaccine Bivalent Booster 69yrs & up 07/02/2021   Moderna Sars-Covid-2 Vaccination 08/24/2019, 09/21/2019, 07/10/2020   Novel Infuenza-h1n1-09 07/20/2008   Pneumococcal Conjugate-13 04/19/2019   Pneumococcal Polysaccharide-23 09/11/2000, 11/12/2007   Td (Adult),unspecified 05/22/2006   Tdap 04/19/2019   Zoster Recombinant(Shingrix) 05/03/2019, 07/04/2019   Zoster, Live 06/25/2012   Pertinent  Health Maintenance Due  Topic Date Due   FOOT EXAM  Never done   OPHTHALMOLOGY EXAM  Never done   INFLUENZA VACCINE  02/12/2023   HEMOGLOBIN A1C  09/08/2023      07/25/2019    2:09 PM 02/03/2020   11:55 AM 08/08/2020    9:52 AM 08/06/2021    9:59 AM 02/04/2022   10:47 AM  Fall Risk  (RETIRED) Patient Fall Risk Level Low fall risk Low fall risk Low fall risk Low fall risk Low fall risk   Functional Status Survey:    Vitals:   03/31/23 1238 03/31/23 1439  BP: (!) 158/61 (!) 153/71  Pulse: 83   Resp: (!) 22   Temp: (!) 97.4 F (36.3 C)   SpO2: 90%   Weight: 219 lb 9.6 oz (99.6 kg)   Height: 5' 7.5" (1.715 m)    Body mass index is 33.89 kg/m. Physical Exam Constitutional:      General: He is not in acute distress.    Appearance: He is well-developed. He is not diaphoretic.  HENT:     Head: Normocephalic and atraumatic.     Right Ear: External ear normal.     Left Ear: External ear normal.     Mouth/Throat:     Pharynx: No oropharyngeal exudate.  Eyes:      Conjunctiva/sclera: Conjunctivae normal.     Pupils: Pupils are equal, round, and reactive to light.  Cardiovascular:     Rate and Rhythm: Normal rate and regular rhythm.     Heart sounds: Normal heart sounds.  Pulmonary:     Effort: Pulmonary effort is normal.     Breath sounds: Normal breath sounds.  Abdominal:     General: Bowel sounds are normal.     Palpations: Abdomen is soft.  Musculoskeletal:        General: No tenderness.     Cervical back: Normal range of motion and neck supple.     Right lower leg: Edema (2+) present.     Left lower leg: Edema (2+) present.  Skin:    General: Skin is warm and dry.  Neurological:     Mental Status: He is alert and oriented to person, place, and time.     Labs reviewed: Recent Labs    03/16/23 0437 03/17/23 0314 03/18/23 0801 03/19/23 0416 03/20/23 0439 03/23/23 0000 03/30/23 0000  NA 130* 128* 128* 129* 129* 129* 122*  K 3.8 3.7 4.3 4.0 4.0 4.4 3.8  CL 97* 93* 96* 95* 97* 94* 90*  CO2 27 27 24 25 23  24* 22  GLUCOSE 185* 254* 253* 242* 199*  --   --   BUN 16 16 16 11 12 11 7   CREATININE 0.68 0.74 0.67 0.66 0.63 0.7 0.5*  CALCIUM 8.1* 8.1* 7.9* 7.7* 7.7* 8.5* 7.9*  MG 1.7 1.9 1.8 1.6* 1.9  --   --   PHOS 2.8 3.1 2.5  --   --   --   --    Recent Labs    03/16/23 0437 03/17/23 0314 03/18/23 0801 03/23/23 0000 03/30/23 0000  AST 28 48* 36 23 19  ALT 58* 76* 62* 48* 25  ALKPHOS 57 58 56 75 76  BILITOT 0.8 1.0 0.3  --   --   PROT 5.3* 5.9* 5.4*  --   --   ALBUMIN 2.2* 2.5* 2.3* 3.0* 3.1*   Recent Labs    08/07/22 0930 02/05/23 1004 03/08/23 0620 03/09/23 0436 03/18/23 0801 03/19/23 0416 03/20/23 0439  WBC 27.4* 28.6* 35.1*   < > 56.6* 66.7* 69.6*  NEUTROABS 6.3 6.8 19.8*  --   --   --   --   HGB  13.6 13.8 12.1*   < > 10.4* 8.5* 8.1*  HCT 44.0 43.7 36.5*   < > 30.8* 26.4* 24.7*  MCV 93.6 93.2 90.1   < > 86.8 90.7 90.1  PLT 379 302 218   < > 367 461* 480*   < > = values in this interval not displayed.   Lab  Results  Component Value Date   TSH 2.679 03/08/2023   Lab Results  Component Value Date   HGBA1C 6.7 (H) 03/08/2023   No results found for: "CHOL", "HDL", "LDLCALC", "LDLDIRECT", "TRIG", "CHOLHDL"  Significant Diagnostic Results in last 30 days:  ECHOCARDIOGRAM COMPLETE  Result Date: 03/20/2023    ECHOCARDIOGRAM REPORT   Patient Name:   VIVIANO FLADGER Date of Exam: 03/20/2023 Medical Rec #:  [id number]      Height:       67.5 in Accession #:    [id number]     Weight:       230.6 lb Date of Birth:  June 21, 1941     BSA:          2.160 m Patient Age:    81 years       BP:           167/53 mmHg Patient Gender: M              HR:           97 bpm. Exam Location:  [facility] Procedure: 2D Echo, Cardiac Doppler and Color Doppler Indications:     Dyspnea  History:         Patient has no prior history of Echocardiogram examinations.                  Signs/Symptoms:Dyspnea. Bilateral humeral fractures. Patient                  was not able to lie on his left side. Echo performed with                  patient sitting.  Sonographer:     Mikki Harbor Referring Phys:  [id number] Marcelino Duster Diagnosing Phys: Debbe Odea MD  Sonographer Comments: Technically difficult study due to poor echo windows. IMPRESSIONS  1. Left ventricular ejection fraction, by estimation, is 60 to 65%. The left ventricle has normal function. The left ventricle has no regional wall motion abnormalities. Left ventricular diastolic parameters are consistent with Grade I diastolic dysfunction (impaired relaxation).  2. Right ventricular systolic function is normal. The right ventricular size is mildly enlarged. There is normal pulmonary artery systolic pressure.  3. The mitral valve is normal in structure. Mild mitral valve regurgitation.  4. The aortic valve is calcified. Aortic valve regurgitation is not visualized. Aortic valve sclerosis/calcification is present, without any evidence of aortic stenosis.  5. The inferior vena cava is  normal in size with greater than 50% respiratory variability, suggesting right atrial pressure of 3 mmHg. FINDINGS  Left Ventricle: Left ventricular ejection fraction, by estimation, is 60 to 65%. The left ventricle has normal function. The left ventricle has no regional wall motion abnormalities. The left ventricular internal cavity size was normal in size. There is  no left ventricular hypertrophy. Left ventricular diastolic parameters are consistent with Grade I diastolic dysfunction (impaired relaxation). Right Ventricle: The right ventricular size is mildly enlarged. No increase in right ventricular wall thickness. Right ventricular systolic function is normal. There is normal pulmonary artery systolic pressure. The tricuspid regurgitant velocity is 1.56  m/s,  and with an assumed right atrial pressure of 3 mmHg, the estimated right ventricular systolic pressure is 12.7 mmHg. Left Atrium: Left atrial size was normal in size. Right Atrium: Right atrial size was normal in size. Pericardium: There is no evidence of pericardial effusion. Mitral Valve: The mitral valve is normal in structure. Mild mitral valve regurgitation. MV peak gradient, 7.8 mmHg. The mean mitral valve gradient is 3.0 mmHg. Tricuspid Valve: The tricuspid valve is normal in structure. Tricuspid valve regurgitation is mild. Aortic Valve: The aortic valve is calcified. Aortic valve regurgitation is not visualized. Aortic valve sclerosis/calcification is present, without any evidence of aortic stenosis. Aortic valve mean gradient measures 7.5 mmHg. Aortic valve peak gradient measures 14.1 mmHg. Aortic valve area, by VTI measures 2.88 cm. Pulmonic Valve: The pulmonic valve was not well visualized. Pulmonic valve regurgitation is not visualized. Aorta: The aortic root is normal in size and structure. Venous: The inferior vena cava is normal in size with greater than 50% respiratory variability, suggesting right atrial pressure of 3 mmHg. IAS/Shunts:  No atrial level shunt detected by color flow Doppler.  LEFT VENTRICLE PLAX 2D LVIDd:         5.20 cm   Diastology LVIDs:         2.60 cm   LV e' medial:    6.85 cm/s LV PW:         0.80 cm   LV E/e' medial:  12.0 LV IVS:        0.90 cm   LV e' lateral:   10.80 cm/s LVOT diam:     2.00 cm   LV E/e' lateral: 7.6 LV SV:         97 LV SV Index:   45 LVOT Area:     3.14 cm  RIGHT VENTRICLE RV Basal diam:  3.65 cm RV Mid diam:    3.90 cm RV S prime:     22.90 cm/s TAPSE (M-mode): 2.6 cm LEFT ATRIUM             Index        RIGHT ATRIUM           Index LA diam:        4.10 cm 1.90 cm/m   RA Area:     19.10 cm LA Vol (A2C):   61.7 ml 28.56 ml/m  RA Volume:   52.80 ml  24.44 ml/m LA Vol (A4C):   66.7 ml 30.88 ml/m LA Biplane Vol: 65.6 ml 30.37 ml/m  AORTIC VALVE AV Area (Vmax):    2.41 cm AV Area (Vmean):   2.44 cm AV Area (VTI):     2.88 cm AV Vmax:           188.00 cm/s AV Vmean:          125.000 cm/s AV VTI:            0.338 m AV Peak Grad:      14.1 mmHg AV Mean Grad:      7.5 mmHg LVOT Vmax:         144.00 cm/s LVOT Vmean:        97.200 cm/s LVOT VTI:          0.310 m LVOT/AV VTI ratio: 0.92  AORTA Ao Root diam: 3.70 cm MITRAL VALVE                TRICUSPID VALVE MV Area (PHT): 2.56 cm     TR Peak grad:   9.7  mmHg MV Area VTI:   3.80 cm     TR Vmax:        156.00 cm/s MV Peak grad:  7.8 mmHg MV Mean grad:  3.0 mmHg     SHUNTS MV Vmax:       1.40 m/s     Systemic VTI:  0.31 m MV Vmean:      70.3 cm/s    Systemic Diam: 2.00 cm MV Decel Time: 296 msec MV E velocity: 82.30 cm/s MV A velocity: 125.00 cm/s MV E/A ratio:  0.66 Debbe Odea MD Electronically signed by Debbe Odea MD Signature Date/Time: 03/20/2023/12:51:05 PM    Final    DG Chest Port 1 View  Result Date: 03/19/2023 CLINICAL DATA:  Hypoxia EXAM: PORTABLE CHEST 1 VIEW COMPARISON:  03/08/2023 FINDINGS: Bibasilar discoid atelectasis. No pneumothorax or pleural effusion. Cardiac size within normal limits. Pulmonary vascularity is normal.  Left total shoulder arthroplasty has been performed. No acute bone abnormality. IMPRESSION: 1. Bibasilar discoid atelectasis. Electronically Signed   By: Helyn Numbers M.D.   On: 03/19/2023 18:57   DG Shoulder 1V Right  Result Date: 03/17/2023 CLINICAL DATA:  Status post reverse total right shoulder arthroplasty today. EXAM: RIGHT SHOULDER - 1 VIEW COMPARISON:  CT right shoulder 03/13/2023 FINDINGS: Interval reverse total right shoulder arthroplasty. No perihardware lucency is seen to indicate hardware failure or loosening. Expected postoperative subcutaneous and subacromial/subdeltoid bursal air. Lateral surgical skin staples. No acute fracture or dislocation. IMPRESSION: Interval reverse total right shoulder arthroplasty without evidence of hardware failure or loosening. Electronically Signed   By: Neita Garnet M.D.   On: 03/17/2023 19:02   DG Shoulder 1V Left  Result Date: 03/17/2023 CLINICAL DATA:  Status post reverse total left arthroplasty performed last week. EXAM: LEFT SHOULDER COMPARISON:  Left shoulder radiographs 03/13/2023, CT left shoulder FINDINGS: Redemonstration of reverse total left shoulder arthroplasty. On frontal view there is normal alignment. No perihardware lucency to indicate hardware failure or loosening. Mild cortical step-off at the inferolateral left humeral head corresponding to residua of the acute fracture seen on prior 03/10/2023 CT. Mild acromioclavicular joint space narrowing and peripheral osteophytosis. Mild superior left shoulder subcutaneous air is again seen. There are superolateral left shoulder surgical skin staples. IMPRESSION: Status post reverse total left shoulder arthroplasty without evidence of hardware failure or loosening. Electronically Signed   By: Neita Garnet M.D.   On: 03/17/2023 18:08   CT SHOULDER RIGHT WO CONTRAST  Result Date: 03/13/2023 CLINICAL DATA:  Right humerus fracture. 82 year old male who sustained a right 3 part minimally displaced  proximal humerus fracture and left 4 part proximal humerus fracture EXAM: CT OF THE UPPER RIGHT EXTREMITY WITHOUT CONTRAST TECHNIQUE: Multidetector CT imaging of the upper right extremity was performed according to the standard protocol. RADIATION DOSE REDUCTION: This exam was performed according to the departmental dose-optimization program which includes automated exposure control, adjustment of the mA and/or kV according to patient size and/or use of iterative reconstruction technique. COMPARISON:  Right shoulder radiographs 03/08/2023 and 03/13/2023 FINDINGS: Bones/Joint/Cartilage There is diffuse decreased bone mineralization. There is an acute, comminuted fracture of the right proximal humerus involving the humeral head, surgical neck, and greater and lesser tuberosities. There is posterior subluxation of the humeral head, which rests on the posterior aspect of the glenoid, where there is a triangular bone defect/depression within the medial humeral head (axial series 3 images 32 through 47) measuring up to approximately 2.4 cm in depth. There is up to approximately 4 mm cortical  step-off at the lateral aspect of the surgical neck (coronal series 6 image 113). Up to 6 mm diastasis of the greater tuberosity fractures within the superolateral humeral head (coronal series 6, image 118). Up to approximately 1.4 cm impaction of the anterior aspect of the surgical neck into the inferior aspect of the humeral head (sagittal series 8, image 48). There is a tiny defect measuring up to 3 mm in depth at the posteroinferior aspect of the glenoid adjacent to the posterior subluxed humeral head that Stasha Naraine represent a tiny corner fracture (axial series 2, image 86). Severe acromioclavicular joint space narrowing with subchondral sclerosis and mild-to-moderate peripheral osteophytosis. Ligaments Suboptimally assessed by CT. Muscles and Tendons Mild-to-moderate supraspinatus muscle atrophy. Heterogeneous likely blood products  extending into the subscapularis muscle anterior to the proximal humeral fractures. Soft tissues Mild fluid within the subacromial subdeltoid bursa. Scattered linear and ground-glass subsegmental atelectasis within the visualized aspect of the right upper lung. Trace pericardial fluid. IMPRESSION: 1. Acute, comminuted fracture of the right proximal humerus involving the humeral head, surgical neck, and greater and lesser tuberosities. There is posterior subluxation of the humeral head, which rests on the posterior aspect of the glenoid, where there is a triangular bone defect/depression within the medial humeral head junction with the lesser tuberosity, measuring up to approximately 2.4 cm in depth. 2. Up to approximately 1.4 cm craniocaudal impaction of the anterior aspect of the surgical neck into the inferior aspect of the humeral head. 3. There is a tiny defect measuring up to 3 mm in depth at the posteroinferior aspect of the glenoid adjacent to the posterior subluxed humeral head that Rache Klimaszewski represent a tiny corner fracture. Electronically Signed   By: Neita Garnet M.D.   On: 03/13/2023 19:38   DG Shoulder Right Port  Result Date: 03/13/2023 CLINICAL DATA:  Proximal humeral fracture. EXAM: RIGHT SHOULDER - 1 VIEW COMPARISON:  03/08/2023 FINDINGS: Comminuted fractures of the right humeral head and neck. Mildly displaced greater trochanteric fragment. Overlap of the humeral head and glenoid suggests posterior dislocation at the glenohumeral joint. Acromioclavicular and coracoclavicular spaces are normal. Soft tissues are unremarkable. IMPRESSION: Posterior dislocation of the right shoulder with displaced comminuted fractures of the humeral head and neck. Electronically Signed   By: Burman Nieves M.D.   On: 03/13/2023 18:43   DG Shoulder Left Port  Result Date: 03/13/2023 CLINICAL DATA:  Left shoulder fracture status post arthroplasty EXAM: LEFT SHOULDER COMPARISON:  03/08/2023 FINDINGS: Internal  rotation, external rotation, transscapular views of the left shoulder are obtained. Interval placement of a left total shoulder arthroplasty in the expected position without evidence of acute complication. Postsurgical changes in the overlying soft tissues. Left chest is clear. IMPRESSION: 1. Unremarkable left shoulder arthroplasty. Electronically Signed   By: Sharlet Salina M.D.   On: 03/13/2023 18:41   CT SHOULDER LEFT WO CONTRAST  Result Date: 03/10/2023 CLINICAL DATA:  Left shoulder pain.  Proximal humerus fracture. EXAM: CT OF THE UPPER LEFT EXTREMITY WITHOUT CONTRAST TECHNIQUE: Multidetector CT imaging of the upper left extremity was performed according to the standard protocol. RADIATION DOSE REDUCTION: This exam was performed according to the departmental dose-optimization program which includes automated exposure control, adjustment of the mA and/or kV according to patient size and/or use of iterative reconstruction technique. COMPARISON:  Left shoulder x-rays dated March 08, 2023. FINDINGS: Bones/Joint/Cartilage Acute comminuted fracture of the left proximal humerus involving the surgical neck, humeral head, and greater and lesser tuberosities. There is impaction at the neck measuring  approximately 2.8 cm, with 1.4 cm anterior displacement. The humeral head is dislocated posteriorly with respect to the glenoid. The glenoid appears intact. Mild degenerative changes of the acromioclavicular joint. Large glenohumeral hemarthrosis. Ligaments Ligaments are suboptimally evaluated by CT. Muscles and Tendons Grossly intact.  No muscle atrophy. Soft tissue No fluid collection or hematoma. No soft tissue mass. Visualized left lung is clear. IMPRESSION: 1. Acute comminuted fracture-dislocation of the left proximal humerus as described above. 2. Large glenohumeral hemarthrosis. Electronically Signed   By: Obie Dredge M.D.   On: 03/10/2023 18:43   US RENAL  Result Date: 03/10/2023 CLINICAL DATA:  UTI.  EXAM: RENAL / URINARY TRACT ULTRASOUND COMPLETE COMPARISON:  None Available. FINDINGS: Right Kidney: Renal measurements: 11.5 cm x 6.3 cm x 6.0 cm = volume: 228.6 mL. Echogenicity within normal limits. No mass or hydronephrosis visualized. Left Kidney: Renal measurements: 12.9 cm x 6.8 cm x 5.3 cm = volume: 239.3 mL. Echogenicity within normal limits. No mass or hydronephrosis visualized. Bladder: Appears normal for degree of bladder distention. The bilateral ureteral jets are visualized. Other: None. IMPRESSION: Unremarkable renal ultrasound. Electronically Signed   By: Aram Candela M.D.   On: 03/10/2023 01:36   DG Shoulder Left  Result Date: 03/08/2023 CLINICAL DATA:  Left shoulder pain. EXAM: LEFT SHOULDER - 2+ VIEW COMPARISON:  None Available. FINDINGS: A comminuted fracture is seen involving the left humeral head and neck. The bilateral view shows posterior subluxation of the humeral head, with anterior displacement of the humeral neck. IMPRESSION: Comminuted fracture of left humeral head and neck. Posterior subluxation of the humeral head, with anterior displacement of the humeral neck. Electronically Signed   By: Danae Orleans M.D.   On: 03/08/2023 11:41   DG Shoulder Right  Result Date: 03/08/2023 CLINICAL DATA:  Right shoulder pain. EXAM: RIGHT SHOULDER - 2+ VIEW COMPARISON:  None Available. FINDINGS: A mildly displaced, comminuted fracture is seen involving the right humeral head and neck. No evidence of dislocation. IMPRESSION: Mildly displaced, comminuted fracture of the right humeral head and neck. Electronically Signed   By: Danae Orleans M.D.   On: 03/08/2023 11:39   CT Head Wo Contrast  Result Date: 03/08/2023 CLINICAL DATA:  Hypoxia and fever.  Mental status change. EXAM: CT HEAD WITHOUT CONTRAST CT MAXILLOFACIAL WITHOUT CONTRAST TECHNIQUE: Multidetector CT imaging of the head and maxillofacial structures were performed using the standard protocol without intravenous contrast.  Multiplanar CT image reconstructions of the maxillofacial structures were also generated. RADIATION DOSE REDUCTION: This exam was performed according to the departmental dose-optimization program which includes automated exposure control, adjustment of the mA and/or kV according to patient size and/or use of iterative reconstruction technique. COMPARISON:  None Available. FINDINGS: CT HEAD FINDINGS Brain: No evidence of acute infarction, hemorrhage, hydrocephalus, extra-axial collection or mass lesion/mass effect. Generalized atrophy. Vascular: No hyperdense vessel or unexpected calcification. Skull: Normal. Negative for fracture or focal lesion. CT MAXILLOFACIAL FINDINGS Osseous: No fracture or mandibular dislocation. No destructive process. Orbits: Negative. No traumatic or inflammatory finding. Sinuses: Prior endoscopic sinus surgery.  No hemosinus. Soft tissues: No acute finding IMPRESSION: 1. No acute finding or specific cause for symptoms. 2. Generalized brain atrophy. Electronically Signed   By: Tiburcio Pea M.D.   On: 03/08/2023 08:34   CT Maxillofacial W Contrast  Result Date: 03/08/2023 CLINICAL DATA:  Hypoxia and fever.  Mental status change. EXAM: CT HEAD WITHOUT CONTRAST CT MAXILLOFACIAL WITHOUT CONTRAST TECHNIQUE: Multidetector CT imaging of the head and maxillofacial structures were performed  using the standard protocol without intravenous contrast. Multiplanar CT image reconstructions of the maxillofacial structures were also generated. RADIATION DOSE REDUCTION: This exam was performed according to the departmental dose-optimization program which includes automated exposure control, adjustment of the mA and/or kV according to patient size and/or use of iterative reconstruction technique. COMPARISON:  None Available. FINDINGS: CT HEAD FINDINGS Brain: No evidence of acute infarction, hemorrhage, hydrocephalus, extra-axial collection or mass lesion/mass effect. Generalized atrophy. Vascular: No  hyperdense vessel or unexpected calcification. Skull: Normal. Negative for fracture or focal lesion. CT MAXILLOFACIAL FINDINGS Osseous: No fracture or mandibular dislocation. No destructive process. Orbits: Negative. No traumatic or inflammatory finding. Sinuses: Prior endoscopic sinus surgery.  No hemosinus. Soft tissues: No acute finding IMPRESSION: 1. No acute finding or specific cause for symptoms. 2. Generalized brain atrophy. Electronically Signed   By: Tiburcio Pea M.D.   On: 03/08/2023 08:34   DG Chest Port 1 View  Result Date: 03/08/2023 CLINICAL DATA:  82 year old male with history of shortness of breath. EXAM: PORTABLE CHEST 1 VIEW COMPARISON:  No priors. FINDINGS: Lung volumes are low. Diffuse interstitial prominence and peribronchial cuffing concerning for an acute bronchitis. Patchy ill-defined opacities also noted throughout the mid to lower lungs bilaterally, which Vandy Fong suggest developing multilobar bilateral bronchopneumonia. No definite pleural effusions. No pneumothorax. No evidence of pulmonary edema. Heart size is normal. Upper mediastinal contours are distorted by patient positioning. Atherosclerotic calcifications in the thoracic aorta. IMPRESSION: 1. The appearance the chest is concerning for bronchitis likely with developing multilobar bilateral bronchopneumonia, as above. 2. Aortic atherosclerosis. Electronically Signed   By: Trudie Reed M.D.   On: 03/08/2023 06:37   [place] FNA SALIVARY GLAND/PAROTID GLAND  Result Date: 03/06/2023 INDICATION: Left parotid mass EXAM: Ultrasound-guided fine-needle aspiration of left parotid mass MEDICATIONS: None. ANESTHESIA/SEDATION: Local analgesia COMPLICATIONS: None immediate. PROCEDURE: Informed written consent was obtained from the patient after a thorough discussion of the procedural risks, benefits and alternatives. All questions were addressed. Maximal Sterile Barrier Technique was utilized including caps, mask, sterile gowns, sterile  gloves, sterile drape, hand hygiene and skin antiseptic. A timeout was performed prior to the initiation of the procedure. The patient was placed supine on the exam table. Ultrasound of the left parotid gland demonstrated hypoechoic lesion in the deep and superior aspect of the left parotid gland adjacent to the temporomandibular joint, compatible with recent CT imaging. Skin entry site was marked, and the overlying skin was prepped draped in the standard sterile fashion. Local analgesia was obtained with 1% lidocaine. Using ultrasound guidance, fine needle aspiration of the left parotid gland lesion was performed using 25 gauge needle x6 total passes. Specimens were submitted to the cytotechnologist to determine sample adequacy. Limited postprocedure imaging demonstrated expected post biopsy changes without large hematoma or other complicating feature. A clean dressing was placed after manual hemostasis. The patient tolerated the procedure well without immediate complication. IMPRESSION: Successful ultrasound-guided fine-needle aspiration of left parotid gland mass. Electronically Signed   By: Olive Bass M.D.   On: 03/06/2023 15:19    Assessment/Plan 1. Hyponatremia -follow up STAT BMP at this time.  -increase sodium tablets to 1g BID, first dose now -urine osmal and sodium   2. Irritable bowel syndrome without diarrhea Stable, having more loose stools at this time. Will dc colace.   3. Hypertension, unspecified type -blood pressure remains elevated Will stop lisinopril  and add carvedilol 6.25 mg po BID  Continues on norvasc 5 mg daily- will not increase due to LE edema -  contine q 8 hour BP checks  4. LE edema -ongoing, add compression hose     Jessica K. Biagio [facility] [phone]

## 2023-04-02 ENCOUNTER — Inpatient Hospital Stay
Admission: EM | Admit: 2023-04-02 | Discharge: 2023-04-07 | DRG: 641 | Disposition: A | Discharge status: Skilled Nursing Facility | Payer: Medicare Other | Source: Skilled Nursing Facility | Attending: Internal Medicine | Admitting: Internal Medicine

## 2023-04-02 ENCOUNTER — Other Ambulatory Visit: Payer: Self-pay

## 2023-04-02 DIAGNOSIS — R5381 Other malaise: Secondary | ICD-10-CM | POA: Diagnosis present

## 2023-04-02 DIAGNOSIS — Z803 Family history of malignant neoplasm of breast: Secondary | ICD-10-CM

## 2023-04-02 DIAGNOSIS — E871 Hypo-osmolality and hyponatremia: Secondary | ICD-10-CM | POA: Diagnosis present

## 2023-04-02 DIAGNOSIS — E785 Hyperlipidemia, unspecified: Secondary | ICD-10-CM | POA: Diagnosis present

## 2023-04-02 DIAGNOSIS — D630 Anemia in neoplastic disease: Secondary | ICD-10-CM | POA: Diagnosis present

## 2023-04-02 DIAGNOSIS — Z9049 Acquired absence of other specified parts of digestive tract: Secondary | ICD-10-CM

## 2023-04-02 DIAGNOSIS — I5032 Chronic diastolic (congestive) heart failure: Secondary | ICD-10-CM | POA: Diagnosis present

## 2023-04-02 DIAGNOSIS — E039 Hypothyroidism, unspecified: Secondary | ICD-10-CM | POA: Diagnosis present

## 2023-04-02 DIAGNOSIS — D75839 Thrombocytosis, unspecified: Secondary | ICD-10-CM | POA: Diagnosis present

## 2023-04-02 DIAGNOSIS — I10 Essential (primary) hypertension: Secondary | ICD-10-CM | POA: Diagnosis not present

## 2023-04-02 DIAGNOSIS — E669 Obesity, unspecified: Secondary | ICD-10-CM | POA: Diagnosis present

## 2023-04-02 DIAGNOSIS — I110 Hypertensive heart disease with heart failure: Secondary | ICD-10-CM | POA: Diagnosis present

## 2023-04-02 DIAGNOSIS — Z841 Family history of disorders of kidney and ureter: Secondary | ICD-10-CM

## 2023-04-02 DIAGNOSIS — Z6833 Body mass index (BMI) 33.0-33.9, adult: Secondary | ICD-10-CM | POA: Diagnosis not present

## 2023-04-02 DIAGNOSIS — Z7984 Long term (current) use of oral hypoglycemic drugs: Secondary | ICD-10-CM

## 2023-04-02 DIAGNOSIS — Z833 Family history of diabetes mellitus: Secondary | ICD-10-CM

## 2023-04-02 DIAGNOSIS — Z79890 Hormone replacement therapy (postmenopausal): Secondary | ICD-10-CM

## 2023-04-02 DIAGNOSIS — Z96612 Presence of left artificial shoulder joint: Secondary | ICD-10-CM | POA: Diagnosis present

## 2023-04-02 DIAGNOSIS — N400 Enlarged prostate without lower urinary tract symptoms: Secondary | ICD-10-CM | POA: Diagnosis present

## 2023-04-02 DIAGNOSIS — Z96611 Presence of right artificial shoulder joint: Secondary | ICD-10-CM | POA: Diagnosis present

## 2023-04-02 DIAGNOSIS — C9110 Chronic lymphocytic leukemia of B-cell type not having achieved remission: Secondary | ICD-10-CM | POA: Diagnosis present

## 2023-04-02 DIAGNOSIS — E119 Type 2 diabetes mellitus without complications: Secondary | ICD-10-CM | POA: Diagnosis present

## 2023-04-02 DIAGNOSIS — Z79899 Other long term (current) drug therapy: Secondary | ICD-10-CM

## 2023-04-02 DIAGNOSIS — R197 Diarrhea, unspecified: Secondary | ICD-10-CM | POA: Diagnosis present

## 2023-04-02 DIAGNOSIS — D509 Iron deficiency anemia, unspecified: Secondary | ICD-10-CM | POA: Diagnosis present

## 2023-04-02 LAB — BASIC METABOLIC PANEL
Anion gap: 10 (ref 5–15)
BUN: 6 (ref 4–21)
BUN: 7 mg/dL — ABNORMAL LOW (ref 8–23)
CO2: 20 mmol/L — ABNORMAL LOW (ref 22–32)
CO2: 23 — AB (ref 13–22)
Calcium: 8.1 mg/dL — ABNORMAL LOW (ref 8.9–10.3)
Chloride: 85 — AB (ref 99–108)
Chloride: 86 mmol/L — ABNORMAL LOW (ref 98–111)
Creatinine, Ser: 0.57 mg/dL — ABNORMAL LOW (ref 0.61–1.24)
Creatinine: 0.5 — AB (ref 0.6–1.3)
GFR, Estimated: >60 mL/min (ref >60)
Glucose, Bld: 125 mg/dL — ABNORMAL HIGH (ref 70–99)
Glucose: 110
Potassium: 3.8 mmol/L (ref 3.5–5.1)
Potassium: 4.2 mEq/L (ref 3.5–5.1)
Sodium: 116 mmol/L — CL (ref 135–145)
Sodium: 116 — AB (ref 137–147)

## 2023-04-02 LAB — CBC WITH DIFFERENTIAL/PLATELET
Abs Immature Granulocytes: 0.2 10*3/uL — ABNORMAL HIGH (ref 0.00–0.07)
Basophils Absolute: 0.2 10*3/uL — ABNORMAL HIGH (ref 0.0–0.1)
Basophils Relative: 0 %
Eosinophils Absolute: 0.4 10*3/uL (ref 0.0–0.5)
Eosinophils Relative: 1 %
HCT: 32.3 % — ABNORMAL LOW (ref 39.0–52.0)
Hemoglobin: 10.2 g/dL — ABNORMAL LOW (ref 13.0–17.0)
Immature Granulocytes: 1 %
Lymphocytes Relative: 72 %
Lymphs Abs: 27.3 10*3/uL — ABNORMAL HIGH (ref 0.7–4.0)
MCH: 29 pg (ref 26.0–34.0)
MCHC: 31.6 g/dL (ref 30.0–36.0)
MCV: 91.8 fL (ref 80.0–100.0)
Monocytes Absolute: 1.1 10*3/uL — ABNORMAL HIGH (ref 0.1–1.0)
Monocytes Relative: 3 %
Neutro Abs: 8.7 10*3/uL — ABNORMAL HIGH (ref 1.7–7.7)
Neutrophils Relative %: 23 %
Platelets: 488 10*3/uL — ABNORMAL HIGH (ref 150–400)
RBC: 3.52 MIL/uL — ABNORMAL LOW (ref 4.22–5.81)
RDW: 16.5 % — ABNORMAL HIGH (ref 11.5–15.5)
Smear Review: NORMAL
WBC: 37.9 10*3/uL — ABNORMAL HIGH (ref 4.0–10.5)
nRBC: 0 % (ref 0.0–0.2)

## 2023-04-02 LAB — COMPREHENSIVE METABOLIC PANEL
ALT: 22 U/L (ref 0–44)
AST: 29 U/L (ref 15–41)
Albumin: 3.1 g/dL — ABNORMAL LOW (ref 3.5–5.0)
Alkaline Phosphatase: 82 U/L (ref 38–126)
Anion gap: 11 (ref 5–15)
BUN: 7 mg/dL — ABNORMAL LOW (ref 8–23)
CO2: 18 mmol/L — ABNORMAL LOW (ref 22–32)
Calcium: 8.1 mg/dL — ABNORMAL LOW (ref 8.9–10.3)
Calcium: 8.3 — AB (ref 8.7–10.7)
Chloride: 85 mmol/L — ABNORMAL LOW (ref 98–111)
Creatinine, Ser: 0.55 mg/dL — ABNORMAL LOW (ref 0.61–1.24)
GFR, Estimated: >60 mL/min (ref >60)
Glucose, Bld: 132 mg/dL — ABNORMAL HIGH (ref 70–99)
Potassium: 4 mmol/L (ref 3.5–5.1)
Sodium: 114 mmol/L — CL (ref 135–145)
Total Bilirubin: 0.9 mg/dL (ref 0.3–1.2)
Total Protein: 6.3 g/dL — ABNORMAL LOW (ref 6.5–8.1)
eGFR: 102

## 2023-04-02 LAB — URINALYSIS, ROUTINE W REFLEX MICROSCOPIC
Bilirubin Urine: NEGATIVE
Glucose, UA: NEGATIVE mg/dL
Hgb urine dipstick: NEGATIVE
Ketones, ur: 5 mg/dL — AB
Leukocytes,Ua: NEGATIVE
Nitrite: NEGATIVE
Protein, ur: NEGATIVE mg/dL
Specific Gravity, Urine: 1.013 (ref 1.005–1.030)
pH: 6 (ref 5.0–8.0)

## 2023-04-02 LAB — OSMOLALITY, URINE: Osmolality, Ur: 314 mOsm/kg (ref 300–900)

## 2023-04-02 LAB — PHOSPHORUS: Phosphorus: 2.6 mg/dL (ref 2.5–4.6)

## 2023-04-02 LAB — SODIUM, URINE, RANDOM: Sodium, Ur: 29 mmol/L

## 2023-04-02 LAB — CBG MONITORING, ED: Glucose-Capillary: 122 mg/dL — ABNORMAL HIGH (ref 70–99)

## 2023-04-02 LAB — MAGNESIUM: Magnesium: 1.5 mg/dL — ABNORMAL LOW (ref 1.7–2.4)

## 2023-04-02 MED ORDER — MELATONIN 5 MG PO TABS
2.5000 mg | ORAL_TABLET | Freq: Every day | ORAL | Status: DC
  Administered 2023-04-02 – 2023-04-06 (×5): 2.5 mg via ORAL
  Filled 2023-04-02 (×5): qty 1

## 2023-04-02 MED ORDER — ALBUTEROL SULFATE (2.5 MG/3ML) 0.083% IN NEBU: 2.5000 mg | INHALATION_SOLUTION | RESPIRATORY_TRACT | Status: DC | PRN

## 2023-04-02 MED ORDER — TAMSULOSIN HCL 0.4 MG PO CAPS
0.4000 mg | ORAL_CAPSULE | Freq: Every day | ORAL | Status: DC
  Administered 2023-04-02 – 2023-04-06 (×5): 0.4 mg via ORAL
  Filled 2023-04-02 (×5): qty 1

## 2023-04-02 MED ORDER — SODIUM CHLORIDE 0.9 % IV BOLUS
1000.0000 mL | Freq: Once | INTRAVENOUS | Status: AC
  Administered 2023-04-02: 1000 mL via INTRAVENOUS

## 2023-04-02 MED ORDER — ACETAMINOPHEN 325 MG PO TABS
650.0000 mg | ORAL_TABLET | Freq: Four times a day (QID) | ORAL | Status: DC | PRN
  Administered 2023-04-05: 650 mg via ORAL
  Filled 2023-04-02 (×2): qty 2

## 2023-04-02 MED ORDER — HYDRALAZINE HCL 20 MG/ML IJ SOLN: 5.0000 mg | INTRAMUSCULAR | Status: DC | PRN

## 2023-04-02 MED ORDER — FOLIC ACID 1 MG PO TABS
1.0000 mg | ORAL_TABLET | Freq: Every day | ORAL | Status: DC
  Administered 2023-04-03 – 2023-04-07 (×5): 1 mg via ORAL
  Filled 2023-04-02 (×5): qty 1

## 2023-04-02 MED ORDER — AMLODIPINE BESYLATE 5 MG PO TABS
5.0000 mg | ORAL_TABLET | Freq: Every day | ORAL | Status: DC
  Administered 2023-04-03 – 2023-04-07 (×5): 5 mg via ORAL
  Filled 2023-04-02 (×5): qty 1

## 2023-04-02 MED ORDER — ENOXAPARIN SODIUM 60 MG/0.6ML IJ SOSY
50.0000 mg | PREFILLED_SYRINGE | INTRAMUSCULAR | Status: DC
  Administered 2023-04-02 – 2023-04-06 (×5): 50 mg via SUBCUTANEOUS
  Filled 2023-04-02 (×5): qty 0.6

## 2023-04-02 MED ORDER — INSULIN ASPART 100 UNIT/ML IJ SOLN: 0.0000 [IU] | Freq: Every day | INTRAMUSCULAR | Status: DC

## 2023-04-02 MED ORDER — ATORVASTATIN CALCIUM 20 MG PO TABS
40.0000 mg | ORAL_TABLET | Freq: Every day | ORAL | Status: DC
  Administered 2023-04-03 – 2023-04-07 (×4): 40 mg via ORAL
  Filled 2023-04-02 (×5): qty 2

## 2023-04-02 MED ORDER — POLYSACCHARIDE IRON COMPLEX 150 MG PO CAPS
150.0000 mg | ORAL_CAPSULE | Freq: Every day | ORAL | Status: DC
  Administered 2023-04-03 – 2023-04-07 (×5): 150 mg via ORAL
  Filled 2023-04-02 (×5): qty 1

## 2023-04-02 MED ORDER — VITAMIN B-12 1000 MCG PO TABS
1000.0000 ug | ORAL_TABLET | Freq: Every day | ORAL | Status: DC
  Administered 2023-04-03 – 2023-04-07 (×5): 1000 ug via ORAL
  Filled 2023-04-02: qty 2
  Filled 2023-04-02 (×4): qty 1

## 2023-04-02 MED ORDER — INSULIN ASPART 100 UNIT/ML IJ SOLN
0.0000 [IU] | Freq: Three times a day (TID) | INTRAMUSCULAR | Status: DC
  Administered 2023-04-03 (×2): 2 [IU] via SUBCUTANEOUS
  Administered 2023-04-04: 5 [IU] via SUBCUTANEOUS
  Administered 2023-04-04: 2 [IU] via SUBCUTANEOUS
  Administered 2023-04-04: 1 [IU] via SUBCUTANEOUS
  Administered 2023-04-05: 3 [IU] via SUBCUTANEOUS
  Administered 2023-04-05: 1 [IU] via SUBCUTANEOUS
  Administered 2023-04-05: 2 [IU] via SUBCUTANEOUS
  Administered 2023-04-06: 3 [IU] via SUBCUTANEOUS
  Administered 2023-04-06: 1 [IU] via SUBCUTANEOUS
  Administered 2023-04-06: 2 [IU] via SUBCUTANEOUS
  Administered 2023-04-07: 3 [IU] via SUBCUTANEOUS
  Administered 2023-04-07: 1 [IU] via SUBCUTANEOUS
  Filled 2023-04-02 (×13): qty 1

## 2023-04-02 MED ORDER — OXYCODONE HCL 5 MG PO TABS
5.0000 mg | ORAL_TABLET | Freq: Four times a day (QID) | ORAL | Status: DC | PRN
  Administered 2023-04-03 – 2023-04-06 (×5): 5 mg via ORAL
  Filled 2023-04-02 (×7): qty 1

## 2023-04-02 MED ORDER — LEVOTHYROXINE SODIUM 50 MCG PO TABS
75.0000 ug | ORAL_TABLET | Freq: Every day | ORAL | Status: DC
  Administered 2023-04-03 – 2023-04-07 (×5): 75 ug via ORAL
  Filled 2023-04-02 (×3): qty 1
  Filled 2023-04-02: qty 2
  Filled 2023-04-02: qty 1

## 2023-04-02 MED ORDER — SODIUM CHLORIDE 1 G PO TABS
1.0000 g | ORAL_TABLET | Freq: Two times a day (BID) | ORAL | Status: DC
  Administered 2023-04-02 – 2023-04-03 (×2): 1 g via ORAL
  Filled 2023-04-02 (×2): qty 1

## 2023-04-02 MED ORDER — SALINE SPRAY 0.65 % NA SOLN: 2.0000 | NASAL | Status: DC | PRN

## 2023-04-02 MED ORDER — ONDANSETRON HCL 4 MG/2ML IJ SOLN: 4.0000 mg | Freq: Three times a day (TID) | INTRAMUSCULAR | Status: DC | PRN

## 2023-04-02 NOTE — ED Notes (Signed)
IV team at bedside to assist with PIV insertion

## 2023-04-02 NOTE — ED Provider Notes (Addendum)
[facility] Provider Note    Event Date/Time   First MD Initiated Contact with Patient 04/02/23 1853     (approximate)   History   No chief complaint on file.   HPI  Philip Robbins is a 82 y.o. 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  He feels fine.  He had outpatient lab testing that showed profound hyponatremia.  On review of systems, he does state that he has had some profuse watery diarrhea in the setting of recent hospitalization antibiotic use.  He denies any abdominal pain.  No vomiting.  He denies chest pain, shortness of breath, fevers or chills.  He has no neurologic symptoms, denies altered mental status, confusion, seizures and family is at bedside and agree.  Independent Historian contributed to assessment above: Family members including his wife and daughter at bedside to corroborate information past medical history as above  External Medical Documents Reviewed: Discharge summary from 03/20/2023 for pneumonia noted to be hyponatremic at that time likely due to dehydration/hypovolemia with a low of 118 Na      Physical Exam   Triage Vital Signs: ED Triage Vitals  Encounter Vitals Group     BP 04/02/23 1706 (!) 161/88     Systolic BP Percentile --      Diastolic BP Percentile --      Pulse Rate 04/02/23 1706 66     Resp 04/02/23 1706 20     Temp 04/02/23 1706 98.4 F (36.9 C)     Temp Source 04/02/23 1706 Oral     SpO2 04/02/23 1706 95 %     Weight 04/02/23 1705 219 lb 9.6 oz (99.6 kg)     Height --      Head Circumference --      Peak Flow --      Pain Score 04/02/23 1705 0     Pain Loc --      Pain Education --      Exclude from Growth Chart --     Most recent vital signs: Vitals:   04/02/23 1706   BP: (!) 161/88  Pulse: 66  Resp: 20  Temp: 98.4 F (36.9 C)  SpO2: 95%    General: Awake, no distress.  CV:  Good peripheral perfusion.  Resp:  Normal effort.  Abd:  No distention.  Other:  Awake alert oriented pleasant gentleman in no acute distress.  Hypertensive otherwise vital signs normal.  Soft nontender abdomen and clear lungs.  Skin appears warm well-perfused.   ED Results / Procedures / Treatments   Labs (all labs ordered are listed, but only abnormal results are displayed) Labs Reviewed  CBC WITH DIFFERENTIAL/PLATELET - Abnormal; Notable for the following components:      Result Value   WBC 37.9 (*)    RBC 3.52 (*)    Hemoglobin 10.2 (*)    HCT 32.3 (*)    RDW 16.5 (*)    Platelets 488 (*)    Neutro Abs 8.7 (*)    Lymphs Abs 27.3 (*)    Monocytes Absolute 1.1 (*)    Basophils Absolute 0.2 (*)    Abs Immature Granulocytes 0.20 (*)    All other components within normal limits  COMPREHENSIVE METABOLIC PANEL - Abnormal; Notable for the following components:   Sodium 114 (*)  Chloride 85 (*)    CO2 18 (*)    Glucose, Bld 132 (*)    BUN 7 (*)    Creatinine, Ser 0.55 (*)    Calcium 8.1 (*)    Total Protein 6.3 (*)    Albumin 3.1 (*)    All other components within normal limits  GASTROINTESTINAL PANEL BY PCR, STOOL (REPLACES STOOL CULTURE)  C DIFFICILE QUICK SCREEN W PCR REFLEX    PATHOLOGIST SMEAR REVIEW  URINALYSIS, ROUTINE W REFLEX MICROSCOPIC  SODIUM, URINE, RANDOM  OSMOLALITY, URINE  MAGNESIUM  PHOSPHORUS  OSMOLALITY     I ordered and reviewed the above labs they are notable for hyponatremia with a sodium of 114, leukocytosis 38 compared to 60s and 50s upon last hospitalization.  EKG  ED ECG REPORT I, Pilar Jarvis, the attending physician, personally viewed and interpreted this ECG.   Date: 04/02/2023  EKG Time: 1712  Rate: 63  Rhythm: sinus  Axis: nl  Intervals:none  ST&T Change: no stemi    PROCEDURES:  Critical Care performed:  Yes, see critical care procedure note(s)  .Critical Care  Performed by: Pilar Jarvis, MD Authorized by: Pilar Jarvis, MD   Critical care provider statement:    Critical care time (minutes):  30   Critical care was time spent personally by me on the following activities:  Development of treatment plan with patient or surrogate, discussions with consultants, evaluation of patient's response to treatment, examination of patient, ordering and review of laboratory studies, ordering and review of radiographic studies, ordering and performing treatments and interventions, pulse oximetry, re-evaluation of patient's condition and review of old charts    MEDICATIONS ORDERED IN ED: Medications  sodium chloride 0.9 % bolus 1,000 mL (has no administration in time range)    External physician / consultants:  I spoke with hospitalist for admission and regarding care plan for this patient.   IMPRESSION / MDM / ASSESSMENT AND PLAN / ED COURSE  I reviewed the triage vital signs and the nursing notes.                                Patient's presentation is most consistent with acute presentation with potential threat to life or bodily function.  Differential diagnosis includes, but is not limited to, hyponatremia, diarrhea, C. difficile   The patient is on the cardiac monitor to evaluate for evidence of arrhythmia and/or significant heart rate changes.  MDM:    Patient with a history of hyponatremia with profound hyponatremia but fortunately asymptomatic no neurologic symptoms in the setting of GI losses and diarrhea.  Will give hydration with 1 L of normal saline, send urine electrolytes, slow correction of hyponatremia to avoid overcorrection, check stool studies and C. difficile.  Admission.  Spoke with Dr. Wynelle Link of nephrology, who is now aware of the patient presentation and plan       FINAL CLINICAL IMPRESSION(S) / ED DIAGNOSES   Final diagnoses:  Hyponatremia  Diarrhea,  unspecified type     Rx / DC Orders   ED Discharge Orders     None        Note:  This document was prepared using Dragon voice recognition software and may include unintentional dictation errors.    Pilar Jarvis, MD 04/02/23 1941    Pilar Jarvis, MD 04/02/23 [phone]

## 2023-04-02 NOTE — ED Notes (Signed)
Na 114 informed to [facility] MD

## 2023-04-02 NOTE — ED Triage Notes (Signed)
ARrives from [facility] via [facility]>  ARrives for abnormal Sodium of 116.  Patient with history of low sodium.  Recently fractured both shoulders, so upper body movement is restricted.  Patient without complaint. VS wnl.

## 2023-04-02 NOTE — Progress Notes (Signed)
PHARMACIST - PHYSICIAN COMMUNICATION  CONCERNING:  Enoxaparin (Lovenox) for DVT Prophylaxis   RECOMMENDATION: Patient was prescribed enoxaprin 40mg  q24 hours for VTE prophylaxis.   Filed Weights   04/02/23 1705  Weight: 99.6 kg (219 lb 9.6 oz)    Body mass index is 33.89 kg/m.  Estimated Creatinine Clearance: 83.5 mL/min (A) (by C-G formula based on SCr of 0.55 mg/dL (L)).   Based on [facility] policy patient is candidate for enoxaparin 0.5mg /kg TBW SQ every 24 hours based on BMI being >30.  DESCRIPTION: Pharmacy has adjusted enoxaparin dose per [facility] policy.  Patient is now receiving enoxaparin 50 mg every 24 hours   Effie Shy, PharmD PGY1 Pharmacy Resident 04/02/2023 8:03 PM

## 2023-04-02 NOTE — H&P (Signed)
History and Physical    Philip Robbins YQI:[id number] DOB: [birth year] DOA: 04/02/2023  Referring MD/NP/PA:   PCP: Marina Goodell, MD   Patient coming from:  The patient is coming from SNF   Chief Complaint: abnormal lab with hyponatremia, diarrhea.  HPI: Philip Robbins is a 82 y.o. male with medical history significant of HTN, HLD, DM, dCHF, hypothyroidism, BPH, anemia, CLL, chronic hyponatremia, obesity, recent bilateral shoulder fracture with dislocation (s/p of surgery for both shoulder), who presents with abnormal lab with hyponatremia and diarrhea  Patient was recently hospitalized due to bilateral shoulder fracture with dislocation.  Patient is s/p of bilateral shoulder surgery on 03/17/23, and currently doing rehab.  Surgical sites have been feeling well with mild tenderness.  Patient has intermittent diarrhea in the past 3 days, with 1-2 times of watery diarrhea each day.  No nausea, vomiting or abdominal pain.  No fever or chills.  Patient has mild dry cough, no chest pain or shortness of breath.  No symptoms of UTI.  He had labs done in facility which showed hyponatremia with sodium of 116, and sent to hospital for further evaluation and treatment. Pt is found to have sodium 114 in ED. patient has chronic hyponatremia, recent sodium level  was 122 on 03/30/23.  No confusion or seizure activity.  Data reviewed independently and ED Course: pt was found to have WBC 37.9, GFR> 60, temperature normal, blood pressure 161/88, heart rate 66, RR 20, oxygen saturation 95% on room air.  Patient is admitted to PCU as inpatient.  Dr. Wynelle Link of renal is consulted.   EKG: I have personally reviewed.  Sinus rhythm, QTc 464, LAD, poor R wave progression   Review of Systems:   General: no fevers, chills, no body weight gain, fatigue HEENT: no blurry vision, hearing changes or sore throat Respiratory: no dyspnea, has coughing, no wheezing CV: no chest pain, no palpitations GI: no nausea,  vomiting, abdominal pain, has diarrhea, no constipation GU: no dysuria, burning on urination, increased urinary frequency, hematuria  Ext: has trace leg edema Neuro: no unilateral weakness, numbness, or tingling, no vision change or hearing loss Skin: no rash, no skin tear. MSK: No muscle spasm, no deformity, no limitation of range of movement in spin.  Heme: No easy bruising.  Travel history: No recent long distant travel.   Allergy: No Known Allergies  Past Medical History:  Diagnosis Date   Arthritis    knee and back   BPH (benign prostatic hypertrophy)    Diabetes type 2, controlled (HCC)    DM type 2 (diabetes mellitus, type 2) (HCC)    Environmental allergies    Headache    sinus headaches   HLD (hyperlipidemia)    Hypothyroid    UTI (urinary tract infection)     Past Surgical History:  Procedure Laterality Date   CARDIAC CATHETERIZATION     CARDIAC CATHETERIZATION     no stents placed   CHOLECYSTECTOMY     KNEE ARTHROSCOPY Right 03/16/2015   Procedure: ARTHROSCOPY KNEE WITH PARTIAL MEDIAL MENISECTOMY, AND CHONDROPLASTY;  Surgeon: Erin Sons, MD;  Location: [facility];  Service: Orthopedics;  Laterality: Right;  Diabetic - oral meds   REVERSE SHOULDER ARTHROPLASTY Left 03/13/2023   Procedure: REVERSE SHOULDER ARTHROPLASTY;  Surgeon: Signa Kell, MD;  Location: [facility];  Service: Orthopedics;  Laterality: Left;   REVERSE SHOULDER ARTHROPLASTY Right 03/17/2023   Procedure: REVERSE SHOULDER ARTHROPLASTY;  Surgeon: Signa Kell, MD;  Location: [facility];  Service:  Orthopedics;  Laterality: Right;    Social History:  reports that he has never smoked. He has never used smokeless tobacco. He reports that he does not drink alcohol and does not use drugs.  Family History:  Family History  Problem Relation Age of Onset   Renal Disease Father        ESRD   Diabetes Father    Breast cancer Sister      Prior to Admission medications   Medication Sig Start Date  End Date Taking? Authorizing Provider  acetaminophen (TYLENOL) 500 MG tablet Take 1,000 mg by mouth every 8 (eight) hours as needed for mild pain.    [provider]  amLODipine (NORVASC) 5 MG tablet Take 5 mg by mouth daily.    [provider]  ascorbic acid (VITAMIN C) 500 MG tablet Take 1 tablet (500 mg total) by mouth daily. 03/20/23   Marcelino Duster, MD  atorvastatin (LIPITOR) 40 MG tablet Take 40 mg by mouth daily.    [provider]  bisacodyl (DULCOLAX) 10 MG suppository Place 1 suppository (10 mg total) rectally daily as needed for moderate constipation. 03/20/23   Marcelino Duster, MD  cyanocobalamin 1000 MCG tablet Take 1 tablet (1,000 mcg total) by mouth daily. 03/20/23   Marcelino Duster, MD  docusate sodium (COLACE) 100 MG capsule Take 1 capsule (100 mg total) by mouth 2 (two) times daily. 03/20/23   Marcelino Duster, MD  fluticasone (FLONASE) 50 MCG/ACT nasal [address] Place 2 sprays into both nostrils every evening.    [provider]  folic acid (FOLVITE) 1 MG tablet Take 1 mg by mouth daily.    [provider]  furosemide (LASIX) 40 MG tablet Take 40 mg by mouth daily.    [provider]  guaiFENesin (ROBITUSSIN) 100 MG/5ML liquid Take 10 mLs by mouth every 4 (four) hours as needed for cough or to loosen phlegm. Give 10ml by mouth once daily    [provider]  iron polysaccharides (NIFEREX) 150 MG capsule Take 1 capsule (150 mg total) by mouth daily. 03/20/23   Marcelino Duster, MD  levothyroxine (SYNTHROID) 75 MCG tablet Take 75 mcg by mouth daily before breakfast.    [provider]  lisinopril (PRINIVIL,ZESTRIL) 10 MG tablet Take 10 mg by mouth daily.    [provider]  melatonin 3 MG TABS tablet Take 3 mg by mouth at bedtime.    [provider]  metFORMIN (GLUCOPHAGE) 1000 MG tablet Take 1 tablet by mouth 2 (two) times daily with a meal. 12/24/20   [provider]   nystatin cream (MYCOSTATIN) Apply 1 Application topically. Every shift.    [provider]  oxycodone (OXY-IR) 5 MG capsule Take 5 mg by mouth every 6 (six) hours as needed for pain.    [provider]  OXYGEN 2 LPM every 8 hours as needed.    [provider]  oxymetazoline (AFRIN) 0.05 % nasal [address] 1 [place] into both nostrils 2 (two) times daily. 03/20/23   Marcelino Duster, MD  pioglitazone (ACTOS) 45 MG tablet Take 45 mg by mouth daily.    [provider]  sitaGLIPtin (JANUVIA) 50 MG tablet Take 50 mg by mouth daily.    [provider]  sodium chloride (OCEAN) 0.65 % SOLN nasal [address] 2 sprays into both nostrils as needed for congestion.    [provider]  sodium chloride 1 g tablet Give 500mg  by mouth twice daily    [provider]  tamsulosin (FLOMAX) 0.4 MG CAPS capsule Take 0.4 mg by mouth at bedtime. 07/27/20   [provider]  Vitamin D, Ergocalciferol, (DRISDOL) 1.25 MG (50000 UNIT) CAPS capsule Take 1 capsule (50,000 Units total) by mouth every 7 (seven) days. 03/20/23   Marcelino Duster, MD  ZINC OXIDE EX Apply topically. Apply to buttocks topically every shift for skin    [provider]    Physical Exam: Vitals:   04/02/23 2000 04/02/23 2100 04/02/23 2200 04/03/23 0000  BP: (!) 157/53 (!) 165/63 (!) 178/73 126/64  Pulse: 69 78 84 71  Resp: (!) 22 (!) 21 (!) 22 18  Temp:      TempSrc:      SpO2: 96% 96% 92% 98%  Weight:       General: Not in acute distress HEENT:       Eyes: PERRL, EOMI, no jaundice       ENT: No discharge from the ears and nose, no pharynx injection, no tonsillar enlargement.        Neck: No JVD, no bruit, no mass felt. Heme: No neck lymph node enlargement. Cardiac: S1/S2, RRR, No murmurs, No gallops or rubs. Respiratory: No rales, wheezing, rhonchi or rubs. GI: Soft, nondistended, nontender, no rebound pain, no organomegaly, BS present. GU: No  hematuria Ext: has trace leg edema bilaterally. 1+DP/PT pulse bilaterally. Musculoskeletal: No joint deformities, No joint redness or warmth, no limitation of ROM in spin. S/p of bilateral shoulder surgery, surgical sites are healing well and clean Skin: No rashes.  Neuro: Alert, oriented X3, cranial nerves II-XII grossly intact, moves all extremities normally Psych: Patient is not psychotic, no suicidal or hemocidal ideation.  Labs on Admission: I have personally reviewed following labs and imaging studies  CBC: Recent Labs  Lab 04/02/23 1707  WBC 37.9*  NEUTROABS 8.7*  HGB 10.2*  HCT 32.3*  MCV 91.8  PLT 488*   Basic Metabolic Panel: Recent Labs  Lab 03/30/23 0000 04/02/23 1707 04/02/23 2049  NA 122* 114* 116*  K 3.8 4.0 3.8  CL 90* 85* 86*  CO2 22 18* 20*  GLUCOSE  --  132* 125*  BUN 7 7* 7*  CREATININE 0.5* 0.55* 0.57*  CALCIUM 7.9* 8.1* 8.1*  MG  --   --  1.5*  PHOS  --   --  2.6   GFR: Estimated Creatinine Clearance: 83.5 mL/min (A) (by C-G formula based on SCr of 0.57 mg/dL (L)). Liver Function Tests: Recent Labs  Lab 03/30/23 0000 04/02/23 1707  AST 19 29  ALT 25 22  ALKPHOS 76 82  BILITOT  --  0.9  PROT  --  6.3*  ALBUMIN 3.1* 3.1*   No results for input(s): "LIPASE", "AMYLASE" in the last 168 hours. No results for input(s): "AMMONIA" in the last 168 hours. Coagulation Profile: No results for input(s): "INR", "PROTIME" in the last 168 hours. Cardiac Enzymes: No results for input(s): "CKTOTAL", "CKMB", "CKMBINDEX", "TROPONINI" in the last 168 hours. BNP (last 3 results) No results for input(s): "PROBNP" in the last 8760 hours. HbA1C: No results for input(s): "HGBA1C" in the last 72 hours. CBG: Recent Labs  Lab 04/02/23 2102  GLUCAP 122*   Lipid Profile: No results for input(s): "CHOL", "HDL", "LDLCALC", "TRIG", "CHOLHDL", "LDLDIRECT" in the last 72 hours. Thyroid Function Tests: No results for input(s): "TSH", "T4TOTAL", "FREET4",  "T3FREE", "THYROIDAB" in the last 72 hours. Anemia Panel: No results for input(s): "VITAMINB12", "FOLATE", "FERRITIN", "TIBC", "IRON", "RETICCTPCT" in the last 72 hours.  Urine analysis:    Component Value Date/Time   COLORURINE YELLOW (A) 04/02/2023 2050   APPEARANCEUR CLEAR (A) 04/02/2023 2050   LABSPEC 1.013 04/02/2023 2050   PHURINE 6.0 04/02/2023 2050   GLUCOSEU NEGATIVE 04/02/2023 2050   HGBUR NEGATIVE 04/02/2023 2050   BILIRUBINUR NEGATIVE 04/02/2023 2050   KETONESUR 5 (A) 04/02/2023 2050   PROTEINUR NEGATIVE 04/02/2023 2050   NITRITE NEGATIVE 04/02/2023 2050   LEUKOCYTESUR NEGATIVE 04/02/2023 2050   Sepsis Labs: @LABRCNTIP (procalcitonin:4,lacticidven:4) )No results found for this or any previous visit (from the past 240 hour(s)).   Radiological Exams on Admission: No results found.    Assessment/Plan Principal Problem:   Hyponatremia Active Problems:   Diarrhea   Type 2 diabetes mellitus without complication, without long-term current use of insulin (HCC)   Hyperlipidemia   Hypertension   Chronic diastolic CHF (congestive heart failure) (HCC)   BPH (benign prostatic hyperplasia)   Iron deficiency anemia   CLL (chronic lymphocytic leukemia) (HCC)   Hypothyroidism   Obesity (BMI 30-39.9)   Assessment and Plan:  Hyponatremia: Na 114.  Mental status normal.  Likely due to diarrhea  -Will admit to PCU as inpt - Will check urine sodium, urine osmolality, serum osmolality. - Fluid restriction - IVF: 1L NS in ED, will continue with IV normal saline at 100 mL/h - Sodium chloride tablet 1 g twice daily - f/u by BMP q6h - avoid over correction too fast due to risk of central pontine myelinolysis  Diarrhea: -IV fluid as above -Follow-up with C. difficile and GI pathogen panel  Type 2 diabetes mellitus without complication, without long-term current use of insulin (HCC): Recent A1c 6.7, well-controlled.  Patient taking metformin, Januvia and Actos -Sliding scale  insulin  Hyperlipidemia -Lipitor  Hypertension -IV hydralazine as needed -Amlodipine -Hold Lasix and lisinopril due to hyponatremia  Chronic diastolic CHF (congestive heart failure) (HCC): 2D echo on 03/20/2023 showed EF of 60 to 65% with grade 1 diastolic dysfunction.  Patient has trace leg edema, no shortness of breath, CHF seem to be compensated. -Check BNP -Hold Lasix due to hyponatremia  BPH (benign prostatic hyperplasia) -Flomax  Iron deficiency anemia: Hemoglobin stable at 10.2 -Continue iron supplement  CLL (chronic lymphocytic leukemia) (HCC): WBC 37.9 (69.6).  Patient's is not getting chemotherapy. -Following up with Dr. Donneta Romberg per patient  Hypothyroidism -Synthroid  Obesity (BMI 30-39.9): Body weight 99.6 kg, BMI 33.89 -Encourage losing weight -Exercise/healthy diet      DVT ppx: SQ Lovenox  Code Status: Full code    Family Communication:   Yes, patient's  wife and daughter  at bed side.    Disposition Plan:  Anticipate discharge back to previous environment, SNF  Consults called:  Dr. Wynelle Link of renal is consulted.  Admission status and Level of care: Progressive:  as inpt     Dispo: The patient is from: SNF              Anticipated d/c is to: SNF              Anticipated d/c date is: 2 days              Patient currently is not medically stable to d/c.    Severity of Illness:  The appropriate patient status for this patient is INPATIENT. Inpatient status is judged to be reasonable and necessary in order to provide the required intensity of service to ensure the patient's safety. The patient's presenting symptoms, physical exam findings, and initial radiographic and laboratory data  in the context of their chronic comorbidities is felt to place them at high risk for further clinical deterioration. Furthermore, it is not anticipated that the patient will be medically stable for discharge from the hospital within 2 midnights of admission.   * I  certify that at the point of admission it is my clinical judgment that the patient will require inpatient hospital care spanning beyond 2 midnights from the point of admission due to high intensity of service, high risk for further deterioration and high frequency of surveillance required.*       Date of Service 04/03/2023    Lorretta Harp [facility]   If 7PM-7AM, please contact night-coverage [email or web address] 04/03/2023, 12:50 AM

## 2023-04-03 DIAGNOSIS — E871 Hypo-osmolality and hyponatremia: Secondary | ICD-10-CM | POA: Diagnosis not present

## 2023-04-03 DIAGNOSIS — E039 Hypothyroidism, unspecified: Secondary | ICD-10-CM | POA: Diagnosis present

## 2023-04-03 LAB — BASIC METABOLIC PANEL
Anion gap: 8 (ref 5–15)
Anion gap: 11 (ref 5–15)
Anion gap: 9 (ref 5–15)
Anion gap: 12 (ref 5–15)
BUN: 6 mg/dL — ABNORMAL LOW (ref 8–23)
BUN: <5 mg/dL — ABNORMAL LOW (ref 8–23)
BUN: 5 mg/dL — ABNORMAL LOW (ref 8–23)
BUN: 7 mg/dL — ABNORMAL LOW (ref 8–23)
CO2: 21 mmol/L — ABNORMAL LOW (ref 22–32)
CO2: 22 mmol/L (ref 22–32)
CO2: 22 mmol/L (ref 22–32)
CO2: 22 mmol/L (ref 22–32)
Calcium: 7.7 mg/dL — ABNORMAL LOW (ref 8.9–10.3)
Calcium: 8.2 mg/dL — ABNORMAL LOW (ref 8.9–10.3)
Calcium: 8.1 mg/dL — ABNORMAL LOW (ref 8.9–10.3)
Calcium: 8.4 mg/dL — ABNORMAL LOW (ref 8.9–10.3)
Chloride: 91 mmol/L — ABNORMAL LOW (ref 98–111)
Chloride: 90 mmol/L — ABNORMAL LOW (ref 98–111)
Chloride: 92 mmol/L — ABNORMAL LOW (ref 98–111)
Chloride: 91 mmol/L — ABNORMAL LOW (ref 98–111)
Creatinine, Ser: 0.53 mg/dL — ABNORMAL LOW (ref 0.61–1.24)
Creatinine, Ser: 0.52 mg/dL — ABNORMAL LOW (ref 0.61–1.24)
Creatinine, Ser: 0.64 mg/dL (ref 0.61–1.24)
Creatinine, Ser: 0.74 mg/dL (ref 0.61–1.24)
GFR, Estimated: >60 mL/min (ref >60)
GFR, Estimated: >60 mL/min (ref >60)
GFR, Estimated: >60 mL/min (ref >60)
GFR, Estimated: >60 mL/min (ref >60)
Glucose, Bld: 115 mg/dL — ABNORMAL HIGH (ref 70–99)
Glucose, Bld: 141 mg/dL — ABNORMAL HIGH (ref 70–99)
Glucose, Bld: 163 mg/dL — ABNORMAL HIGH (ref 70–99)
Glucose, Bld: 147 mg/dL — ABNORMAL HIGH (ref 70–99)
Potassium: 3.5 mmol/L (ref 3.5–5.1)
Potassium: 3.4 mmol/L — ABNORMAL LOW (ref 3.5–5.1)
Potassium: 3.8 mmol/L (ref 3.5–5.1)
Potassium: 3.8 mmol/L (ref 3.5–5.1)
Sodium: 120 mmol/L — ABNORMAL LOW (ref 135–145)
Sodium: 121 mmol/L — ABNORMAL LOW (ref 135–145)
Sodium: 123 mmol/L — ABNORMAL LOW (ref 135–145)
Sodium: 125 mmol/L — ABNORMAL LOW (ref 135–145)

## 2023-04-03 LAB — CBG MONITORING, ED
Glucose-Capillary: 97 mg/dL (ref 70–99)
Glucose-Capillary: 147 mg/dL — ABNORMAL HIGH (ref 70–99)
Glucose-Capillary: 160 mg/dL — ABNORMAL HIGH (ref 70–99)

## 2023-04-03 LAB — PATHOLOGIST SMEAR REVIEW

## 2023-04-03 LAB — CBC
HCT: 28.2 % — ABNORMAL LOW (ref 39.0–52.0)
Hemoglobin: 9.3 g/dL — ABNORMAL LOW (ref 13.0–17.0)
MCH: 28.8 pg (ref 26.0–34.0)
MCHC: 33 g/dL (ref 30.0–36.0)
MCV: 87.3 fL (ref 80.0–100.0)
Platelets: 463 10*3/uL — ABNORMAL HIGH (ref 150–400)
RBC: 3.23 MIL/uL — ABNORMAL LOW (ref 4.22–5.81)
RDW: 16.4 % — ABNORMAL HIGH (ref 11.5–15.5)
WBC: 34 10*3/uL — ABNORMAL HIGH (ref 4.0–10.5)
nRBC: 0 % (ref 0.0–0.2)

## 2023-04-03 LAB — GLUCOSE, CAPILLARY
Glucose-Capillary: 163 mg/dL — ABNORMAL HIGH (ref 70–99)
Glucose-Capillary: 119 mg/dL — ABNORMAL HIGH (ref 70–99)

## 2023-04-03 LAB — BRAIN NATRIURETIC PEPTIDE: B Natriuretic Peptide: 115.3 pg/mL — ABNORMAL HIGH (ref 0.0–100.0)

## 2023-04-03 LAB — OSMOLALITY: Osmolality: 244 mOsm/kg — CL (ref 275–295)

## 2023-04-03 MED ORDER — SODIUM CHLORIDE 0.9 % IV SOLN
INTRAVENOUS | Status: DC
  Administered 2023-04-03: 1000 mL via INTRAVENOUS

## 2023-04-03 MED ORDER — SODIUM CHLORIDE 1 G PO TABS
1.0000 g | ORAL_TABLET | Freq: Two times a day (BID) | ORAL | Status: DC
  Administered 2023-04-03 – 2023-04-06 (×6): 1 g via ORAL
  Filled 2023-04-03 (×6): qty 1

## 2023-04-03 NOTE — Progress Notes (Signed)
[facility]  ROUNDING NOTE   Subjective:   Philip Robbins is a 82 y.o. male with past medical history of CLL, BPH, hypertension, diabetes mellitus type II, hypothyroidism, and hyperlipidemia. Patient presents to ED with abnormal labs and has been admitted for Hyponatremia [E87.1]  Patient is known to our practice from previous admission. Patient is seen laying on stretcher with wife and daughter at bedside. Family states he was functioning well at home, having his best days recently. Denies any symptoms of decreased sodium. Resident of [facility], states he has a poor appetite. Numerous episodes of diarrhea.   Labs on ED arrival significant for sodium 114, s bicarb 18, and Hgb 10.2. S osm 244 and urine osm 314. Receiving normal saline at 100 ml/hr.  We have been consulted to manage hyponatremia.   Objective:  Vital signs in last 24 hours:  Temp:  [97.8 F (36.6 C)-98.4 F (36.9 C)] 98.3 F (36.8 C) (09/20 1101) Pulse Rate:  [64-84] 76 (09/20 1100) Resp:  [16-28] 20 (09/20 1100) BP: (126-178)/(51-88) 152/58 (09/20 1100) SpO2:  [92 %-99 %] 96 % (09/20 1000) Weight:  [99.6 kg] 99.6 kg (09/19 1705)  Weight change:  Filed Weights   04/02/23 1705  Weight: 99.6 kg    Intake/Output: I/O last 3 completed shifts: In: 1000 [IV Piggyback:1000] Out: -    Intake/Output this shift:  No intake/output data recorded.  Physical Exam: General: NAD  Head: Normocephalic, atraumatic. Moist oral mucosal membranes  Eyes: Anicteric  Neck: Supple  Lungs:  Clear to auscultation  Heart: Regular rate and rhythm  Abdomen:  Soft, nontender,   Extremities:  1+ peripheral edema.  Neurologic: Nonfocal, moving all four extremities  Skin: No lesions  Access: None    Basic Metabolic Panel: Recent Labs  Lab 03/30/23 0000 04/02/23 1707 04/02/23 2049 04/03/23 0403 04/03/23 1106  NA 122* 114* 116* 120* 121*  K 3.8 4.0 3.8 3.5 3.4*  CL 90* 85* 86* 91* 90*  CO2 22 18* 20* 21* 22   GLUCOSE  --  132* 125* 115* 141*  BUN 7 7* 7* 6* <5*  CREATININE 0.5* 0.55* 0.57* 0.53* 0.52*  CALCIUM 7.9* 8.1* 8.1* 7.7* 8.2*  MG  --   --  1.5*  --   --   PHOS  --   --  2.6  --   --     Liver Function Tests: Recent Labs  Lab 03/30/23 0000 04/02/23 1707  AST 19 29  ALT 25 22  ALKPHOS 76 82  BILITOT  --  0.9  PROT  --  6.3*  ALBUMIN 3.1* 3.1*   No results for input(s): "LIPASE", "AMYLASE" in the last 168 hours. No results for input(s): "AMMONIA" in the last 168 hours.  CBC: Recent Labs  Lab 04/02/23 1707 04/03/23 1106  WBC 37.9* 34.0*  NEUTROABS 8.7*  --   HGB 10.2* 9.3*  HCT 32.3* 28.2*  MCV 91.8 87.3  PLT 488* 463*    Cardiac Enzymes: No results for input(s): "CKTOTAL", "CKMB", "CKMBINDEX", "TROPONINI" in the last 168 hours.  BNP: Invalid input(s): "POCBNP"  CBG: Recent Labs  Lab 04/02/23 2102 04/03/23 0829 04/03/23 1202  GLUCAP 122* 97 147*    Microbiology: Results for orders placed or performed during the hospital encounter of 03/08/23  Culture, blood (routine x 2)     Status: None   Collection Time: 03/08/23  6:15 AM   Specimen: BLOOD  Result Value Ref Range Status   Specimen Description BLOOD LEFT  ANTECUBITAL  Final   Special Requests   Final    BOTTLES DRAWN AEROBIC AND ANAEROBIC Blood Culture adequate volume   Culture   Final    NO GROWTH 5 DAYS Performed at [facility], [address] [place], [place] [address]    Report Status 03/13/2023 FINAL  Final  Culture, blood (routine x 2)     Status: None   Collection Time: 03/08/23  6:20 AM   Specimen: BLOOD  Result Value Ref Range Status   Specimen Description BLOOD RIGHT ANTECUBITAL  Final   Special Requests   Final    BOTTLES DRAWN AEROBIC AND ANAEROBIC Blood Culture adequate volume   Culture   Final    NO GROWTH 5 DAYS Performed at [facility], [address] [place], [place] [address]    Report Status 03/13/2023 FINAL  Final  Resp panel by RT-PCR (RSV, Flu  A&B, Covid) Anterior Nasal Swab     Status: None   Collection Time: 03/08/23  6:28 AM   Specimen: Anterior Nasal Swab  Result Value Ref Range Status   SARS Coronavirus 2 by RT PCR NEGATIVE NEGATIVE Final    Comment: (NOTE) SARS-CoV-2 target nucleic acids are NOT DETECTED.  The SARS-CoV-2 RNA is generally detectable in upper respiratory specimens during the acute phase of infection. The lowest concentration of SARS-CoV-2 viral copies this assay can detect is 138 copies/mL. A negative result does not preclude SARS-Cov-2 infection and should not be used as the sole basis for treatment or other patient management decisions. A negative result may occur with  improper specimen collection/handling, submission of specimen other than nasopharyngeal swab, presence of viral mutation(s) within the areas targeted by this assay, and inadequate number of viral copies(<138 copies/mL). A negative result must be combined with clinical observations, patient history, and epidemiological information. The expected result is Negative.  Fact Sheet for Patients:  BloggerCourse.com  Fact Sheet for Healthcare Providers:  SeriousBroker.it  This test is no t yet approved or cleared by the [place] FDA and  has been authorized for detection and/or diagnosis of SARS-CoV-2 by FDA under an Emergency Use Authorization (EUA). This EUA will remain  in effect (meaning this test can be used) for the duration of the COVID-19 declaration under Section 564(b)(1) of the Act, 21 U.S.C.section 360bbb-3(b)(1), unless the authorization is terminated  or revoked sooner.       Influenza A by PCR NEGATIVE NEGATIVE Final   Influenza B by PCR NEGATIVE NEGATIVE Final    Comment: (NOTE) The Xpert Xpress SARS-CoV-2/FLU/RSV plus assay is intended as an aid in the diagnosis of influenza from Nasopharyngeal swab specimens and should not be used as a sole basis for treatment.  Nasal washings and aspirates are unacceptable for Xpert Xpress SARS-CoV-2/FLU/RSV testing.  Fact Sheet for Patients: BloggerCourse.com  Fact Sheet for Healthcare Providers: SeriousBroker.it  This test is not yet approved or cleared by the [place] FDA and has been authorized for detection and/or diagnosis of SARS-CoV-2 by FDA under an Emergency Use Authorization (EUA). This EUA will remain in effect (meaning this test can be used) for the duration of the COVID-19 declaration under Section 564(b)(1) of the Act, 21 U.S.C. section 360bbb-3(b)(1), unless the authorization is terminated or revoked.     Resp Syncytial Virus by PCR NEGATIVE NEGATIVE Final    Comment: (NOTE) Fact Sheet for Patients: BloggerCourse.com  Fact Sheet for Healthcare Providers: SeriousBroker.it  This test is not yet approved or cleared by the [place] FDA  and has been authorized for detection and/or diagnosis of SARS-CoV-2 by FDA under an Emergency Use Authorization (EUA). This EUA will remain in effect (meaning this test can be used) for the duration of the COVID-19 declaration under Section 564(b)(1) of the Act, 21 U.S.C. section 360bbb-3(b)(1), unless the authorization is terminated or revoked.  Performed at [facility], [address] [place], [place] [address]   Urine Culture     Status: None   Collection Time: 03/08/23  9:55 AM   Specimen: Urine, Random  Result Value Ref Range Status   Specimen Description   Final    URINE, RANDOM Performed at [facility], [address] [place], [place] [address]    Special Requests   Final    NONE Reflexed from [id number] Performed at [facility], [address] [place], [place] [address]    Culture   Final    NO GROWTH Performed at [facility], [address] [place], [place] [address]    Report Status  03/09/2023 FINAL  Final  MRSA Next Gen by PCR, Nasal     Status: None   Collection Time: 03/08/23  1:21 PM   Specimen: Nasal Mucosa; Nasal Swab  Result Value Ref Range Status   MRSA by PCR Next Gen NOT DETECTED NOT DETECTED Final    Comment: (NOTE) The GeneXpert MRSA Assay (FDA approved for NASAL specimens only), is one component of a comprehensive MRSA colonization surveillance program. It is not intended to diagnose MRSA infection nor to guide or monitor treatment for MRSA infections. Test performance is not FDA approved in patients less than 42 years old. Performed at [facility], [address] [place], [place] [address]     Coagulation Studies: No results for input(s): "LABPROT", "INR" in the last 72 hours.  Urinalysis: Recent Labs    04/02/23 2050  COLORURINE YELLOW*  LABSPEC 1.013  PHURINE 6.0  GLUCOSEU NEGATIVE  HGBUR NEGATIVE  BILIRUBINUR NEGATIVE  KETONESUR 5*  PROTEINUR NEGATIVE  NITRITE NEGATIVE  LEUKOCYTESUR NEGATIVE      Imaging: No results found.   Medications:    sodium chloride 100 mL/hr (04/03/23 0812)    amLODipine  5 mg Oral Daily   atorvastatin  40 mg Oral Daily   cyanocobalamin  1,000 mcg Oral Daily   enoxaparin (LOVENOX) injection  50 mg Subcutaneous Q24H   folic acid  1 mg Oral Daily   insulin aspart  0-5 Units Subcutaneous QHS   insulin aspart  0-9 Units Subcutaneous TID WC   iron polysaccharides  150 mg Oral Daily   levothyroxine  75 mcg Oral Q0600   melatonin  2.5 mg Oral QHS   tamsulosin  0.4 mg Oral QHS   acetaminophen, albuterol, hydrALAZINE, ondansetron (ZOFRAN) IV, oxyCODONE, sodium chloride  Assessment/ Plan:  Mr. GEOFFREY DICROCE is a 82 y.o.  male with past medical history of CLL, BPH, hypertension, diabetes mellitus type II, hypothyroidism, and hyperlipidemia. Patient presents to ED with abnormal labs and has been admitted for Hyponatremia [E87.1]   Hyponatremia likely secondary to increased free water  intake. Endorses persistent poor oral intake. Sodium 114 on admission. Completed breakfast today. Agree with normal saline @ 133ml/hr. Sodium 120 today. Goal of 125-128 prior to discharge. Continue salt tabs and IVF. Continue to encourage oral intake.      LOS: 1 Sasha Rogel 9/20/202412:19 PM

## 2023-04-03 NOTE — TOC Initial Note (Addendum)
Transition of Care [facility]) - Initial/Assessment Note    Patient Details  Name: Philip Robbins MRN: [id number] Date of Birth: [birth year]  Transition of Care [facility]) CM/SW Contact:    Marquita Palms, LCSW Phone Number: 04/03/2023, 11:21 AM  Clinical Narrative:                   Risk assessment complete. CSW met with patient bedside, with his wife and daughter present. Patient reports he use the pharmacy in [facility], but will use any local [facility] in [place] if needed. Patient has transportation home with family. He also reports that he has a PCP. No other needs at this time.  Expected Discharge Plan: Home w Home Health Services Barriers to Discharge: No Barriers Identified   Patient Goals and CMS Choice            Expected Discharge Plan and Services                                              Prior Living Arrangements/Services   Lives with:: Significant Other                   Activities of Daily Living      Permission Sought/Granted                  Emotional Assessment Appearance:: Appears stated age Attitude/Demeanor/Rapport: Gracious Affect (typically observed): Calm Orientation: : Oriented to Self, Oriented to Place, Oriented to  Time, Oriented to Situation      Admission diagnosis:  Hyponatremia [E87.1] Patient Active Problem List   Diagnosis Date Noted   Hypothyroidism 04/03/2023   Chronic diastolic CHF (congestive heart failure) (HCC) 04/02/2023   Obesity (BMI 30-39.9) 04/02/2023   Iron deficiency anemia 04/02/2023   Diarrhea 04/02/2023   Type 2 diabetes mellitus without complication, without long-term current use of insulin (HCC) 03/23/2023   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) 03/23/2023   Acute metabolic encephalopathy 03/20/2023   Bilateral humeral fractures 03/18/2023   Acute hypoxic respiratory failure (HCC) 03/18/2023   Hypophosphatemia 03/18/2023   Hypomagnesemia 03/18/2023   CAP (community  acquired pneumonia) 03/08/2023   Pneumonia 03/08/2023   Sepsis secondary to UTI (HCC) 03/08/2023   Hyponatremia 03/08/2023   BPH (benign prostatic hyperplasia) 12/21/2022   Irritable bowel syndrome without diarrhea 12/21/2022   Hypertension 12/21/2022   Bilateral hearing loss 01/23/2021   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/2017   Hyperlipidemia 01/17/2016   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 [place] [address] Phone: [phone] Fax: [phone]  [facility] - [place], [place] - [address] [place] [address] Phone: [phone] Fax: [phone]     Social Determinants of Health (SDOH) Social History: SDOH Screenings   Food Insecurity: No Food Insecurity (03/10/2023)  Housing: Low Risk  (03/10/2023)  Transportation Needs: No Transportation Needs (03/10/2023)  Utilities: Not At Risk (03/10/2023)  Tobacco Use: Low Risk  (03/31/2023)   SDOH Interventions:  Readmission Risk Interventions    04/03/2023   11:20 AM  Readmission Risk Prevention Plan  Transportation Screening Complete  PCP or Specialist Appt within 3-5 Days Complete  HRI or Home Care Consult Complete  Social Work Consult for Recovery Care Planning/Counseling Complete  Palliative Care Screening Not Applicable  Medication Review Oceanographer) Not Complete  Med Review Comments will review upon discharge

## 2023-04-03 NOTE — Plan of Care (Signed)
  Problem: Activity: Goal: Ability to tolerate increased activity will improve Outcome: Progressing   Problem: Pain Management: Goal: Pain level will decrease with appropriate interventions Outcome: Progressing   Problem: Education: Goal: Ability to describe self-care measures that may prevent or decrease complications (Diabetes Survival Skills Education) will improve Outcome: Progressing   Problem: Skin Integrity: Goal: Risk for impaired skin integrity will decrease Outcome: Progressing   Problem: Education: Goal: Knowledge of General Education information will improve Description: Including pain rating scale, medication(s)/side effects and non-pharmacologic comfort measures Outcome: Progressing

## 2023-04-03 NOTE — ED Notes (Signed)
External urinary catheter placed. Pericare was provided prior to application.

## 2023-04-03 NOTE — Progress Notes (Signed)
PROGRESS NOTE    Philip Robbins  EPP:[id number] DOB: July 19, 1940 DOA: 04/02/2023 PCP: Marina Goodell, MD  Assessment & Plan:   Principal Problem:   Hyponatremia Active Problems:   Diarrhea   Type 2 diabetes mellitus without complication, without long-term current use of insulin (HCC)   Hyperlipidemia   Hypertension   Chronic diastolic CHF (congestive heart failure) (HCC)   BPH (benign prostatic hyperplasia)   Iron deficiency anemia   CLL (chronic lymphocytic leukemia) (HCC)   Hypothyroidism   Obesity (BMI 30-39.9)  Assessment and Plan: Hyponatremia: likely secondary to diarrhea. Trending up. Continue on IVFs. Fluid restriction.  Continue to check Na level q6hrs. Nephro consulted    Diarrhea: c.diff & GI PCR panel ordered   DM2: well controlled, HbA1c 6.7. Continue on SSI w/ accuchecks   HLD: continue on statin    HTN: continue on amlodipine. Holding lasix. IV hydralazine prn    Chronic diastolic CHF: echo on 03/20/2023 showed EF of 60 to 65%, grade I diastolic dysfunction. Has trace leg edema, no shortness of breath, CHF compensated. Holding lasix. Monitor I/Os   BPH: continue on flomax    Iron deficiency anemia: continue on iron supplement    CLL: not on chemo currently. Likely etiology on anemia, leukocytosis & thrombocytosis. Management per onco outpatient    Hypothyroidism: continue on home dose of synthroid   Obesity: BMI 33.8. Would benefit from weight loss   Recent hx of b/l shoulder surg: suppose to have staple removal on 04/06/23. Will reach out to ortho surg if needed       DVT prophylaxis: lovenox  Code Status: full  Family Communication: discussed pt's care w/ pt's family at bedside and answered their questions  Disposition Plan:  depends on PT/OT recs (not consulted yet).  Level of care: Progressive  Status is: Inpatient Remains inpatient appropriate because: severity of illness     Consultants:  Nephro   Procedures:    Antimicrobials:  Subjective: Pt c/o fatigue   Objective: Vitals:   04/03/23 0300 04/03/23 0400 04/03/23 0600 04/03/23 0849  BP: (!) 130/52 (!) 157/57 (!) 137/51 (!) 153/55  Pulse: 66 74 64   Resp: 16 16 18    Temp:   97.8 F (36.6 C)   TempSrc:   Oral   SpO2: 95% 95% 99%   Weight:        Intake/Output Summary (Last 24 hours) at 04/03/2023 0907 Last data filed at 04/02/2023 2228 Gross per 24 hour  Intake 1000 ml  Output --  Net 1000 ml   Filed Weights   04/02/23 1705  Weight: 99.6 kg    Examination:  General exam: Appears calm but uncomfortable  Respiratory system: Clear to auscultation. Respiratory effort normal. Cardiovascular system: S1 & S2 +. No  rubs, gallops or clicks. Gastrointestinal system: Abdomen is obese, soft and nontender.  Normal bowel sounds heard. Central nervous system: Alert and oriented. Psychiatry: Judgement and insight appear normal. Mood & affect appropriate.     Data Reviewed: I have personally reviewed following labs and imaging studies  CBC: Recent Labs  Lab 04/02/23 1707  WBC 37.9*  NEUTROABS 8.7*  HGB 10.2*  HCT 32.3*  MCV 91.8  PLT 488*   Basic Metabolic Panel: Recent Labs  Lab 03/30/23 0000 04/02/23 1707 04/02/23 2049 04/03/23 0403  NA 122* 114* 116* 120*  K 3.8 4.0 3.8 3.5  CL 90* 85* 86* 91*  CO2 22 18* 20* 21*  GLUCOSE  --  132* 125* 115*  BUN 7 7* 7* 6*  CREATININE 0.5* 0.55* 0.57* 0.53*  CALCIUM 7.9* 8.1* 8.1* 7.7*  MG  --   --  1.5*  --   PHOS  --   --  2.6  --    GFR: Estimated Creatinine Clearance: 83.5 mL/min (A) (by C-G formula based on SCr of 0.53 mg/dL (L)). Liver Function Tests: Recent Labs  Lab 03/30/23 0000 04/02/23 1707  AST 19 29  ALT 25 22  ALKPHOS 76 82  BILITOT  --  0.9  PROT  --  6.3*  ALBUMIN 3.1* 3.1*   No results for input(s): "LIPASE", "AMYLASE" in the last 168 hours. No results for input(s): "AMMONIA" in the last 168 hours. Coagulation Profile: No results for input(s):  "INR", "PROTIME" in the last 168 hours. Cardiac Enzymes: No results for input(s): "CKTOTAL", "CKMB", "CKMBINDEX", "TROPONINI" in the last 168 hours. BNP (last 3 results) No results for input(s): "PROBNP" in the last 8760 hours. HbA1C: No results for input(s): "HGBA1C" in the last 72 hours. CBG: Recent Labs  Lab 04/02/23 2102 04/03/23 0829  GLUCAP 122* 97   Lipid Profile: No results for input(s): "CHOL", "HDL", "LDLCALC", "TRIG", "CHOLHDL", "LDLDIRECT" in the last 72 hours. Thyroid Function Tests: No results for input(s): "TSH", "T4TOTAL", "FREET4", "T3FREE", "THYROIDAB" in the last 72 hours. Anemia Panel: No results for input(s): "VITAMINB12", "FOLATE", "FERRITIN", "TIBC", "IRON", "RETICCTPCT" in the last 72 hours. Sepsis Labs: No results for input(s): "PROCALCITON", "LATICACIDVEN" in the last 168 hours.  No results found for this or any previous visit (from the past 240 hour(s)).       Radiology Studies: No results found.      Scheduled Meds:  amLODipine  5 mg Oral Daily   atorvastatin  40 mg Oral Daily   cyanocobalamin  1,000 mcg Oral Daily   enoxaparin (LOVENOX) injection  50 mg Subcutaneous Q24H   folic acid  1 mg Oral Daily   insulin aspart  0-5 Units Subcutaneous QHS   insulin aspart  0-9 Units Subcutaneous TID WC   iron polysaccharides  150 mg Oral Daily   levothyroxine  75 mcg Oral Q0600   melatonin  2.5 mg Oral QHS   sodium chloride  1 g Oral BID WC   tamsulosin  0.4 mg Oral QHS   Continuous Infusions:  sodium chloride 100 mL/hr (04/03/23 0812)     LOS: 1 day      Charise Killian, MD [facility] Pager [phone]  If 7PM-7AM, please contact night-coverage [email or web address]  04/03/2023, 9:07 AM

## 2023-04-04 DIAGNOSIS — E871 Hypo-osmolality and hyponatremia: Secondary | ICD-10-CM | POA: Diagnosis not present

## 2023-04-04 LAB — CBC
HCT: 28.4 % — ABNORMAL LOW (ref 39.0–52.0)
Hemoglobin: 9.7 g/dL — ABNORMAL LOW (ref 13.0–17.0)
MCH: 29.2 pg (ref 26.0–34.0)
MCHC: 34.2 g/dL (ref 30.0–36.0)
MCV: 85.5 fL (ref 80.0–100.0)
Platelets: 443 10*3/uL — ABNORMAL HIGH (ref 150–400)
RBC: 3.32 MIL/uL — ABNORMAL LOW (ref 4.22–5.81)
RDW: 16.4 % — ABNORMAL HIGH (ref 11.5–15.5)
WBC: 32.4 10*3/uL — ABNORMAL HIGH (ref 4.0–10.5)
nRBC: 0 % (ref 0.0–0.2)

## 2023-04-04 LAB — C DIFFICILE QUICK SCREEN W PCR REFLEX
C Diff antigen: NEGATIVE
C Diff interpretation: NOT DETECTED
C Diff toxin: NEGATIVE

## 2023-04-04 LAB — GASTROINTESTINAL PANEL BY PCR, STOOL (REPLACES STOOL CULTURE)

## 2023-04-04 LAB — BASIC METABOLIC PANEL
Anion gap: 6 (ref 5–15)
BUN: 5 mg/dL — ABNORMAL LOW (ref 8–23)
CO2: 23 mmol/L (ref 22–32)
Calcium: 8.1 mg/dL — ABNORMAL LOW (ref 8.9–10.3)
Chloride: 98 mmol/L (ref 98–111)
Creatinine, Ser: 0.54 mg/dL — ABNORMAL LOW (ref 0.61–1.24)
GFR, Estimated: >60 mL/min (ref >60)
Glucose, Bld: 113 mg/dL — ABNORMAL HIGH (ref 70–99)
Potassium: 4 mmol/L (ref 3.5–5.1)
Sodium: 127 mmol/L — ABNORMAL LOW (ref 135–145)

## 2023-04-04 LAB — GLUCOSE, CAPILLARY
Glucose-Capillary: 131 mg/dL — ABNORMAL HIGH (ref 70–99)
Glucose-Capillary: 170 mg/dL — ABNORMAL HIGH (ref 70–99)
Glucose-Capillary: 265 mg/dL — ABNORMAL HIGH (ref 70–99)
Glucose-Capillary: 159 mg/dL — ABNORMAL HIGH (ref 70–99)

## 2023-04-04 MED ORDER — ENSURE ENLIVE PO LIQD
237.0000 mL | ORAL | Status: DC
  Administered 2023-04-04 – 2023-04-07 (×4): 237 mL via ORAL

## 2023-04-04 MED ORDER — NYSTATIN 100000 UNIT/GM EX CREA
TOPICAL_CREAM | Freq: Two times a day (BID) | CUTANEOUS | Status: DC
  Administered 2023-04-07: 1 via TOPICAL
  Filled 2023-04-04: qty 30

## 2023-04-04 NOTE — Plan of Care (Signed)
  Problem: Education: Goal: Ability to describe self-care measures that may prevent or decrease complications (Diabetes Survival Skills Education) will improve Outcome: Progressing   Problem: Activity: Goal: Ability to tolerate increased activity will improve Outcome: Progressing   Problem: Pain Management: Goal: Pain level will decrease with appropriate interventions Outcome: Progressing   Problem: Elimination: Goal: Will not experience complications related to bowel motility Outcome: Progressing   Problem: Skin Integrity: Goal: Risk for impaired skin integrity will decrease Outcome: Progressing

## 2023-04-04 NOTE — Progress Notes (Signed)
PROGRESS NOTE    Philip Robbins  ZOX:[id number] DOB: 04/08/1941 DOA: 04/02/2023 PCP: Marina Goodell, MD  Assessment & Plan:   Principal Problem:   Hyponatremia Active Problems:   Diarrhea   Type 2 diabetes mellitus without complication, without long-term current use of insulin (HCC)   Hyperlipidemia   Hypertension   Chronic diastolic CHF (congestive heart failure) (HCC)   BPH (benign prostatic hyperplasia)   Iron deficiency anemia   CLL (chronic lymphocytic leukemia) (HCC)   Hypothyroidism   Obesity (BMI 30-39.9)  Assessment and Plan: Hyponatremia: likely secondary to diarrhea. Currently 127, improving. Continue on IVFs, NaCl tabs & fluid restriction. Nephro following and recs apprec     Diarrhea: c.diff & GI PCR panel are both neg    DM2: well controlled, HbA1c 6.7. Continue on SSI w/ accuchecks    HLD: continue on statin    HTN: continue on amlodipine. Holding lasix. IV hydralazine prn    Chronic diastolic CHF: echo on 03/20/2023 showed EF of 60 to 65%, grade I diastolic dysfunction. Has trace leg edema, no shortness of breath, CHF compensated. Holding lasix. Monitor I/Os   BPH: continue on flomax    Iron deficiency anemia: continue on iron supplement    CLL: not on chemo currently. Likely etiology on anemia, leukocytosis & thrombocytosis. Management per onco outpatient    Hypothyroidism: continue on levothyroxine   Obesity: BMI 33.8. would benefit from weight loss    Recent hx of b/l shoulder surg: suppose to have staple removal on 04/06/23. Will reach out to ortho surg if needed       DVT prophylaxis: lovenox  Code Status: full  Family Communication: discussed pt's care w/ pt's family at bedside and answered their questions  Disposition Plan:  depends on PT/OT recs   Level of care: Telemetry Medical  Status is: Inpatient Remains inpatient appropriate because: severity of illness     Consultants:  Nephro   Procedures:    Antimicrobials:  Subjective: Pt c/o malaise   Objective: Vitals:   04/03/23 1500 04/03/23 1632 04/03/23 2358 04/04/23 0807  BP: (!) 135/52 (!) 178/88 (!) 129/44 (!) 149/54  Pulse: 73 77 86 88  Resp: (!) 21 17 16 17   Temp:  97.7 F (36.5 C) 98.2 F (36.8 C) 99.5 F (37.5 C)  TempSrc:   Oral   SpO2:  98% 95% 96%  Weight:        Intake/Output Summary (Last 24 hours) at 04/04/2023 0825 Last data filed at 04/04/2023 0640 Gross per 24 hour  Intake 1774.46 ml  Output 1550 ml  Net 224.46 ml   Filed Weights   04/02/23 1705  Weight: 99.6 kg    Examination:  General exam: appears calm & comfortable  Respiratory system: clear breath sounds b/l Cardiovascular system: S1/S2+. No rubs or clicks  Gastrointestinal system: Abd is soft, NT, obese & hypoactive bowel sounds Central nervous system: alert & oriented. Moves all extremities  Psychiatry: Judgement and insight appears not at baseline. Flat mood and affect     Data Reviewed: I have personally reviewed following labs and imaging studies  CBC: Recent Labs  Lab 04/02/23 1707 04/03/23 1106 04/04/23 0400  WBC 37.9* 34.0* 32.4*  NEUTROABS 8.7*  --   --   HGB 10.2* 9.3* 9.7*  HCT 32.3* 28.2* 28.4*  MCV 91.8 87.3 85.5  PLT 488* 463* 443*   Basic Metabolic Panel: Recent Labs  Lab 04/02/23 2049 04/03/23 0403 04/03/23 1106 04/03/23 1559 04/03/23 1946 04/04/23 0400  NA 116* 120* 121* 123* 125* 127*  K 3.8 3.5 3.4* 3.8 3.8 4.0  CL 86* 91* 90* 92* 91* 98  CO2 20* 21* 22 22 22 23   GLUCOSE 125* 115* 141* 163* 147* 113*  BUN 7* 6* <5* 5* 7* 5*  CREATININE 0.57* 0.53* 0.52* 0.64 0.74 0.54*  CALCIUM 8.1* 7.7* 8.2* 8.1* 8.4* 8.1*  MG 1.5*  --   --   --   --   --   PHOS 2.6  --   --   --   --   --    GFR: Estimated Creatinine Clearance: 83.5 mL/min (A) (by C-G formula based on SCr of 0.54 mg/dL (L)). Liver Function Tests: Recent Labs  Lab 03/30/23 0000 04/02/23 1707  AST 19 29  ALT 25 22  ALKPHOS 76 82   BILITOT  --  0.9  PROT  --  6.3*  ALBUMIN 3.1* 3.1*   No results for input(s): "LIPASE", "AMYLASE" in the last 168 hours. No results for input(s): "AMMONIA" in the last 168 hours. Coagulation Profile: No results for input(s): "INR", "PROTIME" in the last 168 hours. Cardiac Enzymes: No results for input(s): "CKTOTAL", "CKMB", "CKMBINDEX", "TROPONINI" in the last 168 hours. BNP (last 3 results) No results for input(s): "PROBNP" in the last 8760 hours. HbA1C: No results for input(s): "HGBA1C" in the last 72 hours. CBG: Recent Labs  Lab 04/03/23 1202 04/03/23 1401 04/03/23 1635 04/03/23 2136 04/04/23 0810  GLUCAP 147* 160* 163* 119* 131*   Lipid Profile: No results for input(s): "CHOL", "HDL", "LDLCALC", "TRIG", "CHOLHDL", "LDLDIRECT" in the last 72 hours. Thyroid Function Tests: No results for input(s): "TSH", "T4TOTAL", "FREET4", "T3FREE", "THYROIDAB" in the last 72 hours. Anemia Panel: No results for input(s): "VITAMINB12", "FOLATE", "FERRITIN", "TIBC", "IRON", "RETICCTPCT" in the last 72 hours. Sepsis Labs: No results for input(s): "PROCALCITON", "LATICACIDVEN" in the last 168 hours.  No results found for this or any previous visit (from the past 240 hour(s)).       Radiology Studies: No results found.      Scheduled Meds:  amLODipine  5 mg Oral Daily   atorvastatin  40 mg Oral Daily   cyanocobalamin  1,000 mcg Oral Daily   enoxaparin (LOVENOX) injection  50 mg Subcutaneous Q24H   folic acid  1 mg Oral Daily   insulin aspart  0-5 Units Subcutaneous QHS   insulin aspart  0-9 Units Subcutaneous TID WC   iron polysaccharides  150 mg Oral Daily   levothyroxine  75 mcg Oral Q0600   melatonin  2.5 mg Oral QHS   sodium chloride  1 g Oral BID WC   tamsulosin  0.4 mg Oral QHS   Continuous Infusions:  sodium chloride 1,000 mL (04/03/23 2303)     LOS: 2 days      Charise Killian, MD [facility] Pager [phone]  If 7PM-7AM, please contact  night-coverage [email or web address]  04/04/2023, 8:25 AM

## 2023-04-04 NOTE — Progress Notes (Addendum)
[facility]  ROUNDING NOTE   Subjective:   Philip Robbins is a 82 y.o. male with past medical history of CLL, BPH, hypertension, diabetes mellitus type II, hypothyroidism, and hyperlipidemia. Patient presents to ED with abnormal labs and has been admitted for Hyponatremia [E87.1] Diarrhea, unspecified type [R19.7]  Patient is known to our practice from previous admission.   Patient seen sitting up in bed, alert and oriented Daughter at bedside Appetite has improved since admission Denies pain or discomfort  Bilateral arm slings in place  Sodium 127  Objective:  Vital signs in last 24 hours:  Temp:  [97.7 F (36.5 C)-99.5 F (37.5 C)] 99.5 F (37.5 C) (09/21 0807) Pulse Rate:  [73-88] 88 (09/21 0807) Resp:  [16-26] 17 (09/21 0807) BP: (129-178)/(44-88) 149/54 (09/21 0807) SpO2:  [95 %-98 %] 96 % (09/21 0807)  Weight change:  Filed Weights   04/02/23 1705  Weight: 99.6 kg    Intake/Output: I/O last 3 completed shifts: In: 2774.5 [P.O.:540; I.V.:1234.5; IV Piggyback:1000] Out: 1850 [Urine:1850]   Intake/Output this shift:  Total I/O In: 240 [P.O.:240] Out: -   Physical Exam: General: NAD  Head: Normocephalic, atraumatic. Moist oral mucosal membranes  Eyes: Anicteric  Lungs:  Clear to auscultation, normal effort  Heart: Regular rate and rhythm  Abdomen:  Soft, nontender  Extremities:  1+ peripheral edema.  BUE slings  Neurologic: Alert, moving all four extremities  Skin: No lesions  Access: None    Basic Metabolic Panel: Recent Labs  Lab 04/02/23 2049 04/03/23 0403 04/03/23 1106 04/03/23 1559 04/03/23 1946 04/04/23 0400  NA 116* 120* 121* 123* 125* 127*  K 3.8 3.5 3.4* 3.8 3.8 4.0  CL 86* 91* 90* 92* 91* 98  CO2 20* 21* 22 22 22 23   GLUCOSE 125* 115* 141* 163* 147* 113*  BUN 7* 6* <5* 5* 7* 5*  CREATININE 0.57* 0.53* 0.52* 0.64 0.74 0.54*  CALCIUM 8.1* 7.7* 8.2* 8.1* 8.4* 8.1*  MG 1.5*  --   --   --   --   --   PHOS 2.6  --   --    --   --   --     Liver Function Tests: Recent Labs  Lab 03/30/23 0000 04/02/23 1707  AST 19 29  ALT 25 22  ALKPHOS 76 82  BILITOT  --  0.9  PROT  --  6.3*  ALBUMIN 3.1* 3.1*   No results for input(s): "LIPASE", "AMYLASE" in the last 168 hours. No results for input(s): "AMMONIA" in the last 168 hours.  CBC: Recent Labs  Lab 04/02/23 1707 04/03/23 1106 04/04/23 0400  WBC 37.9* 34.0* 32.4*  NEUTROABS 8.7*  --   --   HGB 10.2* 9.3* 9.7*  HCT 32.3* 28.2* 28.4*  MCV 91.8 87.3 85.5  PLT 488* 463* 443*    Cardiac Enzymes: No results for input(s): "CKTOTAL", "CKMB", "CKMBINDEX", "TROPONINI" in the last 168 hours.  BNP: Invalid input(s): "POCBNP"  CBG: Recent Labs  Lab 04/03/23 1401 04/03/23 1635 04/03/23 2136 04/04/23 0810 04/04/23 1125  GLUCAP 160* 163* 119* 131* 170*    Microbiology: Results for orders placed or performed during the hospital encounter of 04/02/23  C Difficile Quick Screen w PCR reflex     Status: None   Collection Time: 04/04/23  8:00 AM   Specimen: STOOL  Result Value Ref Range Status   C Diff antigen NEGATIVE NEGATIVE Final   C Diff toxin NEGATIVE NEGATIVE Final   C Diff interpretation No  C. difficile detected.  Final    Comment: Performed at [facility], [address] [place], [place] [address]    Coagulation Studies: No results for input(s): "LABPROT", "INR" in the last 72 hours.  Urinalysis: Recent Labs    04/02/23 2050  COLORURINE YELLOW*  LABSPEC 1.013  PHURINE 6.0  GLUCOSEU NEGATIVE  HGBUR NEGATIVE  BILIRUBINUR NEGATIVE  KETONESUR 5*  PROTEINUR NEGATIVE  NITRITE NEGATIVE  LEUKOCYTESUR NEGATIVE      Imaging: No results found.   Medications:    sodium chloride 100 mL/hr at 04/04/23 6213    amLODipine  5 mg Oral Daily   atorvastatin  40 mg Oral Daily   cyanocobalamin  1,000 mcg Oral Daily   enoxaparin (LOVENOX) injection  50 mg Subcutaneous Q24H   feeding supplement  237 mL Oral Q24H   folic acid   1 mg Oral Daily   insulin aspart  0-5 Units Subcutaneous QHS   insulin aspart  0-9 Units Subcutaneous TID WC   iron polysaccharides  150 mg Oral Daily   levothyroxine  75 mcg Oral Q0600   melatonin  2.5 mg Oral QHS   nystatin cream   Topical BID   sodium chloride  1 g Oral BID WC   tamsulosin  0.4 mg Oral QHS   acetaminophen, albuterol, hydrALAZINE, ondansetron (ZOFRAN) IV, oxyCODONE, sodium chloride  Assessment/ Plan:  [name] is a 82 y.o.  male with past medical history of CLL, BPH, hypertension, diabetes mellitus type II, hypothyroidism, and hyperlipidemia. Patient presents to ED with abnormal labs and has been admitted for Hyponatremia [E87.1] Diarrhea, unspecified type [R19.7]   Hyponatremia likely secondary to ? Severe diarrhea prior to admission. Endorses persistent poor oral intake. Sodium 114 on admission.  ` -Sodium 127.  Improved with IV saline which points more towards volume related causes.  -Appetite appropriate.  Will order daily Ensure for supplementation  -Reasonable fluid restriction of about 1 L/day but can drink more thirsty.  -Cleared to discharge from renal stance.  Will schedule follow-up appointment in our office.     LOS: 2 Philip Robbins 9/21/202411:42 AM  Patient was seen and examined with Philip Beavers, NP.  I personally formulated plan of care for the problems addressed and discussed with NP.  I agree with the note as documented except as noted below. I take the responsibility for the inherent risk of patient management.

## 2023-04-05 DIAGNOSIS — E871 Hypo-osmolality and hyponatremia: Secondary | ICD-10-CM | POA: Diagnosis not present

## 2023-04-05 LAB — BASIC METABOLIC PANEL
Anion gap: 6 (ref 5–15)
BUN: 7 mg/dL — ABNORMAL LOW (ref 8–23)
CO2: 23 mmol/L (ref 22–32)
Calcium: 7.9 mg/dL — ABNORMAL LOW (ref 8.9–10.3)
Chloride: 101 mmol/L (ref 98–111)
Creatinine, Ser: 0.49 mg/dL — ABNORMAL LOW (ref 0.61–1.24)
GFR, Estimated: >60 mL/min (ref >60)
Glucose, Bld: 148 mg/dL — ABNORMAL HIGH (ref 70–99)
Potassium: 3.8 mmol/L (ref 3.5–5.1)
Sodium: 130 mmol/L — ABNORMAL LOW (ref 135–145)

## 2023-04-05 LAB — GLUCOSE, CAPILLARY
Glucose-Capillary: 145 mg/dL — ABNORMAL HIGH (ref 70–99)
Glucose-Capillary: 247 mg/dL — ABNORMAL HIGH (ref 70–99)
Glucose-Capillary: 184 mg/dL — ABNORMAL HIGH (ref 70–99)
Glucose-Capillary: 178 mg/dL — ABNORMAL HIGH (ref 70–99)

## 2023-04-05 LAB — CBC
HCT: 26.9 % — ABNORMAL LOW (ref 39.0–52.0)
Hemoglobin: 8.9 g/dL — ABNORMAL LOW (ref 13.0–17.0)
MCH: 29 pg (ref 26.0–34.0)
MCHC: 33.1 g/dL (ref 30.0–36.0)
MCV: 87.6 fL (ref 80.0–100.0)
Platelets: 405 10*3/uL — ABNORMAL HIGH (ref 150–400)
RBC: 3.07 MIL/uL — ABNORMAL LOW (ref 4.22–5.81)
RDW: 16.9 % — ABNORMAL HIGH (ref 11.5–15.5)
WBC: 30.5 10*3/uL — ABNORMAL HIGH (ref 4.0–10.5)
nRBC: 0 % (ref 0.0–0.2)

## 2023-04-05 NOTE — Plan of Care (Signed)
  Problem: Education: Goal: Knowledge of the prescribed therapeutic regimen will improve Outcome: Progressing Goal: Understanding of activity limitations/precautions following surgery will improve Outcome: Progressing Goal: Individualized Educational Video(s) Outcome: Progressing   Problem: Activity: Goal: Ability to tolerate increased activity will improve Outcome: Progressing   Problem: Pain Management: Goal: Pain level will decrease with appropriate interventions Outcome: Progressing   Problem: Education: Goal: Ability to describe self-care measures that may prevent or decrease complications (Diabetes Survival Skills Education) will improve Outcome: Progressing Goal: Individualized Educational Video(s) Outcome: Progressing   Problem: Coping: Goal: Ability to adjust to condition or change in health will improve Outcome: Progressing   Problem: Fluid Volume: Goal: Ability to maintain a balanced intake and output will improve Outcome: Progressing   Problem: Health Behavior/Discharge Planning: Goal: Ability to identify and utilize available resources and services will improve Outcome: Progressing Goal: Ability to manage health-related needs will improve Outcome: Progressing   Problem: Metabolic: Goal: Ability to maintain appropriate glucose levels will improve Outcome: Progressing   Problem: Nutritional: Goal: Maintenance of adequate nutrition will improve Outcome: Progressing Goal: Progress toward achieving an optimal weight will improve Outcome: Progressing   Problem: Skin Integrity: Goal: Risk for impaired skin integrity will decrease Outcome: Progressing   Problem: Tissue Perfusion: Goal: Adequacy of tissue perfusion will improve Outcome: Progressing   Problem: Education: Goal: Knowledge of General Education information will improve Description: Including pain rating scale, medication(s)/side effects and non-pharmacologic comfort measures Outcome:  Progressing   Problem: Health Behavior/Discharge Planning: Goal: Ability to manage health-related needs will improve Outcome: Progressing   Problem: Clinical Measurements: Goal: Ability to maintain clinical measurements within normal limits will improve Outcome: Progressing Goal: Will remain free from infection Outcome: Progressing Goal: Diagnostic test results will improve Outcome: Progressing Goal: Respiratory complications will improve Outcome: Progressing Goal: Cardiovascular complication will be avoided Outcome: Progressing   Problem: Activity: Goal: Risk for activity intolerance will decrease Outcome: Progressing   Problem: Nutrition: Goal: Adequate nutrition will be maintained Outcome: Progressing   Problem: Coping: Goal: Level of anxiety will decrease Outcome: Progressing   Problem: Elimination: Goal: Will not experience complications related to bowel motility Outcome: Progressing Goal: Will not experience complications related to urinary retention Outcome: Progressing   Problem: Pain Managment: Goal: General experience of comfort will improve Outcome: Progressing   Problem: Safety: Goal: Ability to remain free from injury will improve Outcome: Progressing   Problem: Skin Integrity: Goal: Risk for impaired skin integrity will decrease Outcome: Progressing

## 2023-04-05 NOTE — Progress Notes (Signed)
[facility]  ROUNDING NOTE   Subjective:   Philip Robbins is a 82 y.o. male with past medical history of CLL, BPH, hypertension, diabetes mellitus type II, hypothyroidism, and hyperlipidemia. Patient presents to ED with abnormal labs and has been admitted for Hyponatremia [E87.1] Diarrhea, unspecified type [R19.7]  Patient is known to our practice from previous admission.   Patient sitting up in chair, wife at bedside Alert and oriented  Reports discomfort while sitting up Appetite remains appropriate  Bilateral arm slings in place  Sodium 130 Normal saline at 100 mL/h  Objective:  Vital signs in last 24 hours:  Temp:  [97.9 F (36.6 C)-98.2 F (36.8 C)] 97.9 F (36.6 C) (09/22 0746) Pulse Rate:  [71-88] 71 (09/22 0746) Resp:  [18] 18 (09/22 0746) BP: (131-147)/(47-60) 146/47 (09/22 0746) SpO2:  [94 %-96 %] 96 % (09/22 0746) Weight:  [100 kg] 100 kg (09/22 0500)  Weight change:  Filed Weights   04/02/23 1705 04/05/23 0500  Weight: 99.6 kg 100 kg    Intake/Output: I/O last 3 completed shifts: In: 1460 [P.O.:1460] Out: 1850 [Urine:1850]   Intake/Output this shift:  Total I/O In: 200 [P.O.:200] Out: 1250 [Urine:1250]  Physical Exam: General: NAD, sitting in chair  Head: Normocephalic, atraumatic. Moist oral mucosal membranes  Eyes: Anicteric  Lungs:  Clear to auscultation, normal effort  Heart: Regular rate and rhythm  Abdomen:  Soft, nontender  Extremities: No peripheral edema.  BUE slings  Neurologic: Alert, moving all four extremities  Skin: No lesions  Access: None    Basic Metabolic Panel: Recent Labs  Lab 04/02/23 2049 04/03/23 0403 04/03/23 1106 04/03/23 1559 04/03/23 1946 04/04/23 0400 04/05/23 0343  NA 116*   < > 121* 123* 125* 127* 130*  K 3.8   < > 3.4* 3.8 3.8 4.0 3.8  CL 86*   < > 90* 92* 91* 98 101  CO2 20*   < > 22 22 22 23 23   GLUCOSE 125*   < > 141* 163* 147* 113* 148*  BUN 7*   < > <5* 5* 7* 5* 7*  CREATININE  0.57*   < > 0.52* 0.64 0.74 0.54* 0.49*  CALCIUM 8.1*   < > 8.2* 8.1* 8.4* 8.1* 7.9*  MG 1.5*  --   --   --   --   --   --   PHOS 2.6  --   --   --   --   --   --    < > = values in this interval not displayed.    Liver Function Tests: Recent Labs  Lab 03/30/23 0000 04/02/23 1707  AST 19 29  ALT 25 22  ALKPHOS 76 82  BILITOT  --  0.9  PROT  --  6.3*  ALBUMIN 3.1* 3.1*   No results for input(s): "LIPASE", "AMYLASE" in the last 168 hours. No results for input(s): "AMMONIA" in the last 168 hours.  CBC: Recent Labs  Lab 04/02/23 1707 04/03/23 1106 04/04/23 0400 04/05/23 0343  WBC 37.9* 34.0* 32.4* 30.5*  NEUTROABS 8.7*  --   --   --   HGB 10.2* 9.3* 9.7* 8.9*  HCT 32.3* 28.2* 28.4* 26.9*  MCV 91.8 87.3 85.5 87.6  PLT 488* 463* 443* 405*    Cardiac Enzymes: No results for input(s): "CKTOTAL", "CKMB", "CKMBINDEX", "TROPONINI" in the last 168 hours.  BNP: Invalid input(s): "POCBNP"  CBG: Recent Labs  Lab 04/04/23 1125 04/04/23 1657 04/04/23 2100 04/05/23 0800 04/05/23 1136  GLUCAP 170* 265* 159* 145* 247*    Microbiology: Results for orders placed or performed during the hospital encounter of 04/02/23  Gastrointestinal Panel by PCR , Stool     Status: None   Collection Time: 04/04/23  8:00 AM   Specimen: Stool  Result Value Ref Range Status   Campylobacter species NOT DETECTED NOT DETECTED Final   Plesimonas shigelloides NOT DETECTED NOT DETECTED Final   Salmonella species NOT DETECTED NOT DETECTED Final   Yersinia enterocolitica NOT DETECTED NOT DETECTED Final   Vibrio species NOT DETECTED NOT DETECTED Final   Vibrio cholerae NOT DETECTED NOT DETECTED Final   Enteroaggregative E coli (EAEC) NOT DETECTED NOT DETECTED Final   Enteropathogenic E coli (EPEC) NOT DETECTED NOT DETECTED Final   Enterotoxigenic E coli (ETEC) NOT DETECTED NOT DETECTED Final   Shiga like toxin producing E coli (STEC) NOT DETECTED NOT DETECTED Final   Shigella/Enteroinvasive E  coli (EIEC) NOT DETECTED NOT DETECTED Final   Cryptosporidium NOT DETECTED NOT DETECTED Final   Cyclospora cayetanensis NOT DETECTED NOT DETECTED Final   Entamoeba histolytica NOT DETECTED NOT DETECTED Final   Giardia lamblia NOT DETECTED NOT DETECTED Final   Adenovirus F40/41 NOT DETECTED NOT DETECTED Final   Astrovirus NOT DETECTED NOT DETECTED Final   Norovirus GI/GII NOT DETECTED NOT DETECTED Final   Rotavirus A NOT DETECTED NOT DETECTED Final   Sapovirus (I, II, IV, and V) NOT DETECTED NOT DETECTED Final    Comment: Performed at [facility], [address] [place], [place] [address]  C Difficile Quick Screen w PCR reflex     Status: None   Collection Time: 04/04/23  8:00 AM   Specimen: STOOL  Result Value Ref Range Status   C Diff antigen NEGATIVE NEGATIVE Final   C Diff toxin NEGATIVE NEGATIVE Final   C Diff interpretation No C. difficile detected.  Final    Comment: Performed at [facility], [address] [place], [place] [address]    Coagulation Studies: No results for input(s): "LABPROT", "INR" in the last 72 hours.  Urinalysis: Recent Labs    04/02/23 2050  COLORURINE YELLOW*  LABSPEC 1.013  PHURINE 6.0  GLUCOSEU NEGATIVE  HGBUR NEGATIVE  BILIRUBINUR NEGATIVE  KETONESUR 5*  PROTEINUR NEGATIVE  NITRITE NEGATIVE  LEUKOCYTESUR NEGATIVE      Imaging: No results found.   Medications:      amLODipine  5 mg Oral Daily   atorvastatin  40 mg Oral Daily   cyanocobalamin  1,000 mcg Oral Daily   enoxaparin (LOVENOX) injection  50 mg Subcutaneous Q24H   feeding supplement  237 mL Oral Q24H   folic acid  1 mg Oral Daily   insulin aspart  0-5 Units Subcutaneous QHS   insulin aspart  0-9 Units Subcutaneous TID WC   iron polysaccharides  150 mg Oral Daily   levothyroxine  75 mcg Oral Q0600   melatonin  2.5 mg Oral QHS   nystatin cream   Topical BID   sodium chloride  1 g Oral BID WC   tamsulosin  0.4 mg Oral QHS   acetaminophen,  albuterol, hydrALAZINE, ondansetron (ZOFRAN) IV, oxyCODONE, sodium chloride  Assessment/ Plan:  Mr. Philip Robbins is a 82 y.o.  male with past medical history of CLL, BPH, hypertension, diabetes mellitus type II, hypothyroidism, and hyperlipidemia. Patient presents to ED with abnormal labs and has been admitted for Hyponatremia [E87.1] Diarrhea, unspecified type [R19.7]   Hyponatremia likely secondary to ? Severe diarrhea  prior to admission. Endorses persistent poor oral intake. Sodium 114 on admission.  ` -Sodium 130.   -Continue to encourage oral intake with Ensure supplementation.  -Due to increased appetite, will stop IV fluids.  -Reasonable fluid restriction of about 1 L/day but can drink more if thirsty.  - Will schedule follow-up appointment in our office.     LOS: 3 Victor Langenbach 9/22/202411:42 AM

## 2023-04-05 NOTE — Progress Notes (Signed)
PROGRESS NOTE    Philip Robbins  FYB:[id number] DOB: [birth year] DOA: 04/02/2023 PCP: Marina Goodell, MD  Assessment & Plan:   Principal Problem:   Hyponatremia Active Problems:   Diarrhea   Type 2 diabetes mellitus without complication, without long-term current use of insulin (HCC)   Hyperlipidemia   Hypertension   Chronic diastolic CHF (congestive heart failure) (HCC)   BPH (benign prostatic hyperplasia)   Iron deficiency anemia   CLL (chronic lymphocytic leukemia) (HCC)   Hypothyroidism   Obesity (BMI 30-39.9)  Assessment and Plan: Hyponatremia: likely secondary to diarrhea. 130. D/c IVFs. Continue on NaCl tabs. Nephro following and recs apprec    Diarrhea: c.diff & GI PCR panel are both neg    DM2: well controlled, HbA1c 6.7. Continue on SSI w/ accuchecks    HLD: continue on statin    HTN: continue on amlodipine. Holding lasix. IV hydralazine prn    Chronic diastolic CHF: echo on 03/20/2023 showed EF of 60 to 65%, grade I diastolic dysfunction. Has trace leg edema, no shortness of breath, CHF compensated. Holding lasix. Monitor I/Os   BPH: continue on flomax    Iron deficiency anemia: continue on iron supplement    CLL: not on chemo currently. Likely etiology on anemia, leukocytosis & thrombocytosis. Management per onco outpatient    Hypothyroidism: continue on levothyroxine   Obesity: BMI 33.8. Would benefit from weight loss   Recent hx of b/l shoulder surg: suppose to have staple removal on 04/06/23. Will reach out to ortho surg tomorrow       DVT prophylaxis: lovenox  Code Status: full  Family Communication: discussed pt's care w/ pt's family at bedside and answered their questions  Disposition Plan:  depends on PT/OT recs   Level of care: Telemetry Medical  Status is: Inpatient Remains inpatient appropriate because: severity of illness     Consultants:  Nephro   Procedures:   Antimicrobials:  Subjective: Pt c/o fatigue    Objective: Vitals:   04/04/23 1654 04/05/23 0117 04/05/23 0500 04/05/23 0746  BP: 131/60 (!) 147/48  (!) 146/47  Pulse: 88 84  71  Resp: 18 18  18   Temp: 98 F (36.7 C) 98.2 F (36.8 C)  97.9 F (36.6 C)  TempSrc: Oral     SpO2: 95% 94%  96%  Weight:   100 kg     Intake/Output Summary (Last 24 hours) at 04/05/2023 0856 Last data filed at 04/05/2023 0705 Gross per 24 hour  Intake 920 ml  Output 1100 ml  Net -180 ml   Filed Weights   04/02/23 1705 04/05/23 0500  Weight: 99.6 kg 100 kg    Examination:  General exam: appears comfortable   Respiratory system: clear breath sounds b/l Cardiovascular system: S1 & S2+. No rubs or clicks  Gastrointestinal system: abd is soft, NT, obese & normal bowel sounds  Central nervous system: alert & awake. Moves all extremities   Psychiatry: judgement and insight appears at baseline. Flat mood and affect     Data Reviewed: I have personally reviewed following labs and imaging studies  CBC: Recent Labs  Lab 04/02/23 1707 04/03/23 1106 04/04/23 0400 04/05/23 0343  WBC 37.9* 34.0* 32.4* 30.5*  NEUTROABS 8.7*  --   --   --   HGB 10.2* 9.3* 9.7* 8.9*  HCT 32.3* 28.2* 28.4* 26.9*  MCV 91.8 87.3 85.5 87.6  PLT 488* 463* 443* 405*   Basic Metabolic Panel: Recent Labs  Lab 04/02/23 2049 04/03/23 0403 04/03/23 1106  04/03/23 1559 04/03/23 1946 04/04/23 0400 04/05/23 0343  NA 116*   < > 121* 123* 125* 127* 130*  K 3.8   < > 3.4* 3.8 3.8 4.0 3.8  CL 86*   < > 90* 92* 91* 98 101  CO2 20*   < > 22 22 22 23 23   GLUCOSE 125*   < > 141* 163* 147* 113* 148*  BUN 7*   < > <5* 5* 7* 5* 7*  CREATININE 0.57*   < > 0.52* 0.64 0.74 0.54* 0.49*  CALCIUM 8.1*   < > 8.2* 8.1* 8.4* 8.1* 7.9*  MG 1.5*  --   --   --   --   --   --   PHOS 2.6  --   --   --   --   --   --    < > = values in this interval not displayed.   GFR: Estimated Creatinine Clearance: 83.8 mL/min (A) (by C-G formula based on SCr of 0.49 mg/dL (L)). Liver Function  Tests: Recent Labs  Lab 03/30/23 0000 04/02/23 1707  AST 19 29  ALT 25 22  ALKPHOS 76 82  BILITOT  --  0.9  PROT  --  6.3*  ALBUMIN 3.1* 3.1*   No results for input(s): "LIPASE", "AMYLASE" in the last 168 hours. No results for input(s): "AMMONIA" in the last 168 hours. Coagulation Profile: No results for input(s): "INR", "PROTIME" in the last 168 hours. Cardiac Enzymes: No results for input(s): "CKTOTAL", "CKMB", "CKMBINDEX", "TROPONINI" in the last 168 hours. BNP (last 3 results) No results for input(s): "PROBNP" in the last 8760 hours. HbA1C: No results for input(s): "HGBA1C" in the last 72 hours. CBG: Recent Labs  Lab 04/04/23 0810 04/04/23 1125 04/04/23 1657 04/04/23 2100 04/05/23 0800  GLUCAP 131* 170* 265* 159* 145*   Lipid Profile: No results for input(s): "CHOL", "HDL", "LDLCALC", "TRIG", "CHOLHDL", "LDLDIRECT" in the last 72 hours. Thyroid Function Tests: No results for input(s): "TSH", "T4TOTAL", "FREET4", "T3FREE", "THYROIDAB" in the last 72 hours. Anemia Panel: No results for input(s): "VITAMINB12", "FOLATE", "FERRITIN", "TIBC", "IRON", "RETICCTPCT" in the last 72 hours. Sepsis Labs: No results for input(s): "PROCALCITON", "LATICACIDVEN" in the last 168 hours.  Recent Results (from the past 240 hour(s))  Gastrointestinal Panel by PCR , Stool     Status: None   Collection Time: 04/04/23  8:00 AM   Specimen: Stool  Result Value Ref Range Status   Campylobacter species NOT DETECTED NOT DETECTED Final   Plesimonas shigelloides NOT DETECTED NOT DETECTED Final   Salmonella species NOT DETECTED NOT DETECTED Final   Yersinia enterocolitica NOT DETECTED NOT DETECTED Final   Vibrio species NOT DETECTED NOT DETECTED Final   Vibrio cholerae NOT DETECTED NOT DETECTED Final   Enteroaggregative E coli (EAEC) NOT DETECTED NOT DETECTED Final   Enteropathogenic E coli (EPEC) NOT DETECTED NOT DETECTED Final   Enterotoxigenic E coli (ETEC) NOT DETECTED NOT DETECTED Final    Shiga like toxin producing E coli (STEC) NOT DETECTED NOT DETECTED Final   Shigella/Enteroinvasive E coli (EIEC) NOT DETECTED NOT DETECTED Final   Cryptosporidium NOT DETECTED NOT DETECTED Final   Cyclospora cayetanensis NOT DETECTED NOT DETECTED Final   Entamoeba histolytica NOT DETECTED NOT DETECTED Final   Giardia lamblia NOT DETECTED NOT DETECTED Final   Adenovirus F40/41 NOT DETECTED NOT DETECTED Final   Astrovirus NOT DETECTED NOT DETECTED Final   Norovirus GI/GII NOT DETECTED NOT DETECTED Final   Rotavirus A NOT DETECTED NOT  DETECTED Final   Sapovirus (I, II, IV, and V) NOT DETECTED NOT DETECTED Final    Comment: Performed at [facility], [address] [place], [place] [address]  C Difficile Quick Screen w PCR reflex     Status: None   Collection Time: 04/04/23  8:00 AM   Specimen: STOOL  Result Value Ref Range Status   C Diff antigen NEGATIVE NEGATIVE Final   C Diff toxin NEGATIVE NEGATIVE Final   C Diff interpretation No C. difficile detected.  Final    Comment: Performed at [facility], [address], [place], [place] [address]         Radiology Studies: No results found.      Scheduled Meds:  amLODipine  5 mg Oral Daily   atorvastatin  40 mg Oral Daily   cyanocobalamin  1,000 mcg Oral Daily   enoxaparin (LOVENOX) injection  50 mg Subcutaneous Q24H   feeding supplement  237 mL Oral Q24H   folic acid  1 mg Oral Daily   insulin aspart  0-5 Units Subcutaneous QHS   insulin aspart  0-9 Units Subcutaneous TID WC   iron polysaccharides  150 mg Oral Daily   levothyroxine  75 mcg Oral Q0600   melatonin  2.5 mg Oral QHS   nystatin cream   Topical BID   sodium chloride  1 g Oral BID WC   tamsulosin  0.4 mg Oral QHS   Continuous Infusions:  sodium chloride 100 mL/hr at 04/05/23 0756     LOS: 3 days      Charise Killian, MD [facility] Pager [phone]  If 7PM-7AM, please contact  night-coverage [email or web address]  04/05/2023, 8:56 AM

## 2023-04-06 DIAGNOSIS — E871 Hypo-osmolality and hyponatremia: Secondary | ICD-10-CM | POA: Diagnosis not present

## 2023-04-06 LAB — BASIC METABOLIC PANEL
Anion gap: 7 (ref 5–15)
BUN: 5 mg/dL — ABNORMAL LOW (ref 8–23)
CO2: 23 mmol/L (ref 22–32)
Calcium: 8.7 mg/dL — ABNORMAL LOW (ref 8.9–10.3)
Chloride: 101 mmol/L (ref 98–111)
Creatinine, Ser: 0.48 mg/dL — ABNORMAL LOW (ref 0.61–1.24)
GFR, Estimated: >60 mL/min (ref >60)
Glucose, Bld: 143 mg/dL — ABNORMAL HIGH (ref 70–99)
Potassium: 4.1 mmol/L (ref 3.5–5.1)
Sodium: 131 mmol/L — ABNORMAL LOW (ref 135–145)

## 2023-04-06 LAB — CBC
HCT: 31 % — ABNORMAL LOW (ref 39.0–52.0)
Hemoglobin: 9.9 g/dL — ABNORMAL LOW (ref 13.0–17.0)
MCH: 28.9 pg (ref 26.0–34.0)
MCHC: 31.9 g/dL (ref 30.0–36.0)
MCV: 90.6 fL (ref 80.0–100.0)
Platelets: 402 K/uL — ABNORMAL HIGH (ref 150–400)
RBC: 3.42 MIL/uL — ABNORMAL LOW (ref 4.22–5.81)
RDW: 17.1 % — ABNORMAL HIGH (ref 11.5–15.5)
WBC: 28.9 K/uL — ABNORMAL HIGH (ref 4.0–10.5)
nRBC: 0 % (ref 0.0–0.2)

## 2023-04-06 LAB — GLUCOSE, CAPILLARY
Glucose-Capillary: 146 mg/dL — ABNORMAL HIGH (ref 70–99)
Glucose-Capillary: 232 mg/dL — ABNORMAL HIGH (ref 70–99)
Glucose-Capillary: 167 mg/dL — ABNORMAL HIGH (ref 70–99)
Glucose-Capillary: 145 mg/dL — ABNORMAL HIGH (ref 70–99)

## 2023-04-06 MED ORDER — FUROSEMIDE 20 MG PO TABS
20.0000 mg | ORAL_TABLET | Freq: Every day | ORAL | Status: DC
  Administered 2023-04-06 – 2023-04-07 (×2): 20 mg via ORAL
  Filled 2023-04-06 (×2): qty 1

## 2023-04-06 MED ORDER — FUROSEMIDE 20 MG PO TABS: 20.0000 mg | ORAL_TABLET | Freq: Every day | ORAL | 0 refills | Status: DC

## 2023-04-06 NOTE — Care Management Important Message (Signed)
Important Message  Patient Details  Name: Philip Robbins MRN: [id number] Date of Birth: May 26, 1941   Medicare Important Message Given:  Yes     Olegario Messier A Esta Carmon 04/06/2023, 2:00 PM

## 2023-04-06 NOTE — Plan of Care (Signed)
  Problem: Education: Goal: Knowledge of the prescribed therapeutic regimen will improve Outcome: Progressing Goal: Understanding of activity limitations/precautions following surgery will improve Outcome: Progressing   Problem: Education: Goal: Knowledge of General Education information will improve Description: Including pain rating scale, medication(s)/side effects and non-pharmacologic comfort measures Outcome: Progressing   Problem: Clinical Measurements: Goal: Will remain free from infection Outcome: Progressing

## 2023-04-06 NOTE — Discharge Summary (Addendum)
Physician Discharge Summary  Philip Robbins ZOX:[id number] DOB: April 15, 1941 DOA: 04/02/2023  PCP: Marina Goodell, MD  Admit date: 04/02/2023 Discharge date: 04/07/23  Admitted From: SNF  Disposition: SNF  Recommendations for Outpatient Follow-up:  Follow up with PCP in 1-2 weeks F/u w/ nephro, Dr. Thedore Mins or Dr. Wynelle Link, in 1-2 days to check Na level   Home Health: no  Equipment/Devices:  Discharge Condition: stable  CODE STATUS: full  Diet recommendation: Carb Modified, fluid restriction 2L q24hrs   Brief/Interim Summary: HPI was taken from Dr. Clyde Lundborg: Philip Robbins is a 82 y.o. male with medical history significant of HTN, HLD, DM, dCHF, hypothyroidism, BPH, anemia, CLL, chronic hyponatremia, obesity, recent bilateral shoulder fracture with dislocation (s/p of surgery for both shoulder), who presents with abnormal lab with hyponatremia and diarrhea   Patient was recently hospitalized due to bilateral shoulder fracture with dislocation.  Patient is s/p of bilateral shoulder surgery on 03/17/23, and currently doing rehab.  Surgical sites have been feeling well with mild tenderness.  Patient has intermittent diarrhea in the past 3 days, with 1-2 times of watery diarrhea each day.  No nausea, vomiting or abdominal pain.  No fever or chills.  Patient has mild dry cough, no chest pain or shortness of breath.  No symptoms of UTI.   He had labs done in facility which showed hyponatremia with sodium of 116, and sent to hospital for further evaluation and treatment. Pt is found to have sodium 114 in ED. patient has chronic hyponatremia, recent sodium level  was 122 on 03/30/23.  No confusion or seizure activity.   Data reviewed independently and ED Course: pt was found to have WBC 37.9, GFR> 60, temperature normal, blood pressure 161/88, heart rate 66, RR 20, oxygen saturation 95% on room air.  Patient is admitted to PCU as inpatient.  Dr. Wynelle Link of renal is consulted.    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 130 today. D/c IVFs. D/c NaCl tabs as per nephro. Nephro following and recs apprec    Diarrhea: c.diff & GI PCR panel are both neg. Resolved.    DM2: well controlled, HbA1c 6.7. Restart home anti-DM meds at d/c    HLD: continue on statin    HTN: continue on amlodipine & restart lasix at lower dose, 20mg  daily. IV hydralazine prn    Chronic diastolic CHF: echo on 03/20/2023 showed EF of 60 to 65%, grade I diastolic dysfunction. Has trace leg edema, no shortness of breath, CHF compensated. Restart lasix at a lower dose 20mg . Monitor I/Os   BPH: continue on flomax    Iron deficiency anemia: continue on iron supplement    CLL: not on chemo currently. Likely etiology on anemia, leukocytosis & thrombocytosis. Management per onco outpatient    Hypothyroidism: continue on levothyroxine   Obesity: BMI 33.8. Would benefit from weight loss   Recent hx of b/l shoulder surg: suppose to have staple removal on 04/06/23. Staples will be removed today    Discharge Instructions  Discharge Instructions     Diet Carb Modified   Complete by: As directed  Physician Discharge Summary  Philip Robbins ZOX:[id number] DOB: April 15, 1941 DOA: 04/02/2023  PCP: Marina Goodell, MD  Admit date: 04/02/2023 Discharge date: 04/07/23  Admitted From: SNF  Disposition: SNF  Recommendations for Outpatient Follow-up:  Follow up with PCP in 1-2 weeks F/u w/ nephro, Dr. Thedore Mins or Dr. Wynelle Link, in 1-2 days to check Na level   Home Health: no  Equipment/Devices:  Discharge Condition: stable  CODE STATUS: full  Diet recommendation: Carb Modified, fluid restriction 2L q24hrs   Brief/Interim Summary: HPI was taken from Dr. Clyde Lundborg: Philip Robbins is a 82 y.o. male with medical history significant of HTN, HLD, DM, dCHF, hypothyroidism, BPH, anemia, CLL, chronic hyponatremia, obesity, recent bilateral shoulder fracture with dislocation (s/p of surgery for both shoulder), who presents with abnormal lab with hyponatremia and diarrhea   Patient was recently hospitalized due to bilateral shoulder fracture with dislocation.  Patient is s/p of bilateral shoulder surgery on 03/17/23, and currently doing rehab.  Surgical sites have been feeling well with mild tenderness.  Patient has intermittent diarrhea in the past 3 days, with 1-2 times of watery diarrhea each day.  No nausea, vomiting or abdominal pain.  No fever or chills.  Patient has mild dry cough, no chest pain or shortness of breath.  No symptoms of UTI.   He had labs done in facility which showed hyponatremia with sodium of 116, and sent to hospital for further evaluation and treatment. Pt is found to have sodium 114 in ED. patient has chronic hyponatremia, recent sodium level  was 122 on 03/30/23.  No confusion or seizure activity.   Data reviewed independently and ED Course: pt was found to have WBC 37.9, GFR> 60, temperature normal, blood pressure 161/88, heart rate 66, RR 20, oxygen saturation 95% on room air.  Patient is admitted to PCU as inpatient.  Dr. Wynelle Link of renal is consulted.    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 130 today. D/c IVFs. D/c NaCl tabs as per nephro. Nephro following and recs apprec    Diarrhea: c.diff & GI PCR panel are both neg. Resolved.    DM2: well controlled, HbA1c 6.7. Restart home anti-DM meds at d/c    HLD: continue on statin    HTN: continue on amlodipine & restart lasix at lower dose, 20mg  daily. IV hydralazine prn    Chronic diastolic CHF: echo on 03/20/2023 showed EF of 60 to 65%, grade I diastolic dysfunction. Has trace leg edema, no shortness of breath, CHF compensated. Restart lasix at a lower dose 20mg . Monitor I/Os   BPH: continue on flomax    Iron deficiency anemia: continue on iron supplement    CLL: not on chemo currently. Likely etiology on anemia, leukocytosis & thrombocytosis. Management per onco outpatient    Hypothyroidism: continue on levothyroxine   Obesity: BMI 33.8. Would benefit from weight loss   Recent hx of b/l shoulder surg: suppose to have staple removal on 04/06/23. Staples will be removed today    Discharge Instructions  Discharge Instructions     Diet Carb Modified   Complete by: As directed  Commonly known as: DRISDOL Take 1 capsule (50,000 Units total) by mouth every 7 (seven) days.   ZINC OXIDE EX Apply topically. Apply to buttocks topically every shift for skin        No Known Allergies  Consultations: Nephro    Procedures/Studies: ECHOCARDIOGRAM COMPLETE  Result Date: 03/20/2023    ECHOCARDIOGRAM REPORT   Patient Name:   Philip Robbins Date of Exam: 03/20/2023 Medical Rec #:  [id number]      Height:       67.5 in Accession #:    [id number]     Weight:       230.6 lb Date of Birth:  [birth year]     BSA:          2.160 m Patient Age:    81 years       BP:           167/53 mmHg Patient Gender: M              HR:           97 bpm. Exam Location:  [facility] Procedure: 2D Echo, Cardiac Doppler and Color Doppler Indications:     Dyspnea  History:         Patient has no prior history of Echocardiogram examinations.                  Signs/Symptoms:Dyspnea. Bilateral humeral fractures. Patient                  was not able to lie on his left side. Echo performed with                  patient sitting.  Sonographer:     Mikki Harbor Referring Phys:  [id number] Marcelino Duster Diagnosing Phys: Debbe Odea MD   Sonographer Comments: Technically difficult study due to poor echo windows. IMPRESSIONS  1. Left ventricular ejection fraction, by estimation, is 60 to 65%. The left ventricle has normal function. The left ventricle has no regional wall motion abnormalities. Left ventricular diastolic parameters are consistent with Grade I diastolic dysfunction (impaired relaxation).  2. Right ventricular systolic function is normal. The right ventricular size is mildly enlarged. There is normal pulmonary artery systolic pressure.  3. The mitral valve is normal in structure. Mild mitral valve regurgitation.  4. The aortic valve is calcified. Aortic valve regurgitation is not visualized. Aortic valve sclerosis/calcification is present, without any evidence of aortic stenosis.  5. The inferior vena cava is normal in size with greater than 50% respiratory variability, suggesting right atrial pressure of 3 mmHg. FINDINGS  Left Ventricle: Left ventricular ejection fraction, by estimation, is 60 to 65%. The left ventricle has normal function. The left ventricle has no regional wall motion abnormalities. The left ventricular internal cavity size was normal in size. There is  no left ventricular hypertrophy. Left ventricular diastolic parameters are consistent with Grade I diastolic dysfunction (impaired relaxation). Right Ventricle: The right ventricular size is mildly enlarged. No increase in right ventricular wall thickness. Right ventricular systolic function is normal. There is normal pulmonary artery systolic pressure. The tricuspid regurgitant velocity is 1.56  m/s, and with an assumed right atrial pressure of 3 mmHg, the estimated right ventricular systolic pressure is 12.7 mmHg. Left Atrium: Left atrial size was normal in size. Right Atrium: Right atrial size was normal in size. Pericardium: There is no evidence of pericardial effusion. Mitral Valve: The mitral valve is normal in structure. Mild mitral valve regurgitation.  Physician Discharge Summary  Philip Robbins ZOX:[id number] DOB: April 15, 1941 DOA: 04/02/2023  PCP: Marina Goodell, MD  Admit date: 04/02/2023 Discharge date: 04/07/23  Admitted From: SNF  Disposition: SNF  Recommendations for Outpatient Follow-up:  Follow up with PCP in 1-2 weeks F/u w/ nephro, Dr. Thedore Mins or Dr. Wynelle Link, in 1-2 days to check Na level   Home Health: no  Equipment/Devices:  Discharge Condition: stable  CODE STATUS: full  Diet recommendation: Carb Modified, fluid restriction 2L q24hrs   Brief/Interim Summary: HPI was taken from Dr. Clyde Lundborg: Philip Robbins is a 82 y.o. male with medical history significant of HTN, HLD, DM, dCHF, hypothyroidism, BPH, anemia, CLL, chronic hyponatremia, obesity, recent bilateral shoulder fracture with dislocation (s/p of surgery for both shoulder), who presents with abnormal lab with hyponatremia and diarrhea   Patient was recently hospitalized due to bilateral shoulder fracture with dislocation.  Patient is s/p of bilateral shoulder surgery on 03/17/23, and currently doing rehab.  Surgical sites have been feeling well with mild tenderness.  Patient has intermittent diarrhea in the past 3 days, with 1-2 times of watery diarrhea each day.  No nausea, vomiting or abdominal pain.  No fever or chills.  Patient has mild dry cough, no chest pain or shortness of breath.  No symptoms of UTI.   He had labs done in facility which showed hyponatremia with sodium of 116, and sent to hospital for further evaluation and treatment. Pt is found to have sodium 114 in ED. patient has chronic hyponatremia, recent sodium level  was 122 on 03/30/23.  No confusion or seizure activity.   Data reviewed independently and ED Course: pt was found to have WBC 37.9, GFR> 60, temperature normal, blood pressure 161/88, heart rate 66, RR 20, oxygen saturation 95% on room air.  Patient is admitted to PCU as inpatient.  Dr. Wynelle Link of renal is consulted.    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 130 today. D/c IVFs. D/c NaCl tabs as per nephro. Nephro following and recs apprec    Diarrhea: c.diff & GI PCR panel are both neg. Resolved.    DM2: well controlled, HbA1c 6.7. Restart home anti-DM meds at d/c    HLD: continue on statin    HTN: continue on amlodipine & restart lasix at lower dose, 20mg  daily. IV hydralazine prn    Chronic diastolic CHF: echo on 03/20/2023 showed EF of 60 to 65%, grade I diastolic dysfunction. Has trace leg edema, no shortness of breath, CHF compensated. Restart lasix at a lower dose 20mg . Monitor I/Os   BPH: continue on flomax    Iron deficiency anemia: continue on iron supplement    CLL: not on chemo currently. Likely etiology on anemia, leukocytosis & thrombocytosis. Management per onco outpatient    Hypothyroidism: continue on levothyroxine   Obesity: BMI 33.8. Would benefit from weight loss   Recent hx of b/l shoulder surg: suppose to have staple removal on 04/06/23. Staples will be removed today    Discharge Instructions  Discharge Instructions     Diet Carb Modified   Complete by: As directed  Physician Discharge Summary  Philip Robbins ZOX:[id number] DOB: April 15, 1941 DOA: 04/02/2023  PCP: Marina Goodell, MD  Admit date: 04/02/2023 Discharge date: 04/07/23  Admitted From: SNF  Disposition: SNF  Recommendations for Outpatient Follow-up:  Follow up with PCP in 1-2 weeks F/u w/ nephro, Dr. Thedore Mins or Dr. Wynelle Link, in 1-2 days to check Na level   Home Health: no  Equipment/Devices:  Discharge Condition: stable  CODE STATUS: full  Diet recommendation: Carb Modified, fluid restriction 2L q24hrs   Brief/Interim Summary: HPI was taken from Dr. Clyde Lundborg: Philip Robbins is a 82 y.o. male with medical history significant of HTN, HLD, DM, dCHF, hypothyroidism, BPH, anemia, CLL, chronic hyponatremia, obesity, recent bilateral shoulder fracture with dislocation (s/p of surgery for both shoulder), who presents with abnormal lab with hyponatremia and diarrhea   Patient was recently hospitalized due to bilateral shoulder fracture with dislocation.  Patient is s/p of bilateral shoulder surgery on 03/17/23, and currently doing rehab.  Surgical sites have been feeling well with mild tenderness.  Patient has intermittent diarrhea in the past 3 days, with 1-2 times of watery diarrhea each day.  No nausea, vomiting or abdominal pain.  No fever or chills.  Patient has mild dry cough, no chest pain or shortness of breath.  No symptoms of UTI.   He had labs done in facility which showed hyponatremia with sodium of 116, and sent to hospital for further evaluation and treatment. Pt is found to have sodium 114 in ED. patient has chronic hyponatremia, recent sodium level  was 122 on 03/30/23.  No confusion or seizure activity.   Data reviewed independently and ED Course: pt was found to have WBC 37.9, GFR> 60, temperature normal, blood pressure 161/88, heart rate 66, RR 20, oxygen saturation 95% on room air.  Patient is admitted to PCU as inpatient.  Dr. Wynelle Link of renal is consulted.    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 130 today. D/c IVFs. D/c NaCl tabs as per nephro. Nephro following and recs apprec    Diarrhea: c.diff & GI PCR panel are both neg. Resolved.    DM2: well controlled, HbA1c 6.7. Restart home anti-DM meds at d/c    HLD: continue on statin    HTN: continue on amlodipine & restart lasix at lower dose, 20mg  daily. IV hydralazine prn    Chronic diastolic CHF: echo on 03/20/2023 showed EF of 60 to 65%, grade I diastolic dysfunction. Has trace leg edema, no shortness of breath, CHF compensated. Restart lasix at a lower dose 20mg . Monitor I/Os   BPH: continue on flomax    Iron deficiency anemia: continue on iron supplement    CLL: not on chemo currently. Likely etiology on anemia, leukocytosis & thrombocytosis. Management per onco outpatient    Hypothyroidism: continue on levothyroxine   Obesity: BMI 33.8. Would benefit from weight loss   Recent hx of b/l shoulder surg: suppose to have staple removal on 04/06/23. Staples will be removed today    Discharge Instructions  Discharge Instructions     Diet Carb Modified   Complete by: As directed  Commonly known as: DRISDOL Take 1 capsule (50,000 Units total) by mouth every 7 (seven) days.   ZINC OXIDE EX Apply topically. Apply to buttocks topically every shift for skin        No Known Allergies  Consultations: Nephro    Procedures/Studies: ECHOCARDIOGRAM COMPLETE  Result Date: 03/20/2023    ECHOCARDIOGRAM REPORT   Patient Name:   Philip Robbins Date of Exam: 03/20/2023 Medical Rec #:  [id number]      Height:       67.5 in Accession #:    [id number]     Weight:       230.6 lb Date of Birth:  [birth year]     BSA:          2.160 m Patient Age:    81 years       BP:           167/53 mmHg Patient Gender: M              HR:           97 bpm. Exam Location:  [facility] Procedure: 2D Echo, Cardiac Doppler and Color Doppler Indications:     Dyspnea  History:         Patient has no prior history of Echocardiogram examinations.                  Signs/Symptoms:Dyspnea. Bilateral humeral fractures. Patient                  was not able to lie on his left side. Echo performed with                  patient sitting.  Sonographer:     Mikki Harbor Referring Phys:  [id number] Marcelino Duster Diagnosing Phys: Debbe Odea MD   Sonographer Comments: Technically difficult study due to poor echo windows. IMPRESSIONS  1. Left ventricular ejection fraction, by estimation, is 60 to 65%. The left ventricle has normal function. The left ventricle has no regional wall motion abnormalities. Left ventricular diastolic parameters are consistent with Grade I diastolic dysfunction (impaired relaxation).  2. Right ventricular systolic function is normal. The right ventricular size is mildly enlarged. There is normal pulmonary artery systolic pressure.  3. The mitral valve is normal in structure. Mild mitral valve regurgitation.  4. The aortic valve is calcified. Aortic valve regurgitation is not visualized. Aortic valve sclerosis/calcification is present, without any evidence of aortic stenosis.  5. The inferior vena cava is normal in size with greater than 50% respiratory variability, suggesting right atrial pressure of 3 mmHg. FINDINGS  Left Ventricle: Left ventricular ejection fraction, by estimation, is 60 to 65%. The left ventricle has normal function. The left ventricle has no regional wall motion abnormalities. The left ventricular internal cavity size was normal in size. There is  no left ventricular hypertrophy. Left ventricular diastolic parameters are consistent with Grade I diastolic dysfunction (impaired relaxation). Right Ventricle: The right ventricular size is mildly enlarged. No increase in right ventricular wall thickness. Right ventricular systolic function is normal. There is normal pulmonary artery systolic pressure. The tricuspid regurgitant velocity is 1.56  m/s, and with an assumed right atrial pressure of 3 mmHg, the estimated right ventricular systolic pressure is 12.7 mmHg. Left Atrium: Left atrial size was normal in size. Right Atrium: Right atrial size was normal in size. Pericardium: There is no evidence of pericardial effusion. Mitral Valve: The mitral valve is normal in structure. Mild mitral valve regurgitation.  Physician Discharge Summary  Philip Robbins ZOX:[id number] DOB: April 15, 1941 DOA: 04/02/2023  PCP: Marina Goodell, MD  Admit date: 04/02/2023 Discharge date: 04/07/23  Admitted From: SNF  Disposition: SNF  Recommendations for Outpatient Follow-up:  Follow up with PCP in 1-2 weeks F/u w/ nephro, Dr. Thedore Mins or Dr. Wynelle Link, in 1-2 days to check Na level   Home Health: no  Equipment/Devices:  Discharge Condition: stable  CODE STATUS: full  Diet recommendation: Carb Modified, fluid restriction 2L q24hrs   Brief/Interim Summary: HPI was taken from Dr. Clyde Lundborg: Philip Robbins is a 82 y.o. male with medical history significant of HTN, HLD, DM, dCHF, hypothyroidism, BPH, anemia, CLL, chronic hyponatremia, obesity, recent bilateral shoulder fracture with dislocation (s/p of surgery for both shoulder), who presents with abnormal lab with hyponatremia and diarrhea   Patient was recently hospitalized due to bilateral shoulder fracture with dislocation.  Patient is s/p of bilateral shoulder surgery on 03/17/23, and currently doing rehab.  Surgical sites have been feeling well with mild tenderness.  Patient has intermittent diarrhea in the past 3 days, with 1-2 times of watery diarrhea each day.  No nausea, vomiting or abdominal pain.  No fever or chills.  Patient has mild dry cough, no chest pain or shortness of breath.  No symptoms of UTI.   He had labs done in facility which showed hyponatremia with sodium of 116, and sent to hospital for further evaluation and treatment. Pt is found to have sodium 114 in ED. patient has chronic hyponatremia, recent sodium level  was 122 on 03/30/23.  No confusion or seizure activity.   Data reviewed independently and ED Course: pt was found to have WBC 37.9, GFR> 60, temperature normal, blood pressure 161/88, heart rate 66, RR 20, oxygen saturation 95% on room air.  Patient is admitted to PCU as inpatient.  Dr. Wynelle Link of renal is consulted.    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 130 today. D/c IVFs. D/c NaCl tabs as per nephro. Nephro following and recs apprec    Diarrhea: c.diff & GI PCR panel are both neg. Resolved.    DM2: well controlled, HbA1c 6.7. Restart home anti-DM meds at d/c    HLD: continue on statin    HTN: continue on amlodipine & restart lasix at lower dose, 20mg  daily. IV hydralazine prn    Chronic diastolic CHF: echo on 03/20/2023 showed EF of 60 to 65%, grade I diastolic dysfunction. Has trace leg edema, no shortness of breath, CHF compensated. Restart lasix at a lower dose 20mg . Monitor I/Os   BPH: continue on flomax    Iron deficiency anemia: continue on iron supplement    CLL: not on chemo currently. Likely etiology on anemia, leukocytosis & thrombocytosis. Management per onco outpatient    Hypothyroidism: continue on levothyroxine   Obesity: BMI 33.8. Would benefit from weight loss   Recent hx of b/l shoulder surg: suppose to have staple removal on 04/06/23. Staples will be removed today    Discharge Instructions  Discharge Instructions     Diet Carb Modified   Complete by: As directed  Commonly known as: DRISDOL Take 1 capsule (50,000 Units total) by mouth every 7 (seven) days.   ZINC OXIDE EX Apply topically. Apply to buttocks topically every shift for skin        No Known Allergies  Consultations: Nephro    Procedures/Studies: ECHOCARDIOGRAM COMPLETE  Result Date: 03/20/2023    ECHOCARDIOGRAM REPORT   Patient Name:   Philip Robbins Date of Exam: 03/20/2023 Medical Rec #:  [id number]      Height:       67.5 in Accession #:    [id number]     Weight:       230.6 lb Date of Birth:  [birth year]     BSA:          2.160 m Patient Age:    81 years       BP:           167/53 mmHg Patient Gender: M              HR:           97 bpm. Exam Location:  [facility] Procedure: 2D Echo, Cardiac Doppler and Color Doppler Indications:     Dyspnea  History:         Patient has no prior history of Echocardiogram examinations.                  Signs/Symptoms:Dyspnea. Bilateral humeral fractures. Patient                  was not able to lie on his left side. Echo performed with                  patient sitting.  Sonographer:     Mikki Harbor Referring Phys:  [id number] Marcelino Duster Diagnosing Phys: Debbe Odea MD   Sonographer Comments: Technically difficult study due to poor echo windows. IMPRESSIONS  1. Left ventricular ejection fraction, by estimation, is 60 to 65%. The left ventricle has normal function. The left ventricle has no regional wall motion abnormalities. Left ventricular diastolic parameters are consistent with Grade I diastolic dysfunction (impaired relaxation).  2. Right ventricular systolic function is normal. The right ventricular size is mildly enlarged. There is normal pulmonary artery systolic pressure.  3. The mitral valve is normal in structure. Mild mitral valve regurgitation.  4. The aortic valve is calcified. Aortic valve regurgitation is not visualized. Aortic valve sclerosis/calcification is present, without any evidence of aortic stenosis.  5. The inferior vena cava is normal in size with greater than 50% respiratory variability, suggesting right atrial pressure of 3 mmHg. FINDINGS  Left Ventricle: Left ventricular ejection fraction, by estimation, is 60 to 65%. The left ventricle has normal function. The left ventricle has no regional wall motion abnormalities. The left ventricular internal cavity size was normal in size. There is  no left ventricular hypertrophy. Left ventricular diastolic parameters are consistent with Grade I diastolic dysfunction (impaired relaxation). Right Ventricle: The right ventricular size is mildly enlarged. No increase in right ventricular wall thickness. Right ventricular systolic function is normal. There is normal pulmonary artery systolic pressure. The tricuspid regurgitant velocity is 1.56  m/s, and with an assumed right atrial pressure of 3 mmHg, the estimated right ventricular systolic pressure is 12.7 mmHg. Left Atrium: Left atrial size was normal in size. Right Atrium: Right atrial size was normal in size. Pericardium: There is no evidence of pericardial effusion. Mitral Valve: The mitral valve is normal in structure. Mild mitral valve regurgitation.  Physician Discharge Summary  Philip Robbins ZOX:[id number] DOB: April 15, 1941 DOA: 04/02/2023  PCP: Marina Goodell, MD  Admit date: 04/02/2023 Discharge date: 04/07/23  Admitted From: SNF  Disposition: SNF  Recommendations for Outpatient Follow-up:  Follow up with PCP in 1-2 weeks F/u w/ nephro, Dr. Thedore Mins or Dr. Wynelle Link, in 1-2 days to check Na level   Home Health: no  Equipment/Devices:  Discharge Condition: stable  CODE STATUS: full  Diet recommendation: Carb Modified, fluid restriction 2L q24hrs   Brief/Interim Summary: HPI was taken from Dr. Clyde Lundborg: Philip Robbins is a 82 y.o. male with medical history significant of HTN, HLD, DM, dCHF, hypothyroidism, BPH, anemia, CLL, chronic hyponatremia, obesity, recent bilateral shoulder fracture with dislocation (s/p of surgery for both shoulder), who presents with abnormal lab with hyponatremia and diarrhea   Patient was recently hospitalized due to bilateral shoulder fracture with dislocation.  Patient is s/p of bilateral shoulder surgery on 03/17/23, and currently doing rehab.  Surgical sites have been feeling well with mild tenderness.  Patient has intermittent diarrhea in the past 3 days, with 1-2 times of watery diarrhea each day.  No nausea, vomiting or abdominal pain.  No fever or chills.  Patient has mild dry cough, no chest pain or shortness of breath.  No symptoms of UTI.   He had labs done in facility which showed hyponatremia with sodium of 116, and sent to hospital for further evaluation and treatment. Pt is found to have sodium 114 in ED. patient has chronic hyponatremia, recent sodium level  was 122 on 03/30/23.  No confusion or seizure activity.   Data reviewed independently and ED Course: pt was found to have WBC 37.9, GFR> 60, temperature normal, blood pressure 161/88, heart rate 66, RR 20, oxygen saturation 95% on room air.  Patient is admitted to PCU as inpatient.  Dr. Wynelle Link of renal is consulted.    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 130 today. D/c IVFs. D/c NaCl tabs as per nephro. Nephro following and recs apprec    Diarrhea: c.diff & GI PCR panel are both neg. Resolved.    DM2: well controlled, HbA1c 6.7. Restart home anti-DM meds at d/c    HLD: continue on statin    HTN: continue on amlodipine & restart lasix at lower dose, 20mg  daily. IV hydralazine prn    Chronic diastolic CHF: echo on 03/20/2023 showed EF of 60 to 65%, grade I diastolic dysfunction. Has trace leg edema, no shortness of breath, CHF compensated. Restart lasix at a lower dose 20mg . Monitor I/Os   BPH: continue on flomax    Iron deficiency anemia: continue on iron supplement    CLL: not on chemo currently. Likely etiology on anemia, leukocytosis & thrombocytosis. Management per onco outpatient    Hypothyroidism: continue on levothyroxine   Obesity: BMI 33.8. Would benefit from weight loss   Recent hx of b/l shoulder surg: suppose to have staple removal on 04/06/23. Staples will be removed today    Discharge Instructions  Discharge Instructions     Diet Carb Modified   Complete by: As directed

## 2023-04-06 NOTE — [place] FL2 (Signed)
[place] MEDICAID FL2 LEVEL OF CARE FORM     IDENTIFICATION  Patient Name: Philip Robbins Birthdate: Jan 05, 1941 Sex: male Admission Date (Current Location): 04/02/2023  [facility] and [place] Number:  Chiropodist and Address:  [facility], [address] [place], [place] [address]      Provider Number: [id number]  Attending Physician Name and Address:  Charise Killian, MD  Relative Name and Phone Number:  Gay Filler [phone]    Current Level of Care: Hospital Recommended Level of Care: Skilled Nursing Facility Prior Approval Number:    Date Approved/Denied:   PASRR Number: [id number]  Discharge Plan: SNF    Current Diagnoses: Patient Active Problem List   Diagnosis Date Noted   Hypothyroidism 04/03/2023   Chronic diastolic CHF (congestive heart failure) (HCC) 04/02/2023   Obesity (BMI 30-39.9) 04/02/2023   Iron deficiency anemia 04/02/2023   Diarrhea 04/02/2023   Type 2 diabetes mellitus without complication, without long-term current use of insulin (HCC) 03/23/2023   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) 03/23/2023   Acute metabolic encephalopathy 03/20/2023   Bilateral humeral fractures 03/18/2023   Acute hypoxic respiratory failure (HCC) 03/18/2023   Hypophosphatemia 03/18/2023   Hypomagnesemia 03/18/2023   CAP (community acquired pneumonia) 03/08/2023   Pneumonia 03/08/2023   Sepsis secondary to UTI (HCC) 03/08/2023   Hyponatremia 03/08/2023   BPH (benign prostatic hyperplasia) 12/21/2022   Irritable bowel syndrome without diarrhea 12/21/2022   Hypertension 12/21/2022   Bilateral hearing loss 01/23/2021   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/2017   Hyperlipidemia 01/17/2016   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   Lymphocytosis 05/04/2015   Status post arthroscopic surgery of right knee 04/10/2015   Tear of meniscus of knee 03/08/2015   Seasonal allergic rhinitis 02/17/2014   Herpes zoster ophthalmicus  11/14/2013    Orientation RESPIRATION BLADDER Height & Weight     Self, Time, Situation, Place  O2 External catheter Weight: 98.2 kg Height:     BEHAVIORAL SYMPTOMS/MOOD NEUROLOGICAL BOWEL NUTRITION STATUS      Continent Diet  AMBULATORY STATUS COMMUNICATION OF NEEDS Skin   Limited Assist Verbally Surgical wounds (L shoulder)                       Personal Care Assistance Level of Assistance  Bathing, Feeding, Dressing Bathing Assistance: Maximum assistance Feeding assistance: Limited assistance Dressing Assistance: Maximum assistance     Functional Limitations Info  Sight, Hearing, Speech Sight Info: Adequate Hearing Info: Adequate Speech Info: Adequate    SPECIAL CARE FACTORS FREQUENCY  PT (By licensed PT), OT (By licensed OT)     PT Frequency: 5 times per week OT Frequency: 5 times per week            Contractures Contractures Info: Not present    Additional Factors Info  Code Status, Allergies Code Status Info: Full code Allergies Info: NKDA           Current Medications (04/06/2023):  This is the current hospital active medication list Current Facility-Administered Medications  Medication Dose Route Frequency Provider Last Rate Last Admin   acetaminophen (TYLENOL) tablet 650 mg  650 mg Oral Q6H PRN Lorretta Harp, MD   650 mg at 04/05/23 2132   albuterol (PROVENTIL) (2.5 MG/3ML) 0.083% nebulizer solution 2.5 mg  2.5 mg Inhalation Q4H PRN Lorretta Harp, MD       amLODipine (NORVASC) tablet 5 mg  5 mg Oral Daily Lorretta Harp, MD   5 mg  at 04/06/23 0904   atorvastatin (LIPITOR) tablet 40 mg  40 mg Oral Daily Lorretta Harp, MD   40 mg at 04/05/23 2145   cyanocobalamin (VITAMIN B12) tablet 1,000 mcg  1,000 mcg Oral Daily Lorretta Harp, MD   1,000 mcg at 04/06/23 0904   enoxaparin (LOVENOX) injection 50 mg  50 mg Subcutaneous Q24H Lorretta Harp, MD   50 mg at 04/05/23 2133   feeding supplement (ENSURE ENLIVE / ENSURE PLUS) liquid 237 mL  237 mL Oral Q24H [name],  NP   237 mL at 04/06/23 1028   folic acid (FOLVITE) tablet 1 mg  1 mg Oral Daily Lorretta Harp, MD   1 mg at 04/06/23 1610   furosemide (LASIX) tablet 20 mg  20 mg Oral Daily [name], NP   20 mg at 04/06/23 1305   hydrALAZINE (APRESOLINE) injection 5 mg  5 mg Intravenous Q2H PRN Lorretta Harp, MD       insulin aspart (novoLOG) injection 0-5 Units  0-5 Units Subcutaneous QHS Lorretta Harp, MD       insulin aspart (novoLOG) injection 0-9 Units  0-9 Units Subcutaneous TID WC Lorretta Harp, MD   3 Units at 04/06/23 1301   iron polysaccharides (NIFEREX) capsule 150 mg  150 mg Oral Daily Lorretta Harp, MD   150 mg at 04/06/23 9604   levothyroxine (SYNTHROID) tablet 75 mcg  75 mcg Oral Q0600 Lorretta Harp, MD   75 mcg at 04/06/23 5409   melatonin tablet 2.5 mg  2.5 mg Oral QHS Lorretta Harp, MD   2.5 mg at 04/05/23 2132   nystatin cream (MYCOSTATIN)   Topical BID Charise Killian, MD   Given at 04/06/23 0908   ondansetron [facility]) injection 4 mg  4 mg Intravenous Q8H PRN Lorretta Harp, MD       oxyCODONE (Oxy IR/ROXICODONE) immediate release tablet 5 mg  5 mg Oral Q6H PRN Lorretta Harp, MD   5 mg at 04/05/23 2132   sodium chloride (OCEAN) 0.65 % nasal Ventola 2 Cue  2 [place] Each Nare PRN Lorretta Harp, MD       tamsulosin (FLOMAX) capsule 0.4 mg  0.4 mg Oral QHS Lorretta Harp, MD   0.4 mg at 04/05/23 2132     Discharge Medications: Please see discharge summary for a list of discharge medications.  Relevant Imaging Results:  Relevant Lab Results:   Additional Information SS #: [id number]  Jazma Pickel Seward Carol, RN

## 2023-04-06 NOTE — Progress Notes (Addendum)
[facility]  ROUNDING NOTE   Subjective:   NEVAEH HOLLYFIELD is a 82 y.o. male with past medical history of CLL, BPH, hypertension, diabetes mellitus type II, hypothyroidism, and hyperlipidemia. Patient presents to ED with abnormal labs and has been admitted for Hyponatremia [E87.1] Diarrhea, unspecified type [R19.7]  Patient is known to our practice from previous admission.   Patient laying in bed Wife at bedside Awaiting breakfast No complaints to offer  Bilateral arm slings in place  Sodium 131  Objective:  Vital signs in last 24 hours:  Temp:  [98.2 F (36.8 C)-98.6 F (37 C)] 98.2 F (36.8 C) (09/23 0801) Pulse Rate:  [72-83] 77 (09/23 0801) Resp:  [16-18] 16 (09/23 0801) BP: (145-174)/(59-81) 151/59 (09/23 0801) SpO2:  [95 %-98 %] 98 % (09/23 0801) Weight:  [98.2 kg] 98.2 kg (09/23 0500)  Weight change: -1.8 kg Filed Weights   04/02/23 1705 04/05/23 0500 04/06/23 0500  Weight: 99.6 kg 100 kg 98.2 kg    Intake/Output: I/O last 3 completed shifts: In: 520 [P.O.:520] Out: 1250 [Urine:1250]   Intake/Output this shift:  Total I/O In: 120 [P.O.:120] Out: 900 [Urine:900]  Physical Exam: General: NAD, laying in bed  Head: Normocephalic, atraumatic. Moist oral mucosal membranes  Eyes: Anicteric  Lungs:  Clear to auscultation, normal effort  Heart: Regular rate and rhythm  Abdomen:  Soft, nontender  Extremities: 1+ peripheral edema.  BUE slings  Neurologic: Alert, moving all four extremities  Skin: No lesions  Access: None    Basic Metabolic Panel: Recent Labs  Lab 04/02/23 2049 04/03/23 0403 04/03/23 1559 04/03/23 1946 04/04/23 0400 04/05/23 0343 04/06/23 0631  NA 116*   < > 123* 125* 127* 130* 131*  K 3.8   < > 3.8 3.8 4.0 3.8 4.1  CL 86*   < > 92* 91* 98 101 101  CO2 20*   < > 22 22 23 23 23   GLUCOSE 125*   < > 163* 147* 113* 148* 143*  BUN 7*   < > 5* 7* 5* 7* 5*  CREATININE 0.57*   < > 0.64 0.74 0.54* 0.49* 0.48*  CALCIUM 8.1*    < > 8.1* 8.4* 8.1* 7.9* 8.7*  MG 1.5*  --   --   --   --   --   --   PHOS 2.6  --   --   --   --   --   --    < > = values in this interval not displayed.    Liver Function Tests: Recent Labs  Lab 04/02/23 1707  AST 29  ALT 22  ALKPHOS 82  BILITOT 0.9  PROT 6.3*  ALBUMIN 3.1*   No results for input(s): "LIPASE", "AMYLASE" in the last 168 hours. No results for input(s): "AMMONIA" in the last 168 hours.  CBC: Recent Labs  Lab 04/02/23 1707 04/03/23 1106 04/04/23 0400 04/05/23 0343 04/06/23 0631  WBC 37.9* 34.0* 32.4* 30.5* 28.9*  NEUTROABS 8.7*  --   --   --   --   HGB 10.2* 9.3* 9.7* 8.9* 9.9*  HCT 32.3* 28.2* 28.4* 26.9* 31.0*  MCV 91.8 87.3 85.5 87.6 90.6  PLT 488* 463* 443* 405* 402*    Cardiac Enzymes: No results for input(s): "CKTOTAL", "CKMB", "CKMBINDEX", "TROPONINI" in the last 168 hours.  BNP: Invalid input(s): "POCBNP"  CBG: Recent Labs  Lab 04/05/23 0800 04/05/23 1136 04/05/23 1755 04/05/23 2203 04/06/23 0802  GLUCAP 145* 247* 184* 178* 146*  Microbiology: Results for orders placed or performed during the hospital encounter of 04/02/23  Gastrointestinal Panel by PCR , Stool     Status: None   Collection Time: 04/04/23  8:00 AM   Specimen: Stool  Result Value Ref Range Status   Campylobacter species NOT DETECTED NOT DETECTED Final   Plesimonas shigelloides NOT DETECTED NOT DETECTED Final   Salmonella species NOT DETECTED NOT DETECTED Final   Yersinia enterocolitica NOT DETECTED NOT DETECTED Final   Vibrio species NOT DETECTED NOT DETECTED Final   Vibrio cholerae NOT DETECTED NOT DETECTED Final   Enteroaggregative E coli (EAEC) NOT DETECTED NOT DETECTED Final   Enteropathogenic E coli (EPEC) NOT DETECTED NOT DETECTED Final   Enterotoxigenic E coli (ETEC) NOT DETECTED NOT DETECTED Final   Shiga like toxin producing E coli (STEC) NOT DETECTED NOT DETECTED Final   Shigella/Enteroinvasive E coli (EIEC) NOT DETECTED NOT DETECTED Final    Cryptosporidium NOT DETECTED NOT DETECTED Final   Cyclospora cayetanensis NOT DETECTED NOT DETECTED Final   Entamoeba histolytica NOT DETECTED NOT DETECTED Final   Giardia lamblia NOT DETECTED NOT DETECTED Final   Adenovirus F40/41 NOT DETECTED NOT DETECTED Final   Astrovirus NOT DETECTED NOT DETECTED Final   Norovirus GI/GII NOT DETECTED NOT DETECTED Final   Rotavirus A NOT DETECTED NOT DETECTED Final   Sapovirus (I, II, IV, and V) NOT DETECTED NOT DETECTED Final    Comment: Performed at [facility], [address] [place], [place] [address]  C Difficile Quick Screen w PCR reflex     Status: None   Collection Time: 04/04/23  8:00 AM   Specimen: STOOL  Result Value Ref Range Status   C Diff antigen NEGATIVE NEGATIVE Final   C Diff toxin NEGATIVE NEGATIVE Final   C Diff interpretation No C. difficile detected.  Final    Comment: Performed at [facility], [address] [place], [place] [address]    Coagulation Studies: No results for input(s): "LABPROT", "INR" in the last 72 hours.  Urinalysis: No results for input(s): "COLORURINE", "LABSPEC", "PHURINE", "GLUCOSEU", "HGBUR", "BILIRUBINUR", "KETONESUR", "PROTEINUR", "UROBILINOGEN", "NITRITE", "LEUKOCYTESUR" in the last 72 hours.  Invalid input(s): "APPERANCEUR"     Imaging: No results found.   Medications:      amLODipine  5 mg Oral Daily   atorvastatin  40 mg Oral Daily   cyanocobalamin  1,000 mcg Oral Daily   enoxaparin (LOVENOX) injection  50 mg Subcutaneous Q24H   feeding supplement  237 mL Oral Q24H   folic acid  1 mg Oral Daily   insulin aspart  0-5 Units Subcutaneous QHS   insulin aspart  0-9 Units Subcutaneous TID WC   iron polysaccharides  150 mg Oral Daily   levothyroxine  75 mcg Oral Q0600   melatonin  2.5 mg Oral QHS   nystatin cream   Topical BID   sodium chloride  1 g Oral BID WC   tamsulosin  0.4 mg Oral QHS   acetaminophen, albuterol, hydrALAZINE, ondansetron (ZOFRAN) IV,  oxyCODONE, sodium chloride  Assessment/ Plan:  Mr. BRANDONJAMES TANGO is a 82 y.o.  male with past medical history of CLL, BPH, hypertension, diabetes mellitus type II, hypothyroidism, and hyperlipidemia. Patient presents to ED with abnormal labs and has been admitted for Hyponatremia [E87.1] Diarrhea, unspecified type [R19.7]   Hyponatremia likely secondary to ? Severe diarrhea prior to admission. Endorses persistent poor oral intake. Sodium 114 on admission.  ` -Sodium 131  -Continue Ensure supplementation.  -Oral intake  has improved  - Will order oral furosemide 20mg  daily  - Stop salt tabs  - Will schedule follow-up appointment in our office.     LOS: 4 Rakeem Colley 9/23/202411:58 AM

## 2023-04-06 NOTE — Progress Notes (Signed)
Philip Robbins is about 3 weeks postop from bilateral reverse total shoulder arthroplasties performed on 03/13/2023 and 03/17/2023 by Dr. Allena Katz.  He was originally supposed to have a follow-up with Dedra Skeens today (04/06/2023)  to have his staples removed. Given that he was found to be hyponatremic and admitted to the hospital he was not able to make this appointment.  His staples were removed today and Steri-Strips were applied while in the hospital.  The incision lines look well-approximated without any surrounding swelling, erythema or drainage appreciated.  He is neurovascularly intact to all dermatomes down his bilateral upper extremities.  He states that he has been wearing his sling's appropriately and prior to this admission was doing very well with physical therapy.  Will look to transition to a rehab facility at [facility] tomorrow upon discharge.  He will still plan to follow-up with [facility] clinic orthopedics in the coming weeks for postoperative evaluation.  Danise Edge, PA-C [facility]

## 2023-04-07 LAB — CBC
HCT: 30 % — ABNORMAL LOW (ref 39.0–52.0)
Hemoglobin: 9.8 g/dL — ABNORMAL LOW (ref 13.0–17.0)
MCH: 28.8 pg (ref 26.0–34.0)
MCHC: 32.7 g/dL (ref 30.0–36.0)
MCV: 88.2 fL (ref 80.0–100.0)
Platelets: 347 10*3/uL (ref 150–400)
RBC: 3.4 MIL/uL — ABNORMAL LOW (ref 4.22–5.81)
RDW: 17.1 % — ABNORMAL HIGH (ref 11.5–15.5)
WBC: 26.8 10*3/uL — ABNORMAL HIGH (ref 4.0–10.5)
nRBC: 0 % (ref 0.0–0.2)

## 2023-04-07 LAB — BASIC METABOLIC PANEL
Anion gap: 8 (ref 5–15)
BUN: <5 mg/dL — ABNORMAL LOW (ref 8–23)
CO2: 23 mmol/L (ref 22–32)
Calcium: 8.8 mg/dL — ABNORMAL LOW (ref 8.9–10.3)
Chloride: 99 mmol/L (ref 98–111)
Creatinine, Ser: 0.55 mg/dL — ABNORMAL LOW (ref 0.61–1.24)
GFR, Estimated: >60 mL/min (ref >60)
Glucose, Bld: 134 mg/dL — ABNORMAL HIGH (ref 70–99)
Potassium: 4.4 mmol/L (ref 3.5–5.1)
Sodium: 130 mmol/L — ABNORMAL LOW (ref 135–145)

## 2023-04-07 LAB — GLUCOSE, CAPILLARY
Glucose-Capillary: 144 mg/dL — ABNORMAL HIGH (ref 70–99)
Glucose-Capillary: 204 mg/dL — ABNORMAL HIGH (ref 70–99)

## 2023-04-07 NOTE — Plan of Care (Signed)
  Problem: Activity: Goal: Ability to tolerate increased activity will improve Outcome: Progressing   

## 2023-04-07 NOTE — TOC Progression Note (Signed)
Transition of Care [facility] - Progression Note    Patient Details  Name: Philip Robbins MRN: [id number] Date of Birth: [birth year]  Transition of Care [facility]) CM/SW Contact  Marlowe Sax, RN Phone Number: 04/07/2023, 11:37 AM  Clinical Narrative:     The Patient will [facility] room [facility] for rehab Patient's wife is in the room and aware EMS called he is 2nd on the list  Expected Discharge Plan: Home w Home Health Services Barriers to Discharge: No Barriers Identified  Expected Discharge Plan and Services         Expected Discharge Date: 04/06/23                                     Social Determinants of Health (SDOH) Interventions SDOH Screenings   Food Insecurity: No Food Insecurity (04/03/2023)  Housing: Low Risk  (04/03/2023)  Transportation Needs: No Transportation Needs (04/03/2023)  Utilities: Not At Risk (04/03/2023)  Tobacco Use: Low Risk  (03/31/2023)    Readmission Risk Interventions    04/03/2023   11:20 AM  Readmission Risk Prevention Plan  Transportation Screening Complete  PCP or Specialist Appt within 3-5 Days Complete  HRI or Home Care Consult Complete  Social Work Consult for Recovery Care Planning/Counseling Complete  Palliative Care Screening Not Applicable  Medication Review Oceanographer) Not Complete  Med Review Comments will review upon discharge

## 2023-04-07 NOTE — [place] FL2 (Signed)
[place] MEDICAID FL2 LEVEL OF CARE FORM     IDENTIFICATION  Patient Name: Philip Robbins Birthdate: 01-24-1941 Sex: male Admission Date (Current Location): 04/02/2023  [facility] and [place] Number:  Chiropodist and Address:  [facility], [address], [place], [place] [address]      Provider Number: [id number]  Attending Physician Name and Address:  Charise Killian, MD  Relative Name and Phone Number:  Gay Filler [phone]    Current Level of Care: Hospital Recommended Level of Care: Skilled Nursing Facility Prior Approval Number:    Date Approved/Denied:   PASRR Number: [id number]  Discharge Plan: SNF    Current Diagnoses: Patient Active Problem List   Diagnosis Date Noted   Hypothyroidism 04/03/2023   Chronic diastolic CHF (congestive heart failure) (HCC) 04/02/2023   Obesity (BMI 30-39.9) 04/02/2023   Iron deficiency anemia 04/02/2023   Diarrhea 04/02/2023   Type 2 diabetes mellitus without complication, without long-term current use of insulin (HCC) 03/23/2023   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) 03/23/2023   Acute metabolic encephalopathy 03/20/2023   Bilateral humeral fractures 03/18/2023   Acute hypoxic respiratory failure (HCC) 03/18/2023   Hypophosphatemia 03/18/2023   Hypomagnesemia 03/18/2023   CAP (community acquired pneumonia) 03/08/2023   Pneumonia 03/08/2023   Sepsis secondary to UTI (HCC) 03/08/2023   Hyponatremia 03/08/2023   BPH (benign prostatic hyperplasia) 12/21/2022   Irritable bowel syndrome without diarrhea 12/21/2022   Hypertension 12/21/2022   Bilateral hearing loss 01/23/2021   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/2017   Hyperlipidemia 01/17/2016   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   Lymphocytosis 05/04/2015   Status post arthroscopic surgery of right knee 04/10/2015   Tear of meniscus of knee 03/08/2015   Seasonal allergic rhinitis 02/17/2014   Herpes zoster ophthalmicus  11/14/2013    Orientation RESPIRATION BLADDER Height & Weight     Self, Time, Situation, Place  O2 Continent, External catheter Weight: 98.2 kg Height:     BEHAVIORAL SYMPTOMS/MOOD NEUROLOGICAL BOWEL NUTRITION STATUS      Continent Diet  AMBULATORY STATUS COMMUNICATION OF NEEDS Skin   Limited Assist Verbally Surgical wounds                       Personal Care Assistance Level of Assistance  Bathing, Feeding, Dressing Bathing Assistance: Maximum assistance Feeding assistance: Limited assistance Dressing Assistance: Maximum assistance     Functional Limitations Info  Sight, Hearing, Speech Sight Info: Adequate Hearing Info: Adequate Speech Info: Adequate    SPECIAL CARE FACTORS FREQUENCY  PT (By licensed PT), OT (By licensed OT)     PT Frequency: 5 times per week OT Frequency: 5 times per week            Contractures Contractures Info: Not present    Additional Factors Info  Code Status, Allergies Code Status Info: full code Allergies Info: NKDA           Current Medications (04/07/2023):  This is the current hospital active medication list Current Facility-Administered Medications  Medication Dose Route Frequency Provider Last Rate Last Admin   acetaminophen (TYLENOL) tablet 650 mg  650 mg Oral Q6H PRN Lorretta Harp, MD   650 mg at 04/05/23 2132   albuterol (PROVENTIL) (2.5 MG/3ML) 0.083% nebulizer solution 2.5 mg  2.5 mg Inhalation Q4H PRN Lorretta Harp, MD       amLODipine (NORVASC) tablet 5 mg  5 mg Oral Daily Lorretta Harp, MD   5 mg at  04/06/23 0904   atorvastatin (LIPITOR) tablet 40 mg  40 mg Oral Daily Lorretta Harp, MD   40 mg at 04/05/23 2145   cyanocobalamin (VITAMIN B12) tablet 1,000 mcg  1,000 mcg Oral Daily Lorretta Harp, MD   1,000 mcg at 04/06/23 0904   enoxaparin (LOVENOX) injection 50 mg  50 mg Subcutaneous Q24H Lorretta Harp, MD   50 mg at 04/06/23 2210   feeding supplement (ENSURE ENLIVE / ENSURE PLUS) liquid 237 mL  237 mL Oral Q24H Breeze, Gery Pray, NP    237 mL at 04/06/23 1028   folic acid (FOLVITE) tablet 1 mg  1 mg Oral Daily Lorretta Harp, MD   1 mg at 04/06/23 1610   furosemide (LASIX) tablet 20 mg  20 mg Oral Daily [name], NP   20 mg at 04/06/23 1305   hydrALAZINE (APRESOLINE) injection 5 mg  5 mg Intravenous Q2H PRN Lorretta Harp, MD       insulin aspart (novoLOG) injection 0-5 Units  0-5 Units Subcutaneous QHS Lorretta Harp, MD       insulin aspart (novoLOG) injection 0-9 Units  0-9 Units Subcutaneous TID WC Lorretta Harp, MD   1 Units at 04/07/23 9604   iron polysaccharides (NIFEREX) capsule 150 mg  150 mg Oral Daily Lorretta Harp, MD   150 mg at 04/06/23 5409   levothyroxine (SYNTHROID) tablet 75 mcg  75 mcg Oral Q0600 Lorretta Harp, MD   75 mcg at 04/07/23 0555   melatonin tablet 2.5 mg  2.5 mg Oral QHS Lorretta Harp, MD   2.5 mg at 04/06/23 2209   nystatin cream (MYCOSTATIN)   Topical BID Charise Killian, MD   Given at 04/06/23 2211   ondansetron [facility]) injection 4 mg  4 mg Intravenous Q8H PRN Lorretta Harp, MD       oxyCODONE (Oxy IR/ROXICODONE) immediate release tablet 5 mg  5 mg Oral Q6H PRN Lorretta Harp, MD   5 mg at 04/06/23 2209   sodium chloride (OCEAN) 0.65 % nasal [place] 2 [place]igan  2 Nicklin Each Nare PRN Lorretta Harp, MD       tamsulosin (FLOMAX) capsule 0.4 mg  0.4 mg Oral QHS Lorretta Harp, MD   0.4 mg at 04/06/23 2209     Discharge Medications: Please see discharge summary for a list of discharge medications.  Relevant Imaging Results:  Relevant Lab Results:   Additional Information SS #: [id number]  Renlee Floor Seward Carol, RN

## 2023-04-07 NOTE — Progress Notes (Signed)
1205 Called [facility] and gave report to [place]

## 2023-04-08 ENCOUNTER — Encounter: Payer: Self-pay | Admitting: Student

## 2023-04-08 ENCOUNTER — Non-Acute Institutional Stay (SKILLED_NURSING_FACILITY): Payer: BLUE CROSS/BLUE SHIELD | Admitting: Student

## 2023-04-08 DIAGNOSIS — S42301A Unspecified fracture of shaft of humerus, right arm, initial encounter for closed fracture: Secondary | ICD-10-CM

## 2023-04-08 DIAGNOSIS — I10 Essential (primary) hypertension: Secondary | ICD-10-CM | POA: Diagnosis not present

## 2023-04-08 DIAGNOSIS — R197 Diarrhea, unspecified: Secondary | ICD-10-CM

## 2023-04-08 DIAGNOSIS — E119 Type 2 diabetes mellitus without complications: Secondary | ICD-10-CM | POA: Diagnosis not present

## 2023-04-08 DIAGNOSIS — K909 Intestinal malabsorption, unspecified: Secondary | ICD-10-CM

## 2023-04-08 DIAGNOSIS — S42302A Unspecified fracture of shaft of humerus, left arm, initial encounter for closed fracture: Secondary | ICD-10-CM

## 2023-04-08 DIAGNOSIS — E871 Hypo-osmolality and hyponatremia: Secondary | ICD-10-CM

## 2023-04-08 NOTE — Progress Notes (Unsigned)
Provider:  Dr. Earnestine Mealing Location:  Other [facility].  Nursing Home Room Number: [facility] 108A Place of Service:  SNF (31)  PCP: Marina Goodell, MD Patient Care Team: Marina Goodell, MD as PCP - General (Family Medicine) Earna Coder, MD as Consulting Physician (Internal Medicine) Maryjane Hurter Madaline Guthrie, MD (Family Medicine)  Extended Emergency Contact Information Primary Emergency Contact: Hird,[name] Home Phone: [phone] Relation: Spouse Preferred language: English Interpreter needed? No Secondary Emergency Contact: Sana,Cara Mobile Phone: [phone] Relation: Daughter  Code Status: Full Code Goals of Care: Advanced Directive information    04/08/2023    9:28 AM  Advanced Directives  Does Patient Have a Medical Advance Directive? No  Does patient want to make changes to medical advance directive? No - Patient declined     Chief Complaint  Patient presents with   New Admit To SNF    Admission.     HPI: Patient is a 82 y.o. male seen today for admission to [facility]  Patient with past medical history of HTN, HLD, DM, DC HF, hypothyroidism, BPH, CLL, anemia, chronic hyponatremia, obesity with recent bilateral shoulder fracture with dislocation who was readmitted to [facility] for hyponatremia in the setting of diarrhea.  Diarrhea has since resolved.  Hyponatremia at baseline level of 130.  He states that he feels great.  He states on the day that he was admitted to the hospital he felt wonderful and was having visitors at bedside was not expecting to have to go to the hospital however explained his sodium at that time was 116 which increases risk for seizures.  Nephrology consulted and patient will have follow-up in upcoming weeks.  Negative workup for diarrhea.  History of gallbladder surgery which is likely related to chronic diarrhea.  He states in general he is feeling much better. The day he went tot he hospital he was having a lot of  diarrhea.  He had loose bowels today but it wasn't diarrhea.  His wife is at bedside and aids with history. Past Medical History:  Diagnosis Date   Arthritis    knee and back   BPH (benign prostatic hypertrophy)    Diabetes type 2, controlled (HCC)    DM type 2 (diabetes mellitus, type 2) (HCC)    Environmental allergies    Headache    sinus headaches   HLD (hyperlipidemia)    Hypothyroid    UTI (urinary tract infection)    Past Surgical History:  Procedure Laterality Date   CARDIAC CATHETERIZATION     CARDIAC CATHETERIZATION     no stents placed   CHOLECYSTECTOMY     KNEE ARTHROSCOPY Right 03/16/2015   Procedure: ARTHROSCOPY KNEE WITH PARTIAL MEDIAL MENISECTOMY, AND CHONDROPLASTY;  Surgeon: Erin Sons, MD;  Location: [facility];  Service: Orthopedics;  Laterality: Right;  Diabetic - oral meds   REVERSE SHOULDER ARTHROPLASTY Left 03/13/2023   Procedure: REVERSE SHOULDER ARTHROPLASTY;  Surgeon: Signa Kell, MD;  Location: [facility];  Service: Orthopedics;  Laterality: Left;   REVERSE SHOULDER ARTHROPLASTY Right 03/17/2023   Procedure: REVERSE SHOULDER ARTHROPLASTY;  Surgeon: Signa Kell, MD;  Location: [facility];  Service: Orthopedics;  Laterality: Right;    reports that he has never smoked. He has never used smokeless tobacco. He reports that he does not drink alcohol and does not use drugs. Social History   Socioeconomic History   Marital status: Married    Spouse name: Not on file   Number of children: Not on file  Years of education: Not on file   Highest education level: Not on file  Occupational History   Occupation: minister  Tobacco Use   Smoking status: Never   Smokeless tobacco: Never  Vaping Use   Vaping status: Never Used  Substance and Sexual Activity   Alcohol use: No    Alcohol/week: 0.0 standard drinks of alcohol   Drug use: No   Sexual activity: Not on file  Other Topics Concern   Not on file  Social History Narrative   ** Merged  History Encounter **       Social Determinants of Health   Financial Resource Strain: Not on file  Food Insecurity: No Food Insecurity (04/03/2023)   Hunger Vital Sign    Worried About Running Out of Food in the Last Year: Never true    Ran Out of Food in the Last Year: Never true  Transportation Needs: No Transportation Needs (04/03/2023)   PRAPARE - Administrator, Civil Service (Medical): No    Lack of Transportation (Non-Medical): No  Physical Activity: Not on file  Stress: Not on file  Social Connections: Not on file  Intimate Partner Violence: Not At Risk (04/03/2023)   Humiliation, Afraid, Rape, and Kick questionnaire    Fear of Current or Ex-Partner: No    Emotionally Abused: No    Physically Abused: No    Sexually Abused: No    Functional Status Survey:    Family History  Problem Relation Age of Onset   Renal Disease Father        ESRD   Diabetes Father    Breast cancer Sister     Health Maintenance  Topic Date Due   Medicare Annual Wellness (AWV)  Never done   FOOT EXAM  Never done   OPHTHALMOLOGY EXAM  Never done   Diabetic kidney evaluation - Urine ACR  Never done   INFLUENZA VACCINE  02/12/2023   COVID-19 Vaccine (5 - 2023-24 season) 03/15/2023   HEMOGLOBIN A1C  09/08/2023   Diabetic kidney evaluation - eGFR measurement  04/06/2024   DTaP/Tdap/Td (2 - Td or Tdap) 04/18/2029   Pneumonia Vaccine 25+ Years old  Completed   Zoster Vaccines- Shingrix  Completed   HPV VACCINES  Aged Out    No Known Allergies  Outpatient Encounter Medications as of 04/08/2023  Medication Sig   acetaminophen (TYLENOL) 500 MG tablet Take 1,000 mg by mouth every 8 (eight) hours as needed for mild pain. Give 2 tablets by mouth every 8 hours as needed.   amLODipine (NORVASC) 5 MG tablet Take 5 mg by mouth daily.   ascorbic acid (VITAMIN C) 500 MG tablet Take 1 tablet (500 mg total) by mouth daily.   atorvastatin (LIPITOR) 40 MG tablet Take 40 mg by mouth daily.    cyanocobalamin 1000 MCG tablet Take 1 tablet (1,000 mcg total) by mouth daily.   docusate sodium (COLACE) 100 MG capsule Take 1 capsule (100 mg total) by mouth 2 (two) times daily.   fluticasone (FLONASE) 50 MCG/ACT nasal Schara Place 2 sprays into both nostrils every evening.   folic acid (FOLVITE) 1 MG tablet Take 1 mg by mouth daily.   guaiFENesin (MUCINEX) 600 MG 12 hr tablet Take 600 mg by mouth 2 (two) times daily as needed.   iron polysaccharides (NIFEREX) 150 MG capsule Take 1 capsule (150 mg total) by mouth daily.   levothyroxine (SYNTHROID) 75 MCG tablet Take 75 mcg by mouth daily before breakfast.   lisinopril (  PRINIVIL,ZESTRIL) 10 MG tablet Take 10 mg by mouth daily.   melatonin 3 MG TABS tablet Take 3 mg by mouth at bedtime.   metFORMIN (GLUCOPHAGE) 1000 MG tablet Take 1 tablet by mouth 2 (two) times daily with a meal.   nystatin cream (MYCOSTATIN) Apply 1 Application topically. Every shift.   oxycodone (OXY-IR) 5 MG capsule Take 5 mg by mouth every 6 (six) hours as needed for pain.   OXYGEN 2 LPM every 8 hours as needed.   oxymetazoline (AFRIN) 0.05 % nasal Gudiel Place 1 Fidel into both nostrils 2 (two) times daily.   pioglitazone (ACTOS) 45 MG tablet Take 45 mg by mouth daily.   sitaGLIPtin (JANUVIA) 50 MG tablet Take 50 mg by mouth daily.   sodium chloride (OCEAN) 0.65 % SOLN nasal Jeansonne Place 2 sprays into both nostrils as needed for congestion.   tamsulosin (FLOMAX) 0.4 MG CAPS capsule Take 0.4 mg by mouth at bedtime.   Vitamin D, Ergocalciferol, (DRISDOL) 1.25 MG (50000 UNIT) CAPS capsule Take 1 capsule (50,000 Units total) by mouth every 7 (seven) days.   ZINC OXIDE EX Apply topically. Apply to buttocks topically every shift for skin   [DISCONTINUED] bisacodyl (DULCOLAX) 10 MG suppository Place 1 suppository (10 mg total) rectally daily as needed for moderate constipation. (Patient not taking: Reported on 04/02/2023)   [DISCONTINUED] furosemide (LASIX) 20 MG tablet Take 1  tablet (20 mg total) by mouth daily.   [DISCONTINUED] guaiFENesin (ROBITUSSIN) 100 MG/5ML liquid Take 10 mLs by mouth every 4 (four) hours as needed for cough or to loosen phlegm. Give 10ml by mouth once daily   No facility-administered encounter medications on file as of 04/08/2023.    Review of Systems  Vitals:   04/08/23 0918 04/08/23 0933  BP: (!) 155/81 (!) 151/76  Pulse: 74   Resp: 14   Temp: 97.6 F (36.4 C)   SpO2: 90%   Weight: 221 lb (100.2 kg)   Height: 5' 7.5" (1.715 m)    Body mass index is 34.1 kg/m. Physical Exam Constitutional:      Appearance: Normal appearance.  Cardiovascular:     Rate and Rhythm: Normal rate and regular rhythm.     Pulses: Normal pulses.     Heart sounds: Normal heart sounds.  Pulmonary:     Effort: Pulmonary effort is normal.     Breath sounds: Normal breath sounds.  Abdominal:     General: Bowel sounds are normal.     Palpations: Abdomen is soft.  Musculoskeletal:     Comments: Bilateral upper extremity slings in place.  Neurological:     General: No focal deficit present.     Mental Status: He is alert and oriented to person, place, and time.     Labs reviewed: Basic Metabolic Panel: Recent Labs    03/17/23 0314 03/18/23 0801 03/19/23 0416 03/20/23 0439 03/23/23 0000 04/02/23 2049 04/03/23 0403 04/05/23 0343 04/06/23 0631 04/07/23 0555  NA 128* 128* 129* 129*   < > 116*   < > 130* 131* 130*  K 3.7 4.3 4.0 4.0   < > 3.8   < > 3.8 4.1 4.4  CL 93* 96* 95* 97*   < > 86*   < > 101 101 99  CO2 27 24 25 23    < > 20*   < > 23 23 23   GLUCOSE 254* 253* 242* 199*   < > 125*   < > 148* 143* 134*  BUN 16 16 11 12    < >  7*   < > 7* 5* <5*  CREATININE 0.74 0.67 0.66 0.63   < > 0.57*   < > 0.49* 0.48* 0.55*  CALCIUM 8.1* 7.9* 7.7* 7.7*   < > 8.1*   < > 7.9* 8.7* 8.8*  MG 1.9 1.8 1.6* 1.9  --  1.5*  --   --   --   --   PHOS 3.1 2.5  --   --   --  2.6  --   --   --   --    < > = values in this interval not displayed.   Liver  Function Tests: Recent Labs    03/17/23 0314 03/18/23 0801 03/23/23 0000 03/30/23 0000 04/02/23 1707  AST 48* 36 23 19 29   ALT 76* 62* 48* 25 22  ALKPHOS 58 56 75 76 82  BILITOT 1.0 0.3  --   --  0.9  PROT 5.9* 5.4*  --   --  6.3*  ALBUMIN 2.5* 2.3* 3.0* 3.1* 3.1*   No results for input(s): "LIPASE", "AMYLASE" in the last 8760 hours. No results for input(s): "AMMONIA" in the last 8760 hours. CBC: Recent Labs    02/05/23 1004 03/08/23 0620 03/09/23 0436 04/02/23 1707 04/03/23 1106 04/05/23 0343 04/06/23 0631 04/07/23 0555  WBC 28.6* 35.1*   < > 37.9*   < > 30.5* 28.9* 26.8*  NEUTROABS 6.8 19.8*  --  8.7*  --   --   --   --   HGB 13.8 12.1*   < > 10.2*   < > 8.9* 9.9* 9.8*  HCT 43.7 36.5*   < > 32.3*   < > 26.9* 31.0* 30.0*  MCV 93.2 90.1   < > 91.8   < > 87.6 90.6 88.2  PLT 302 218   < > 488*   < > 405* 402* 347   < > = values in this interval not displayed.   Cardiac Enzymes: Recent Labs    03/10/23 0430 03/11/23 0418 03/12/23 0450  CKTOTAL 426* 437* 413*   BNP: Invalid input(s): "POCBNP" Lab Results  Component Value Date   HGBA1C 6.7 (H) 03/08/2023   Lab Results  Component Value Date   TSH 2.679 03/08/2023   Lab Results  Component Value Date   VITAMINB12 109 (L) 03/09/2023   Lab Results  Component Value Date   FOLATE 8.3 03/09/2023   Lab Results  Component Value Date   IRON 16 (L) 03/09/2023   TIBC 259 03/09/2023    Imaging and Procedures obtained prior to SNF admission: No results found.  Assessment/Plan Hyponatremia  Closed fracture of both humeri, initial encounter  Type 2 diabetes mellitus without complication, without long-term current use of insulin (HCC)  Hypertension, unspecified type  Diarrhea due to malabsorption Patient with severe hyponatremia in the setting of severe diarrhea.  Diarrhea has since resolved resolved.  Sodium most recently 130, repeat ordered for this week.  Iron deficiency anemia, on iron supplementation.   Mood pain minimal with bilateral humeral fractures, continue physical therapy.  Discussed concern for potential impact of metformin on diarrhea, however family does declined discontinuation of diabetic medication.  Will trial cholestyramine 2 g Q ID for diarrhea to prevent worsening hyponatremia.  Patient is no longer on sodium tablets, continue to monitor sodium levels.  Blood pressure slightly elevated at this time currently on amlodipine, lisinopril at this time.  Will consider increasing lisinopril if no improvement of pressures.   Family/ staff Communication:   Labs/tests ordered:

## 2023-04-09 LAB — CBC AND DIFFERENTIAL
HCT: 35 — AB (ref 41–53)
Hemoglobin: 11 — AB (ref 13.5–17.5)
Neutrophils Absolute: 7477
Platelets: 323 10*3/uL (ref 150–400)
WBC: 26.8

## 2023-04-09 LAB — BASIC METABOLIC PANEL
BUN: 11 (ref 4–21)
CO2: 24 — AB (ref 13–22)
Chloride: 96 — AB (ref 99–108)
Creatinine: 0.7 (ref 0.6–1.3)
Glucose: 137
Potassium: 4.8 meq/L (ref 3.5–5.1)
Sodium: 130 — AB (ref 137–147)

## 2023-04-09 LAB — COMPREHENSIVE METABOLIC PANEL
Calcium: 9.1 (ref 8.7–10.7)
eGFR: 94

## 2023-04-09 LAB — CBC: RBC: 3.79 — AB (ref 3.87–5.11)

## 2023-04-13 LAB — BASIC METABOLIC PANEL
BUN: 10 (ref 4–21)
CO2: 22 (ref 13–22)
Chloride: 98 — AB (ref 99–108)
Creatinine: 0.6 (ref 0.6–1.3)
Glucose: 112
Potassium: 4.3 meq/L (ref 3.5–5.1)
Sodium: 127 — AB (ref 137–147)

## 2023-04-13 LAB — COMPREHENSIVE METABOLIC PANEL
Calcium: 9 (ref 8.7–10.7)
eGFR: 100

## 2023-04-14 ENCOUNTER — Encounter: Payer: Self-pay | Admitting: Nurse Practitioner

## 2023-04-14 ENCOUNTER — Non-Acute Institutional Stay (SKILLED_NURSING_FACILITY): Payer: Self-pay | Admitting: Nurse Practitioner

## 2023-04-14 DIAGNOSIS — S42302D Unspecified fracture of shaft of humerus, left arm, subsequent encounter for fracture with routine healing: Secondary | ICD-10-CM | POA: Diagnosis not present

## 2023-04-14 DIAGNOSIS — S42301D Unspecified fracture of shaft of humerus, right arm, subsequent encounter for fracture with routine healing: Secondary | ICD-10-CM

## 2023-04-14 DIAGNOSIS — E871 Hypo-osmolality and hyponatremia: Secondary | ICD-10-CM | POA: Diagnosis not present

## 2023-04-14 DIAGNOSIS — K589 Irritable bowel syndrome without diarrhea: Secondary | ICD-10-CM | POA: Diagnosis not present

## 2023-04-14 NOTE — Progress Notes (Signed)
Careteam: Patient Care Team: Marina Goodell, MD as PCP - General (Family Medicine) Earna Coder, MD as Consulting Physician (Internal Medicine) Maryjane Hurter, Madaline Guthrie, MD (Family Medicine)  PLACE OF SERVICE:  [facility]  Advanced Directive information Does Patient Have a Medical Advance Directive?: No, Does patient want to make changes to medical advance directive?: No - Patient declined  No Known Allergies  Chief Complaint  Patient presents with   Acute Visit    Low Sodium     HPI: Patient is a 82 y.o. male follow up sodium  Pt was taken off sodium tablets during hospitalization Now down to 127. No symptoms No diarrhea, no LE edema Maintaining fluid restriction Reports prior to hospitalization and SNF stay he was taking a sodium supplement due to chronically low sodium - per chart review ranging grom 129-133  Pt reports he is doing well from a rehab stand point. Working with therapy and able to move better. Wife is helping with ADLs. Hoping to go home soon.  Denies pain  Review of Systems:  Review of Systems  Constitutional:  Negative for chills, fever and weight loss.  HENT:  Negative for tinnitus.   Respiratory:  Negative for cough, sputum production and shortness of breath.   Cardiovascular:  Negative for chest pain, palpitations and leg swelling.  Gastrointestinal:  Negative for abdominal pain, constipation, diarrhea and heartburn.       1 loose stool daily denies diarrhea  Genitourinary:  Negative for dysuria, frequency and urgency.  Musculoskeletal:  Negative for back pain, falls, joint pain and myalgias.       Limited ROM to bilateral shoulder and arms in sling  Skin: Negative.   Neurological:  Negative for dizziness and headaches.  Psychiatric/Behavioral:  Negative for depression and memory loss. The patient does not have insomnia.     Past Medical History:  Diagnosis Date   Arthritis    knee and back   BPH (benign prostatic  hypertrophy)    Diabetes type 2, controlled (HCC)    DM type 2 (diabetes mellitus, type 2) (HCC)    Environmental allergies    Headache    sinus headaches   HLD (hyperlipidemia)    Hypothyroid    UTI (urinary tract infection)    Past Surgical History:  Procedure Laterality Date   CARDIAC CATHETERIZATION     CARDIAC CATHETERIZATION     no stents placed   CHOLECYSTECTOMY     KNEE ARTHROSCOPY Right 03/16/2015   Procedure: ARTHROSCOPY KNEE WITH PARTIAL MEDIAL MENISECTOMY, AND CHONDROPLASTY;  Surgeon: Erin Sons, MD;  Location: [facility];  Service: Orthopedics;  Laterality: Right;  Diabetic - oral meds   REVERSE SHOULDER ARTHROPLASTY Left 03/13/2023   Procedure: REVERSE SHOULDER ARTHROPLASTY;  Surgeon: Signa Kell, MD;  Location: [facility];  Service: Orthopedics;  Laterality: Left;   REVERSE SHOULDER ARTHROPLASTY Right 03/17/2023   Procedure: REVERSE SHOULDER ARTHROPLASTY;  Surgeon: Signa Kell, MD;  Location: [facility];  Service: Orthopedics;  Laterality: Right;   Social History:   reports that he has never smoked. He has never used smokeless tobacco. He reports that he does not drink alcohol and does not use drugs.  Family History  Problem Relation Age of Onset   Renal Disease Father        ESRD   Diabetes Father    Breast cancer Sister     Medications: Patient's Medications  New Prescriptions   No medications on file  Previous Medications  ACETAMINOPHEN (TYLENOL) 500 MG TABLET    Take 1,000 mg by mouth every 8 (eight) hours as needed for mild pain. Give 2 tablets by mouth every 8 hours as needed.   AMLODIPINE (NORVASC) 5 MG TABLET    Take 5 mg by mouth daily.   ASCORBIC ACID (VITAMIN C) 500 MG TABLET    Take 1 tablet (500 mg total) by mouth daily.   ATORVASTATIN (LIPITOR) 40 MG TABLET    Take 40 mg by mouth daily.   BISACODYL (DULCOLAX) 10 MG SUPPOSITORY    Place 10 mg rectally daily as needed for moderate constipation.   CHOLESTYRAMINE (QUESTRAN) 4 G PACKET     Take 4 g by mouth 3 (three) times daily.   CYANOCOBALAMIN 1000 MCG TABLET    Take 1 tablet (1,000 mcg total) by mouth daily.   DOCUSATE SODIUM (COLACE) 100 MG CAPSULE    Take 1 capsule (100 mg total) by mouth 2 (two) times daily.   FLUTICASONE (FLONASE) 50 MCG/ACT NASAL [place]    Place 2 sprays into both nostrils every evening.   FOLIC ACID (FOLVITE) 1 MG TABLET    Take 1 mg by mouth daily.   IRON POLYSACCHARIDES (NIFEREX) 150 MG CAPSULE    Take 1 capsule (150 mg total) by mouth daily.   LEVOTHYROXINE (SYNTHROID) 75 MCG TABLET    Take 75 mcg by mouth daily before breakfast.   LISINOPRIL (PRINIVIL,ZESTRIL) 10 MG TABLET    Take 10 mg by mouth daily.   MELATONIN 3 MG TABS TABLET    Take 3 mg by mouth at bedtime.   METFORMIN (GLUCOPHAGE) 1000 MG TABLET    Take 1 tablet by mouth 2 (two) times daily with a meal.   NYSTATIN CREAM (MYCOSTATIN)    Apply 1 Application topically. Every shift.   OXYCODONE (OXY-IR) 5 MG CAPSULE    Take 5 mg by mouth every 6 (six) hours as needed for pain.   OXYGEN    2 LPM every 8 hours as needed.   OXYMETAZOLINE (AFRIN) 0.05 % NASAL [place]    Place 1 [place] into both nostrils 2 (two) times daily.   PIOGLITAZONE (ACTOS) 45 MG TABLET    Take 45 mg by mouth daily.   SITAGLIPTIN (JANUVIA) 50 MG TABLET    Take 50 mg by mouth daily.   SODIUM CHLORIDE (OCEAN) 0.65 % SOLN NASAL [place]    Place 2 sprays into both nostrils as needed for congestion.   TAMSULOSIN (FLOMAX) 0.4 MG CAPS CAPSULE    Take 0.4 mg by mouth at bedtime.   VITAMIN D, ERGOCALCIFEROL, (DRISDOL) 1.25 MG (50000 UNIT) CAPS CAPSULE    Take 1 capsule (50,000 Units total) by mouth every 7 (seven) days.   ZINC OXIDE EX    Apply topically. Apply to buttocks topically every shift for skin  Modified Medications   No medications on file  Discontinued Medications   GUAIFENESIN (MUCINEX) 600 MG 12 HR TABLET    Take 600 mg by mouth 2 (two) times daily as needed.    Physical Exam:  Vitals:   04/14/23 0936 04/14/23 0948  BP:  (!) 155/74 125/68  Pulse: 85   Resp: (!) 22   Temp: 97.8 F (36.6 C)   SpO2: 94%   Weight: 221 lb (100.2 kg)   Height: 5' 7.5" (1.715 m)    Body mass index is 34.1 kg/m. Wt Readings from Last 3 Encounters:  04/14/23 221 lb (100.2 kg)  04/08/23 221 lb (100.2 kg)  04/07/23  216 lb 7.9 oz (98.2 kg)    Physical Exam Constitutional:      General: He is not in acute distress.    Appearance: He is well-developed. He is not diaphoretic.  HENT:     Head: Normocephalic and atraumatic.     Right Ear: External ear normal.     Left Ear: External ear normal.     Mouth/Throat:     Pharynx: No oropharyngeal exudate.  Eyes:     Conjunctiva/sclera: Conjunctivae normal.     Pupils: Pupils are equal, round, and reactive to light.  Cardiovascular:     Rate and Rhythm: Normal rate and regular rhythm.     Heart sounds: Normal heart sounds.  Pulmonary:     Effort: Pulmonary effort is normal.     Breath sounds: Normal breath sounds.  Abdominal:     General: Bowel sounds are normal.     Palpations: Abdomen is soft.  Musculoskeletal:        General: No tenderness.     Cervical back: Normal range of motion and neck supple.     Right lower leg: No edema.     Left lower leg: No edema.  Skin:    General: Skin is warm and dry.  Neurological:     Mental Status: He is alert and oriented to person, place, and time.     Labs reviewed: Basic Metabolic Panel: Recent Labs    03/08/23 1033 03/08/23 1110 03/17/23 0314 03/18/23 0801 03/19/23 0416 03/20/23 0439 03/23/23 0000 04/02/23 2049 04/03/23 0403 04/05/23 0343 04/06/23 0631 04/07/23 0555 04/09/23 0000 04/13/23 0000  NA  --    < > 128* 128* 129* 129*   < > 116*   < > 130* 131* 130* 130* 127*  K  --    < > 3.7 4.3 4.0 4.0   < > 3.8   < > 3.8 4.1 4.4 4.8 4.3  CL  --    < > 93* 96* 95* 97*   < > 86*   < > 101 101 99 96* 98*  CO2  --    < > 27 24 25 23    < > 20*   < > 23 23 23  24* 22  GLUCOSE  --    < > 254* 253* 242* 199*   < > 125*    < > 148* 143* 134*  --   --   BUN  --    < > 16 16 11 12    < > 7*   < > 7* 5* <5* 11 10  CREATININE  --    < > 0.74 0.67 0.66 0.63   < > 0.57*   < > 0.49* 0.48* 0.55* 0.7 0.6  CALCIUM  --    < > 8.1* 7.9* 7.7* 7.7*   < > 8.1*   < > 7.9* 8.7* 8.8* 9.1 9.0  MG  --    < > 1.9 1.8 1.6* 1.9  --  1.5*  --   --   --   --   --   --   PHOS  --    < > 3.1 2.5  --   --   --  2.6  --   --   --   --   --   --   TSH 2.679  --   --   --   --   --   --   --   --   --   --   --   --   --    < > =  values in this interval not displayed.   Liver Function Tests: Recent Labs    03/17/23 0314 03/18/23 0801 03/23/23 0000 03/30/23 0000 04/02/23 1707  AST 48* 36 23 19 29   ALT 76* 62* 48* 25 22  ALKPHOS 58 56 75 76 82  BILITOT 1.0 0.3  --   --  0.9  PROT 5.9* 5.4*  --   --  6.3*  ALBUMIN 2.5* 2.3* 3.0* 3.1* 3.1*   No results for input(s): "LIPASE", "AMYLASE" in the last 8760 hours. No results for input(s): "AMMONIA" in the last 8760 hours. CBC: Recent Labs    03/08/23 0620 03/09/23 0436 04/02/23 1707 04/03/23 1106 04/05/23 0343 04/06/23 0631 04/07/23 0555 04/09/23 0000  WBC 35.1*   < > 37.9*   < > 30.5* 28.9* 26.8* 26.8  NEUTROABS 19.8*  --  8.7*  --   --   --   --  7,477.00  HGB 12.1*   < > 10.2*   < > 8.9* 9.9* 9.8* 11.0*  HCT 36.5*   < > 32.3*   < > 26.9* 31.0* 30.0* 35*  MCV 90.1   < > 91.8   < > 87.6 90.6 88.2  --   PLT 218   < > 488*   < > 405* 402* 347 323   < > = values in this interval not displayed.   Lipid Panel: No results for input(s): "CHOL", "HDL", "LDLCALC", "TRIG", "CHOLHDL", "LDLDIRECT" in the last 8760 hours. TSH: Recent Labs    03/08/23 1033  TSH 2.679   A1C: Lab Results  Component Value Date   HGBA1C 6.7 (H) 03/08/2023     Assessment/Plan 1. Hyponatremia Sodium tablets were stopped in the hospital and now sodium trending downward Will restart sodium 500 mg BID at this time and recheck BMP in 2 days -nephrology follow up- staff will schedule  2. Irritable  bowel syndrome without diarrhea Stable at this time.   3. Closed fracture of both humeri with routine healing, subsequent encounter -healing well, continues to work with pt/ot but ready to go home. Hoping to get cleared for discharge from therapy department.    Janene Harvey. [facility] [phone]

## 2023-04-17 ENCOUNTER — Non-Acute Institutional Stay (SKILLED_NURSING_FACILITY): Payer: Medicare Other | Admitting: Student

## 2023-04-17 DIAGNOSIS — S42301D Unspecified fracture of shaft of humerus, right arm, subsequent encounter for fracture with routine healing: Secondary | ICD-10-CM | POA: Diagnosis not present

## 2023-04-17 DIAGNOSIS — E871 Hypo-osmolality and hyponatremia: Secondary | ICD-10-CM | POA: Diagnosis not present

## 2023-04-17 DIAGNOSIS — I10 Essential (primary) hypertension: Secondary | ICD-10-CM

## 2023-04-17 DIAGNOSIS — E119 Type 2 diabetes mellitus without complications: Secondary | ICD-10-CM | POA: Diagnosis not present

## 2023-04-17 DIAGNOSIS — R197 Diarrhea, unspecified: Secondary | ICD-10-CM

## 2023-04-17 DIAGNOSIS — S42302D Unspecified fracture of shaft of humerus, left arm, subsequent encounter for fracture with routine healing: Secondary | ICD-10-CM

## 2023-04-17 DIAGNOSIS — K909 Intestinal malabsorption, unspecified: Secondary | ICD-10-CM

## 2023-04-17 DIAGNOSIS — C9110 Chronic lymphocytic leukemia of B-cell type not having achieved remission: Secondary | ICD-10-CM

## 2023-04-17 NOTE — Progress Notes (Unsigned)
Location:  Other Nursing Home Room Number: [facility] [address] of Service:  [phone])  Provider: Sydnee Cabal  PCP: Marina Goodell, MD Patient Care Team: Marina Goodell, MD as PCP - General (Family Medicine) Earna Coder, MD as Consulting Physician (Internal Medicine) Maryjane Hurter Madaline Guthrie, MD (Family Medicine)  Extended Emergency Contact Information Primary Emergency Contact: Uhrich,[name] Home Phone: [phone] Relation: Spouse Preferred language: [place] Interpreter needed? No Secondary Emergency Contact: Sana,Cara Mobile Phone: [phone] Relation: Daughter  Code Status: Full code Goals of care:  Advanced Directive information    04/14/2023    9:44 AM  Advanced Directives  Does Patient Have a Medical Advance Directive? No  Does patient want to make changes to medical advance directive? No - Patient declined     No Known Allergies  Chief Complaint  Patient presents with   Discharge Note    HPI:  82 y.o. male  with past medical history of HTN, chronic hyponatremia, T2DM who was admitted after bilateral humeral fractures. Patient is doing well and was graduated from physcial therapy. He states he feels ready to be home since it has been a month. Spouse was initially concerned due to complications of hyponatremia during his stay at the facility, however, she didn't realize this has been an issue for years. They state they have plenty of help in the home with spouse, children, grandchildren.     Past Medical History:  Diagnosis Date   Arthritis    knee and back   BPH (benign prostatic hypertrophy)    Diabetes type 2, controlled (HCC)    DM type 2 (diabetes mellitus, type 2) (HCC)    Environmental allergies    Headache    sinus headaches   HLD (hyperlipidemia)    Hypothyroid    UTI (urinary tract infection)     Past Surgical History:  Procedure Laterality Date   CARDIAC CATHETERIZATION     CARDIAC CATHETERIZATION     no stents placed    CHOLECYSTECTOMY     KNEE ARTHROSCOPY Right 03/16/2015   Procedure: ARTHROSCOPY KNEE WITH PARTIAL MEDIAL MENISECTOMY, AND CHONDROPLASTY;  Surgeon: Erin Sons, MD;  Location: [facility];  Service: Orthopedics;  Laterality: Right;  Diabetic - oral meds   REVERSE SHOULDER ARTHROPLASTY Left 03/13/2023   Procedure: REVERSE SHOULDER ARTHROPLASTY;  Surgeon: Signa Kell, MD;  Location: [facility];  Service: Orthopedics;  Laterality: Left;   REVERSE SHOULDER ARTHROPLASTY Right 03/17/2023   Procedure: REVERSE SHOULDER ARTHROPLASTY;  Surgeon: Signa Kell, MD;  Location: [facility];  Service: Orthopedics;  Laterality: Right;      reports that he has never smoked. He has never used smokeless tobacco. He reports that he does not drink alcohol and does not use drugs. Social History   Socioeconomic History   Marital status: Married    Spouse name: Not on file   Number of children: Not on file   Years of education: Not on file   Highest education level: Not on file  Occupational History   Occupation: minister  Tobacco Use   Smoking status: Never   Smokeless tobacco: Never  Vaping Use   Vaping status: Never Used  Substance and Sexual Activity   Alcohol use: No    Alcohol/week: 0.0 standard drinks of alcohol   Drug use: No   Sexual activity: Not on file  Other Topics Concern   Not on file  Social History Narrative   ** Merged History Encounter **       Social Determinants of  Health   Financial Resource Strain: Not on file  Food Insecurity: No Food Insecurity (04/03/2023)   Hunger Vital Sign    Worried About Running Out of Food in the Last Year: Never true    Ran Out of Food in the Last Year: Never true  Transportation Needs: No Transportation Needs (04/03/2023)   PRAPARE - Administrator, Civil Service (Medical): No    Lack of Transportation (Non-Medical): No  Physical Activity: Not on file  Stress: Not on file  Social Connections: Not on file  Intimate Partner Violence:  Not At Risk (04/03/2023)   Humiliation, Afraid, Rape, and Kick questionnaire    Fear of Current or Ex-Partner: No    Emotionally Abused: No    Physically Abused: No    Sexually Abused: No   Functional Status Survey:    No Known Allergies  Pertinent  Health Maintenance Due  Topic Date Due   FOOT EXAM  Never done   OPHTHALMOLOGY EXAM  Never done   INFLUENZA VACCINE  02/12/2023   HEMOGLOBIN A1C  09/08/2023    Medications: Outpatient Encounter Medications as of 04/17/2023  Medication Sig   acetaminophen (TYLENOL) 500 MG tablet Take 1,000 mg by mouth every 8 (eight) hours as needed for mild pain. Give 2 tablets by mouth every 8 hours as needed.   amLODipine (NORVASC) 5 MG tablet Take 5 mg by mouth daily.   ascorbic acid (VITAMIN C) 500 MG tablet Take 1 tablet (500 mg total) by mouth daily.   atorvastatin (LIPITOR) 40 MG tablet Take 40 mg by mouth daily.   bisacodyl (DULCOLAX) 10 MG suppository Place 10 mg rectally daily as needed for moderate constipation.   cholestyramine (QUESTRAN) 4 g packet Take 4 g by mouth 3 (three) times daily.   cyanocobalamin 1000 MCG tablet Take 1 tablet (1,000 mcg total) by mouth daily.   docusate sodium (COLACE) 100 MG capsule Take 1 capsule (100 mg total) by mouth 2 (two) times daily.   fluticasone (FLONASE) 50 MCG/ACT nasal [place] Place 2 sprays into both nostrils every evening.   folic acid (FOLVITE) 1 MG tablet Take 1 mg by mouth daily.   iron polysaccharides (NIFEREX) 150 MG capsule Take 1 capsule (150 mg total) by mouth daily.   levothyroxine (SYNTHROID) 75 MCG tablet Take 75 mcg by mouth daily before breakfast.   lisinopril (PRINIVIL,ZESTRIL) 10 MG tablet Take 10 mg by mouth daily.   melatonin 3 MG TABS tablet Take 3 mg by mouth at bedtime.   metFORMIN (GLUCOPHAGE) 1000 MG tablet Take 1 tablet by mouth 2 (two) times daily with a meal.   nystatin cream (MYCOSTATIN) Apply 1 Application topically. Every shift.   oxycodone (OXY-IR) 5 MG capsule Take 5 mg  by mouth every 6 (six) hours as needed for pain.   OXYGEN 2 LPM every 8 hours as needed.   oxymetazoline (AFRIN) 0.05 % nasal [address] Place 1 [place] into both nostrils 2 (two) times daily.   pioglitazone (ACTOS) 45 MG tablet Take 45 mg by mouth daily.   sitaGLIPtin (JANUVIA) 50 MG tablet Take 50 mg by mouth daily.   sodium chloride (OCEAN) 0.65 % SOLN nasal [address] Place 2 sprays into both nostrils as needed for congestion.   tamsulosin (FLOMAX) 0.4 MG CAPS capsule Take 0.4 mg by mouth at bedtime.   Vitamin D, Ergocalciferol, (DRISDOL) 1.25 MG (50000 UNIT) CAPS capsule Take 1 capsule (50,000 Units total) by mouth every 7 (seven) days.   ZINC OXIDE EX Apply  topically. Apply to buttocks topically every shift for skin   No facility-administered encounter medications on file as of 04/17/2023.    Review of Systems  Vitals:   04/18/23 1239  BP: 133/77  Pulse: 79  Resp: 16  SpO2: 95%  Weight: 216 lb 6.4 oz (98.2 kg)  Height: 5\' 7"  (1.702 m)   Body mass index is 33.89 kg/m. Physical Exam Constitutional:      Appearance: Normal appearance.  Cardiovascular:     Rate and Rhythm: Normal rate and regular rhythm.     Pulses: Normal pulses.  Pulmonary:     Effort: Pulmonary effort is normal.     Breath sounds: Normal breath sounds.  Abdominal:     General: Abdomen is flat. Bowel sounds are normal.  Musculoskeletal:        General: No swelling.  Skin:    General: Skin is warm and dry.  Neurological:     Mental Status: He is alert and oriented to person, place, and time.     Labs reviewed: Basic Metabolic Panel: Recent Labs    03/17/23 0314 03/18/23 0801 03/19/23 0416 03/20/23 0439 03/23/23 0000 04/02/23 2049 04/03/23 0403 04/05/23 0343 04/06/23 0631 04/07/23 0555 04/09/23 0000 04/13/23 0000  NA 128* 128* 129* 129*   < > 116*   < > 130* 131* 130* 130* 127*  K 3.7 4.3 4.0 4.0   < > 3.8   < > 3.8 4.1 4.4 4.8 4.3  CL 93* 96* 95* 97*   < > 86*   < > 101 101 99 96* 98*  CO2 27 24  25 23    < > 20*   < > 23 23 23  24* 22  GLUCOSE 254* 253* 242* 199*   < > 125*   < > 148* 143* 134*  --   --   BUN 16 16 11 12    < > 7*   < > 7* 5* <5* 11 10  CREATININE 0.74 0.67 0.66 0.63   < > 0.57*   < > 0.49* 0.48* 0.55* 0.7 0.6  CALCIUM 8.1* 7.9* 7.7* 7.7*   < > 8.1*   < > 7.9* 8.7* 8.8* 9.1 9.0  MG 1.9 1.8 1.6* 1.9  --  1.5*  --   --   --   --   --   --   PHOS 3.1 2.5  --   --   --  2.6  --   --   --   --   --   --    < > = values in this interval not displayed.   Liver Function Tests: Recent Labs    03/17/23 0314 03/18/23 0801 03/23/23 0000 03/30/23 0000 04/02/23 1707  AST 48* 36 23 19 29   ALT 76* 62* 48* 25 22  ALKPHOS 58 56 75 76 82  BILITOT 1.0 0.3  --   --  0.9  PROT 5.9* 5.4*  --   --  6.3*  ALBUMIN 2.5* 2.3* 3.0* 3.1* 3.1*   No results for input(s): "LIPASE", "AMYLASE" in the last 8760 hours. No results for input(s): "AMMONIA" in the last 8760 hours. CBC: Recent Labs    03/08/23 0620 03/09/23 0436 04/02/23 1707 04/03/23 1106 04/05/23 0343 04/06/23 0631 04/07/23 0555 04/09/23 0000  WBC 35.1*   < > 37.9*   < > 30.5* 28.9* 26.8* 26.8  NEUTROABS 19.8*  --  8.7*  --   --   --   --  7,477.00  HGB  12.1*   < > 10.2*   < > 8.9* 9.9* 9.8* 11.0*  HCT 36.5*   < > 32.3*   < > 26.9* 31.0* 30.0* 35*  MCV 90.1   < > 91.8   < > 87.6 90.6 88.2  --   PLT 218   < > 488*   < > 405* 402* 347 323   < > = values in this interval not displayed.   Cardiac Enzymes: Recent Labs    03/10/23 0430 03/11/23 0418 03/12/23 0450  CKTOTAL 426* 437* 413*   BNP: Invalid input(s): "POCBNP" CBG: Recent Labs    04/06/23 2210 04/07/23 0821 04/07/23 1156  GLUCAP 145* 144* 204*    Procedures and Imaging Studies During Stay: ECHOCARDIOGRAM COMPLETE  Result Date: 03/20/2023    ECHOCARDIOGRAM REPORT   Patient Name:   KYRON DEMINT Date of Exam: 03/20/2023 Medical Rec #:  [id number]      Height:       67.5 in Accession #:    [id number]     Weight:       230.6 lb Date of Birth:   1940/08/26     BSA:          2.160 m Patient Age:    81 years       BP:           167/53 mmHg Patient Gender: M              HR:           97 bpm. Exam Location:  [facility] Procedure: 2D Echo, Cardiac Doppler and Color Doppler Indications:     Dyspnea  History:         Patient has no prior history of Echocardiogram examinations.                  Signs/Symptoms:Dyspnea. Bilateral humeral fractures. Patient                  was not able to lie on his left side. Echo performed with                  patient sitting.  Sonographer:     Mikki Harbor Referring Phys:  [id number] Marcelino Duster Diagnosing Phys: Debbe Odea MD  Sonographer Comments: Technically difficult study due to poor echo windows. IMPRESSIONS  1. Left ventricular ejection fraction, by estimation, is 60 to 65%. The left ventricle has normal function. The left ventricle has no regional wall motion abnormalities. Left ventricular diastolic parameters are consistent with Grade I diastolic dysfunction (impaired relaxation).  2. Right ventricular systolic function is normal. The right ventricular size is mildly enlarged. There is normal pulmonary artery systolic pressure.  3. The mitral valve is normal in structure. Mild mitral valve regurgitation.  4. The aortic valve is calcified. Aortic valve regurgitation is not visualized. Aortic valve sclerosis/calcification is present, without any evidence of aortic stenosis.  5. The inferior vena cava is normal in size with greater than 50% respiratory variability, suggesting right atrial pressure of 3 mmHg. FINDINGS  Left Ventricle: Left ventricular ejection fraction, by estimation, is 60 to 65%. The left ventricle has normal function. The left ventricle has no regional wall motion abnormalities. The left ventricular internal cavity size was normal in size. There is  no left ventricular hypertrophy. Left ventricular diastolic parameters are consistent with Grade I diastolic dysfunction (impaired relaxation).  Right Ventricle: The right ventricular size is mildly enlarged. No increase in right ventricular wall  thickness. Right ventricular systolic function is normal. There is normal pulmonary artery systolic pressure. The tricuspid regurgitant velocity is 1.56  m/s, and with an assumed right atrial pressure of 3 mmHg, the estimated right ventricular systolic pressure is 12.7 mmHg. Left Atrium: Left atrial size was normal in size. Right Atrium: Right atrial size was normal in size. Pericardium: There is no evidence of pericardial effusion. Mitral Valve: The mitral valve is normal in structure. Mild mitral valve regurgitation. MV peak gradient, 7.8 mmHg. The mean mitral valve gradient is 3.0 mmHg. Tricuspid Valve: The tricuspid valve is normal in structure. Tricuspid valve regurgitation is mild. Aortic Valve: The aortic valve is calcified. Aortic valve regurgitation is not visualized. Aortic valve sclerosis/calcification is present, without any evidence of aortic stenosis. Aortic valve mean gradient measures 7.5 mmHg. Aortic valve peak gradient measures 14.1 mmHg. Aortic valve area, by VTI measures 2.88 cm. Pulmonic Valve: The pulmonic valve was not well visualized. Pulmonic valve regurgitation is not visualized. Aorta: The aortic root is normal in size and structure. Venous: The inferior vena cava is normal in size with greater than 50% respiratory variability, suggesting right atrial pressure of 3 mmHg. IAS/Shunts: No atrial level shunt detected by color flow Doppler.  LEFT VENTRICLE PLAX 2D LVIDd:         5.20 cm   Diastology LVIDs:         2.60 cm   LV e' medial:    6.85 cm/s LV PW:         0.80 cm   LV E/e' medial:  12.0 LV IVS:        0.90 cm   LV e' lateral:   10.80 cm/s LVOT diam:     2.00 cm   LV E/e' lateral: 7.6 LV SV:         97 LV SV Index:   45 LVOT Area:     3.14 cm  RIGHT VENTRICLE RV Basal diam:  3.65 cm RV Mid diam:    3.90 cm RV S prime:     22.90 cm/s TAPSE (M-mode): 2.6 cm LEFT ATRIUM              Index        RIGHT ATRIUM           Index LA diam:        4.10 cm 1.90 cm/m   RA Area:     19.10 cm LA Vol (A2C):   61.7 ml 28.56 ml/m  RA Volume:   52.80 ml  24.44 ml/m LA Vol (A4C):   66.7 ml 30.88 ml/m LA Biplane Vol: 65.6 ml 30.37 ml/m  AORTIC VALVE AV Area (Vmax):    2.41 cm AV Area (Vmean):   2.44 cm AV Area (VTI):     2.88 cm AV Vmax:           188.00 cm/s AV Vmean:          125.000 cm/s AV VTI:            0.338 m AV Peak Grad:      14.1 mmHg AV Mean Grad:      7.5 mmHg LVOT Vmax:         144.00 cm/s LVOT Vmean:        97.200 cm/s LVOT VTI:          0.310 m LVOT/AV VTI ratio: 0.92  AORTA Ao Root diam: 3.70 cm MITRAL VALVE  TRICUSPID VALVE MV Area (PHT): 2.56 cm     TR Peak grad:   9.7 mmHg MV Area VTI:   3.80 cm     TR Vmax:        156.00 cm/s MV Peak grad:  7.8 mmHg MV Mean grad:  3.0 mmHg     SHUNTS MV Vmax:       1.40 m/s     Systemic VTI:  0.31 m MV Vmean:      70.3 cm/s    Systemic Diam: 2.00 cm MV Decel Time: 296 msec MV E velocity: 82.30 cm/s MV A velocity: 125.00 cm/s MV E/A ratio:  0.66 Debbe Odea MD Electronically signed by Debbe Odea MD Signature Date/Time: 03/20/2023/12:51:05 PM    Final    DG Chest Port 1 View  Result Date: 03/19/2023 CLINICAL DATA:  Hypoxia EXAM: PORTABLE CHEST 1 VIEW COMPARISON:  03/08/2023 FINDINGS: Bibasilar discoid atelectasis. No pneumothorax or pleural effusion. Cardiac size within normal limits. Pulmonary vascularity is normal. Left total shoulder arthroplasty has been performed. No acute bone abnormality. IMPRESSION: 1. Bibasilar discoid atelectasis. Electronically Signed   By: Helyn Numbers M.D.   On: 03/19/2023 18:57    Assessment/Plan:   Hyponatremia  Closed fracture of both humeri with routine healing, subsequent encounter  Type 2 diabetes mellitus without complication, without long-term current use of insulin (HCC)  Hypomagnesemia  CLL (chronic lymphocytic leukemia) (HCC)  Diarrhea due to  malabsorption  Hypertension, unspecified type Patient has been successful in physical therapy. Sodium at 132 this morning with a goal of keeping levels >130. Currently taking sodium supplementation. F/u with PCP on Monday and nephrology in upcoming weeks. Patient is no longer taking metformin due to potential impact on diarrhea, goal of A1c should be less than 8. Consider discontinuation of pioglitozone given cardiotoxic risk. Patient did not tolerate questran for diarrhea, discontinue all stool softeners and use PRN. CLL stable per most recent labs.  Patient is being discharged with the following home health services:  Physical Therapy  Patient is being discharged with the following durable medical equipment:  Walker  Patient has been advised to f/u with their PCP in 1-2 weeks to for a transitions of care visit.  Social services at their facility was responsible for arranging this appointment.  Pt was provided with adequate prescriptions of noncontrolled medications to reach the scheduled appointment .  For controlled substances, a limited supply was provided as appropriate for the individual patient.  If the pt normally receives these medications from a pain clinic or has a contract with another physician, these medications should be received from that clinic or physician only).     Future labs/tests needed:  BMP and CBC 10/7   I spent greater than 30 minutes for the care of this patient in face to face time, chart review, clinical documentation, patient education.

## 2023-04-18 ENCOUNTER — Encounter: Payer: Self-pay | Admitting: Student

## 2023-04-30 ENCOUNTER — Other Ambulatory Visit: Payer: Self-pay | Admitting: Orthopedic Surgery

## 2023-05-01 ENCOUNTER — Inpatient Hospital Stay
Admission: RE | Admit: 2023-05-01 | Discharge: 2023-05-02 | DRG: 483 | Disposition: A | Discharge status: Home Health | Payer: Medicare Other | Attending: Orthopedic Surgery | Admitting: Orthopedic Surgery

## 2023-05-01 ENCOUNTER — Other Ambulatory Visit: Payer: Self-pay

## 2023-05-01 ENCOUNTER — Inpatient Hospital Stay: Payer: Medicare Other

## 2023-05-01 ENCOUNTER — Inpatient Hospital Stay: Payer: Medicare Other | Admitting: Certified Registered"

## 2023-05-01 ENCOUNTER — Encounter: Payer: Self-pay | Admitting: Orthopedic Surgery

## 2023-05-01 ENCOUNTER — Encounter
Admission: RE | Disposition: A | Discharge status: Home Health | Payer: Self-pay | Source: Home / Self Care | Attending: Orthopedic Surgery

## 2023-05-01 DIAGNOSIS — Z842 Family history of other diseases of the genitourinary system: Secondary | ICD-10-CM

## 2023-05-01 DIAGNOSIS — Z7982 Long term (current) use of aspirin: Secondary | ICD-10-CM

## 2023-05-01 DIAGNOSIS — Z79899 Other long term (current) drug therapy: Secondary | ICD-10-CM | POA: Diagnosis not present

## 2023-05-01 DIAGNOSIS — Z8619 Personal history of other infectious and parasitic diseases: Secondary | ICD-10-CM | POA: Diagnosis not present

## 2023-05-01 DIAGNOSIS — Z7984 Long term (current) use of oral hypoglycemic drugs: Secondary | ICD-10-CM

## 2023-05-01 DIAGNOSIS — Y792 Prosthetic and other implants, materials and accessory orthopedic devices associated with adverse incidents: Secondary | ICD-10-CM | POA: Diagnosis present

## 2023-05-01 DIAGNOSIS — Z96612 Presence of left artificial shoulder joint: Secondary | ICD-10-CM | POA: Diagnosis present

## 2023-05-01 DIAGNOSIS — E119 Type 2 diabetes mellitus without complications: Secondary | ICD-10-CM | POA: Diagnosis present

## 2023-05-01 DIAGNOSIS — I5032 Chronic diastolic (congestive) heart failure: Secondary | ICD-10-CM | POA: Diagnosis present

## 2023-05-01 DIAGNOSIS — C9111 Chronic lymphocytic leukemia of B-cell type in remission: Secondary | ICD-10-CM | POA: Diagnosis present

## 2023-05-01 DIAGNOSIS — E785 Hyperlipidemia, unspecified: Secondary | ICD-10-CM | POA: Diagnosis present

## 2023-05-01 DIAGNOSIS — Z833 Family history of diabetes mellitus: Secondary | ICD-10-CM | POA: Diagnosis not present

## 2023-05-01 DIAGNOSIS — E039 Hypothyroidism, unspecified: Secondary | ICD-10-CM | POA: Diagnosis present

## 2023-05-01 DIAGNOSIS — N400 Enlarged prostate without lower urinary tract symptoms: Secondary | ICD-10-CM | POA: Diagnosis present

## 2023-05-01 DIAGNOSIS — Z803 Family history of malignant neoplasm of breast: Secondary | ICD-10-CM | POA: Diagnosis not present

## 2023-05-01 DIAGNOSIS — Z79890 Hormone replacement therapy (postmenopausal): Secondary | ICD-10-CM | POA: Diagnosis not present

## 2023-05-01 DIAGNOSIS — Z9049 Acquired absence of other specified parts of digestive tract: Secondary | ICD-10-CM

## 2023-05-01 DIAGNOSIS — T84028A Dislocation of other internal joint prosthesis, initial encounter: Principal | ICD-10-CM | POA: Diagnosis present

## 2023-05-01 DIAGNOSIS — Z01818 Encounter for other preprocedural examination: Principal | ICD-10-CM

## 2023-05-01 HISTORY — PX: SHOULDER CLOSED REDUCTION: SHX1051

## 2023-05-01 HISTORY — PX: REVISION TOTAL SHOULDER TO REVERSE TOTAL SHOULDER: SHX6313

## 2023-05-01 LAB — URINALYSIS, ROUTINE W REFLEX MICROSCOPIC
Bacteria, UA: NONE SEEN
Bilirubin Urine: NEGATIVE
Glucose, UA: NEGATIVE mg/dL
Hgb urine dipstick: NEGATIVE
Ketones, ur: NEGATIVE mg/dL
Nitrite: NEGATIVE
Protein, ur: NEGATIVE mg/dL
Specific Gravity, Urine: 1.023 (ref 1.005–1.030)
Squamous Epithelial / HPF: 0 /[HPF] (ref 0–5)
pH: 5 (ref 5.0–8.0)

## 2023-05-01 LAB — COMPREHENSIVE METABOLIC PANEL
ALT: 13 U/L (ref 0–44)
AST: 16 U/L (ref 15–41)
Albumin: 3.8 g/dL (ref 3.5–5.0)
Alkaline Phosphatase: 62 U/L (ref 38–126)
Anion gap: 12 (ref 5–15)
BUN: 11 mg/dL (ref 8–23)
CO2: 21 mmol/L — ABNORMAL LOW (ref 22–32)
Calcium: 9.4 mg/dL (ref 8.9–10.3)
Chloride: 101 mmol/L (ref 98–111)
Creatinine, Ser: 0.69 mg/dL (ref 0.61–1.24)
GFR, Estimated: >60 mL/min (ref >60)
Glucose, Bld: 160 mg/dL — ABNORMAL HIGH (ref 70–99)
Potassium: 4.4 mmol/L (ref 3.5–5.1)
Sodium: 134 mmol/L — ABNORMAL LOW (ref 135–145)
Total Bilirubin: 0.7 mg/dL (ref 0.3–1.2)
Total Protein: 7 g/dL (ref 6.5–8.1)

## 2023-05-01 LAB — CBC WITH DIFFERENTIAL/PLATELET
Abs Immature Granulocytes: 0.08 10*3/uL — ABNORMAL HIGH (ref 0.00–0.07)
Basophils Absolute: 0.1 10*3/uL (ref 0.0–0.1)
Basophils Relative: 1 %
Eosinophils Absolute: 0.4 10*3/uL (ref 0.0–0.5)
Eosinophils Relative: 2 %
HCT: 38.5 % — ABNORMAL LOW (ref 39.0–52.0)
Hemoglobin: 11.8 g/dL — ABNORMAL LOW (ref 13.0–17.0)
Immature Granulocytes: 0 %
Lymphocytes Relative: 64 %
Lymphs Abs: 15.7 10*3/uL — ABNORMAL HIGH (ref 0.7–4.0)
MCH: 28.3 pg (ref 26.0–34.0)
MCHC: 30.6 g/dL (ref 30.0–36.0)
MCV: 92.3 fL (ref 80.0–100.0)
Monocytes Absolute: 1 10*3/uL (ref 0.1–1.0)
Monocytes Relative: 4 %
Neutro Abs: 7.2 10*3/uL (ref 1.7–7.7)
Neutrophils Relative %: 29 %
Platelets: 610 10*3/uL — ABNORMAL HIGH (ref 150–400)
RBC: 4.17 MIL/uL — ABNORMAL LOW (ref 4.22–5.81)
RDW: 15.5 % (ref 11.5–15.5)
Smear Review: NORMAL
WBC: 24.7 10*3/uL — ABNORMAL HIGH (ref 4.0–10.5)
nRBC: 0 % (ref 0.0–0.2)

## 2023-05-01 LAB — TYPE AND SCREEN
ABO/RH(D): O POS
Antibody Screen: NEGATIVE

## 2023-05-01 LAB — GLUCOSE, CAPILLARY
Glucose-Capillary: 147 mg/dL — ABNORMAL HIGH (ref 70–99)
Glucose-Capillary: 166 mg/dL — ABNORMAL HIGH (ref 70–99)

## 2023-05-01 SURGERY — CLOSED REDUCTION, SHOULDER
Anesthesia: General | Site: Shoulder | Laterality: Right

## 2023-05-01 MED ORDER — METOCLOPRAMIDE HCL 5 MG PO TABS: 5.0000 mg | ORAL_TABLET | Freq: Three times a day (TID) | ORAL | Status: DC | PRN

## 2023-05-01 MED ORDER — PROPOFOL 500 MG/50ML IV EMUL
INTRAVENOUS | Status: DC | PRN
Start: 2023-05-01 — End: 2023-05-01
  Administered 2023-05-01: 150 ug/kg/min via INTRAVENOUS

## 2023-05-01 MED ORDER — OXYCODONE HCL 5 MG PO TABS: 10.0000 mg | ORAL_TABLET | ORAL | Status: DC | PRN

## 2023-05-01 MED ORDER — BISACODYL 10 MG RE SUPP: 10.0000 mg | Freq: Every day | RECTAL | Status: DC | PRN

## 2023-05-01 MED ORDER — SODIUM CHLORIDE 0.9 % IV SOLN: INTRAVENOUS | Status: AC

## 2023-05-01 MED ORDER — SENNOSIDES-DOCUSATE SODIUM 8.6-50 MG PO TABS: 1.0000 | ORAL_TABLET | Freq: Every evening | ORAL | Status: DC | PRN

## 2023-05-01 MED ORDER — LIDOCAINE HCL (PF) 2 % IJ SOLN
INTRAMUSCULAR | Status: DC | PRN
Start: 2023-05-01 — End: 2023-05-01
  Administered 2023-05-01: 60 mg via INTRADERMAL

## 2023-05-01 MED ORDER — CHLORHEXIDINE GLUCONATE 0.12 % MT SOLN
15.0000 mL | Freq: Once | OROMUCOSAL | Status: AC
  Administered 2023-05-01: 15 mL via OROMUCOSAL

## 2023-05-01 MED ORDER — MELATONIN 5 MG PO TABS: 5.0000 mg | ORAL_TABLET | Freq: Every day | ORAL | Status: DC

## 2023-05-01 MED ORDER — TRANEXAMIC ACID-NACL 1000-0.7 MG/100ML-% IV SOLN
INTRAVENOUS | Status: AC
  Filled 2023-05-01: qty 100

## 2023-05-01 MED ORDER — VITAMIN B-12 1000 MCG PO TABS
1000.0000 ug | ORAL_TABLET | Freq: Every day | ORAL | Status: DC
  Administered 2023-05-01 – 2023-05-02 (×2): 1000 ug via ORAL
  Filled 2023-05-01 (×2): qty 1

## 2023-05-01 MED ORDER — POLYSACCHARIDE IRON COMPLEX 150 MG PO CAPS: 150.0000 mg | ORAL_CAPSULE | Freq: Every day | ORAL | Status: DC

## 2023-05-01 MED ORDER — LIDOCAINE HCL (PF) 1 % IJ SOLN
INTRAMUSCULAR | Status: AC
  Filled 2023-05-01: qty 5

## 2023-05-01 MED ORDER — FOLIC ACID 1 MG PO TABS
1.0000 mg | ORAL_TABLET | Freq: Every day | ORAL | Status: DC
  Administered 2023-05-02: 1 mg via ORAL
  Filled 2023-05-01: qty 1

## 2023-05-01 MED ORDER — PHENOL 1.4 % MT LIQD: 1.0000 | OROMUCOSAL | Status: DC | PRN

## 2023-05-01 MED ORDER — BUPIVACAINE HCL (PF) 0.5 % IJ SOLN
INTRAMUSCULAR | Status: AC
  Filled 2023-05-01: qty 10

## 2023-05-01 MED ORDER — CEFAZOLIN SODIUM-DEXTROSE 2-4 GM/100ML-% IV SOLN
2.0000 g | Freq: Four times a day (QID) | INTRAVENOUS | Status: AC
  Administered 2023-05-01 – 2023-05-02 (×3): 2 g via INTRAVENOUS
  Filled 2023-05-01 (×3): qty 100

## 2023-05-01 MED ORDER — CEFAZOLIN SODIUM-DEXTROSE 2-4 GM/100ML-% IV SOLN
INTRAVENOUS | Status: AC
  Filled 2023-05-01: qty 100

## 2023-05-01 MED ORDER — FLUTICASONE PROPIONATE 50 MCG/ACT NA SUSP
2.0000 | Freq: Every evening | NASAL | Status: DC
  Filled 2023-05-01: qty 16

## 2023-05-01 MED ORDER — SODIUM CHLORIDE 0.9 % IR SOLN
Status: DC | PRN
  Administered 2023-05-01: 1000 mL

## 2023-05-01 MED ORDER — LIDOCAINE HCL (PF) 2 % IJ SOLN
INTRAMUSCULAR | Status: AC
  Filled 2023-05-01: qty 5

## 2023-05-01 MED ORDER — PROPOFOL 10 MG/ML IV BOLUS
INTRAVENOUS | Status: DC | PRN
Start: 2023-05-01 — End: 2023-05-01
  Administered 2023-05-01: 50 mg via INTRAVENOUS

## 2023-05-01 MED ORDER — SALINE SPRAY 0.65 % NA SOLN: 2.0000 | NASAL | Status: DC | PRN

## 2023-05-01 MED ORDER — ALUM & MAG HYDROXIDE-SIMETH 200-200-20 MG/5ML PO SUSP: 30.0000 mL | ORAL | Status: DC | PRN

## 2023-05-01 MED ORDER — ASPIRIN 325 MG PO TBEC
325.0000 mg | DELAYED_RELEASE_TABLET | Freq: Every day | ORAL | Status: DC
  Administered 2023-05-02: 325 mg via ORAL
  Filled 2023-05-01: qty 1

## 2023-05-01 MED ORDER — CEFAZOLIN SODIUM-DEXTROSE 2-4 GM/100ML-% IV SOLN
2.0000 g | INTRAVENOUS | Status: AC
  Administered 2023-05-01: 2 g via INTRAVENOUS

## 2023-05-01 MED ORDER — CHLORHEXIDINE GLUCONATE 0.12 % MT SOLN
OROMUCOSAL | Status: AC
  Filled 2023-05-01: qty 15

## 2023-05-01 MED ORDER — TAMSULOSIN HCL 0.4 MG PO CAPS
0.4000 mg | ORAL_CAPSULE | Freq: Every day | ORAL | Status: DC
  Administered 2023-05-01: 0.4 mg via ORAL
  Filled 2023-05-01: qty 1

## 2023-05-01 MED ORDER — AMLODIPINE BESYLATE 5 MG PO TABS
5.0000 mg | ORAL_TABLET | Freq: Every day | ORAL | Status: DC
  Administered 2023-05-01 – 2023-05-02 (×2): 5 mg via ORAL
  Filled 2023-05-01 (×2): qty 1

## 2023-05-01 MED ORDER — SUCCINYLCHOLINE CHLORIDE 200 MG/10ML IV SOSY
PREFILLED_SYRINGE | INTRAVENOUS | Status: DC | PRN
Start: 2023-05-01 — End: 2023-05-01
  Administered 2023-05-01: 120 mg via INTRAVENOUS

## 2023-05-01 MED ORDER — DOCUSATE SODIUM 100 MG PO CAPS: 100.0000 mg | ORAL_CAPSULE | Freq: Two times a day (BID) | ORAL | Status: DC

## 2023-05-01 MED ORDER — METOCLOPRAMIDE HCL 5 MG/ML IJ SOLN: 5.0000 mg | Freq: Three times a day (TID) | INTRAMUSCULAR | Status: DC | PRN

## 2023-05-01 MED ORDER — ORAL CARE MOUTH RINSE: 15.0000 mL | Freq: Once | OROMUCOSAL | Status: AC

## 2023-05-01 MED ORDER — MENTHOL 3 MG MT LOZG: 1.0000 | LOZENGE | OROMUCOSAL | Status: DC | PRN

## 2023-05-01 MED ORDER — SUGAMMADEX SODIUM 200 MG/2ML IV SOLN
INTRAVENOUS | Status: DC | PRN
Start: 2023-05-01 — End: 2023-05-01
  Administered 2023-05-01: 200 mg via INTRAVENOUS

## 2023-05-01 MED ORDER — PHENYLEPHRINE HCL (PRESSORS) 10 MG/ML IV SOLN
INTRAVENOUS | Status: DC | PRN
Start: 2023-05-01 — End: 2023-05-01
  Administered 2023-05-01: 200 ug via INTRAVENOUS
  Administered 2023-05-01 (×2): 100 ug via INTRAVENOUS

## 2023-05-01 MED ORDER — ONDANSETRON HCL 4 MG/2ML IJ SOLN
INTRAMUSCULAR | Status: DC | PRN
Start: 2023-05-01 — End: 2023-05-01
  Administered 2023-05-01: 4 mg via INTRAVENOUS

## 2023-05-01 MED ORDER — BUPIVACAINE LIPOSOME 1.3 % IJ SUSP
INTRAMUSCULAR | Status: AC
  Filled 2023-05-01: qty 20

## 2023-05-01 MED ORDER — ROCURONIUM BROMIDE 100 MG/10ML IV SOLN
INTRAVENOUS | Status: DC | PRN
Start: 2023-05-01 — End: 2023-05-01
  Administered 2023-05-01: 50 mg via INTRAVENOUS
  Administered 2023-05-01 (×2): 10 mg via INTRAVENOUS

## 2023-05-01 MED ORDER — TRANEXAMIC ACID-NACL 1000-0.7 MG/100ML-% IV SOLN
1000.0000 mg | INTRAVENOUS | Status: AC
  Administered 2023-05-01: 1000 mg via INTRAVENOUS

## 2023-05-01 MED ORDER — DEXAMETHASONE SODIUM PHOSPHATE 10 MG/ML IJ SOLN
INTRAMUSCULAR | Status: AC
  Filled 2023-05-01: qty 1

## 2023-05-01 MED ORDER — VANCOMYCIN HCL 1000 MG IV SOLR
INTRAVENOUS | Status: DC | PRN
  Administered 2023-05-01: 1000 mg

## 2023-05-01 MED ORDER — STERILE WATER FOR IRRIGATION IR SOLN
Status: DC | PRN
Start: 2023-05-01 — End: 2023-05-01
  Administered 2023-05-01: 1000 mL

## 2023-05-01 MED ORDER — BUPIVACAINE-EPINEPHRINE (PF) 0.25% -1:200000 IJ SOLN
INTRAMUSCULAR | Status: AC
  Filled 2023-05-01: qty 30

## 2023-05-01 MED ORDER — ACETAMINOPHEN 500 MG PO TABS
1000.0000 mg | ORAL_TABLET | Freq: Three times a day (TID) | ORAL | Status: DC
  Administered 2023-05-01 – 2023-05-02 (×3): 1000 mg via ORAL
  Filled 2023-05-01 (×3): qty 2

## 2023-05-01 MED ORDER — VITAMIN C 500 MG PO TABS
500.0000 mg | ORAL_TABLET | Freq: Every day | ORAL | Status: DC
  Administered 2023-05-02: 500 mg via ORAL
  Filled 2023-05-01: qty 1

## 2023-05-01 MED ORDER — LACTATED RINGERS IV SOLN: INTRAVENOUS | Status: DC

## 2023-05-01 MED ORDER — FENTANYL CITRATE (PF) 100 MCG/2ML IJ SOLN
INTRAMUSCULAR | Status: DC | PRN
Start: 2023-05-01 — End: 2023-05-01
  Administered 2023-05-01 (×4): 25 ug via INTRAVENOUS

## 2023-05-01 MED ORDER — ROCURONIUM BROMIDE 10 MG/ML (PF) SYRINGE
PREFILLED_SYRINGE | INTRAVENOUS | Status: AC
  Filled 2023-05-01: qty 10

## 2023-05-01 MED ORDER — DEXAMETHASONE SODIUM PHOSPHATE 10 MG/ML IJ SOLN
INTRAMUSCULAR | Status: DC | PRN
Start: 2023-05-01 — End: 2023-05-01
  Administered 2023-05-01: 5 mg via INTRAVENOUS

## 2023-05-01 MED ORDER — PIOGLITAZONE HCL 30 MG PO TABS
45.0000 mg | ORAL_TABLET | Freq: Every day | ORAL | Status: DC
  Administered 2023-05-01 – 2023-05-02 (×2): 45 mg via ORAL
  Filled 2023-05-01 (×2): qty 1

## 2023-05-01 MED ORDER — HYDROMORPHONE HCL 1 MG/ML IJ SOLN
INTRAMUSCULAR | Status: DC | PRN
Start: 2023-05-01 — End: 2023-05-01
  Administered 2023-05-01 (×2): .5 mg via INTRAVENOUS

## 2023-05-01 MED ORDER — VASOPRESSIN 20 UNIT/ML IV SOLN
INTRAVENOUS | Status: DC | PRN
Start: 2023-05-01 — End: 2023-05-01
  Administered 2023-05-01: 1 [IU] via INTRAVENOUS

## 2023-05-01 MED ORDER — OXYCODONE HCL 5 MG PO TABS: 5.0000 mg | ORAL_TABLET | ORAL | Status: DC | PRN

## 2023-05-01 MED ORDER — VASOPRESSIN 20 UNIT/ML IV SOLN
INTRAVENOUS | Status: AC
  Filled 2023-05-01: qty 1

## 2023-05-01 MED ORDER — HYDROMORPHONE HCL 1 MG/ML IJ SOLN: 0.2000 mg | INTRAMUSCULAR | Status: DC | PRN

## 2023-05-01 MED ORDER — PHENYLEPHRINE HCL-NACL 20-0.9 MG/250ML-% IV SOLN
INTRAVENOUS | Status: AC
  Filled 2023-05-01: qty 250

## 2023-05-01 MED ORDER — FENTANYL CITRATE (PF) 100 MCG/2ML IJ SOLN
INTRAMUSCULAR | Status: AC
  Filled 2023-05-01: qty 2

## 2023-05-01 MED ORDER — OXYMETAZOLINE HCL 0.05 % NA SOLN
1.0000 | Freq: Two times a day (BID) | NASAL | Status: DC
  Filled 2023-05-01: qty 15

## 2023-05-01 MED ORDER — TRANEXAMIC ACID-NACL 1000-0.7 MG/100ML-% IV SOLN
1000.0000 mg | Freq: Once | INTRAVENOUS | Status: AC
  Administered 2023-05-01: 1000 mg via INTRAVENOUS

## 2023-05-01 MED ORDER — HYDROMORPHONE HCL 1 MG/ML IJ SOLN
INTRAMUSCULAR | Status: AC
  Filled 2023-05-01: qty 1

## 2023-05-01 MED ORDER — VANCOMYCIN HCL 1000 MG IV SOLR
INTRAVENOUS | Status: AC
  Filled 2023-05-01: qty 20

## 2023-05-01 MED ORDER — ONDANSETRON HCL 4 MG PO TABS: 4.0000 mg | ORAL_TABLET | Freq: Four times a day (QID) | ORAL | Status: DC | PRN

## 2023-05-01 MED ORDER — ONDANSETRON HCL 4 MG/2ML IJ SOLN
INTRAMUSCULAR | Status: AC
  Filled 2023-05-01: qty 2

## 2023-05-01 MED ORDER — METFORMIN HCL 500 MG PO TABS
1000.0000 mg | ORAL_TABLET | Freq: Two times a day (BID) | ORAL | Status: DC
  Filled 2023-05-01: qty 2

## 2023-05-01 MED ORDER — PROPOFOL 1000 MG/100ML IV EMUL
INTRAVENOUS | Status: AC
  Filled 2023-05-01: qty 100

## 2023-05-01 MED ORDER — EPHEDRINE SULFATE (PRESSORS) 50 MG/ML IJ SOLN
INTRAMUSCULAR | Status: DC | PRN
  Administered 2023-05-01: 10 mg via INTRAVENOUS
  Administered 2023-05-01: 5 mg via INTRAVENOUS
  Administered 2023-05-01: 10 mg via INTRAVENOUS

## 2023-05-01 MED ORDER — CHOLESTYRAMINE 4 G PO PACK
4.0000 g | PACK | Freq: Three times a day (TID) | ORAL | Status: DC
  Filled 2023-05-01: qty 1

## 2023-05-01 MED ORDER — LINAGLIPTIN 5 MG PO TABS
5.0000 mg | ORAL_TABLET | Freq: Every day | ORAL | Status: DC
  Administered 2023-05-02: 5 mg via ORAL
  Filled 2023-05-01 (×2): qty 1

## 2023-05-01 MED ORDER — LEVOTHYROXINE SODIUM 75 MCG PO TABS
75.0000 ug | ORAL_TABLET | Freq: Every day | ORAL | Status: DC
  Administered 2023-05-02: 75 ug via ORAL
  Filled 2023-05-01: qty 1
  Filled 2023-05-01: qty 3

## 2023-05-01 MED ORDER — PHENYLEPHRINE HCL-NACL 20-0.9 MG/250ML-% IV SOLN
INTRAVENOUS | Status: DC | PRN
Start: 2023-05-01 — End: 2023-05-01
  Administered 2023-05-01: 20 ug/min via INTRAVENOUS

## 2023-05-01 MED ORDER — FENTANYL CITRATE (PF) 100 MCG/2ML IJ SOLN: 25.0000 ug | INTRAMUSCULAR | Status: DC | PRN

## 2023-05-01 MED ORDER — VITAMIN D (ERGOCALCIFEROL) 1.25 MG (50000 UNIT) PO CAPS: 50000.0000 [IU] | ORAL_CAPSULE | ORAL | Status: DC

## 2023-05-01 MED ORDER — PROPOFOL 10 MG/ML IV BOLUS
INTRAVENOUS | Status: AC
  Filled 2023-05-01: qty 20

## 2023-05-01 MED ORDER — LISINOPRIL 10 MG PO TABS
10.0000 mg | ORAL_TABLET | Freq: Every day | ORAL | Status: DC
  Administered 2023-05-01 – 2023-05-02 (×2): 10 mg via ORAL
  Filled 2023-05-01 (×2): qty 1

## 2023-05-01 MED ORDER — ONDANSETRON HCL 4 MG/2ML IJ SOLN: 4.0000 mg | Freq: Four times a day (QID) | INTRAMUSCULAR | Status: DC | PRN

## 2023-05-01 MED ORDER — ATORVASTATIN CALCIUM 20 MG PO TABS
40.0000 mg | ORAL_TABLET | Freq: Every day | ORAL | Status: DC
  Administered 2023-05-02: 40 mg via ORAL
  Filled 2023-05-01: qty 2

## 2023-05-01 SURGICAL SUPPLY — 75 items
ADH SKN CLS APL DERMABOND .7 (GAUZE/BANDAGES/DRESSINGS)
ANCH SUT .5 CRC TPR CT 40X40 (SUTURE)
ANCH SUT 1.4 SUT TPE BLK/WHT (SUTURE)
APL PRP STRL LF DISP 70% ISPRP (MISCELLANEOUS) ×1
BLADE SAGITTAL WIDE XTHICK NO (BLADE) ×1 IMPLANT
CHLORAPREP W/TINT 26 (MISCELLANEOUS) ×1 IMPLANT
CNTNR URN SCR LID CUP LEK RST (MISCELLANEOUS) IMPLANT
CONT SPEC 4OZ STRL OR WHT (MISCELLANEOUS) ×5
COOLER POLAR GLACIER W/PUMP (MISCELLANEOUS) ×1 IMPLANT
COVER LIGHT HANDLE STERIS (MISCELLANEOUS) IMPLANT
DERMABOND ADVANCED .7 DNX12 (GAUZE/BANDAGES/DRESSINGS) IMPLANT
DRAPE INCISE IOBAN 66X45 STRL (DRAPES) ×2 IMPLANT
DRAPE SHEET LG 3/4 BI-LAMINATE (DRAPES) ×2 IMPLANT
DRAPE TABLE BACK 80X90 (DRAPES) ×1 IMPLANT
DRAPE U-SHAPE 47X51 STRL (DRAPES) ×1 IMPLANT
DRSG OPSITE POSTOP 3X4 (GAUZE/BANDAGES/DRESSINGS) IMPLANT
DRSG OPSITE POSTOP 4X6 (GAUZE/BANDAGES/DRESSINGS) IMPLANT
DRSG OPSITE POSTOP 4X8 (GAUZE/BANDAGES/DRESSINGS) IMPLANT
DRSG TEGADERM 2-3/8X2-3/4 SM (GAUZE/BANDAGES/DRESSINGS) IMPLANT
ELECT REM PT RETURN 9FT ADLT (ELECTROSURGICAL) ×1
ELECTRODE REM PT RTRN 9FT ADLT (ELECTROSURGICAL) ×1 IMPLANT
EVACUATOR 1/8 PVC DRAIN (DRAIN) IMPLANT
GAUZE SPONGE 2X2 STRL 8-PLY (GAUZE/BANDAGES/DRESSINGS) IMPLANT
GAUZE XEROFORM 1X8 LF (GAUZE/BANDAGES/DRESSINGS) IMPLANT
GLENOSPHERE CONE AETOS 38 RSS (Shoulder) IMPLANT
GLOVE BIOGEL PI IND STRL 8 (GLOVE) ×2 IMPLANT
GLOVE PI ULTRA LF STRL 7.5 (GLOVE) ×2 IMPLANT
GLOVE SURG ORTHO 8.0 STRL STRW (GLOVE) ×2 IMPLANT
GLOVE SURG SYN 8.0 (GLOVE) ×1 IMPLANT
GLOVE SURG SYN 8.0 PF PI (GLOVE) ×1 IMPLANT
GOWN STRL REUS W/ TWL LRG LVL3 (GOWN DISPOSABLE) ×2 IMPLANT
GOWN STRL REUS W/ TWL XL LVL3 (GOWN DISPOSABLE) ×1 IMPLANT
GOWN STRL REUS W/TWL LRG LVL3 (GOWN DISPOSABLE) ×2
GOWN STRL REUS W/TWL XL LVL3 (GOWN DISPOSABLE) ×1
HOOD PEEL AWAY T7 (MISCELLANEOUS) ×2 IMPLANT
LINER STD +6R RSS HXL (Liner) IMPLANT
MANIFOLD NEPTUNE II (INSTRUMENTS) ×1 IMPLANT
MASK FACE SPIDER DISP (MASK) ×1 IMPLANT
MAT ABSORB FLUID 56X50 GRAY (MISCELLANEOUS) ×1 IMPLANT
NDL MAYO 6 CRC TAPER PT (NEEDLE) IMPLANT
NDL MAYO CATGUT SZ4 TPR NDL (NEEDLE) IMPLANT
NDL REVERSE CUT 1/2 CRC (NEEDLE) IMPLANT
NDL SPNL 20GX3.5 QUINCKE YW (NEEDLE) IMPLANT
NEEDLE MAYO 6 CRC TAPER PT (NEEDLE) ×1 IMPLANT
NEEDLE MAYO CATGUT SZ4 (NEEDLE) ×1 IMPLANT
NEEDLE REVERSE CUT 1/2 CRC (NEEDLE) ×1 IMPLANT
NEEDLE SPNL 20GX3.5 QUINCKE YW (NEEDLE) IMPLANT
NS IRRIG 1000ML POUR BTL (IV SOLUTION) ×1 IMPLANT
PACK ARTHROSCOPY SHOULDER (MISCELLANEOUS) ×1 IMPLANT
PAD WRAPON POLAR SHDR XLG (MISCELLANEOUS) ×1 IMPLANT
PULSAVAC PLUS IRRIG FAN TIP (DISPOSABLE) ×1
SLING ULTRA II LG (MISCELLANEOUS) IMPLANT
SLING ULTRA II M (MISCELLANEOUS) IMPLANT
SPONGE T-LAP 18X18 ~~LOC~~+RFID (SPONGE) ×1 IMPLANT
STAPLER SKIN PROX 35W (STAPLE) IMPLANT
STRAP SAFETY 5IN WIDE (MISCELLANEOUS) ×1 IMPLANT
SUT ETHIBOND 5-0 MS/4 CCS GRN (SUTURE) ×1
SUT FIBERWIRE #2 38 BLUE 1/2 (SUTURE) ×3
SUT MNCRL AB 4-0 PS2 18 (SUTURE) IMPLANT
SUT PROLENE 6 0 P 1 18 (SUTURE) IMPLANT
SUT TICRON 2-0 30IN 311381 (SUTURE) ×2 IMPLANT
SUT VIC AB 0 CT1 36 (SUTURE) ×1 IMPLANT
SUT VIC AB 2-0 CT2 27 (SUTURE) ×2 IMPLANT
SUT XBRAID 1.4 BLK/WHT (SUTURE) IMPLANT
SUT XBRAID 1.4 BLUE (SUTURE) IMPLANT
SUT XBRAID 1.4 WHITE/BLUE (SUTURE) IMPLANT
SUT XBRAID 2 BLACK/BLUE (SUTURE) IMPLANT
SUTURE ETHBND 5-0 MS/4 CCS GRN (SUTURE) ×1 IMPLANT
SUTURE FIBERWR #2 38 BLUE 1/2 (SUTURE) ×1 IMPLANT
SWAB CULTURE AMIES ANAERIB BLU (MISCELLANEOUS) IMPLANT
SYR 30ML LL (SYRINGE) IMPLANT
TIP FAN IRRIG PULSAVAC PLUS (DISPOSABLE) ×1 IMPLANT
TRAP FLUID SMOKE EVACUATOR (MISCELLANEOUS) ×1 IMPLANT
WATER STERILE IRR 500ML POUR (IV SOLUTION) ×1 IMPLANT
WRAPON POLAR PAD SHDR XLG (MISCELLANEOUS) ×1

## 2023-05-01 NOTE — H&P (Signed)
Paper H&P to be scanned into permanent record. H&P reviewed. No significant changes noted.  

## 2023-05-01 NOTE — Plan of Care (Signed)
  Problem: Education: Goal: Understanding of post-operative needs will improve Outcome: Progressing Goal: Individualized Educational Video(s) Outcome: Progressing   Problem: Clinical Measurements: Goal: Postoperative complications will be avoided or minimized Outcome: Progressing   Problem: Respiratory: Goal: Will regain and/or maintain adequate ventilation Outcome: Progressing   Problem: Education: Goal: Knowledge of General Education information will improve Description: Including pain rating scale, medication(s)/side effects and non-pharmacologic comfort measures Outcome: Progressing   Problem: Health Behavior/Discharge Planning: Goal: Ability to manage health-related needs will improve Outcome: Progressing   Problem: Clinical Measurements: Goal: Ability to maintain clinical measurements within normal limits will improve Outcome: Progressing Goal: Will remain free from infection Outcome: Progressing Goal: Diagnostic test results will improve Outcome: Progressing Goal: Respiratory complications will improve Outcome: Progressing Goal: Cardiovascular complication will be avoided Outcome: Progressing   Problem: Activity: Goal: Risk for activity intolerance will decrease Outcome: Progressing   Problem: Nutrition: Goal: Adequate nutrition will be maintained Outcome: Progressing   Problem: Coping: Goal: Level of anxiety will decrease Outcome: Progressing   Problem: Elimination: Goal: Will not experience complications related to bowel motility Outcome: Progressing Goal: Will not experience complications related to urinary retention Outcome: Progressing   Problem: Pain Managment: Goal: General experience of comfort will improve Outcome: Progressing   Problem: Safety: Goal: Ability to remain free from injury will improve Outcome: Progressing   Problem: Skin Integrity: Goal: Risk for impaired skin integrity will decrease Outcome: Progressing   Problem:  Education: Goal: Knowledge of the prescribed therapeutic regimen will improve Outcome: Progressing Goal: Understanding of activity limitations/precautions following surgery will improve Outcome: Progressing Goal: Individualized Educational Video(s) Outcome: Progressing   Problem: Activity: Goal: Ability to tolerate increased activity will improve Outcome: Progressing   Problem: Pain Management: Goal: Pain level will decrease with appropriate interventions Outcome: Progressing

## 2023-05-01 NOTE — Evaluation (Signed)
Physical Therapy Evaluation Patient Details Name: Philip Robbins MRN: [id number] DOB: 1940-09-28 Today's Date: 05/01/2023  History of Present Illness  Pt is an 82 y.o male s/p revision of R shoulder arthroplasty with upsizing of glenoid and humeral components 05/01/23 d/t instability of R reverse shoulder arthroplasty.  PMH includes B reverse TSA's, UTI, CLL under surveillance, BPH, htn, IIDM, hypothyroidism, HLD, chronic leukocytosis.  Clinical Impression  Prior to surgery, pt was ambulatory; has been NWB'ing B UE's since B UE surgeries (family assists pt d/t this); plan to discharge to his daughter's home (1 level with 6 STE).   2/10 R shoulder pain during session. Currently pt is SBA with transfers and CGA with ambulation 180 feet (no AD use).  Mild increased B lateral sway noted with ambulation but no loss of balance noted.  Pt would currently benefit from skilled PT to address noted impairments and functional limitations (see below for any additional details).  Upon hospital discharge, pt would benefit from ongoing therapy.     If plan is discharge home, recommend the following: A little help with walking and/or transfers;A little help with bathing/dressing/bathroom;Assistance with cooking/housework;Assistance with feeding;Assist for transportation;Help with stairs or ramp for entrance   Can travel by private vehicle    Yes    Equipment Recommendations None recommended by PT  Recommendations for Other Services       Functional Status Assessment Patient has had a recent decline in their functional status and demonstrates the ability to make significant improvements in function in a reasonable and predictable amount of time.     Precautions / Restrictions Precautions Precautions: Fall;Shoulder Shoulder Interventions: Shoulder sling/immobilizer;At all times;Off for dressing/bathing/exercises Precaution Comments: Per op note: "Operative arm to remain in sling at all times except RoM  exercises and hygiene. Can perform pendulums, elbow/wrist/hand RoM exercises. Avoid passive RoM at this time" Required Braces or Orthoses: Sling Restrictions Weight Bearing Restrictions: Yes RUE Weight Bearing: Non weight bearing Other Position/Activity Restrictions: Per pt he is still NWB'ing L UE from previous shoulder surgery (but doesn't have to wear the sling on L UE anymore)      Mobility  Bed Mobility               General bed mobility comments: Deferred (pt sitting in recliner beginning/end of session)    Transfers Overall transfer level: Needs assistance Equipment used: None Transfers: Sit to/from Stand Sit to Stand: Supervision           General transfer comment: mild increased effort to stand from recliner but steady    Ambulation/Gait Ambulation/Gait assistance: Contact guard assist Gait Distance (Feet): 180 Feet Assistive device: None Gait Pattern/deviations: Step-through pattern, Decreased step length - right, Decreased step length - left Gait velocity: decreased     General Gait Details: mild increased B lateral sway but no loss of balance noted  Stairs            Wheelchair Mobility     Tilt Bed    Modified Rankin (Stroke Patients Only)       Balance Overall balance assessment: Needs assistance Sitting-balance support: No upper extremity supported, Feet supported Sitting balance-Leahy Scale: Fair Sitting balance - Comments: steady static sitting   Standing balance support: No upper extremity supported, During functional activity Standing balance-Leahy Scale: Good Standing balance comment: no loss of balance noted during ambulation  Pertinent Vitals/Pain Pain Assessment Pain Assessment: 0-10 Pain Score: 2  Pain Location: R shoulder Pain Descriptors / Indicators: Aching, Discomfort Pain Intervention(s): Limited activity within patient's tolerance, Monitored during session, Repositioned,  Ice applied Vitals (HR and SpO2 on room air) stable and WFL throughout treatment session.    Home Living Family/patient expects to be discharged to:: Private residence Living Arrangements: Spouse/significant other (Planning to discharge to daughter's home) Available Help at Discharge: Family;Available 24 hours/day Type of Home: House Home Access: Stairs to enter Entrance Stairs-Rails: Right;Left;Can reach both Entrance Stairs-Number of Steps: 6   Home Layout: One level Home Equipment: BSC/3in1;Shower seat;Lift chair;Other (comment) (gait belt; 2 polar cares) Additional Comments: Pt plans to discharge home to daughters home with wife    Prior Function Prior Level of Function : Needs assist       Physical Assist : ADLs (physical)   ADLs (physical): Feeding;Grooming;Bathing;Dressing;Toileting;IADLs Mobility Comments: Ambulatory with no AD household/short community distances, is able to get up from elevated chair/bsc with no use of BUE ADLs Comments: Was independent in ADL/IADL until previous shoulder surgeries approx 1 month ago, requires assist for all ADL's at this time     Extremity/Trunk Assessment   Upper Extremity Assessment Upper Extremity Assessment: Defer to OT evaluation RUE Deficits / Details: Per OT eval "shoulder NT; elbow AROM elbow approx 3/4, forearm/wrist/hand appear WFL; PROM/MMT NT" RUE: Unable to fully assess due to immobilization RUE Sensation: WNL (per OT eval) RUE Coordination: decreased fine motor (per OT eval)    Lower Extremity Assessment Lower Extremity Assessment: Generalized weakness    Cervical / Trunk Assessment Cervical / Trunk Assessment: Normal  Communication   Communication Communication: No apparent difficulties Cueing Techniques: Verbal cues;Visual cues  Cognition Arousal: Alert Behavior During Therapy: WFL for tasks assessed/performed Overall Cognitive Status: Within Functional Limits for tasks assessed                                           General Comments General comments (skin integrity, edema, etc.): R UE in sling/immobilizer.  Nursing cleared pt for participation in physical therapy.  Pt agreeable to PT session.    Exercises     Assessment/Plan    PT Assessment Patient needs continued PT services  PT Problem List Decreased strength;Decreased range of motion;Decreased activity tolerance;Decreased balance;Decreased mobility;Pain       PT Treatment Interventions DME instruction;Gait training;Stair training;Functional mobility training;Therapeutic activities;Therapeutic exercise;Balance training;Patient/family education    PT Goals (Current goals can be found in the Care Plan section)  Acute Rehab PT Goals Patient Stated Goal: to improve walking PT Goal Formulation: With patient Time For Goal Achievement: 05/15/23 Potential to Achieve Goals: Good    Frequency BID     Co-evaluation               AM-PAC PT "6 Clicks" Mobility  Outcome Measure Help needed turning from your back to your side while in a flat bed without using bedrails?: A Little Help needed moving from lying on your back to sitting on the side of a flat bed without using bedrails?: A Little Help needed moving to and from a bed to a chair (including a wheelchair)?: A Little Help needed standing up from a chair using your arms (e.g., wheelchair or bedside chair)?: A Little Help needed to walk in hospital room?: A Little Help needed climbing 3-5 steps with a railing? : A  Little 6 Click Score: 18    End of Session Equipment Utilized During Treatment: Other (comment) (R shoulder sling/immobilizer) Activity Tolerance: Patient tolerated treatment well Patient left: in chair;with call bell/phone within reach;with chair alarm set Nurse Communication: Mobility status;Precautions PT Visit Diagnosis: Other abnormalities of gait and mobility (R26.89);Muscle weakness (generalized) (M62.81);Pain Pain - Right/Left:  Right Pain - part of body: Shoulder    Time: 2725-3664 PT Time Calculation (min) (ACUTE ONLY): 16 min   Charges:   PT Evaluation $PT Eval Low Complexity: 1 Low   PT General Charges $$ ACUTE PT VISIT: 1 Visit        Hendricks Limes, PT 05/01/23, 5:56 PM

## 2023-05-01 NOTE — Anesthesia Procedure Notes (Signed)
Procedure Name: Intubation Date/Time: 05/01/2023 7:51 AM  Performed by: Monico Hoar, CRNAPre-anesthesia Checklist: Patient identified, Patient being monitored, Timeout performed, Emergency Drugs available and Suction available Patient Re-evaluated:Patient Re-evaluated prior to induction Oxygen Delivery Method: Circle system utilized Preoxygenation: Pre-oxygenation with 100% oxygen Induction Type: IV induction Ventilation: Mask ventilation without difficulty Laryngoscope Size: 4 and McGraph Grade View: Grade I Tube type: Oral Tube size: 7.5 mm Number of attempts: 1 Airway Equipment and Method: Stylet Placement Confirmation: ETT inserted through vocal cords under direct vision, positive ETCO2 and breath sounds checked- equal and bilateral Secured at: 21 cm Tube secured with: Tape Dental Injury: Teeth and Oropharynx as per pre-operative assessment

## 2023-05-01 NOTE — Anesthesia Preprocedure Evaluation (Signed)
Anesthesia Evaluation  Patient identified by MRN, date of birth, ID band Patient awake    Reviewed: Allergy & Precautions, NPO status , Patient's Chart, lab work & pertinent test results  History of Anesthesia Complications Negative for: history of anesthetic complications  Airway Mallampati: III  TM Distance: <3 FB Neck ROM: full    Dental  (+) Chipped, Poor Dentition, Dental Advidsory Given   Pulmonary shortness of breath and with exertion, pneumonia, unresolved, neg COPD, neg recent URI    + decreased breath sounds      Cardiovascular (-) angina (-) Past MI negative cardio ROS Normal cardiovascular exam     Neuro/Psych negative neurological ROS  negative psych ROS   GI/Hepatic negative GI ROS, Neg liver ROS,neg GERD  ,,  Endo/Other  diabetes, Type 2Hypothyroidism    Renal/GU      Musculoskeletal   Abdominal   Peds  Hematology negative hematology ROS (+)   Anesthesia Other Findings Past Medical History: No date: Diabetes type 2, controlled (HCC) No date: Hypothyroid No date: UTI (urinary tract infection)  Past Surgical History: No date: CHOLECYSTECTOMY  BMI    Body Mass Index: 35.58 kg/m      Reproductive/Obstetrics negative OB ROS                             Anesthesia Physical Anesthesia Plan  ASA: 3  Anesthesia Plan: General   Post-op Pain Management:    Induction: Intravenous  PONV Risk Score and Plan: Treatment may vary due to age or medical condition, Propofol infusion and TIVA  Airway Management Planned: Natural Airway and Simple Face Mask  Additional Equipment:   Intra-op Plan:   Post-operative Plan:   Informed Consent: I have reviewed the patients History and Physical, chart, labs and discussed the procedure including the risks, benefits and alternatives for the proposed anesthesia with the patient or authorized representative who has indicated his/her  understanding and acceptance.     Dental Advisory Given  Plan Discussed with: Anesthesiologist, CRNA and Surgeon  Anesthesia Plan Comments: (Back-up GA with ETT if closed reduction attempt fails and Dr. Allena Katz needs to open.  Additionally, we discussed post-op nerve block.)       Anesthesia Quick Evaluation

## 2023-05-01 NOTE — Evaluation (Signed)
Occupational Therapy Evaluation Patient Details Name: Philip Robbins MRN: [id number] DOB: April 26, 1941 Today's Date: 05/01/2023   History of Present Illness Pt is a 82 year old male s/p Revision right shoulder arthroplasty with upsizing of glenoid and humeral components 05/01/23; PMH significant for HTN, chronic hyponatremia, T2DM, B rTSA   Clinical Impression   Patient was seen for an OT evaluation this date. Pt is alert and oriented x4, wife present throughout for education. Pt is familiar to this service from previous admission. Pt and wife report he requires assist for ADL/IADL since previous surgeries and was starting to work with [facility] therapy after discharge from rehab. Pt and wife provided education re:  polar care mgt, sling/immobilizer mgt, ROM exercises for RUE, RUE precautions, adaptive strategies for bathing/dressing/toileting/grooming, positioning and considerations for sleep, and home/routines modifications to maximize falls prevention, safety, and independence. Handout provided. OT adjusted sling/immobilizer and polar care to improve comfort, optimize positioning, and to maximize skin integrity/safety. Pt verbalized understanding of all education/training provided. Pt will benefit from skilled OT services to address these limitations and improve independence in daily tasks. OT will follow acutely.      If plan is discharge home, recommend the following: A little help with walking and/or transfers;A lot of help with bathing/dressing/bathroom;Assistance with cooking/housework;Assistance with feeding;Direct supervision/assist for medications management;Help with stairs or ramp for entrance    Functional Status Assessment  Patient has had a recent decline in their functional status and demonstrates the ability to make significant improvements in function in a reasonable and predictable amount of time.  Equipment Recommendations  None recommended by OT;Other (comment) (pt has recommended  equipment)    Recommendations for Other Services       Precautions / Restrictions Precautions Precautions: Fall;Shoulder Shoulder Interventions: Shoulder sling/immobilizer;At all times;Off for dressing/bathing/exercises Precaution Comments: Operative arm to remain in sling at all times except RoM exercises and hygiene. Can perform pendulums, elbow/wrist/hand RoM exercises. Avoid passive RoM at this time Required Braces or Orthoses: Sling Restrictions Weight Bearing Restrictions: Yes RUE Weight Bearing: Non weight bearing (per pt, he is still NWBing LUE from previous shoulder replacement, has been performing ADLs BUE NWBing, MD messaged for orders in chart)      Mobility Bed Mobility Overal bed mobility: Needs Assistance Bed Mobility: Supine to Sit     Supine to sit: Supervision, HOB elevated          Transfers Overall transfer level: Needs assistance Equipment used: None Transfers: Sit to/from Stand Sit to Stand: Contact guard assist                  Balance Overall balance assessment: Needs assistance Sitting-balance support: Feet supported Sitting balance-Leahy Scale: Good     Standing balance support: No upper extremity supported Standing balance-Leahy Scale: Fair                             ADL either performed or assessed with clinical judgement   ADL Overall ADL's : Needs assistance/impaired Eating/Feeding: Moderate assistance               Upper Body Dressing : Maximal assistance Upper Body Dressing Details (indicate cue type and reason): sling Lower Body Dressing: Maximal assistance Lower Body Dressing Details (indicate cue type and reason): socks Toilet Transfer: Contact guard assist;Ambulation;BSC/3in1   Toileting- Clothing Manipulation and Hygiene: Maximal assistance       Functional mobility during ADLs: Contact guard assist (approx 15' two attempts)  Vision Patient Visual Report: No change from baseline        Perception         Praxis         Pertinent Vitals/Pain Pain Assessment Pain Assessment: 0-10 Pain Score: 2  Pain Location: R shoulder Pain Descriptors / Indicators: Aching, Discomfort Pain Intervention(s): Monitored during session, Limited activity within patient's tolerance, Repositioned, Ice applied     Extremity/Trunk Assessment Upper Extremity Assessment Upper Extremity Assessment: Right hand dominant;RUE deficits/detail RUE Deficits / Details: shoulder NT; elbow AROM elbow approx 3/4, forearm/wrist/hand appear WFL; PROM/MMT NT RUE: Unable to fully assess due to immobilization RUE Sensation: WNL RUE Coordination: decreased fine motor   Lower Extremity Assessment Lower Extremity Assessment: Defer to PT evaluation;Overall [facility] for tasks assessed   Cervical / Trunk Assessment Cervical / Trunk Assessment: Normal   Communication Communication Communication: No apparent difficulties Cueing Techniques: Verbal cues;Visual cues   Cognition Arousal: Alert Behavior During Therapy: WFL for tasks assessed/performed Overall Cognitive Status: Within Functional Limits for tasks assessed                                       General Comments  115/48 (MAP 66), HR 97 bpm, spo2 97% on RA after mobility    Exercises     Shoulder Instructions      Home Living Family/patient expects to be discharged to:: Private residence Living Arrangements: Spouse/significant other (planning to dc home to daugthers) Available Help at Discharge: Family;Available 24 hours/day Type of Home: House Home Access: Stairs to enter [employer] of Steps: 5-6 Entrance Stairs-Rails: Right;Can reach both;Left Home Layout: One level     Bathroom Shower/Tub: Chief Strategy Officer: Standard     Home Equipment: BSC/3in1;Shower seat;Lift chair;Other (comment) (gait belt, 2 polar cares)   Additional Comments: pt plans to discharge home to daughters home with wife       Prior Functioning/Environment Prior Level of Function : Needs assist       Physical Assist : ADLs (physical)   ADLs (physical): Feeding;Grooming;Bathing;Dressing;Toileting;IADLs Mobility Comments: amb with no AD househould/short community distances, is able to get up from elevated chair/bsc with no use of BUE ADLs Comments: as indep in ADL/IADL until previous shoulder surgeries approx 1 month ago, requires assist for all ADL at this time        OT Problem List: Decreased strength;Decreased activity tolerance;Impaired balance (sitting and/or standing);Decreased knowledge of use of DME or AE;Decreased coordination;Decreased cognition;Decreased safety awareness;Decreased knowledge of precautions      OT Treatment/Interventions: Self-care/ADL training;Balance training;Therapeutic exercise;Therapeutic activities;DME and/or AE instruction;Patient/family education    OT Goals(Current goals can be found in the care plan section) Acute Rehab OT Goals Patient Stated Goal: go home OT Goal Formulation: With patient/family Time For Goal Achievement: 05/15/23 Potential to Achieve Goals: Good ADL Goals Pt Will Perform Grooming: with mod assist;sitting Pt Will Perform Lower Body Dressing: with mod assist;sitting/lateral leans Pt Will Transfer to Toilet: with supervision;ambulating Pt Will Perform Toileting - Clothing Manipulation and hygiene: with mod assist;sitting/lateral leans  OT Frequency: Min 1X/week    Co-evaluation              AM-PAC OT "6 Clicks" Daily Activity     Outcome Measure Help from another person eating meals?: A Lot Help from another person taking care of personal grooming?: A Lot Help from another person toileting, which includes using toliet, bedpan, or  urinal?: A Lot Help from another person bathing (including washing, rinsing, drying)?: A Lot Help from another person to put on and taking off regular upper body clothing?: A Lot Help from another person to put  on and taking off regular lower body clothing?: A Lot 6 Click Score: 12   End of Session Equipment Utilized During Treatment: Gait belt Nurse Communication: Mobility status  Activity Tolerance: Patient tolerated treatment well Patient left: in chair;with call bell/phone within reach;with chair alarm set;with family/visitor present  OT Visit Diagnosis: Other abnormalities of gait and mobility (R26.89)                Time: 7829-5621 OT Time Calculation (min): 31 min Charges:  OT General Charges $OT Visit: 1 Visit OT Evaluation $OT Eval Moderate Complexity: 1 Mod  Oleta Mouse, OTD OTR/L  05/01/23, 3:28 PM

## 2023-05-01 NOTE — Transfer of Care (Signed)
Immediate Anesthesia Transfer of Care Note  Patient: Philip Robbins  Procedure(s) Performed: CLOSED REDUCTION SHOULDER (Right: Shoulder) REVISION TOTAL SHOULDER TO REVERSE TOTAL SHOULDER (Right: Shoulder)  Patient Location: PACU  Anesthesia Type:General  Level of Consciousness: awake and alert   Airway & Oxygen Therapy: Patient Spontanous Breathing and Patient connected to nasal cannula oxygen  Post-op Assessment: Report given to RN and Post -op Vital signs reviewed and stable  Post vital signs: Reviewed and stable  Last Vitals:  Vitals Value Taken Time  BP 120/48 05/01/23 1030  Temp    Pulse 93 05/01/23 1033  Resp 18 05/01/23 1033  SpO2 99 % 05/01/23 1033  Vitals shown include unfiled device data.  Last Pain:  Vitals:   05/01/23 0656  TempSrc: Temporal  PainSc: 0-No pain         Complications: No notable events documented.

## 2023-05-01 NOTE — Op Note (Signed)
SURGERY DATE: 05/01/2023    PRE-OP DIAGNOSIS:  1. Instability of right reverse shoulder arthroplasty   POST-OP DIAGNOSIS:  1. Instability of right reverse shoulder arthroplasty   PROCEDURES:  1. Revision right shoulder arthroplasty with upsizing of glenoid and humeral components   SURGEON: Rosealee Albee, MD   ANESTHESIA: Gen + postoperative interscalene block with Exparel   ESTIMATED BLOOD LOSS: 100cc   TOTAL IV FLUIDS: per anesthesia record   IMPLANTS: Original Implants: S&N Aetos 10 deg Augmented baseplate w/central +3 peripheral 4.52mm screws, 38mm concentric glenosphere; Titan Reverse Body Large and Body Screw; TSS Press Fit Size 10 stem; +3 Standard Poly Liner    New implants: Changed glenosphere to a 3mm lateralized 38mm glenosphere and changed Poly Socket to +6 retentive Poly Liner. Original glenoid baseplate and humeral stem well-fixed and retained.   INDICATION(S):  Philip Robbins is a 82 y.o. male who initially underwent right reverse shoulder arthroplasty by for a four-part proximal humerus fracture/dislocation on 03/17/2023.  He missed his initial 2-week postoperative visit due to being admitted to the hospital.  At his 6-week postoperative visit, he was noted to have dislocation of the reverse shoulder arthroplasty.  He cannot recall a specific traumatic event and did not realize his shoulder was dislocated.  Attempted closed reduction in the office was unsuccessful.  After discussion of risks, benefits, and alternatives to surgery, the patient elected to proceed in the above fashion with the goal of regaining a stable shoulder and possibly improved function as well.   OPERATIVE FINDINGS: Able to easily reduce and dislocate and reduce the shoulder prior to making any incision; greater and lesser tuberosities refractured with significant bone loss.  Well fixed humeral component and well fixed glenoid baseplate.   OPERATIVE REPORT:   I identified Philip Robbins in the  pre-operative holding area. Informed consent was obtained and the surgical site was marked. I reviewed the risks and benefits of the proposed surgical intervention and the patient wished to proceed. The patient was transferred to the operative suite and general anesthesia was administered.  I first attempted to perform a straight reduction of the right shoulder.  This was this was successful by holding inline traction, external rotating the arm and applying a posterior force on the proximal humerus.  However, the shoulder required minimal force to redislocate.  Given this, there was high concern that the patient would spontaneously dislocate.  In accordance with our preoperative discussion, we agreed to proceed with revision surgery.    Next, the patient was placed in the beach chair position with the head of the bed elevated approximately 45 degrees. All down side pressure points were appropriately padded. Appropriate IV antibiotics were administered. The extremity was then prepped and draped in standard fashion. A time out was performed confirming the correct extremity, correct patient, and correct procedure.    Prior deltopectoral incision was utilized. Cephalic vein was not visualized, but deltopectoral interval was identified. This was opened and retractors were placed.  We gently palpated the axillary nerve and verified its position and continuity on both sides of the humerus with a Tug test. This test was repeated multiple times during the procedure for nerve localization and confirmed to be intact at the end of the case. The subscapularis was noted to be deficient.  There was no significant sign of infection, but multiple culture samples were sent (wound culture from joint fluid, tissue samples from around the humerus, poly tray, and glenoid).   At this point, the  joint was easily dislocated. The poly insert was removed by using osteotomes until the poly disassociated. The humeral component was noted  to be well-fixed. Scar tissue was removed about the humeral component.    We then turned our attention back to the glenoid. The proximal humerus was retracted posteriorly and inferiorly. Scar tissue was removed from around the anterior glenoid. Anterior and superior retractor were placed for improved visualization. The glenosphere screw and then glenosphere were both removed.  Further scar tissue from around the glenoid baseplate was removed.  The peripheral reamer was utilized.  A +72mm lateralized glenosphere trial was placed.   We then turned our attention back to the humerus.  A +6 retentive poly insert was placed.  This achieved appropriate stability with significant difficulty dislocating the shoulder.  A +9 poly insert could not be placed as it was too tight.  The humeral component was pulse lavaged. Actual components on both the glenosphere (38 mm +3 lateralized) and humerus (+6 retentive insert) were placed. This construct achieved satisfactory stability and motion.  The greater tuberosity was identified.  Sutures were placed around the greater tuberosity and passed through the holes on the lateral aspect of the fin on the humeral stem.  The sutures were tied and the greater tuberosity was noted to be in a stable position, slightly posteriorly. A Hemovac drain was placed. Vancomycin powder was placed. Subscapularis/lesser tuberosity was unable to be repaired.   We closed the deltopectoral interval with a running, 0-Vicryl suture. The skin was closed with 2-0 Vicryl and staples. Xeroform and Honeycomb dressing was applied. A PolarCare unit and sling were placed. Patient was extubated, transferred to a stretcher bed and to the post antesthesia care unit in stable condition.    POSTOPERATIVE PLAN: The patient will be admitted with plan for discharge home on POD#1. Operative arm to remain in sling at all times except RoM exercises and hygiene. Can perform pendulums, elbow/wrist/hand RoM exercises. Avoid  passive RoM at this time.  ASA 325 mg/day x 6 weeks for DVT prophylaxis.

## 2023-05-01 NOTE — Anesthesia Postprocedure Evaluation (Signed)
Anesthesia Post Note  Patient: Chana Bode  Procedure(s) Performed: CLOSED REDUCTION SHOULDER (Right: Shoulder) REVISION TOTAL SHOULDER TO REVERSE TOTAL SHOULDER (Right: Shoulder)  Patient location during evaluation: PACU Anesthesia Type: General Level of consciousness: awake and alert Pain management: pain level controlled Vital Signs Assessment: post-procedure vital signs reviewed and stable Respiratory status: spontaneous breathing, nonlabored ventilation, respiratory function stable and patient connected to nasal cannula oxygen Cardiovascular status: blood pressure returned to baseline and stable Postop Assessment: no apparent nausea or vomiting Anesthetic complications: no   No notable events documented.   Last Vitals:  Vitals:   05/01/23 1319 05/01/23 1539  BP: (!) 124/54 (!) 119/51  Pulse: (!) 102 88  Resp: 14 14  Temp:  36.6 C  SpO2: 96% 96%    Last Pain:  Vitals:   05/01/23 1300  TempSrc:   PainSc: 0-No pain                 Lenard Simmer

## 2023-05-01 NOTE — Plan of Care (Signed)
[facility] Tonsillectomy/Adenoidectomy, Postoperative PEDS care plan entered in error.

## 2023-05-02 ENCOUNTER — Encounter: Payer: Self-pay | Admitting: Orthopedic Surgery

## 2023-05-02 LAB — BASIC METABOLIC PANEL
Anion gap: 6 (ref 5–15)
BUN: 10 mg/dL (ref 8–23)
CO2: 24 mmol/L (ref 22–32)
Calcium: 8.6 mg/dL — ABNORMAL LOW (ref 8.9–10.3)
Chloride: 102 mmol/L (ref 98–111)
Creatinine, Ser: 0.7 mg/dL (ref 0.61–1.24)
GFR, Estimated: >60 mL/min (ref >60)
Glucose, Bld: 150 mg/dL — ABNORMAL HIGH (ref 70–99)
Potassium: 4.3 mmol/L (ref 3.5–5.1)
Sodium: 132 mmol/L — ABNORMAL LOW (ref 135–145)

## 2023-05-02 LAB — CBC
HCT: 29.8 % — ABNORMAL LOW (ref 39.0–52.0)
Hemoglobin: 9.6 g/dL — ABNORMAL LOW (ref 13.0–17.0)
MCH: 29.2 pg (ref 26.0–34.0)
MCHC: 32.2 g/dL (ref 30.0–36.0)
MCV: 90.6 fL (ref 80.0–100.0)
Platelets: 498 10*3/uL — ABNORMAL HIGH (ref 150–400)
RBC: 3.29 MIL/uL — ABNORMAL LOW (ref 4.22–5.81)
RDW: 15.5 % (ref 11.5–15.5)
WBC: 28.2 10*3/uL — ABNORMAL HIGH (ref 4.0–10.5)
nRBC: 0 % (ref 0.0–0.2)

## 2023-05-02 MED ORDER — ONDANSETRON HCL 4 MG PO TABS: 4.0000 mg | ORAL_TABLET | Freq: Four times a day (QID) | ORAL | 0 refills | Status: DC | PRN

## 2023-05-02 MED ORDER — ASPIRIN 325 MG PO TBEC: 325.0000 mg | DELAYED_RELEASE_TABLET | Freq: Every day | ORAL | 0 refills | Status: AC

## 2023-05-02 MED ORDER — OXYCODONE HCL 5 MG PO TABS: 5.0000 mg | ORAL_TABLET | Freq: Four times a day (QID) | ORAL | 0 refills | Status: DC | PRN

## 2023-05-02 NOTE — Discharge Summary (Signed)
Physician Discharge Summary  Patient ID: Philip Robbins MRN: [id number] DOB/AGE: 1941-07-07 82 y.o.  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) 05/01/2023   Hypothyroidism 04/03/2023   Chronic diastolic CHF (congestive heart failure) (HCC) 04/02/2023   Obesity (BMI 30-39.9) 04/02/2023   Iron deficiency anemia 04/02/2023   Diarrhea 04/02/2023   Type 2 diabetes mellitus without complication, without long-term current use of insulin (HCC) 03/23/2023   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) 03/23/2023   Acute metabolic encephalopathy 03/20/2023   Bilateral humeral fractures 03/18/2023   Acute hypoxic respiratory failure (HCC) 03/18/2023   Hypophosphatemia 03/18/2023   Hypomagnesemia 03/18/2023   CAP (community acquired pneumonia) 03/08/2023   Pneumonia 03/08/2023   Sepsis secondary to UTI (HCC) 03/08/2023   Hyponatremia 03/08/2023   BPH (benign prostatic hyperplasia) 12/21/2022   Irritable bowel syndrome without diarrhea 12/21/2022   Hypertension 12/21/2022   Bilateral hearing loss 01/23/2021   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/2017   Hyperlipidemia 01/17/2016   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   Lymphocytosis 05/04/2015   Status post arthroscopic surgery of right knee 04/10/2015   Tear of meniscus of knee 03/08/2015   Seasonal allergic rhinitis 02/17/2014   Herpes zoster ophthalmicus 11/14/2013    Past Medical History:  Diagnosis Date   Arthritis    knee and back   BPH (benign prostatic hypertrophy)    Diabetes type 2, controlled (HCC)    DM type 2 (diabetes mellitus, type 2) (HCC)    Environmental allergies    Headache    sinus headaches   HLD (hyperlipidemia)    Hypothyroid    UTI (urinary tract  infection)      Transfusion: None   Consultants (if any):   Discharged Condition: Improved  Hospital Course: Philip Robbins is an 82 y.o. 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    Surgeries: Procedure(s): CLOSED REDUCTION SHOULDER REVISION TOTAL SHOULDER TO REVERSE TOTAL SHOULDER on 05/01/2023 Patient tolerated the surgery well. Taken to PACU where she was stabilized and then transferred to the orthopedic floor.  Started on aspirin 325 mg daily.  SCDs applied.  Physical therapy started on day #1 for gait training and transfer. OT started day #1 for ADL and assisted devices.  Patient's IV and Hemovac was d/c on day #1. Patient was able to safely and independently complete all PT goals. PT recommending discharge to home.    On post op day #1 patient was stable and ready for discharge to home with home health.  Implants: S&N Aetos 10 deg Augmented baseplate w/central +3 peripheral 4.78mm screws, 38mm concentric glenosphere; Titan Reverse Body Large and Body Screw; TSS Press Fit Size 10 stem; +3 Standard Poly Liner     He was given perioperative antibiotics:  Anti-infectives (From admission, onward)    Start     Dose/Rate Route Frequency Ordered Stop   05/01/23 1430  ceFAZolin (ANCEF) IVPB 2g/100 mL premix        2 g 200 mL/hr over 30 Minutes Intravenous Every 6 hours 05/01/23 1348 05/02/23 0242   05/01/23 1001  vancomycin (VANCOCIN) powder  Status:  Discontinued  As needed 05/01/23 1001 05/01/23 1108   05/01/23 0615  ceFAZolin (ANCEF) IVPB 2g/100 mL premix        2 g 200 mL/hr over 30 Minutes Intravenous On call to O.R. 05/01/23 1610 05/01/23 0809     .  He was given sequential compression devices, early ambulation, and aspirin for DVT prophylaxis.  He benefited maximally from the hospital stay and there  were no complications.    Recent vital signs:  Vitals:   05/02/23 0430 05/02/23 0838  BP: (!) 119/47 (!) 125/49  Pulse: 73 77  Resp: 16 18  Temp: 98.4 F (36.9 C) 98 F (36.7 C)  SpO2: 97% 98%    Recent laboratory studies:  Lab Results  Component Value Date   HGB 9.6 (L) 05/02/2023   HGB 11.8 (L) 05/01/2023   HGB 11.0 (A) 04/09/2023   Lab Results  Component Value Date   WBC 28.2 (H) 05/02/2023   PLT 498 (H) 05/02/2023   No results found for: "INR" Lab Results  Component Value Date   NA 132 (L) 05/02/2023   K 4.3 05/02/2023   CL 102 05/02/2023   CO2 24 05/02/2023   BUN 10 05/02/2023   CREATININE 0.70 05/02/2023   GLUCOSE 150 (H) 05/02/2023    Discharge Medications:   Allergies as of 05/02/2023   No Known Allergies      Medication List     STOP taking these medications    oxycodone 5 MG capsule Commonly known as: OXY-IR Replaced by: oxyCODONE 5 MG immediate release tablet       TAKE these medications    acetaminophen 500 MG tablet Commonly known as: TYLENOL Take 1,000 mg by mouth every 8 (eight) hours as needed for mild pain. Give 2 tablets by mouth every 8 hours as needed.   amLODipine 5 MG tablet Commonly known as: NORVASC Take 5 mg by mouth daily.   ascorbic acid 500 MG tablet Commonly known as: VITAMIN C Take 1 tablet (500 mg total) by mouth daily.   aspirin EC 325 MG tablet Take 1 tablet (325 mg total) by mouth daily.   atorvastatin 40 MG tablet Commonly known as: LIPITOR Take 40 mg by mouth daily.   bisacodyl 10 MG suppository Commonly known as: DULCOLAX Place 10 mg rectally daily as needed for moderate constipation.   cholestyramine 4 g packet Commonly known as: QUESTRAN Take 4 g by mouth 3 (three) times daily.   cyanocobalamin 1000 MCG tablet Take 1 tablet (1,000 mcg total) by mouth daily.   docusate sodium 100 MG capsule Commonly known as: COLACE Take 1 capsule (100 mg total) by mouth 2 (two) times daily.   fluticasone  50 MCG/ACT nasal [place] Commonly known as: FLONASE Place 2 sprays into both nostrils every evening.   folic acid 1 MG tablet Commonly known as: FOLVITE Take 1 mg by mouth daily.   iron polysaccharides 150 MG capsule Commonly known as: NIFEREX Take 1 capsule (150 mg total) by mouth daily.   levothyroxine 75 MCG tablet Commonly known as: SYNTHROID Take 75 mcg by mouth daily before breakfast.   lisinopril 10 MG tablet Commonly known as: ZESTRIL Take 10 mg by mouth daily.   melatonin 3 MG Tabs tablet Take 3 mg by mouth at bedtime.   metFORMIN 1000 MG tablet Commonly known as: GLUCOPHAGE Take 1 tablet by mouth 2 (two) times daily with a meal.   nystatin cream Commonly known as: MYCOSTATIN Apply 1 Application topically. Every shift.   ondansetron  4 MG tablet Commonly known as: ZOFRAN Take 1 tablet (4 mg total) by mouth every 6 (six) hours as needed for nausea.   oxyCODONE 5 MG immediate release tablet Commonly known as: Oxy IR/ROXICODONE Take 1-2 tablets (5-10 mg total) by mouth every 6 (six) hours as needed for moderate pain (pain score 4-6) (pain score 4-6). Replaces: oxycodone 5 MG capsule   OXYGEN 2 LPM every 8 hours as needed.   oxymetazoline 0.05 % nasal [place] Commonly known as: AFRIN Place 1 Dotter into both nostrils 2 (two) times daily.   pioglitazone 45 MG tablet Commonly known as: ACTOS Take 45 mg by mouth daily.   sitaGLIPtin 50 MG tablet Commonly known as: JANUVIA Take 50 mg by mouth daily.   sodium chloride 0.65 % Soln nasal Semmel Commonly known as: OCEAN Place 2 sprays into both nostrils as needed for congestion.   tamsulosin 0.4 MG Caps capsule Commonly known as: FLOMAX Take 0.4 mg by mouth at bedtime.   Vitamin D (Ergocalciferol) 1.25 MG (50000 UNIT) Caps capsule Commonly known as: DRISDOL Take 1 capsule (50,000 Units total) by mouth every 7 (seven) days.   ZINC OXIDE EX Apply topically. Apply to buttocks topically every shift for skin         Diagnostic Studies: DG Shoulder 1V Right  Result Date: 05/01/2023 CLINICAL DATA:  Right shoulder surgery EXAM: RIGHT SHOULDER - 1 VIEW COMPARISON:  03/17/2023 FINDINGS: Single frontal radiograph of the right shoulder demonstrates reverse right shoulder arthroplasty hardware within its expected alignment. No dislocation. No periprosthetic lucency or fracture identified. Expected postoperative changes within the overlying soft tissues. IMPRESSION: Reverse right shoulder arthroplasty without evidence of immediate hardware complication. Electronically Signed   By: Duanne Guess D.O.   On: 05/01/2023 14:56   DG Shoulder 1V Left  Result Date: 05/01/2023 CLINICAL DATA:  Reverse shoulder arthroplasty EXAM: LEFT SHOULDER COMPARISON:  Shoulder radiographs 03/17/2023 FINDINGS: A single view of the left shoulder was obtained. Postsurgical changes reflecting reverse shoulder arthroplasty are again seen. There is persistent fracture lucency through the lateral aspect of the proximal humerus but the previously seen cortical step-off is not appreciated on this study. There is no new fracture or other evidence of complication. Acromioclavicular alignment is maintained. IMPRESSION: Postsurgical changes reflecting reverse shoulder arthroplasty again seen with persistent fracture lucency along the lateral aspect of the proximal humerus. The previously seen cortical step-off is not appreciated on this single view. Electronically Signed   By: Lesia Hausen M.D.   On: 05/01/2023 14:54   DG Shoulder Right  Result Date: 05/01/2023 CLINICAL DATA:  Closed right shoulder reduction. EXAM: RIGHT SHOULDER - 2+ VIEW; DG C-ARM 1-60 MIN-NO REPORT Radiation exposure index: 1.77 mGy. COMPARISON:  March 17, 2023. FINDINGS: Three intraoperative fluoroscopic images were obtained of the right shoulder. Glenoid and humeral components are well situated. No definite dislocation is noted currently. IMPRESSION: Fluoroscopic  guidance provided during right shoulder procedure. Electronically Signed   By: Lupita Raider M.D.   On: 05/01/2023 11:48   [place] OR NERVE BLOCK-IMAGE ONLY [facility])  Result Date: 05/01/2023 There is no interpretation for this exam.  This order is for images obtained during a surgical procedure.  Please See "Surgeries" Tab for more information regarding the procedure.   DG C-Arm 1-60 Min-No Report  Result Date: 05/01/2023 Fluoroscopy was utilized by the requesting physician.  No radiographic interpretation.    Disposition:      Follow-up Information     Dedra Skeens, PA-C Follow up in  2 week(s).   Specialty: Orthopedic Surgery Contact information: [address] [place] [address] [phone]                  Signed: Patience Musca 05/02/2023, 8:50 AM

## 2023-05-02 NOTE — TOC Transition Note (Signed)
Transition of Care [facility]) - CM/SW Discharge Note   Patient Details  Name: Philip Robbins MRN: [id number] Date of Birth: 12-Oct-1940  Transition of [facility]) CM/SW Contact:  Bing Quarry, RN Phone Number: 05/02/2023, 10:40 AM   Clinical Narrative:  05/02/23: DC orders in for today. Spoke with spouse and patient. Active with [facility], had needed DME at home. HH Orders in for resumption of HH care post discharge. Messaged on call rep for [facility] to confirm discharge. Spouse to transport to home on discharge.   Gabriel Cirri MSN RN CM  Transitions of Care Department [facility] [phone] Weekends Only      Final next level of care: Home w Home Health Services (Active with [facility] and are schedule to see again next week. RN CM will notifiy [facility] of DC today and resumption orders in chart.) Barriers to Discharge: No Barriers Identified   Patient Goals and CMS Choice      Discharge Placement                         Discharge Plan and Services Additional resources added to the After Visit Summary for                  DME Arranged: N/A DME Agency: NA       HH Arranged: PT, OT HH Agency: [facility]        Social Determinants of Health (SDOH) Interventions SDOH Screenings   Food Insecurity: No Food Insecurity (04/20/2023)   Received from [facility]  Housing: Low Risk  (04/03/2023)  Transportation Needs: No Transportation Needs (04/20/2023)   Received from [facility]  Utilities: Not At Risk (04/20/2023)   Received from [facility]  Financial Resource Strain: Low Risk  (04/20/2023)   Received from [facility]  Tobacco Use: Low Risk  (05/01/2023)     Readmission Risk Interventions    04/03/2023   11:20 AM  Readmission Risk Prevention Plan  Transportation Screening Complete  PCP or Specialist Appt within 3-5 Days Complete  HRI or Home Care  Consult Complete  Social Work Consult for Recovery Care Planning/Counseling Complete  Palliative Care Screening Not Applicable  Medication Review Oceanographer) Not Complete  Med Review Comments will review upon discharge

## 2023-05-02 NOTE — Discharge Instructions (Signed)
Rosealee Albee, MD  [facility]  Phone: [phone]  Fax: [phone]   Discharge Instructions after Reverse Shoulder Replacement    1. Activity/Sling: You are to be non-weight bearing on operative extremity. A sling/shoulder immobilizer has been provided for you. Only remove the sling to perform elbow, wrist, and hand RoM exercises and hygiene/dressing. Active reaching and lifting are not permitted. You will be given further instructions on sling use at your first physical therapy visit and postoperative visit with Dr. Allena Katz.   2. Dressings: Dressing may be removed at 1st physical therapy visit (~3-4 days after surgery). Afterwards, you may either leave open to air (if no drainage) or cover with dry, sterile dressing. If you have steri-strips on your wound, please do not remove them. They will fall off on their own. You may shower 5 days after surgery. Please pat incision dry. Do not rub or place any shear forces across incision. If there is drainage or any opening of incision after 5 days, please notify our offices immediately.    3. Driving:  Plan on not driving for six weeks. Please note that you are advised NOT to drive while taking narcotic pain medications as you may be impaired and unsafe to drive.   4. Medications:  - You have been provided a prescription for narcotic pain medicine (usually oxycodone). After surgery, take 1-2 narcotic tablets every 4 hours if needed for severe pain. Please start this as soon as you begin to start having pain (if you received a nerve block, start taking as soon as this wears off).  - A prescription for anti-nausea medication will be provided in case the narcotic medicine causes nausea - take 1 tablet every 6 hours only if nauseated.  - Take enteric coated aspirin 325 mg once daily for 6 weeks to prevent blood clots. Do not take aspirin if you have an aspirin sensitivity/allergy or asthma or are on an anticoagulant (blood thinner) already. If so, then  your home anticoagulant will be resume and managed - do not take aspirin. -Take tylenol 1000mg  (2 Extra strength or 3 regular strength tablets) every 8 hours for pain. This will reduce the amount of narcotic medication needed. May stop tylenol when you are having minimal pain. - Take a stool softener (Colace, Dulcolax or Senakot) if you are using narcotic pain medications to help with constipation that is associated with narcotic use. - DO NOT take ANY nonsteroidal anti-inflammatory pain medications: Advil, Motrin, Ibuprofen, Aleve, Naproxen, or Naprosyn.   If you are taking prescription medication for anxiety, depression, insomnia, muscle spasm, chronic pain, or for attention deficit disorder you are advised that you are at a higher risk of adverse effects with use of narcotics post-op, including narcotic addiction/dependence, depressed breathing, death. If you use non-prescribed substances: alcohol, marijuana, cocaine, heroin, methamphetamines, etc., you are at a higher risk of adverse effects with use of narcotics post-op, including narcotic addiction/dependence, depressed breathing, death. You are advised that taking > 50 morphine milligram equivalents (MME) of narcotic pain medication per day results in twice the risk of overdose or death. For your prescription provided: oxycodone 5 mg - taking more than 6 tablets per day after the first few days of surgery.   5. Physical Therapy: 1-2 times per week for ~12 weeks. Therapy typically starts on post operative Day 3 or 4. You have been provided an order for physical therapy. The therapist will provide home exercises. Please contact our offices if this appointment has not been scheduled.  6. Work: May do light duty/desk job in approximately 2 weeks when off of narcotics, pain is well-controlled, and swelling has decreased if able to function with one arm in sling. Full work may take 6 weeks if light motions and function of both arms is required.  Lifting jobs may require 12 weeks.   7. Post-Op Appointments: Your first post-op appointment will be with Dr. Allena Katz in approximately 2 weeks time.    If you find that they have not been scheduled please call the Orthopaedic Appointment front desk at [phone].                               Rosealee Albee, MD [facility] Phone: [phone] Fax: [phone]   REVERSE SHOULDER ARTHROPLASTY REHAB GUIDELINES   These guidelines should be tailored to individual patients based on their rehab goals, age, precautions, quality of repair, etc.  Progression should be based on patient progress and approval by the referring physician.  PHASE 1 - Day 1 through Week 2  GENERAL GUIDELINES AND PRECAUTIONS Sling wear 24/7 except during grooming and home exercises (3 to 5 times daily) Avoid shoulder extension such that the arm is posterior the frontal plane.  When patients recline, a pillow should be placed behind the upper arm and sling should be on.  They should be advised to always be able to see the elbow Avoid combined IR/ADD/EXT, such as hand behind back to prevent dislocation Avoid combined IR and ADD such as reaching across the chest to prevent dislocation No AROM No submersion in pool/water for 4 weeks No weight bearing through operative arm (as in transfers, walker use, etc.)  GOALS Maintain integrity of joint replacement; protect soft tissue healing Increase PROM for elevation to 120 and ER to 30 (will remain the goal for first 6 weeks) Optimize distal UE circulation and muscle activity (elbow, wrist and hand) Instruct in use of sling for proper fit, polar care device for ice application after HEP, signs/symptoms of infection  EXERCISES Active elbow, wrist and hand Passive forward elevation in scapular plane to 90-120 max motion; ER in scapular plane to 30 Active scapular retraction with arms resting in neutral position  CRITERIA TO PROGRESS TO  PHASE 2 Low pain (less than 3/10) with shoulder PROM Healing of incision without signs of infection Clearance by MD to advance after 2 week MD check up  PHASE 2 - 2 weeks - 6 weeks  GENERAL GUIDELINES AND PRECAUTIONS Sling may be removed while at home; worn in community without abduction pillow May use arm for light activities of daily living (such as feeding, brushing teeth, dressing.) with elbow near  the side of the body  and arm in front of the body- no active lifting of the arm May submerge in water (tub, pool, [place], etc.) after 4 weeks Continue to avoid WBing through the operative arm Continue to avoid combined IR/EXT/ADD (hand behind the back) and IR/ADD  (reaching across chest) for dislocation precautions  GOALS  Achieve passive elevation to 120 and ER to 30  Low (less than 3/10) to no pain  Ability to fire all heads of the deltoid  EXERCISES May discontinue grip, and active elbow and wrist exercises since using the arm in ADL's  with sling removed around the home Continue passive elevation to 120 and ER to 30, both in scapular plane with arm supported on table top Add submaximal isometrics, pain free effort, for all  functional heads of deltoid (anterior, posterior, middle)  Ensure that with posterior deltoid isometric the shoulder does not move into extension and the arm remains anterior the frontal plane At 4 weeks:  begin to place arm in balanced position of 90 deg elevation in supine; when patient able to hold this position with ease, may begin reverse pendulums clockwise and counterclockwise  CRITERIA TO PROGRESS TO PHASE 3 Passive forward elevation in scapular plane to 120; passive ER in scapular plane to 30 Ability to fire isometrically all heads of the deltoid muscle without pain Ability to place and hold the arm in balanced position (90 deg elevation in supine)  PHASE 3 - 6 weeks to 3 months  GENERAL GUIDELINES AND PRECAUTIONS Discontinue use of sling Avoid  forcing end range motion in any direction to prevent dislocation  May advance use of the arm actively in ADL's without being restricted to arm by the side of the body, however, avoid heavy lifting and sports (forever!) May initiate functional IR behind the back gently NO UPPER BODY ERGOMETER   GOALS Optimize PROM for elevation and ER in scapular plane with realistic expectation that max  mobility for elevation is usually around 145-160 passively; ER 40 to 50 passively; functional IR to L1 Recover AROM to approach as close to PROM available as possible; may expect 135-150 deg active elevation; 30 deg active ER; active functional IR to L1 Establish dynamic stability of the shoulder with deltoid and periscapular muscle gradual strengthening  EXERCISES Forward elevation in scapular plane active progression: supine to incline, to vertical; short to long lever arm Balanced position long lever arm AROM Active ER/IR with arm at side Scapular retraction with light band resistance Functional IR with hand slide up back - very gentle and gradual NO UPPER BODY ERGOMETER     CRITERIA TO PROGRESS TO PHASE 4  AROM equals/approaches PROM with good mechanics for elevation   No pain  Higher level demand on shoulder than ADL functions   PHASE 4 12 months and beyond  GENERAL GUIDELINES AND PRECAUTIONS No heavy lifting and no overhead sports No heavy pushing activity Gradually increase strength of deltoid and scapular stabilizers; also the rotator cuff if present with weights not to exceed 5 lbs NO UPPER BODY ERGOMETER   GOALS  Optimize functional use of the operative UE to meet the desired demands  Gradual increase in deltoid, scapular muscle, and rotator cuff strength  Pain free functional activities   EXERCISES Add light hand weights for deltoid up to and not to exceed 3 lbs for anterior and posterior with long arm lift against gravity; elbow bent to 90 deg for abduction in scapular  plane Theraband progression for extension to hip with scapular depression/retraction Theraband progression for serratus anterior punches in supine; avoid wall, incline or prone pressups for serratus anterior End range stretching gently without forceful overpressure in all planes (elevation in scapular plane, ER in scapular plane, functional IR) with stretching done for life as part of a daily routine NO UPPER BODY ERGOMETER     CRITERIA FOR DISCHARGE FROM SKILLED PHYSICAL THERAPY  Pain free AROM for shoulder elevation (expect around 135-150)  Functional strength for all ADL's, work tasks, and hobbies approved by Careers adviser  Independence with home maintenance program   NOTES: 1. With proper exercise, motion, strength, and function continue to improve even after one year. 2. The complication rate after surgery is 5 - 8%. Complications include infection, fracture, heterotopic bone formation, nerve injury, instability, rotator cuff  tear, and tuberosity nonunion. Please look for clinical signs, unusual symptoms, or lack of progress with therapy and report those to Dr. Allena Katz. Prefer more communication than less.  3. The therapy plan above only serves as a guide. Please be aware of specific individualized patient instructions as written on the prescription or through discussions with the surgeon. 4. Please call Dr. Allena Katz if you have any specific questions or concerns [phone]

## 2023-05-02 NOTE — Progress Notes (Signed)
Physical Therapy Treatment Patient Details Name: Philip Robbins MRN: [id number] DOB: 1940-12-28 Today's Date: 05/02/2023   History of Present Illness Pt is an 82 y.o male s/p revision of R shoulder arthroplasty with upsizing of glenoid and humeral components 05/01/23 d/t instability of R reverse shoulder arthroplasty.  PMH includes B reverse TSA's, UTI, CLL under surveillance, BPH, htn, IIDM, hypothyroidism, HLD, chronic leukocytosis.    PT Comments  Pt demonstrates steadiness with functional ambulation with no AD, stair negotiation, and transfers performing at supervision to ModI level.  There are no barriers from a PT standpoint for D/C.  Pt would benefit from continued PT to progress R UE strength, ROM, as well as higher level balance for increased safety and independence with mobility.   If plan is discharge home, recommend the following: A little help with walking and/or transfers;A little help with bathing/dressing/bathroom;Assistance with cooking/housework;Assistance with feeding;Assist for transportation;Help with stairs or ramp for entrance   Can travel by private vehicle        Equipment Recommendations  None recommended by PT    Recommendations for Other Services       Precautions / Restrictions Precautions Precautions: Fall;Shoulder Shoulder Interventions: Shoulder sling/immobilizer;At all times;Off for dressing/bathing/exercises Precaution Comments: Per op note: "Operative arm to remain in sling at all times except RoM exercises and hygiene. Can perform pendulums, elbow/wrist/hand RoM exercises. Avoid passive RoM at this time" Required Braces or Orthoses: Sling Restrictions Weight Bearing Restrictions: Yes RUE Weight Bearing: Non weight bearing Other Position/Activity Restrictions: Per pt he is still NWB'ing L UE from previous shoulder surgery (but doesn't have to wear the sling on L UE anymore)     Mobility  Bed Mobility               General bed mobility  comments: Deferred (pt sitting in recliner beginning/end of session)    Transfers Overall transfer level: Modified independent Equipment used: None Transfers: Sit to/from Stand Sit to Stand: Modified independent (Device/Increase time)           General transfer comment: no cues needed for transfers    Ambulation/Gait Ambulation/Gait assistance: Supervision Gait Distance (Feet): 200 Feet Assistive device: None Gait Pattern/deviations: Step-through pattern, WFL(Within Functional Limits) Gait velocity: decreased     General Gait Details: mild increased B lateral sway with head turns but no loss of balance noted   Stairs Stairs: Yes Stairs assistance: Supervision Stair Management: No rails, Step to pattern Number of Stairs: 8 General stair comments: pt was steady   Wheelchair Mobility     Tilt Bed    Modified Rankin (Stroke Patients Only)       Balance Overall balance assessment: Modified Independent Sitting-balance support: No upper extremity supported, Feet supported Sitting balance-Leahy Scale: Good Sitting balance - Comments: good functional sitting balance   Standing balance support: No upper extremity supported, During functional activity Standing balance-Leahy Scale: Good Standing balance comment: no loss of balance noted during ambulation, pt self corrected sway (during head turns with gait)                            Cognition Arousal: Alert Behavior During Therapy: WFL for tasks assessed/performed Overall Cognitive Status: Within Functional Limits for tasks assessed  Exercises      General Comments        Pertinent Vitals/Pain Pain Assessment Pain Assessment: No/denies pain Pain Location: R shoulder Pain Descriptors / Indicators: Aching, Discomfort Pain Intervention(s): Monitored during session    Home Living                          Prior Function             PT Goals (current goals can now be found in the care plan section) Acute Rehab PT Goals Patient Stated Goal: to improve walking PT Goal Formulation: With patient Time For Goal Achievement: 05/15/23 Potential to Achieve Goals: Good Progress towards PT goals: Progressing toward goals    Frequency    BID      PT Plan      Co-evaluation              AM-PAC PT "6 Clicks" Mobility   Outcome Measure  Help needed turning from your back to your side while in a flat bed without using bedrails?: A Little Help needed moving from lying on your back to sitting on the side of a flat bed without using bedrails?: A Little Help needed moving to and from a bed to a chair (including a wheelchair)?: A Little Help needed standing up from a chair using your arms (e.g., wheelchair or bedside chair)?: None Help needed to walk in hospital room?: None Help needed climbing 3-5 steps with a railing? : A Little 6 Click Score: 20    End of Session Equipment Utilized During Treatment: Other (comment) (R shoulder sling/immobilizer) Activity Tolerance: Patient tolerated treatment well Patient left: in chair;with call bell/phone within reach;with chair alarm set Nurse Communication: Mobility status;Precautions PT Visit Diagnosis: Other abnormalities of gait and mobility (R26.89);Muscle weakness (generalized) (M62.81);Pain Pain - Right/Left: Right Pain - part of body: Shoulder     Time: 8309-4076 PT Time Calculation (min) (ACUTE ONLY): 12 min  Charges:    $Gait Training: 8-22 mins PT General Charges $$ ACUTE PT VISIT: 1 Visit                     Hortencia Conradi, PTA  05/02/23, 9:24 AM

## 2023-05-02 NOTE — Plan of Care (Signed)

## 2023-05-02 NOTE — Progress Notes (Signed)
   Subjective: 1 Day Post-Op Procedure(s) (LRB): CLOSED REDUCTION SHOULDER (Right) REVISION TOTAL SHOULDER TO REVERSE TOTAL SHOULDER (Right) Patient reports pain as mild.   Patient is well, and has had no acute complaints or problems Denies any CP, SOB, ABD pain. We will continue therapy today.  Plan is to go Home after hospital stay.  Objective: Vital signs in last 24 hours: Temp:  [97.2 F (36.2 C)-98.4 F (36.9 C)] 98 F (36.7 C) (10/19 0838) Pulse Rate:  [73-104] 77 (10/19 0838) Resp:  [11-20] 18 (10/19 0838) BP: (98-134)/(47-64) 125/49 (10/19 0838) SpO2:  [91 %-98 %] 98 % (10/19 0838)  Intake/Output from previous day: 10/18 0701 - 10/19 0700 In: 1740 [P.O.:240; I.V.:1200; IV Piggyback:300] Out: 40 [Drains:40] Intake/Output this shift: No intake/output data recorded.  Recent Labs    05/01/23 0635 05/02/23 0522  HGB 11.8* 9.6*   Recent Labs    05/01/23 0635 05/02/23 0522  WBC 24.7* 28.2*  RBC 4.17* 3.29*  HCT 38.5* 29.8*  PLT 610* 498*   Recent Labs    05/01/23 0635 05/02/23 0522  NA 134* 132*  K 4.4 4.3  CL 101 102  CO2 21* 24  BUN 11 10  CREATININE 0.69 0.70  GLUCOSE 160* 150*  CALCIUM 9.4 8.6*   No results for input(s): "LABPT", "INR" in the last 72 hours.  EXAM General - Patient is Alert, Appropriate, and Oriented Right extremity - Neurovascular intact Sensation intact distally Intact pulses distally Dorsiflexion/Plantar flexion intact Compartment soft Dressing - dressing C/D/I Hemovac removed Motor Function - intact, moving foot and toes well on exam.   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   Weight as of this encounter: 96.2 kg. Advance diet Up with therapy Pain well-controlled Labs and vital signs are stable Hemovac removed Care management to assist with discharge to home with home health PT  DVT Prophylaxis -  aspirin    T. Cranston Neighbor, PA-C [facility] 05/02/2023, 8:45 AM

## 2023-05-06 LAB — AEROBIC/ANAEROBIC CULTURE W GRAM STAIN (SURGICAL/DEEP WOUND)
Gram Stain: NONE SEEN
Gram Stain: NONE SEEN
Gram Stain: NONE SEEN

## 2023-05-13 ENCOUNTER — Ambulatory Visit: Payer: Self-pay | Admitting: Urology

## 2023-05-25 ENCOUNTER — Other Ambulatory Visit: Payer: Self-pay

## 2023-05-25 DIAGNOSIS — N401 Enlarged prostate with lower urinary tract symptoms: Secondary | ICD-10-CM

## 2023-05-26 ENCOUNTER — Ambulatory Visit (INDEPENDENT_AMBULATORY_CARE_PROVIDER_SITE_OTHER): Payer: Medicare Other | Admitting: Urology

## 2023-05-26 ENCOUNTER — Encounter: Payer: Self-pay | Admitting: Urology

## 2023-05-26 VITALS — BP 137/81 | HR 103 | Ht 67.0 in | Wt 203.0 lb

## 2023-05-26 DIAGNOSIS — Z87440 Personal history of urinary (tract) infections: Secondary | ICD-10-CM

## 2023-05-26 DIAGNOSIS — N401 Enlarged prostate with lower urinary tract symptoms: Secondary | ICD-10-CM

## 2023-05-26 DIAGNOSIS — R351 Nocturia: Secondary | ICD-10-CM

## 2023-05-26 DIAGNOSIS — N3000 Acute cystitis without hematuria: Secondary | ICD-10-CM

## 2023-05-26 DIAGNOSIS — R3912 Poor urinary stream: Secondary | ICD-10-CM

## 2023-05-26 LAB — BLADDER SCAN AMB NON-IMAGING

## 2023-05-26 NOTE — Progress Notes (Signed)
05/26/23 2:34 PM   Philip Robbins 30-Nov-1940 [id number]  CC: UTI, BPH, PSA screening  HPI: 82 year old male referred for the above issues.  He was hospitalized on 03/08/2023 with fever, weakness, diagnosed with UTI and also had a possible seizure, ultimately resulted in shoulder injuries requiring multiple orthopedic procedures.  His urinary symptoms resolved with antibiotics, urine culture grew Klebsiella.  He has been on Flomax long-term which he is unsure if that helps with his stream.  His primary complaint is some occasional weak stream and nocturia 1 time overnight.  He had a renal/bladder ultrasound during his hospitalization that was benign.  PVR today is normal at 23ml.    PMH: Past Medical History:  Diagnosis Date   Arthritis    knee and back   BPH (benign prostatic hypertrophy)    Diabetes type 2, controlled (HCC)    DM type 2 (diabetes mellitus, type 2) (HCC)    Environmental allergies    Headache    sinus headaches   HLD (hyperlipidemia)    Hypothyroid    UTI (urinary tract infection)     Surgical History: Past Surgical History:  Procedure Laterality Date   CARDIAC CATHETERIZATION     CARDIAC CATHETERIZATION     no stents placed   CHOLECYSTECTOMY     KNEE ARTHROSCOPY Right 03/16/2015   Procedure: ARTHROSCOPY KNEE WITH PARTIAL MEDIAL MENISECTOMY, AND CHONDROPLASTY;  Surgeon: Erin Sons, MD;  Location: [facility];  Service: Orthopedics;  Laterality: Right;  Diabetic - oral meds   REVERSE SHOULDER ARTHROPLASTY Left 03/13/2023   Procedure: REVERSE SHOULDER ARTHROPLASTY;  Surgeon: Signa Kell, MD;  Location: [facility];  Service: Orthopedics;  Laterality: Left;   REVERSE SHOULDER ARTHROPLASTY Right 03/17/2023   Procedure: REVERSE SHOULDER ARTHROPLASTY;  Surgeon: Signa Kell, MD;  Location: [facility];  Service: Orthopedics;  Laterality: Right;   REVISION TOTAL SHOULDER TO REVERSE TOTAL SHOULDER Right 05/01/2023   Procedure: REVISION TOTAL SHOULDER TO  REVERSE TOTAL SHOULDER;  Surgeon: Signa Kell, MD;  Location: [facility];  Service: Orthopedics;  Laterality: Right;   SHOULDER CLOSED REDUCTION Right 05/01/2023   Procedure: CLOSED REDUCTION SHOULDER;  Surgeon: Signa Kell, MD;  Location: [facility];  Service: Orthopedics;  Laterality: Right;     Family History: Family History  Problem Relation Age of Onset   Renal Disease Father        ESRD   Diabetes Father    Breast cancer Sister     Social History:  reports that he has never smoked. He has never been exposed to tobacco smoke. He has never used smokeless tobacco. He reports that he does not drink alcohol and does not use drugs.  Physical Exam: BP 137/81   Pulse (!) 103   Ht 5\' 7"  (1.702 m)   Wt 203 lb (92.1 kg)   BMI 31.79 kg/m    Constitutional:  Alert and oriented, No acute distress. Cardiovascular: No clubbing, cyanosis, or edema. Respiratory: Normal respiratory effort, no increased work of breathing. GI: Abdomen is soft, nontender, nondistended, no abdominal masses   Laboratory Data: Reviewed, see HPI  Pertinent Imaging: I have personally viewed and interpreted the renal/bladder ultrasound showing no abnormalities.  Assessment & Plan:   82 year old male with single episode of UTI/prostatitis, complicated by trauma and shoulder injuries requiring multiple Ortho procedures.  Denies any urinary symptoms at this time, renal/bladder ultrasound normal, emptying well with normal PVR of 23ml today.  Recommended adding cranberry tablets for UTI prevention, consider cystoscopy in the future if  recurrent infections.  I also stressed the importance of diabetes control, and the impact of uncontrolled diabetes on both urinary symptoms as well as risk of infection.  I reviewed the hospitalization notes at length.  Follow-up with urology as needed  Philip Rams, MD 05/26/2023  [facility] [address], [address] [place], [place] [address] [phone]

## 2023-05-26 NOTE — Patient Instructions (Signed)

## 2023-05-27 ENCOUNTER — Ambulatory Visit: Payer: Self-pay | Admitting: Urology

## 2023-08-11 ENCOUNTER — Inpatient Hospital Stay (HOSPITAL_BASED_OUTPATIENT_CLINIC_OR_DEPARTMENT_OTHER): Payer: Medicare Other | Admitting: Internal Medicine

## 2023-08-11 ENCOUNTER — Other Ambulatory Visit: Payer: Self-pay

## 2023-08-11 ENCOUNTER — Inpatient Hospital Stay: Payer: Medicare Other | Attending: Internal Medicine

## 2023-08-11 ENCOUNTER — Encounter: Payer: Self-pay | Admitting: Internal Medicine

## 2023-08-11 VITALS — BP 126/60 | HR 66 | Temp 97.3°F | Ht 67.0 in | Wt 208.6 lb

## 2023-08-11 DIAGNOSIS — Z79899 Other long term (current) drug therapy: Secondary | ICD-10-CM | POA: Insufficient documentation

## 2023-08-11 DIAGNOSIS — C9110 Chronic lymphocytic leukemia of B-cell type not having achieved remission: Secondary | ICD-10-CM

## 2023-08-11 DIAGNOSIS — Z803 Family history of malignant neoplasm of breast: Secondary | ICD-10-CM | POA: Diagnosis not present

## 2023-08-11 DIAGNOSIS — E119 Type 2 diabetes mellitus without complications: Secondary | ICD-10-CM | POA: Diagnosis not present

## 2023-08-11 DIAGNOSIS — E871 Hypo-osmolality and hyponatremia: Secondary | ICD-10-CM | POA: Insufficient documentation

## 2023-08-11 DIAGNOSIS — N400 Enlarged prostate without lower urinary tract symptoms: Secondary | ICD-10-CM

## 2023-08-11 LAB — CMP (CANCER CENTER ONLY)
ALT: 17 U/L (ref 0–44)
AST: 22 U/L (ref 15–41)
Albumin: 4.2 g/dL (ref 3.5–5.0)
Alkaline Phosphatase: 61 U/L (ref 38–126)
Anion gap: 11 (ref 5–15)
BUN: 13 mg/dL (ref 8–23)
CO2: 24 mmol/L (ref 22–32)
Calcium: 9.6 mg/dL (ref 8.9–10.3)
Chloride: 100 mmol/L (ref 98–111)
Creatinine: 0.66 mg/dL (ref 0.61–1.24)
GFR, Estimated: >60 mL/min (ref >60)
Glucose, Bld: 135 mg/dL — ABNORMAL HIGH (ref 70–99)
Potassium: 4.1 mmol/L (ref 3.5–5.1)
Sodium: 135 mmol/L (ref 135–145)
Total Bilirubin: 0.5 mg/dL (ref 0.0–1.2)
Total Protein: 7.6 g/dL (ref 6.5–8.1)

## 2023-08-11 LAB — CBC WITH DIFFERENTIAL (CANCER CENTER ONLY)
Abs Immature Granulocytes: 0.09 10*3/uL — ABNORMAL HIGH (ref 0.00–0.07)
Basophils Absolute: 0.1 10*3/uL (ref 0.0–0.1)
Basophils Relative: 1 %
Eosinophils Absolute: 1 10*3/uL — ABNORMAL HIGH (ref 0.0–0.5)
Eosinophils Relative: 4 %
HCT: 42.3 % (ref 39.0–52.0)
Hemoglobin: 13.1 g/dL (ref 13.0–17.0)
Immature Granulocytes: 0 %
Lymphocytes Relative: 64 %
Lymphs Abs: 16 10*3/uL — ABNORMAL HIGH (ref 0.7–4.0)
MCH: 27.6 pg (ref 26.0–34.0)
MCHC: 31 g/dL (ref 30.0–36.0)
MCV: 89.1 fL (ref 80.0–100.0)
Monocytes Absolute: 2.2 10*3/uL — ABNORMAL HIGH (ref 0.1–1.0)
Monocytes Relative: 9 %
Neutro Abs: 5.5 10*3/uL (ref 1.7–7.7)
Neutrophils Relative %: 22 %
Platelet Count: 230 10*3/uL (ref 150–400)
RBC: 4.75 MIL/uL (ref 4.22–5.81)
RDW: 18.3 % — ABNORMAL HIGH (ref 11.5–15.5)
Smear Review: NORMAL
WBC Count: 25 10*3/uL — ABNORMAL HIGH (ref 4.0–10.5)
nRBC: 0 % (ref 0.0–0.2)

## 2023-08-11 LAB — LACTATE DEHYDROGENASE: LDH: 136 U/L (ref 98–192)

## 2023-08-11 LAB — PSA: Prostatic Specific Antigen: 4.7 ng/mL — ABNORMAL HIGH (ref 0.00–4.00)

## 2023-08-11 NOTE — Progress Notes (Signed)
Larey Seat in December due to a medication he was on for pneumonia, No injury.  Had shoulder surgery x3 last fall.

## 2023-08-11 NOTE — Progress Notes (Signed)
[facility] CONSULT NOTE  Patient Care Team: Marina Goodell, MD as PCP - General (Family Medicine) Earna Coder, MD as Consulting Physician (Internal Medicine) Maryjane Hurter Madaline Guthrie, MD (Family Medicine)  CHIEF COMPLAINTS/PURPOSE OF CONSULTATION: CLL  #  Oncology History Overview Note  # CLL [since 2016]-on surveillance.  #    CLL (chronic lymphocytic leukemia) (HCC)  05/11/2015 Initial Diagnosis   CLL (chronic lymphocytic leukemia) (HCC)     HISTORY OF PRESENTING ILLNESS: With his wife.  Ambulating independently.   Philip Robbins 83 y.o.  male history of diabetes obesity and CLL currently on surveillance is here for follow-up.  Patient was treated for UTI in 2024 with levofloxacin which led to seizures.  Patient is in the hospital and discharged.  Patient dislocated his shoulder at that episode.  Recently underwent surgery with Dr. Allena Katz.  More recently patient had a pneumonia in January-outpatient treatment with antibiotics.  This led to dizziness and fall.   Patient denies any unusual joint pains or bone pain.  Denies any weight loss or night sweats.  Denies any early satiety.  Review of Systems  Constitutional:  Positive for malaise/fatigue. Negative for chills, diaphoresis, fever and weight loss.  HENT:  Negative for nosebleeds and sore throat.   Eyes:  Negative for double vision.  Respiratory:  Negative for cough, hemoptysis, sputum production, shortness of breath and wheezing.   Cardiovascular:  Negative for chest pain, palpitations, orthopnea and leg swelling.  Gastrointestinal:  Negative for abdominal pain, blood in stool, constipation, diarrhea, heartburn, melena, nausea and vomiting.  Genitourinary:  Negative for dysuria, frequency and urgency.  Musculoskeletal:  Positive for joint pain. Negative for back pain.  Skin: Negative.  Negative for itching and rash.  Neurological:  Negative for dizziness, tingling, focal weakness, weakness and  headaches.  Endo/Heme/Allergies:  Does not bruise/bleed easily.  Psychiatric/Behavioral:  Negative for depression. The patient is not nervous/anxious and does not have insomnia.      MEDICAL HISTORY:  Past Medical History:  Diagnosis Date   Arthritis    knee and back   BPH (benign prostatic hypertrophy)    Diabetes type 2, controlled (HCC)    DM type 2 (diabetes mellitus, type 2) (HCC)    Environmental allergies    Headache    sinus headaches   HLD (hyperlipidemia)    Hypothyroid    UTI (urinary tract infection)     SURGICAL HISTORY: Past Surgical History:  Procedure Laterality Date   CARDIAC CATHETERIZATION     CARDIAC CATHETERIZATION     no stents placed   CHOLECYSTECTOMY     KNEE ARTHROSCOPY Right 03/16/2015   Procedure: ARTHROSCOPY KNEE WITH PARTIAL MEDIAL MENISECTOMY, AND CHONDROPLASTY;  Surgeon: Erin Sons, MD;  Location: [facility];  Service: Orthopedics;  Laterality: Right;  Diabetic - oral meds   REVERSE SHOULDER ARTHROPLASTY Left 03/13/2023   Procedure: REVERSE SHOULDER ARTHROPLASTY;  Surgeon: Signa Kell, MD;  Location: [facility];  Service: Orthopedics;  Laterality: Left;   REVERSE SHOULDER ARTHROPLASTY Right 03/17/2023   Procedure: REVERSE SHOULDER ARTHROPLASTY;  Surgeon: Signa Kell, MD;  Location: [facility];  Service: Orthopedics;  Laterality: Right;   REVISION TOTAL SHOULDER TO REVERSE TOTAL SHOULDER Right 05/01/2023   Procedure: REVISION TOTAL SHOULDER TO REVERSE TOTAL SHOULDER;  Surgeon: Signa Kell, MD;  Location: [facility];  Service: Orthopedics;  Laterality: Right;   SHOULDER CLOSED REDUCTION Right 05/01/2023   Procedure: CLOSED REDUCTION SHOULDER;  Surgeon: Signa Kell, MD;  Location: [facility];  Service: Orthopedics;  Laterality: Right;    SOCIAL HISTORY: Social History   Socioeconomic History   Marital status: Married    Spouse name: Not on file   Number of children: Not on file   Years of education: Not on file   Highest education  level: Not on file  Occupational History   Occupation: minister  Tobacco Use   Smoking status: Never    Passive exposure: Never   Smokeless tobacco: Never  Vaping Use   Vaping status: Never Used  Substance and Sexual Activity   Alcohol use: No    Alcohol/week: 0.0 standard drinks of alcohol   Drug use: No   Sexual activity: Not on file  Other Topics Concern   Not on file  Social History Narrative   ** Merged History Encounter **       Social Drivers of Health   Financial Resource Strain: Low Risk  (05/06/2023)   Received from [facility]   Overall Financial Resource Strain (CARDIA)    Difficulty of Paying Living Expenses: Not hard at all  Food Insecurity: No Food Insecurity (05/06/2023)   Received from [facility]   Hunger Vital Sign    Worried About Running Out of Food in the Last Year: Never true    Ran Out of Food in the Last Year: Never true  Transportation Needs: No Transportation Needs (05/06/2023)   Received from [facility] - Transportation    In the past 12 months, has lack of transportation kept you from medical appointments or from getting medications?: No    Lack of Transportation (Non-Medical): No  Physical Activity: Not on file  Stress: Not on file  Social Connections: Not on file  Intimate Partner Violence: Not At Risk (04/03/2023)   Humiliation, Afraid, Rape, and Kick questionnaire    Fear of Current or Ex-Partner: No    Emotionally Abused: No    Physically Abused: No    Sexually Abused: No    FAMILY HISTORY: Family History  Problem Relation Age of Onset   Renal Disease Father        ESRD   Diabetes Father    Breast cancer Sister     ALLERGIES:  has no known allergies.  MEDICATIONS:  Current Outpatient Medications  Medication Sig Dispense Refill   acetaminophen (TYLENOL) 500 MG tablet Take 1,000 mg by mouth every 8 (eight) hours as needed for mild pain. Give 2 tablets by mouth  every 8 hours as needed.     ascorbic acid (VITAMIN C) 500 MG tablet Take 1 tablet (500 mg total) by mouth daily. 30 tablet 1   aspirin EC 81 MG tablet Take 81 mg by mouth daily. Swallow whole.     atorvastatin (LIPITOR) 40 MG tablet Take 40 mg by mouth daily.     cetirizine (ZYRTEC) 10 MG tablet Take 10 mg by mouth daily.     fluticasone (FLONASE) 50 MCG/ACT nasal [address] 2 sprays into both nostrils every evening.     folic acid (FOLVITE) 1 MG tablet Take 1 mg by mouth daily.     levothyroxine (SYNTHROID) 75 MCG tablet Take 75 mcg by mouth daily before breakfast.     lisinopril (PRINIVIL,ZESTRIL) 10 MG tablet Take 10 mg by mouth daily.     pioglitazone (ACTOS) 45 MG tablet Take 45 mg by mouth daily.     sitaGLIPtin (JANUVIA) 50 MG tablet Take 50 mg by mouth daily.  sodium chloride 1 g tablet Take 1 g by mouth 2 (two) times daily with a meal. Take half tablet bid     tamsulosin (FLOMAX) 0.4 MG CAPS capsule Take 0.4 mg by mouth at bedtime.     No current facility-administered medications for this visit.    PHYSICAL EXAMINATION: ECOG PERFORMANCE STATUS: 0 - Asymptomatic  Vitals:   08/11/23 1039  BP: 126/60  Pulse: 66  Temp: (!) 97.3 F (36.3 C)  SpO2: 97%    Filed Weights   08/11/23 1039  Weight: 208 lb 9.6 oz (94.6 kg)    Approximately 1 cm lymph node noted in the right axilla; NONE in the left axilla.  Physical Exam Vitals and nursing note reviewed.  Constitutional:      Comments: Alone; ambulating independently.   HENT:     Head: Normocephalic and atraumatic.     Mouth/Throat:     Pharynx: Oropharynx is clear.  Eyes:     Extraocular Movements: Extraocular movements intact.     Pupils: Pupils are equal, round, and reactive to light.  Cardiovascular:     Rate and Rhythm: Normal rate and regular rhythm.  Pulmonary:     Comments: Decreased breath sounds bilaterally.  Abdominal:     Palpations: Abdomen is soft.  Musculoskeletal:        General: Normal range of  motion.     Cervical back: Normal range of motion.  Skin:    General: Skin is warm.  Neurological:     General: No focal deficit present.     Mental Status: He is alert and oriented to person, place, and time.  Psychiatric:        Behavior: Behavior normal.        Judgment: Judgment normal.      LABORATORY DATA:  I have reviewed the data as listed Lab Results  Component Value Date   WBC 25.0 (H) 08/11/2023   HGB 13.1 08/11/2023   HCT 42.3 08/11/2023   MCV 89.1 08/11/2023   PLT 230 08/11/2023   Recent Labs    03/16/23 0437 03/17/23 0314 03/18/23 0801 03/19/23 0416 04/02/23 1707 04/02/23 2049 05/01/23 0635 05/02/23 0522 08/11/23 1024  NA 130* 128* 128*   < > 114*   < > 134* 132* 135  K 3.8 3.7 4.3   < > 4.0   < > 4.4 4.3 4.1  CL 97* 93* 96*   < > 85*   < > 101 102 100  CO2 27 27 24    < > 18*   < > 21* 24 24  GLUCOSE 185* 254* 253*   < > 132*   < > 160* 150* 135*  BUN 16 16 16    < > 7*   < > 11 10 13   CREATININE 0.68 0.74 0.67   < > 0.55*   < > 0.69 0.70 0.66  CALCIUM 8.1* 8.1* 7.9*   < > 8.1*   < > 9.4 8.6* 9.6  GFRNONAA >60 >60 >60   < > >60   < > >60 >60 >60  PROT 5.3* 5.9* 5.4*  --  6.3*  --  7.0  --  7.6  ALBUMIN 2.2* 2.5* 2.3*   < > 3.1*  --  3.8  --  4.2  AST 28 48* 36   < > 29  --  16  --  22  ALT 58* 76* 62*   < > 22  --  13  --  17  ALKPHOS  57 58 56   < > 82  --  62  --  61  BILITOT 0.8 1.0 0.3  --  0.9  --  0.7  --  0.5  BILIDIR 0.3* 0.2 0.2  --   --   --   --   --   --   IBILI 0.5 0.8 0.1*  --   --   --   --   --   --    < > = values in this interval not displayed.    RADIOGRAPHIC STUDIES: I have personally reviewed the radiological images as listed and agreed with the findings in the report. No results found.  ASSESSMENT & PLAN:   CLL (chronic lymphocytic leukemia) (HCC) #  Chronic lymphocytic leukemia: currently on surveillance.v2016-  NUCLEI POSITIVE FOR APPARENT TRISOMY 12   # Today- WBC- 25  [last 3 years 28-30]; Hb/platelet- wnl.  Again  reviewed the potential signs and symptoms of CLL/need for treatment.  Patient is asymptomatic.  Continue surveillance every 6 months. ordered [facility]- pending today.   # Hyponatremia: sodium 135- stable [on salt tablets] Dr. Maryjane Hurter [[email or web address]].  # Incidental CT July 2024- [PCP; pre-syncope]1.9 cm mass in the medial left parotid gland, which could represent a primary parotid neoplasm or an enlarged intraparotid lymph node s/p benign [Dr.bentte]. Awaiting [facility], Dr.Blumberg JAN 2025.   # Cancer screening: [place]- in 2021; none; PSA screening; skin cancer- s/p  [place] [facility]- skin cancer surveillance [awaiting ENT surgery]/ Hx of CLL   # DISPOSITION: # Follow up MD- in 6 months; labs- cbc/cmp/LDH;  quantitative immunoglobulin--Dr.B                  All questions were answered. The patient knows to call the clinic with any problems, questions or concerns.    Earna Coder, MD 08/11/2023 11:55 AM

## 2023-08-11 NOTE — Assessment & Plan Note (Addendum)
#    Chronic lymphocytic leukemia: currently on surveillance.v2016-  NUCLEI POSITIVE FOR APPARENT TRISOMY 12   # Today- WBC- 25  [last 3 years 28-30]; Hb/platelet- wnl.  Again reviewed the potential signs and symptoms of CLL/need for treatment.  Patient is asymptomatic.  Continue surveillance every 6 months. ordered [facility]- pending today.   # Hyponatremia: sodium 135- stable [on salt tablets] Dr. Maryjane Hurter [[email or web address]].  # Incidental CT July 2024- [PCP; pre-syncope]1.9 cm mass in the medial left parotid gland, which could represent a primary parotid neoplasm or an enlarged intraparotid lymph node s/p benign [Dr.bentte]. Awaiting [facility], Dr.Blumberg JAN 2025.   # Cancer screening: [place]- in 2021; none; PSA screening; skin cancer- s/p  [place] [facility]- skin cancer surveillance [awaiting ENT surgery]/ Hx of CLL   # DISPOSITION: # Follow up MD- in 6 months; labs- cbc/cmp/LDH;  quantitative immunoglobulin--Dr.B

## 2023-08-19 LAB — IGVH SOMATIC HYPERMUTATION

## 2024-02-10 ENCOUNTER — Inpatient Hospital Stay (HOSPITAL_BASED_OUTPATIENT_CLINIC_OR_DEPARTMENT_OTHER): Payer: BLUE CROSS/BLUE SHIELD | Admitting: Internal Medicine

## 2024-02-10 ENCOUNTER — Inpatient Hospital Stay: Payer: BLUE CROSS/BLUE SHIELD | Attending: Internal Medicine

## 2024-02-10 ENCOUNTER — Encounter: Payer: Self-pay | Admitting: Internal Medicine

## 2024-02-10 VITALS — BP 104/56 | HR 78 | Temp 96.2°F | Resp 18 | Ht 67.0 in | Wt 212.1 lb

## 2024-02-10 DIAGNOSIS — E871 Hypo-osmolality and hyponatremia: Secondary | ICD-10-CM | POA: Diagnosis not present

## 2024-02-10 DIAGNOSIS — E669 Obesity, unspecified: Secondary | ICD-10-CM | POA: Diagnosis not present

## 2024-02-10 DIAGNOSIS — C9110 Chronic lymphocytic leukemia of B-cell type not having achieved remission: Secondary | ICD-10-CM

## 2024-02-10 DIAGNOSIS — Z803 Family history of malignant neoplasm of breast: Secondary | ICD-10-CM | POA: Diagnosis not present

## 2024-02-10 DIAGNOSIS — E119 Type 2 diabetes mellitus without complications: Secondary | ICD-10-CM | POA: Insufficient documentation

## 2024-02-10 DIAGNOSIS — Z79899 Other long term (current) drug therapy: Secondary | ICD-10-CM | POA: Diagnosis not present

## 2024-02-10 LAB — CBC WITH DIFFERENTIAL (CANCER CENTER ONLY)
Abs Immature Granulocytes: 0.07 K/uL (ref 0.00–0.07)
Basophils Absolute: 0.1 K/uL (ref 0.0–0.1)
Basophils Relative: 0 %
Eosinophils Absolute: 0.6 K/uL — ABNORMAL HIGH (ref 0.0–0.5)
Eosinophils Relative: 2 %
HCT: 42.9 % (ref 39.0–52.0)
Hemoglobin: 13.8 g/dL (ref 13.0–17.0)
Immature Granulocytes: 0 %
Lymphocytes Relative: 70 %
Lymphs Abs: 18.8 K/uL — ABNORMAL HIGH (ref 0.7–4.0)
MCH: 30.4 pg (ref 26.0–34.0)
MCHC: 32.2 g/dL (ref 30.0–36.0)
MCV: 94.5 fL (ref 80.0–100.0)
Monocytes Absolute: 1.6 K/uL — ABNORMAL HIGH (ref 0.1–1.0)
Monocytes Relative: 6 %
Neutro Abs: 6 K/uL (ref 1.7–7.7)
Neutrophils Relative %: 22 %
Platelet Count: 194 K/uL (ref 150–400)
RBC: 4.54 MIL/uL (ref 4.22–5.81)
RDW: 16 % — ABNORMAL HIGH (ref 11.5–15.5)
Smear Review: NORMAL
WBC Count: 27.2 K/uL — ABNORMAL HIGH (ref 4.0–10.5)
nRBC: 0 % (ref 0.0–0.2)

## 2024-02-10 LAB — CMP (CANCER CENTER ONLY)
ALT: 19 U/L (ref 0–44)
AST: 23 U/L (ref 15–41)
Albumin: 4.1 g/dL (ref 3.5–5.0)
Alkaline Phosphatase: 61 U/L (ref 38–126)
Anion gap: 9 (ref 5–15)
BUN: 17 mg/dL (ref 8–23)
CO2: 23 mmol/L (ref 22–32)
Calcium: 9.6 mg/dL (ref 8.9–10.3)
Chloride: 100 mmol/L (ref 98–111)
Creatinine: 0.9 mg/dL (ref 0.61–1.24)
GFR, Estimated: >60 mL/min (ref >60)
Glucose, Bld: 169 mg/dL — ABNORMAL HIGH (ref 70–99)
Potassium: 3.8 mmol/L (ref 3.5–5.1)
Sodium: 132 mmol/L — ABNORMAL LOW (ref 135–145)
Total Bilirubin: 0.7 mg/dL (ref 0.0–1.2)
Total Protein: 7.3 g/dL (ref 6.5–8.1)

## 2024-02-10 LAB — LACTATE DEHYDROGENASE: LDH: 128 U/L (ref 98–192)

## 2024-02-10 NOTE — Progress Notes (Signed)
 Patient states he's doing well with no new or acute concerns at this time.

## 2024-02-10 NOTE — Progress Notes (Signed)
 [facility] CONSULT NOTE  Patient Care Team: Jeffie Cheryl BRAVO, MD as PCP - General (Family Medicine) Rennie Cindy SAUNDERS, MD as Consulting Physician (Oncology) Jeffie Cheryl BRAVO, MD (Family Medicine)  CHIEF COMPLAINTS/PURPOSE OF CONSULTATION: CLL  #  Oncology History Overview Note  # CLL [since 2016]-on surveillance.  #    CLL (chronic lymphocytic leukemia) (HCC)  05/11/2015 Initial Diagnosis   CLL (chronic lymphocytic leukemia) (HCC)     HISTORY OF PRESENTING ILLNESS: With his wife.  Ambulating independently.   Philip Robbins 83 y.o.  male history of diabetes obesity and CLL currently on surveillance is here for follow-up.  Patient clinically doing well.  Denies any new lumps or bumps.  No fever no chills.  No admission to hospitals.   Patient denies any unusual joint pains or bone pain.  Denies any weight loss or night sweats.  Denies any early satiety.  Review of Systems  Constitutional:  Positive for malaise/fatigue. Negative for chills, diaphoresis, fever and weight loss.  HENT:  Negative for nosebleeds and sore throat.   Eyes:  Negative for double vision.  Respiratory:  Negative for cough, hemoptysis, sputum production, shortness of breath and wheezing.   Cardiovascular:  Negative for chest pain, palpitations, orthopnea and leg swelling.  Gastrointestinal:  Negative for abdominal pain, blood in stool, constipation, diarrhea, heartburn, melena, nausea and vomiting.  Genitourinary:  Negative for dysuria, frequency and urgency.  Musculoskeletal:  Positive for joint pain. Negative for back pain.  Skin: Negative.  Negative for itching and rash.  Neurological:  Negative for dizziness, tingling, focal weakness, weakness and headaches.  Endo/Heme/Allergies:  Does not bruise/bleed easily.  Psychiatric/Behavioral:  Negative for depression. The patient is not nervous/anxious and does not have insomnia.      MEDICAL HISTORY:  Past Medical History:   Diagnosis Date   Arthritis    knee and back   BPH (benign prostatic hypertrophy)    Diabetes type 2, controlled (HCC)    DM type 2 (diabetes mellitus, type 2) (HCC)    Environmental allergies    Headache    sinus headaches   HLD (hyperlipidemia)    Hypothyroid    UTI (urinary tract infection)     SURGICAL HISTORY: Past Surgical History:  Procedure Laterality Date   CARDIAC CATHETERIZATION     CARDIAC CATHETERIZATION     no stents placed   CHOLECYSTECTOMY     KNEE ARTHROSCOPY Right 03/16/2015   Procedure: ARTHROSCOPY KNEE WITH PARTIAL MEDIAL MENISECTOMY, AND CHONDROPLASTY;  Surgeon: Helayne Glenn, MD;  Location: [facility];  Service: Orthopedics;  Laterality: Right;  Diabetic - oral meds   REVERSE SHOULDER ARTHROPLASTY Left 03/13/2023   Procedure: REVERSE SHOULDER ARTHROPLASTY;  Surgeon: Tobie Priest, MD;  Location: [facility];  Service: Orthopedics;  Laterality: Left;   REVERSE SHOULDER ARTHROPLASTY Right 03/17/2023   Procedure: REVERSE SHOULDER ARTHROPLASTY;  Surgeon: Tobie Priest, MD;  Location: [facility];  Service: Orthopedics;  Laterality: Right;   REVISION TOTAL SHOULDER TO REVERSE TOTAL SHOULDER Right 05/01/2023   Procedure: REVISION TOTAL SHOULDER TO REVERSE TOTAL SHOULDER;  Surgeon: Tobie Priest, MD;  Location: [facility];  Service: Orthopedics;  Laterality: Right;   SHOULDER CLOSED REDUCTION Right 05/01/2023   Procedure: CLOSED REDUCTION SHOULDER;  Surgeon: Tobie Priest, MD;  Location: [facility];  Service: Orthopedics;  Laterality: Right;    SOCIAL HISTORY: Social History   Socioeconomic History   Marital status: Married    Spouse name: Not on file   Number of children: Not  on file   Years of education: Not on file   Highest education level: Not on file  Occupational History   Occupation: minister  Tobacco Use   Smoking status: Never    Passive exposure: Never   Smokeless tobacco: Never  Vaping Use   Vaping status: Never Used  Substance and Sexual Activity    Alcohol use: No    Alcohol/week: 0.0 standard drinks of alcohol   Drug use: No   Sexual activity: Not on file  Other Topics Concern   Not on file  Social History Narrative   ** Merged History Encounter **       Social Drivers of Health   Financial Resource Strain: Low Risk  (05/06/2023)   Received from [facility]   Overall Financial Resource Strain (CARDIA)    Difficulty of Paying Living Expenses: Not hard at all  Food Insecurity: No Food Insecurity (05/06/2023)   Received from [facility]   Hunger Vital Sign    Within the past 12 months, you worried that your food would run out before you got the money to buy more.: Never true    Within the past 12 months, the food you bought just didn't last and you didn't have money to get more.: Never true  Transportation Needs: No Transportation Needs (05/06/2023)   Received from [facility] - Transportation    In the past 12 months, has lack of transportation kept you from medical appointments or from getting medications?: No    Lack of Transportation (Non-Medical): No  Physical Activity: Not on file  Stress: Not on file  Social Connections: Not on file  Intimate Partner Violence: Not At Risk (04/03/2023)   Humiliation, Afraid, Rape, and Kick questionnaire    Fear of Current or Ex-Partner: No    Emotionally Abused: No    Physically Abused: No    Sexually Abused: No    FAMILY HISTORY: Family History  Problem Relation Age of Onset   Renal Disease Father        ESRD   Diabetes Father    Breast cancer Sister     ALLERGIES:  is allergic to gramineae pollens.  MEDICATIONS:  Current Outpatient Medications  Medication Sig Dispense Refill   acetaminophen  (TYLENOL ) 500 MG tablet Take 1,000 mg by mouth every 8 (eight) hours as needed for mild pain. Give 2 tablets by mouth every 8 hours as needed.     amLODipine  (NORVASC ) 5 MG tablet Take by mouth. (Patient not taking:  Reported on 02/10/2024)     ascorbic acid  (VITAMIN C ) 500 MG tablet Take 1 tablet (500 mg total) by mouth daily. (Patient not taking: Reported on 02/10/2024) 30 tablet 1   aspirin  EC 81 MG tablet Take 81 mg by mouth daily. Swallow whole.     atorvastatin  (LIPITOR) 40 MG tablet Take 40 mg by mouth daily.     bisacodyl  (DULCOLAX) 10 MG suppository Place 10 mg rectally.     cetirizine (ZYRTEC) 10 MG tablet Take 10 mg by mouth daily.     fluticasone  (FLONASE ) 50 MCG/ACT nasal [place] 2 sprays into both nostrils every evening.     folic acid  (FOLVITE ) 1 MG tablet Take 1 mg by mouth daily. (Patient not taking: Reported on 02/10/2024)     guaiFENesin  (MUCINEX ) 600 MG 12 hr tablet Take 600 mg by mouth.     levothyroxine  (SYNTHROID ) 75 MCG tablet Take 75 mcg by mouth daily before breakfast.  lisinopril  (PRINIVIL ,ZESTRIL ) 10 MG tablet Take 10 mg by mouth daily.     melatonin 3 MG TABS tablet Take 3 mg by mouth. (Patient not taking: Reported on 02/10/2024)     Multiple Vitamin (MULTI-VITAMIN) tablet Take 1 tablet by mouth daily.     pioglitazone  (ACTOS ) 45 MG tablet Take 45 mg by mouth daily.     sitaGLIPtin (JANUVIA) 50 MG tablet Take 50 mg by mouth daily.     sodium chloride  1 g tablet Take 1 g by mouth 2 (two) times daily with a meal. Take half tablet bid     tamsulosin  (FLOMAX ) 0.4 MG CAPS capsule Take 0.4 mg by mouth at bedtime.     No current facility-administered medications for this visit.    PHYSICAL EXAMINATION: ECOG PERFORMANCE STATUS: 0 - Asymptomatic  Vitals:   02/10/24 1055  BP: (!) 104/56  Pulse: 78  Resp: 18  Temp: (!) 96.2 F (35.7 C)  SpO2: 100%    Filed Weights   02/10/24 1055  Weight: 212 lb 1.6 oz (96.2 kg)    Approximately 1 cm lymph node noted in the right axilla; NONE in the left axilla.  Physical Exam Vitals and nursing note reviewed.  Constitutional:      Comments: Alone; ambulating independently.   HENT:     Head: Normocephalic and atraumatic.      Mouth/Throat:     Pharynx: Oropharynx is clear.  Eyes:     Extraocular Movements: Extraocular movements intact.     Pupils: Pupils are equal, round, and reactive to light.  Cardiovascular:     Rate and Rhythm: Normal rate and regular rhythm.  Pulmonary:     Comments: Decreased breath sounds bilaterally.  Abdominal:     Palpations: Abdomen is soft.  Musculoskeletal:        General: Normal range of motion.     Cervical back: Normal range of motion.  Skin:    General: Skin is warm.  Neurological:     General: No focal deficit present.     Mental Status: He is alert and oriented to person, place, and time.  Psychiatric:        Behavior: Behavior normal.        Judgment: Judgment normal.      LABORATORY DATA:  I have reviewed the data as listed Lab Results  Component Value Date   WBC 27.2 (H) 02/10/2024   HGB 13.8 02/10/2024   HCT 42.9 02/10/2024   MCV 94.5 02/10/2024   PLT 194 02/10/2024   Recent Labs    03/16/23 0437 03/17/23 0314 03/18/23 0801 03/19/23 0416 05/01/23 0635 05/02/23 0522 08/11/23 1024 02/10/24 1030  NA 130* 128* 128*   < > 134* 132* 135 132*  K 3.8 3.7 4.3   < > 4.4 4.3 4.1 3.8  CL 97* 93* 96*   < > 101 102 100 100  CO2 27 27 24    < > 21* 24 24 23   GLUCOSE 185* 254* 253*   < > 160* 150* 135* 169*  BUN 16 16 16    < > 11 10 13 17   CREATININE 0.68 0.74 0.67   < > 0.69 0.70 0.66 0.90  CALCIUM  8.1* 8.1* 7.9*   < > 9.4 8.6* 9.6 9.6  GFRNONAA >60 >60 >60   < > >60 >60 >60 >60  PROT 5.3* 5.9* 5.4*   < > 7.0  --  7.6 7.3  ALBUMIN 2.2* 2.5* 2.3*   < > 3.8  --  4.2 4.1  AST 28 48* 36   < > 16  --  22 23  ALT 58* 76* 62*   < > 13  --  17 19  ALKPHOS 57 58 56   < > 62  --  61 61  BILITOT 0.8 1.0 0.3   < > 0.7  --  0.5 0.7  BILIDIR 0.3* 0.2 0.2  --   --   --   --   --   IBILI 0.5 0.8 0.1*  --   --   --   --   --    < > = values in this interval not displayed.    RADIOGRAPHIC STUDIES: I have personally reviewed the radiological images as listed and  agreed with the findings in the report. No results found.  ASSESSMENT & PLAN:   CLL (chronic lymphocytic leukemia) (HCC) #  Chronic lymphocytic leukemia: currently on surveillance.v2016-  NUCLEI POSITIVE FOR APPARENT TRISOMY 12   # Today- WBC- 28 [last 3 years 28-30]; Hb/platelet- wnl.  Again reviewed the potential signs and symptoms of CLL/need for treatment.  Patient is asymptomatic.  Continue surveillance every 6 months. ordered [facility]- pending today.   # Hyponatremia: sodium 135- stable [on salt tablets] Dr. Jeffie [[email or web address]].  # Cancer screening: [place]- in 2021; none; PSA screening; skin cancer- s/p  awaiting [facility] dermatology- skin cancer surveillance  Hx of CLL  # DISPOSITION: # Follow up MD- in 6 months; labs- cbc/cmp/LDH- Dr.B                  All questions were answered. The patient knows to call the clinic with any problems, questions or concerns.    Cindy JONELLE Joe, MD 02/10/2024 11:31 AM

## 2024-02-10 NOTE — Addendum Note (Signed)
 Addended by: LAEL BROWNING A on: 02/10/2024 11:42 AM   Modules accepted: Orders

## 2024-02-10 NOTE — Assessment & Plan Note (Addendum)
#    Chronic lymphocytic leukemia: currently on surveillance.v2016-  NUCLEI POSITIVE FOR APPARENT TRISOMY 12   # Today- WBC- 28 [last 3 years 28-30]; Hb/platelet- wnl.  Again reviewed the potential signs and symptoms of CLL/need for treatment.  Patient is asymptomatic.  Continue surveillance every 6 months. ordered [facility]- pending today.   # Hyponatremia: sodium 135- stable [on salt tablets] Dr. Jeffie [[email or web address]].  # Cancer screening: [place]- in 2021; none; PSA screening; skin cancer- s/p  awaiting [facility] dermatology- skin cancer surveillance  Hx of CLL  # DISPOSITION: # Follow up MD- in 6 months; labs- cbc/cmp/LDH- Dr.B

## 2024-02-13 LAB — IMMUNOGLOBULINS A/E/G/M, SERUM
IgA: 91 mg/dL (ref 61–437)
IgE (Immunoglobulin E), Serum: 50 [IU]/mL (ref 6–495)
IgG (Immunoglobin G), Serum: 1127 mg/dL (ref 603–1613)
IgM (Immunoglobulin M), Srm: 35 mg/dL (ref 15–143)

## 2024-02-23 LAB — IGVH SOMATIC HYPERMUTATION

## 2024-08-12 ENCOUNTER — Encounter: Payer: Self-pay | Admitting: Internal Medicine

## 2024-08-12 ENCOUNTER — Inpatient Hospital Stay: Attending: Internal Medicine

## 2024-08-12 ENCOUNTER — Inpatient Hospital Stay: Admitting: Internal Medicine

## 2024-08-12 VITALS — BP 113/39 | HR 90 | Temp 97.0°F | Resp 12 | Ht 67.0 in | Wt 225.1 lb

## 2024-08-12 DIAGNOSIS — C9110 Chronic lymphocytic leukemia of B-cell type not having achieved remission: Secondary | ICD-10-CM | POA: Diagnosis not present

## 2024-08-12 LAB — CBC WITH DIFFERENTIAL (CANCER CENTER ONLY)
Abs Immature Granulocytes: 0.13 10*3/uL — ABNORMAL HIGH (ref 0.00–0.07)
Basophils Absolute: 0.2 10*3/uL — ABNORMAL HIGH (ref 0.0–0.1)
Basophils Relative: 1 %
Eosinophils Absolute: 0.4 10*3/uL (ref 0.0–0.5)
Eosinophils Relative: 2 %
HCT: 41.5 % (ref 39.0–52.0)
Hemoglobin: 12.9 g/dL — ABNORMAL LOW (ref 13.0–17.0)
Immature Granulocytes: 0 %
Lymphocytes Relative: 67 %
Lymphs Abs: 20.1 10*3/uL — ABNORMAL HIGH (ref 0.7–4.0)
MCH: 30.2 pg (ref 26.0–34.0)
MCHC: 31.1 g/dL (ref 30.0–36.0)
MCV: 97.2 fL (ref 80.0–100.0)
Monocytes Absolute: 2.3 10*3/uL — ABNORMAL HIGH (ref 0.1–1.0)
Monocytes Relative: 8 %
Neutro Abs: 6.6 10*3/uL (ref 1.7–7.7)
Neutrophils Relative %: 22 %
Platelet Count: 284 10*3/uL (ref 150–400)
RBC: 4.27 MIL/uL (ref 4.22–5.81)
RDW: 15.7 % — ABNORMAL HIGH (ref 11.5–15.5)
Smear Review: NORMAL
WBC Count: 29.7 10*3/uL — ABNORMAL HIGH (ref 4.0–10.5)
nRBC: 0 % (ref 0.0–0.2)

## 2024-08-12 LAB — CMP (CANCER CENTER ONLY)
ALT: 15 U/L (ref 0–44)
AST: 23 U/L (ref 15–41)
Albumin: 3.9 g/dL (ref 3.5–5.0)
Alkaline Phosphatase: 67 U/L (ref 38–126)
Anion gap: 10 (ref 5–15)
BUN: 17 mg/dL (ref 8–23)
CO2: 22 mmol/L (ref 22–32)
Calcium: 9.8 mg/dL (ref 8.9–10.3)
Chloride: 106 mmol/L (ref 98–111)
Creatinine: 0.89 mg/dL (ref 0.61–1.24)
GFR, Estimated: >60 mL/min
Glucose, Bld: 201 mg/dL — ABNORMAL HIGH (ref 70–99)
Potassium: 4.4 mmol/L (ref 3.5–5.1)
Sodium: 138 mmol/L (ref 135–145)
Total Bilirubin: 0.4 mg/dL (ref 0.0–1.2)
Total Protein: 6.9 g/dL (ref 6.5–8.1)

## 2024-08-12 LAB — LACTATE DEHYDROGENASE: LDH: 281 U/L — ABNORMAL HIGH (ref 105–235)

## 2024-08-12 NOTE — Progress Notes (Signed)
 [facility] CONSULT NOTE  Patient Care Team: Jeffie Cheryl BRAVO, MD as PCP - General (Family Medicine) Rennie Cindy SAUNDERS, MD as Consulting Physician (Oncology) Jeffie Cheryl BRAVO, MD (Family Medicine)  CHIEF COMPLAINTS/PURPOSE OF CONSULTATION: CLL  #  Oncology History Overview Note  # CLL [since 2016]-on surveillance.  #    CLL (chronic lymphocytic leukemia) (HCC)  05/11/2015 Initial Diagnosis   CLL (chronic lymphocytic leukemia) (HCC)     HISTORY OF PRESENTING ILLNESS: With his wife.  Ambulating independently.   Philip Robbins 84 y.o.  male history of diabetes obesity and CLL currently on surveillance is here for follow-up.  Discussed the use of AI scribe software for clinical note transcription with the patient, who gave verbal consent to proceed.  History of Present Illness   Philip Robbins is an 84 year old male with chronic lymphocytic leukemia (CLL), B-cell type, who presents for routine surveillance to monitor disease stability.  He is currently asymptomatic and denies new symptoms or concerns since his last visit. He reports no B symptoms, including fevers, night sweats, or unintentional weight loss. Recent laboratory results show that his white blood cell count has been consistently around 29-30. He recalls a prior episode of acute illness a few years ago when his counts increased to 60 during hospitalization.  He inquired about his sodium and calcium  levels, noting that his sodium, previously low at 132, is now within normal limits at 138. Calcium  is also within normal range. His blood glucose today was 200, which was discussed in the context of having eaten breakfast prior to the test.  He continues dermatology follow-up and recently received a favorable report, with instructions to return in two years unless new issues arise.      Review of Systems  Constitutional:  Positive for malaise/fatigue. Negative for chills, diaphoresis, fever and weight  loss.  HENT:  Negative for nosebleeds and sore throat.   Eyes:  Negative for double vision.  Respiratory:  Negative for cough, hemoptysis, sputum production, shortness of breath and wheezing.   Cardiovascular:  Negative for chest pain, palpitations, orthopnea and leg swelling.  Gastrointestinal:  Negative for abdominal pain, blood in stool, constipation, diarrhea, heartburn, melena, nausea and vomiting.  Genitourinary:  Negative for dysuria, frequency and urgency.  Musculoskeletal:  Positive for joint pain. Negative for back pain.  Skin: Negative.  Negative for itching and rash.  Neurological:  Negative for dizziness, tingling, focal weakness, weakness and headaches.  Endo/Heme/Allergies:  Does not bruise/bleed easily.  Psychiatric/Behavioral:  Negative for depression. The patient is not nervous/anxious and does not have insomnia.      MEDICAL HISTORY:  Past Medical History:  Diagnosis Date   Arthritis    knee and back   BPH (benign prostatic hypertrophy)    Diabetes type 2, controlled (HCC)    DM type 2 (diabetes mellitus, type 2) (HCC)    Environmental allergies    Headache    sinus headaches   HLD (hyperlipidemia)    Hypothyroid    UTI (urinary tract infection)     SURGICAL HISTORY: Past Surgical History:  Procedure Laterality Date   CARDIAC CATHETERIZATION     CARDIAC CATHETERIZATION     no stents placed   CHOLECYSTECTOMY     KNEE ARTHROSCOPY Right 03/16/2015   Procedure: ARTHROSCOPY KNEE WITH PARTIAL MEDIAL MENISECTOMY, AND CHONDROPLASTY;  Surgeon: Helayne Glenn, MD;  Location: [facility];  Service: Orthopedics;  Laterality: Right;  Diabetic - oral meds   REVERSE SHOULDER ARTHROPLASTY Left  03/13/2023   Procedure: REVERSE SHOULDER ARTHROPLASTY;  Surgeon: Tobie Priest, MD;  Location: [facility];  Service: Orthopedics;  Laterality: Left;   REVERSE SHOULDER ARTHROPLASTY Right 03/17/2023   Procedure: REVERSE SHOULDER ARTHROPLASTY;  Surgeon: Tobie Priest, MD;  Location:  [facility];  Service: Orthopedics;  Laterality: Right;   REVISION TOTAL SHOULDER TO REVERSE TOTAL SHOULDER Right 05/01/2023   Procedure: REVISION TOTAL SHOULDER TO REVERSE TOTAL SHOULDER;  Surgeon: Tobie Priest, MD;  Location: [facility];  Service: Orthopedics;  Laterality: Right;   SHOULDER CLOSED REDUCTION Right 05/01/2023   Procedure: CLOSED REDUCTION SHOULDER;  Surgeon: Tobie Priest, MD;  Location: [facility];  Service: Orthopedics;  Laterality: Right;    SOCIAL HISTORY: Social History   Socioeconomic History   Marital status: Married    Spouse name: Not on file   Number of children: Not on file   Years of education: Not on file   Highest education level: Not on file  Occupational History   Occupation: minister  Tobacco Use   Smoking status: Never    Passive exposure: Never   Smokeless tobacco: Never  Vaping Use   Vaping status: Never Used  Substance and Sexual Activity   Alcohol use: No    Alcohol/week: 0.0 standard drinks of alcohol   Drug use: No   Sexual activity: Not on file  Other Topics Concern   Not on file  Social History Narrative   ** Merged History Encounter **       Social Drivers of Health   Tobacco Use: Low Risk (08/12/2024)   Patient History    Smoking Tobacco Use: Never    Smokeless Tobacco Use: Never    Passive Exposure: Never  Financial Resource Strain: Low Risk  (05/06/2023)   Received from [facility]   Overall Financial Resource Strain (CARDIA)    Difficulty of Paying Living Expenses: Not hard at all  Food Insecurity: No Food Insecurity (05/06/2023)   Received from [facility]   Epic    Within the past 12 months, you worried that your food would run out before you got the money to buy more.: Never true    Within the past 12 months, the food you bought just didn't last and you didn't have money to get more.: Never true  Transportation Needs: No Transportation Needs (05/06/2023)   Received from [facility] - Transportation    In the past 12 months, has lack of transportation kept you from medical appointments or from getting medications?: No    Lack of Transportation (Non-Medical): No  Physical Activity: Not on file  Stress: Not on file  Social Connections: Not on file  Intimate Partner Violence: Not At Risk (04/03/2023)   Humiliation, Afraid, Rape, and Kick questionnaire    Fear of Current or Ex-Partner: No    Emotionally Abused: No    Physically Abused: No    Sexually Abused: No  Depression (PHQ2-9): Low Risk (08/12/2024)   Depression (PHQ2-9)    PHQ-2 Score: 0  Alcohol Screen: Not on file  Housing: Unknown (07/09/2024)   Received from [facility]   Epic    In the last 12 months, was there a time when you were not able to pay the mortgage or rent on time?: No    Number of Times Moved in the Last Year: Not on file    At any time in the past 12 months, were you homeless or living in a  shelter (including now)?: No  Utilities: Not At Risk (04/20/2023)   Received from [facility] Utilities    Threatened with loss of utilities: No  Health Literacy: Not on file    FAMILY HISTORY: Family History  Problem Relation Age of Onset   Renal Disease Father        ESRD   Diabetes Father    Breast cancer Sister     ALLERGIES:  is allergic to gramineae pollens.  MEDICATIONS:  Current Outpatient Medications  Medication Sig Dispense Refill   acetaminophen  (TYLENOL ) 500 MG tablet Take 1,000 mg by mouth every 8 (eight) hours as needed for mild pain. Give 2 tablets by mouth every 8 hours as needed.     aspirin  EC 81 MG tablet Take 81 mg by mouth daily. Swallow whole.     atorvastatin  (LIPITOR) 40 MG tablet Take 40 mg by mouth daily.     bisacodyl  (DULCOLAX) 10 MG suppository Place 10 mg rectally.     cetirizine (ZYRTEC) 10 MG tablet Take 10 mg by mouth daily.     fluticasone  (FLONASE ) 50 MCG/ACT nasal Boomer Place 2 sprays into  both nostrils every evening.     guaiFENesin  (MUCINEX ) 600 MG 12 hr tablet Take 600 mg by mouth.     levothyroxine  (SYNTHROID ) 75 MCG tablet Take 75 mcg by mouth daily before breakfast.     lisinopril  (PRINIVIL ,ZESTRIL ) 10 MG tablet Take 10 mg by mouth daily.     Multiple Vitamin (MULTI-VITAMIN) tablet Take 1 tablet by mouth daily.     pioglitazone  (ACTOS ) 45 MG tablet Take 45 mg by mouth daily.     sitaGLIPtin (JANUVIA) 50 MG tablet Take 50 mg by mouth daily.     sodium chloride  1 g tablet Take 1 g by mouth 2 (two) times daily with a meal. Take half tablet bid     tamsulosin  (FLOMAX ) 0.4 MG CAPS capsule Take 0.4 mg by mouth at bedtime.     No current facility-administered medications for this visit.    PHYSICAL EXAMINATION: ECOG PERFORMANCE STATUS: 0 - Asymptomatic  Vitals:   08/12/24 0948  BP: (!) 113/39  Pulse: 90  Resp: 12  Temp: (!) 97 F (36.1 C)  SpO2: 98%    Filed Weights   08/12/24 0948  Weight: 225 lb 1.6 oz (102.1 kg)    Approximately 1 cm lymph node noted in the right axilla; NONE in the left axilla.  Physical Exam Vitals and nursing note reviewed.  Constitutional:      Comments: Alone; ambulating independently.   HENT:     Head: Normocephalic and atraumatic.     Mouth/Throat:     Pharynx: Oropharynx is clear.  Eyes:     Extraocular Movements: Extraocular movements intact.     Pupils: Pupils are equal, round, and reactive to light.  Cardiovascular:     Rate and Rhythm: Normal rate and regular rhythm.  Pulmonary:     Comments: Decreased breath sounds bilaterally.  Abdominal:     Palpations: Abdomen is soft.  Musculoskeletal:        General: Normal range of motion.     Cervical back: Normal range of motion.  Skin:    General: Skin is warm.  Neurological:     General: No focal deficit present.     Mental Status: He is alert and oriented to person, place, and time.  Psychiatric:        Behavior: Behavior normal.  Judgment: Judgment normal.       LABORATORY DATA:  I have reviewed the data as listed Lab Results  Component Value Date   WBC 29.7 (H) 08/12/2024   HGB 12.9 (L) 08/12/2024   HCT 41.5 08/12/2024   MCV 97.2 08/12/2024   PLT 284 08/12/2024   Recent Labs    02/10/24 1030 08/12/24 0951  NA 132* 138  K 3.8 4.4  CL 100 106  CO2 23 22  GLUCOSE 169* 201*  BUN 17 17  CREATININE 0.90 0.89  CALCIUM  9.6 9.8  GFRNONAA >60 >60  PROT 7.3 6.9  ALBUMIN 4.1 3.9  AST 23 23  ALT 19 15  ALKPHOS 61 67  BILITOT 0.7 0.4    RADIOGRAPHIC STUDIES: I have personally reviewed the radiological images as listed and agreed with the findings in the report. No results found.  ASSESSMENT & PLAN:   CLL (chronic lymphocytic leukemia) (HCC) #  Chronic lymphocytic leukemia: currently on surveillance.v2016-  NUCLEI POSITIVE FOR APPARENT TRISOMY 12; JULY 2025-IGVH-UNMUTATED  # Today- WBC- 29 [last 3 years 28-30]; Hb/platelet- wnl.  Again reviewed the potential signs and symptoms of CLL/need for treatment.  Patient is asymptomatic.  Continue surveillance every 6 months.    # Hyponatremia: sodium 135- stable [on salt tablets] Dr. Jeffie [july2022]-stable.   # Cancer screening: [place]- in 2021; none; PSA screening; skin cancer- s/p  awaiting [facility] dermatology- skin cancer surveillance  Hx of CLL- stable.   # DISPOSITION: # Follow up MD- in 6 months; labs- cbc/cmp/LDH- Dr.B                   All questions were answered. The patient knows to call the clinic with any problems, questions or concerns.    Cindy JONELLE Joe, MD 08/12/2024 10:58 AM

## 2024-08-12 NOTE — Progress Notes (Signed)
 No concerns today.

## 2024-08-12 NOTE — Assessment & Plan Note (Signed)
#    Chronic lymphocytic leukemia: currently on surveillance.v2016-  NUCLEI POSITIVE FOR APPARENT TRISOMY 12; JULY 2025-IGVH-UNMUTATED  # Today- WBC- 29 [last 3 years 28-30]; Hb/platelet- wnl.  Again reviewed the potential signs and symptoms of CLL/need for treatment.  Patient is asymptomatic.  Continue surveillance every 6 months.    # Hyponatremia: sodium 135- stable [on salt tablets] Dr. Jeffie [july2022]-stable.   # Cancer screening: [place]- in 2021; none; PSA screening; skin cancer- s/p  awaiting [facility] dermatology- skin cancer surveillance  Hx of CLL- stable.   # DISPOSITION: # Follow up MD- in 6 months; labs- cbc/cmp/LDH- Dr.B

## 2025-02-09 ENCOUNTER — Inpatient Hospital Stay: Admitting: Internal Medicine

## 2025-02-09 ENCOUNTER — Inpatient Hospital Stay
